# Patient Record
Sex: Female | Born: 1950
Health system: Southern US, Community
[De-identification: ages and names within clinical notes are randomized; demographics above are authoritative.]

## PROBLEM LIST (undated history)

## (undated) DIAGNOSIS — R319 Hematuria, unspecified: Secondary | ICD-10-CM

## (undated) DIAGNOSIS — R102 Pelvic and perineal pain: Secondary | ICD-10-CM

## (undated) DIAGNOSIS — L0100 Impetigo, unspecified: Secondary | ICD-10-CM

## (undated) DIAGNOSIS — M069 Rheumatoid arthritis, unspecified: Secondary | ICD-10-CM

## (undated) DIAGNOSIS — R1310 Dysphagia, unspecified: Secondary | ICD-10-CM

## (undated) DIAGNOSIS — F419 Anxiety disorder, unspecified: Secondary | ICD-10-CM

## (undated) DIAGNOSIS — R609 Edema, unspecified: Secondary | ICD-10-CM

## (undated) DIAGNOSIS — F329 Major depressive disorder, single episode, unspecified: Secondary | ICD-10-CM

## (undated) DIAGNOSIS — K5900 Constipation, unspecified: Secondary | ICD-10-CM

## (undated) DIAGNOSIS — I639 Cerebral infarction, unspecified: Secondary | ICD-10-CM

## (undated) DIAGNOSIS — F32A Depression, unspecified: Secondary | ICD-10-CM

## (undated) DIAGNOSIS — M1990 Unspecified osteoarthritis, unspecified site: Secondary | ICD-10-CM

## (undated) DIAGNOSIS — I10 Essential (primary) hypertension: Secondary | ICD-10-CM

## (undated) DIAGNOSIS — J309 Allergic rhinitis, unspecified: Secondary | ICD-10-CM

## (undated) DIAGNOSIS — J45909 Unspecified asthma, uncomplicated: Secondary | ICD-10-CM

## (undated) DIAGNOSIS — E785 Hyperlipidemia, unspecified: Secondary | ICD-10-CM

## (undated) DIAGNOSIS — K829 Disease of gallbladder, unspecified: Secondary | ICD-10-CM

## (undated) DIAGNOSIS — E669 Obesity, unspecified: Secondary | ICD-10-CM

## (undated) DIAGNOSIS — T7840XA Allergy, unspecified, initial encounter: Secondary | ICD-10-CM

## (undated) HISTORY — DX: Unspecified osteoarthritis, unspecified site: M19.90

## (undated) HISTORY — DX: Constipation, unspecified: K59.00

## (undated) HISTORY — PX: JOINT REPLACEMENT: SHX530

## (undated) HISTORY — DX: Edema, unspecified: R60.9

## (undated) HISTORY — DX: Disease of gallbladder, unspecified: K82.9

## (undated) HISTORY — DX: Allergy, unspecified, initial encounter: T78.40XA

## (undated) HISTORY — DX: Impetigo, unspecified: L01.00

## (undated) HISTORY — DX: Hyperlipidemia, unspecified: E78.5

## (undated) HISTORY — PX: BACK SURGERY: SHX140

## (undated) HISTORY — DX: Rheumatoid arthritis, unspecified: M06.9

## (undated) HISTORY — PX: TONSILLECTOMY AND ADENOIDECTOMY: SUR1326

## (undated) HISTORY — DX: Cerebral infarction, unspecified: I63.9

## (undated) HISTORY — DX: Major depressive disorder, single episode, unspecified: F32.9

## (undated) HISTORY — PX: LIPOMA EXCISION: SHX5283

## (undated) HISTORY — DX: Allergic rhinitis, unspecified: J30.9

## (undated) HISTORY — DX: Obesity, unspecified: E66.9

## (undated) HISTORY — PX: EYE SURGERY: SHX253

## (undated) HISTORY — PX: CHOLECYSTECTOMY: SHX55

## (undated) HISTORY — DX: Unspecified asthma, uncomplicated: J45.909

## (undated) HISTORY — DX: Essential (primary) hypertension: I10

## (undated) HISTORY — DX: Pelvic and perineal pain: R10.2

## (undated) HISTORY — PX: SPINE SURGERY: SHX786

## (undated) HISTORY — PX: KNEE ARTHROPLASTY: SHX992

## (undated) HISTORY — DX: Dysphagia, unspecified: R13.10

## (undated) HISTORY — DX: Hematuria, unspecified: R31.9

## (undated) HISTORY — DX: Depression, unspecified: F32.A

## (undated) HISTORY — PX: KNEE SURGERY: SHX244

## (undated) HISTORY — DX: Anxiety disorder, unspecified: F41.9

---

## 2005-08-21 HISTORY — PX: COLONOSCOPY: SHX174

## 2007-02-21 ENCOUNTER — Ambulatory Visit: Payer: Self-pay | Admitting: Family Medicine

## 2007-02-21 DIAGNOSIS — J309 Allergic rhinitis, unspecified: Secondary | ICD-10-CM

## 2007-02-21 DIAGNOSIS — E785 Hyperlipidemia, unspecified: Secondary | ICD-10-CM | POA: Insufficient documentation

## 2007-02-21 DIAGNOSIS — M25569 Pain in unspecified knee: Secondary | ICD-10-CM | POA: Insufficient documentation

## 2007-02-21 DIAGNOSIS — D179 Benign lipomatous neoplasm, unspecified: Secondary | ICD-10-CM | POA: Insufficient documentation

## 2007-02-21 DIAGNOSIS — M545 Low back pain, unspecified: Secondary | ICD-10-CM | POA: Insufficient documentation

## 2007-02-21 DIAGNOSIS — J45909 Unspecified asthma, uncomplicated: Secondary | ICD-10-CM | POA: Insufficient documentation

## 2007-02-21 DIAGNOSIS — M129 Arthropathy, unspecified: Secondary | ICD-10-CM | POA: Insufficient documentation

## 2007-02-25 ENCOUNTER — Telehealth (INDEPENDENT_AMBULATORY_CARE_PROVIDER_SITE_OTHER): Payer: Self-pay | Admitting: *Deleted

## 2007-02-28 ENCOUNTER — Ambulatory Visit (HOSPITAL_COMMUNITY): Admission: RE | Admit: 2007-02-28 | Discharge: 2007-02-28 | Payer: Self-pay | Admitting: Family Medicine

## 2007-03-01 ENCOUNTER — Telehealth (INDEPENDENT_AMBULATORY_CARE_PROVIDER_SITE_OTHER): Payer: Self-pay | Admitting: *Deleted

## 2007-03-07 ENCOUNTER — Ambulatory Visit: Payer: Self-pay | Admitting: Family Medicine

## 2007-03-07 DIAGNOSIS — M159 Polyosteoarthritis, unspecified: Secondary | ICD-10-CM

## 2007-03-07 DIAGNOSIS — R439 Unspecified disturbances of smell and taste: Secondary | ICD-10-CM

## 2007-03-07 DIAGNOSIS — L409 Psoriasis, unspecified: Secondary | ICD-10-CM | POA: Insufficient documentation

## 2007-03-08 ENCOUNTER — Encounter (INDEPENDENT_AMBULATORY_CARE_PROVIDER_SITE_OTHER): Payer: Self-pay | Admitting: Family Medicine

## 2007-03-10 LAB — CONVERTED CEMR LAB
ALT: 20 units/L (ref 0–35)
Albumin: 4.1 g/dL (ref 3.5–5.2)
BUN: 12 mg/dL (ref 6–23)
CRP: 0.6 mg/dL — ABNORMAL HIGH (ref <0.6)
Calcium: 10.3 mg/dL (ref 8.4–10.5)
Chloride: 108 meq/L (ref 96–112)
Cholesterol: 291 mg/dL — ABNORMAL HIGH (ref 0–200)
Eosinophils Relative: 3 % (ref 0–5)
Glucose, Bld: 81 mg/dL (ref 70–99)
HCT: 43.9 % (ref 36.0–46.0)
Hemoglobin: 14.4 g/dL (ref 12.0–15.0)
LDL Cholesterol: 232 mg/dL — ABNORMAL HIGH (ref 0–99)
Lymphocytes Relative: 26 % (ref 12–46)
MCV: 90.3 fL (ref 78.0–100.0)
Monocytes Absolute: 0.4 10*3/uL (ref 0.2–0.7)
Neutro Abs: 3.5 10*3/uL (ref 1.7–7.7)
Platelets: 265 10*3/uL (ref 150–400)
Potassium: 4.5 meq/L (ref 3.5–5.3)
RBC: 4.86 M/uL (ref 3.87–5.11)
Rhuematoid fact SerPl-aCnc: 410 intl units/mL — ABNORMAL HIGH (ref 0–20)
Sodium: 141 meq/L (ref 135–145)
TSH: 1.613 microintl units/mL (ref 0.350–5.50)
Total CHOL/HDL Ratio: 6.9
Triglycerides: 86 mg/dL (ref <150)
WBC: 5.6 10*3/uL (ref 4.0–10.5)

## 2007-03-11 ENCOUNTER — Telehealth (INDEPENDENT_AMBULATORY_CARE_PROVIDER_SITE_OTHER): Payer: Self-pay | Admitting: *Deleted

## 2007-03-11 ENCOUNTER — Encounter (INDEPENDENT_AMBULATORY_CARE_PROVIDER_SITE_OTHER): Payer: Self-pay | Admitting: Family Medicine

## 2007-03-26 ENCOUNTER — Encounter (INDEPENDENT_AMBULATORY_CARE_PROVIDER_SITE_OTHER): Payer: Self-pay | Admitting: Family Medicine

## 2007-04-04 ENCOUNTER — Ambulatory Visit: Payer: Self-pay | Admitting: Family Medicine

## 2007-04-04 ENCOUNTER — Encounter: Payer: Self-pay | Admitting: Family Medicine

## 2007-04-04 DIAGNOSIS — I10 Essential (primary) hypertension: Secondary | ICD-10-CM

## 2007-04-04 HISTORY — DX: Essential (primary) hypertension: I10

## 2007-04-04 LAB — CONVERTED CEMR LAB: HDL goal, serum: 40 mg/dL

## 2007-04-08 ENCOUNTER — Telehealth (INDEPENDENT_AMBULATORY_CARE_PROVIDER_SITE_OTHER): Payer: Self-pay | Admitting: *Deleted

## 2007-04-08 ENCOUNTER — Encounter (INDEPENDENT_AMBULATORY_CARE_PROVIDER_SITE_OTHER): Payer: Self-pay | Admitting: Family Medicine

## 2007-04-10 ENCOUNTER — Encounter (INDEPENDENT_AMBULATORY_CARE_PROVIDER_SITE_OTHER): Payer: Self-pay | Admitting: Family Medicine

## 2007-04-23 ENCOUNTER — Encounter (HOSPITAL_COMMUNITY): Admission: RE | Admit: 2007-04-23 | Discharge: 2007-05-21 | Payer: Self-pay | Admitting: Family Medicine

## 2007-05-16 ENCOUNTER — Ambulatory Visit: Payer: Self-pay | Admitting: Family Medicine

## 2007-05-16 DIAGNOSIS — K5900 Constipation, unspecified: Secondary | ICD-10-CM | POA: Insufficient documentation

## 2007-05-17 ENCOUNTER — Encounter (INDEPENDENT_AMBULATORY_CARE_PROVIDER_SITE_OTHER): Payer: Self-pay | Admitting: Family Medicine

## 2007-05-17 LAB — CONVERTED CEMR LAB
Albumin: 4.4 g/dL (ref 3.5–5.2)
Chloride: 105 meq/L (ref 96–112)
Potassium: 4.4 meq/L (ref 3.5–5.3)
Sodium: 139 meq/L (ref 135–145)

## 2007-05-24 ENCOUNTER — Encounter (HOSPITAL_COMMUNITY): Admission: RE | Admit: 2007-05-24 | Discharge: 2007-06-23 | Payer: Self-pay | Admitting: Family Medicine

## 2007-06-12 ENCOUNTER — Telehealth (INDEPENDENT_AMBULATORY_CARE_PROVIDER_SITE_OTHER): Payer: Self-pay | Admitting: Family Medicine

## 2007-06-14 ENCOUNTER — Telehealth (INDEPENDENT_AMBULATORY_CARE_PROVIDER_SITE_OTHER): Payer: Self-pay | Admitting: Family Medicine

## 2007-06-14 ENCOUNTER — Telehealth (INDEPENDENT_AMBULATORY_CARE_PROVIDER_SITE_OTHER): Payer: Self-pay | Admitting: *Deleted

## 2007-06-17 ENCOUNTER — Encounter (INDEPENDENT_AMBULATORY_CARE_PROVIDER_SITE_OTHER): Payer: Self-pay | Admitting: Family Medicine

## 2007-06-17 ENCOUNTER — Ambulatory Visit (HOSPITAL_COMMUNITY): Admission: RE | Admit: 2007-06-17 | Discharge: 2007-06-17 | Payer: Self-pay | Admitting: Family Medicine

## 2007-06-18 ENCOUNTER — Ambulatory Visit (HOSPITAL_COMMUNITY): Admission: RE | Admit: 2007-06-18 | Discharge: 2007-06-18 | Payer: Self-pay | Admitting: Family Medicine

## 2007-06-18 ENCOUNTER — Encounter (INDEPENDENT_AMBULATORY_CARE_PROVIDER_SITE_OTHER): Payer: Self-pay | Admitting: Family Medicine

## 2007-06-18 DIAGNOSIS — M25559 Pain in unspecified hip: Secondary | ICD-10-CM | POA: Insufficient documentation

## 2007-06-19 ENCOUNTER — Telehealth (INDEPENDENT_AMBULATORY_CARE_PROVIDER_SITE_OTHER): Payer: Self-pay | Admitting: *Deleted

## 2007-06-27 ENCOUNTER — Ambulatory Visit (HOSPITAL_COMMUNITY): Admission: RE | Admit: 2007-06-27 | Discharge: 2007-06-27 | Payer: Self-pay | Admitting: Family Medicine

## 2007-06-27 ENCOUNTER — Ambulatory Visit: Payer: Self-pay | Admitting: Family Medicine

## 2007-06-27 DIAGNOSIS — M85831 Other specified disorders of bone density and structure, right forearm: Secondary | ICD-10-CM | POA: Insufficient documentation

## 2007-06-27 DIAGNOSIS — M8580 Other specified disorders of bone density and structure, unspecified site: Secondary | ICD-10-CM | POA: Insufficient documentation

## 2007-06-27 DIAGNOSIS — M5136 Other intervertebral disc degeneration, lumbar region: Secondary | ICD-10-CM | POA: Insufficient documentation

## 2007-06-28 ENCOUNTER — Encounter (INDEPENDENT_AMBULATORY_CARE_PROVIDER_SITE_OTHER): Payer: Self-pay | Admitting: Family Medicine

## 2007-06-28 ENCOUNTER — Telehealth (INDEPENDENT_AMBULATORY_CARE_PROVIDER_SITE_OTHER): Payer: Self-pay | Admitting: *Deleted

## 2007-07-02 ENCOUNTER — Encounter (INDEPENDENT_AMBULATORY_CARE_PROVIDER_SITE_OTHER): Payer: Self-pay | Admitting: Family Medicine

## 2007-07-30 ENCOUNTER — Inpatient Hospital Stay (HOSPITAL_COMMUNITY): Admission: RE | Admit: 2007-07-30 | Discharge: 2007-08-02 | Payer: Self-pay | Admitting: Neurosurgery

## 2007-08-28 ENCOUNTER — Encounter (INDEPENDENT_AMBULATORY_CARE_PROVIDER_SITE_OTHER): Payer: Self-pay | Admitting: Family Medicine

## 2007-10-28 ENCOUNTER — Encounter (INDEPENDENT_AMBULATORY_CARE_PROVIDER_SITE_OTHER): Payer: Self-pay | Admitting: Family Medicine

## 2007-11-07 ENCOUNTER — Encounter (HOSPITAL_COMMUNITY): Admission: RE | Admit: 2007-11-07 | Discharge: 2007-12-07 | Payer: Self-pay | Admitting: Neurosurgery

## 2007-12-09 ENCOUNTER — Telehealth (INDEPENDENT_AMBULATORY_CARE_PROVIDER_SITE_OTHER): Payer: Self-pay | Admitting: Family Medicine

## 2007-12-19 ENCOUNTER — Telehealth (INDEPENDENT_AMBULATORY_CARE_PROVIDER_SITE_OTHER): Payer: Self-pay | Admitting: *Deleted

## 2007-12-19 ENCOUNTER — Ambulatory Visit: Payer: Self-pay | Admitting: Family Medicine

## 2007-12-19 DIAGNOSIS — E669 Obesity, unspecified: Secondary | ICD-10-CM | POA: Insufficient documentation

## 2007-12-24 ENCOUNTER — Ambulatory Visit (HOSPITAL_COMMUNITY): Admission: RE | Admit: 2007-12-24 | Discharge: 2007-12-24 | Payer: Self-pay | Admitting: Family Medicine

## 2007-12-24 ENCOUNTER — Encounter (INDEPENDENT_AMBULATORY_CARE_PROVIDER_SITE_OTHER): Payer: Self-pay | Admitting: Family Medicine

## 2007-12-26 ENCOUNTER — Telehealth (INDEPENDENT_AMBULATORY_CARE_PROVIDER_SITE_OTHER): Payer: Self-pay | Admitting: *Deleted

## 2008-01-15 ENCOUNTER — Encounter (INDEPENDENT_AMBULATORY_CARE_PROVIDER_SITE_OTHER): Payer: Self-pay | Admitting: Internal Medicine

## 2008-01-17 ENCOUNTER — Encounter (INDEPENDENT_AMBULATORY_CARE_PROVIDER_SITE_OTHER): Payer: Self-pay | Admitting: Family Medicine

## 2008-01-17 ENCOUNTER — Ambulatory Visit: Payer: Self-pay | Admitting: Family Medicine

## 2008-01-17 ENCOUNTER — Encounter: Payer: Self-pay | Admitting: Family Medicine

## 2008-01-17 LAB — CONVERTED CEMR LAB
ALT: 14 units/L (ref 0–35)
Albumin: 4.2 g/dL (ref 3.5–5.2)
Alkaline Phosphatase: 77 units/L (ref 39–117)
BUN: 18 mg/dL (ref 6–23)
CO2: 23 meq/L (ref 19–32)
Calcium: 9.7 mg/dL (ref 8.4–10.5)
Chloride: 107 meq/L (ref 96–112)
Cholesterol: 311 mg/dL — ABNORMAL HIGH (ref 0–200)
Creatinine, Ser: 0.69 mg/dL (ref 0.40–1.20)
Eosinophils Absolute: 0.2 10*3/uL (ref 0.0–0.7)
Glucose, Bld: 106 mg/dL — ABNORMAL HIGH (ref 70–99)
HCT: 43.6 % (ref 36.0–46.0)
Hemoglobin: 14.1 g/dL (ref 12.0–15.0)
Ketones, urine, test strip: NEGATIVE
Lymphocytes Relative: 26 % (ref 12–46)
Lymphs Abs: 1.5 10*3/uL (ref 0.7–4.0)
MCHC: 32.3 g/dL (ref 30.0–36.0)
Neutrophils Relative %: 63 % (ref 43–77)
Sodium: 141 meq/L (ref 135–145)
Total Bilirubin: 0.4 mg/dL (ref 0.3–1.2)
Total CHOL/HDL Ratio: 6.9
VLDL: 17 mg/dL (ref 0–40)
pH: 6.5

## 2008-01-23 ENCOUNTER — Encounter (INDEPENDENT_AMBULATORY_CARE_PROVIDER_SITE_OTHER): Payer: Self-pay | Admitting: Family Medicine

## 2008-02-06 ENCOUNTER — Telehealth (INDEPENDENT_AMBULATORY_CARE_PROVIDER_SITE_OTHER): Payer: Self-pay | Admitting: *Deleted

## 2008-02-27 ENCOUNTER — Telehealth (INDEPENDENT_AMBULATORY_CARE_PROVIDER_SITE_OTHER): Payer: Self-pay | Admitting: *Deleted

## 2008-02-28 ENCOUNTER — Ambulatory Visit: Payer: Self-pay | Admitting: Family Medicine

## 2008-02-29 ENCOUNTER — Encounter (INDEPENDENT_AMBULATORY_CARE_PROVIDER_SITE_OTHER): Payer: Self-pay | Admitting: Family Medicine

## 2008-03-03 LAB — CONVERTED CEMR LAB
BUN: 18 mg/dL (ref 6–23)
CO2: 21 meq/L (ref 19–32)
Chloride: 108 meq/L (ref 96–112)
Creatinine, Ser: 0.7 mg/dL (ref 0.40–1.20)
Glucose, Bld: 101 mg/dL — ABNORMAL HIGH (ref 70–99)
Total Protein: 6.7 g/dL (ref 6.0–8.3)

## 2008-04-08 ENCOUNTER — Telehealth (INDEPENDENT_AMBULATORY_CARE_PROVIDER_SITE_OTHER): Payer: Self-pay | Admitting: *Deleted

## 2008-04-16 ENCOUNTER — Ambulatory Visit: Payer: Self-pay | Admitting: Family Medicine

## 2008-04-16 LAB — CONVERTED CEMR LAB
Bilirubin Urine: NEGATIVE
Blood in Urine, dipstick: NEGATIVE
Specific Gravity, Urine: 1.01
pH: 5.5

## 2008-04-18 ENCOUNTER — Encounter (INDEPENDENT_AMBULATORY_CARE_PROVIDER_SITE_OTHER): Payer: Self-pay | Admitting: Family Medicine

## 2008-04-21 ENCOUNTER — Encounter (INDEPENDENT_AMBULATORY_CARE_PROVIDER_SITE_OTHER): Payer: Self-pay | Admitting: Family Medicine

## 2008-04-23 ENCOUNTER — Encounter (INDEPENDENT_AMBULATORY_CARE_PROVIDER_SITE_OTHER): Payer: Self-pay | Admitting: Family Medicine

## 2008-05-01 LAB — CONVERTED CEMR LAB
ALT: 10 U/L (ref 0–35)
AST: 12 U/L (ref 0–37)
Albumin: 4.1 g/dL (ref 3.5–5.2)
Alkaline Phosphatase: 69 U/L (ref 39–117)
BUN: 16 mg/dL (ref 6–23)
CO2: 25 meq/L (ref 19–32)
Calcium: 10.1 mg/dL (ref 8.4–10.5)
Chloride: 106 meq/L (ref 96–112)
Cholesterol: 224 mg/dL — ABNORMAL HIGH (ref 0–200)
Creatinine, Ser: 0.94 mg/dL (ref 0.40–1.20)
Glucose, Bld: 91 mg/dL (ref 70–99)
HDL: 43 mg/dL (ref >39)
LDL Cholesterol: 165 mg/dL — ABNORMAL HIGH (ref 0–99)
Potassium: 4.8 meq/L (ref 3.5–5.3)
Sodium: 140 meq/L (ref 135–145)
Total Bilirubin: 0.4 mg/dL (ref 0.3–1.2)
Total CHOL/HDL Ratio: 5.2
Total Protein: 7.1 g/dL (ref 6.0–8.3)
Triglycerides: 79 mg/dL (ref <150)
VLDL: 16 mg/dL (ref 0–40)

## 2008-05-08 ENCOUNTER — Other Ambulatory Visit: Admission: RE | Admit: 2008-05-08 | Discharge: 2008-05-08 | Payer: Self-pay | Admitting: Family Medicine

## 2008-05-08 ENCOUNTER — Ambulatory Visit: Payer: Self-pay | Admitting: Family Medicine

## 2008-05-08 LAB — CONVERTED CEMR LAB
Bilirubin Urine: NEGATIVE
Nitrite: NEGATIVE
Urobilinogen, UA: 0.2

## 2008-05-09 ENCOUNTER — Encounter (INDEPENDENT_AMBULATORY_CARE_PROVIDER_SITE_OTHER): Payer: Self-pay | Admitting: Family Medicine

## 2008-05-12 ENCOUNTER — Encounter (INDEPENDENT_AMBULATORY_CARE_PROVIDER_SITE_OTHER): Payer: Self-pay | Admitting: Family Medicine

## 2008-05-13 LAB — CONVERTED CEMR LAB: RBC / HPF: NONE SEEN (ref <3)

## 2008-05-21 ENCOUNTER — Encounter (INDEPENDENT_AMBULATORY_CARE_PROVIDER_SITE_OTHER): Payer: Self-pay | Admitting: Family Medicine

## 2008-07-02 ENCOUNTER — Ambulatory Visit (HOSPITAL_COMMUNITY): Admission: RE | Admit: 2008-07-02 | Discharge: 2008-07-02 | Payer: Self-pay | Admitting: Family Medicine

## 2008-07-02 ENCOUNTER — Ambulatory Visit: Payer: Self-pay | Admitting: Family Medicine

## 2008-08-05 ENCOUNTER — Encounter (INDEPENDENT_AMBULATORY_CARE_PROVIDER_SITE_OTHER): Payer: Self-pay | Admitting: Family Medicine

## 2008-08-07 ENCOUNTER — Ambulatory Visit: Payer: Self-pay | Admitting: Family Medicine

## 2008-08-07 DIAGNOSIS — F341 Dysthymic disorder: Secondary | ICD-10-CM | POA: Insufficient documentation

## 2008-08-07 LAB — CONVERTED CEMR LAB: LDL Goal: 100 mg/dL

## 2008-09-03 ENCOUNTER — Ambulatory Visit (HOSPITAL_COMMUNITY): Payer: Self-pay | Admitting: Psychiatry

## 2008-09-04 ENCOUNTER — Ambulatory Visit: Payer: Self-pay | Admitting: Family Medicine

## 2008-09-11 ENCOUNTER — Ambulatory Visit (HOSPITAL_COMMUNITY): Payer: Self-pay | Admitting: Psychiatry

## 2008-09-23 ENCOUNTER — Ambulatory Visit: Payer: Self-pay | Admitting: Family Medicine

## 2008-09-23 ENCOUNTER — Ambulatory Visit (HOSPITAL_COMMUNITY): Admission: RE | Admit: 2008-09-23 | Discharge: 2008-09-23 | Payer: Self-pay | Admitting: Family Medicine

## 2008-09-23 ENCOUNTER — Ambulatory Visit (HOSPITAL_COMMUNITY): Payer: Self-pay | Admitting: Psychiatry

## 2008-09-23 DIAGNOSIS — M1711 Unilateral primary osteoarthritis, right knee: Secondary | ICD-10-CM | POA: Insufficient documentation

## 2008-09-23 DIAGNOSIS — M79609 Pain in unspecified limb: Secondary | ICD-10-CM | POA: Insufficient documentation

## 2008-10-02 ENCOUNTER — Ambulatory Visit: Payer: Self-pay | Admitting: Family Medicine

## 2008-10-02 LAB — CONVERTED CEMR LAB: LDL Goal: 130 mg/dL

## 2008-10-03 ENCOUNTER — Encounter (INDEPENDENT_AMBULATORY_CARE_PROVIDER_SITE_OTHER): Payer: Self-pay | Admitting: Family Medicine

## 2008-10-05 LAB — CONVERTED CEMR LAB
ALT: 13 units/L (ref 0–35)
AST: 12 units/L (ref 0–37)
Basophils Absolute: 0 10*3/uL (ref 0.0–0.1)
Creatinine, Ser: 0.86 mg/dL (ref 0.40–1.20)
Eosinophils Absolute: 0.2 10*3/uL (ref 0.0–0.7)
Hemoglobin: 13.6 g/dL (ref 12.0–15.0)
MCHC: 32.4 g/dL (ref 30.0–36.0)
MCV: 92.1 fL (ref 78.0–100.0)
Monocytes Relative: 7 % (ref 3–12)
Neutro Abs: 5.7 10*3/uL (ref 1.7–7.7)
RBC: 4.56 M/uL (ref 3.87–5.11)
RDW: 13.8 % (ref 11.5–15.5)
Sodium: 140 meq/L (ref 135–145)
Total CHOL/HDL Ratio: 5.2
Total Protein: 6.9 g/dL (ref 6.0–8.3)
VLDL: 16 mg/dL (ref 0–40)
WBC: 7.9 10*3/uL (ref 4.0–10.5)

## 2008-10-13 ENCOUNTER — Ambulatory Visit (HOSPITAL_COMMUNITY): Payer: Self-pay | Admitting: Psychiatry

## 2008-10-27 ENCOUNTER — Ambulatory Visit (HOSPITAL_COMMUNITY): Payer: Self-pay | Admitting: Psychiatry

## 2008-11-10 ENCOUNTER — Ambulatory Visit (HOSPITAL_COMMUNITY): Payer: Self-pay | Admitting: Psychiatry

## 2008-12-04 ENCOUNTER — Ambulatory Visit: Payer: Self-pay | Admitting: Family Medicine

## 2008-12-04 ENCOUNTER — Telehealth (INDEPENDENT_AMBULATORY_CARE_PROVIDER_SITE_OTHER): Payer: Self-pay | Admitting: Family Medicine

## 2008-12-05 ENCOUNTER — Encounter (INDEPENDENT_AMBULATORY_CARE_PROVIDER_SITE_OTHER): Payer: Self-pay | Admitting: Family Medicine

## 2008-12-07 LAB — CONVERTED CEMR LAB
CO2: 19 meq/L (ref 19–32)
Glucose, Bld: 97 mg/dL (ref 70–99)
Sodium: 138 meq/L (ref 135–145)
Total Bilirubin: 0.2 mg/dL — ABNORMAL LOW (ref 0.3–1.2)
Total Protein: 6.8 g/dL (ref 6.0–8.3)

## 2008-12-09 ENCOUNTER — Encounter (INDEPENDENT_AMBULATORY_CARE_PROVIDER_SITE_OTHER): Payer: Self-pay | Admitting: Family Medicine

## 2008-12-10 ENCOUNTER — Ambulatory Visit (HOSPITAL_COMMUNITY): Payer: Self-pay | Admitting: Psychiatry

## 2008-12-18 ENCOUNTER — Encounter (INDEPENDENT_AMBULATORY_CARE_PROVIDER_SITE_OTHER): Payer: Self-pay | Admitting: Family Medicine

## 2008-12-25 ENCOUNTER — Ambulatory Visit (HOSPITAL_COMMUNITY): Payer: Self-pay | Admitting: Psychiatry

## 2009-01-02 ENCOUNTER — Encounter (INDEPENDENT_AMBULATORY_CARE_PROVIDER_SITE_OTHER): Payer: Self-pay | Admitting: Family Medicine

## 2009-01-12 ENCOUNTER — Telehealth (INDEPENDENT_AMBULATORY_CARE_PROVIDER_SITE_OTHER): Payer: Self-pay | Admitting: *Deleted

## 2009-01-28 ENCOUNTER — Encounter (INDEPENDENT_AMBULATORY_CARE_PROVIDER_SITE_OTHER): Payer: Self-pay | Admitting: Family Medicine

## 2009-02-08 ENCOUNTER — Encounter (INDEPENDENT_AMBULATORY_CARE_PROVIDER_SITE_OTHER): Payer: Self-pay | Admitting: Family Medicine

## 2009-02-10 ENCOUNTER — Ambulatory Visit (HOSPITAL_COMMUNITY): Admission: RE | Admit: 2009-02-10 | Discharge: 2009-02-10 | Payer: Self-pay | Admitting: Neurosurgery

## 2009-02-16 ENCOUNTER — Encounter (INDEPENDENT_AMBULATORY_CARE_PROVIDER_SITE_OTHER): Payer: Self-pay | Admitting: Family Medicine

## 2009-02-26 ENCOUNTER — Ambulatory Visit (HOSPITAL_COMMUNITY): Payer: Self-pay | Admitting: Psychiatry

## 2009-03-12 ENCOUNTER — Ambulatory Visit (HOSPITAL_COMMUNITY): Payer: Self-pay | Admitting: Psychiatry

## 2009-03-17 ENCOUNTER — Ambulatory Visit: Payer: Self-pay | Admitting: Family Medicine

## 2009-03-18 ENCOUNTER — Encounter (INDEPENDENT_AMBULATORY_CARE_PROVIDER_SITE_OTHER): Payer: Self-pay | Admitting: Family Medicine

## 2009-03-19 LAB — CONVERTED CEMR LAB
ALT: 18 units/L (ref 0–35)
CO2: 20 meq/L (ref 19–32)
Calcium: 10.4 mg/dL (ref 8.4–10.5)
Chloride: 103 meq/L (ref 96–112)
Cholesterol: 188 mg/dL (ref 0–200)
Eosinophils Absolute: 0.2 10*3/uL (ref 0.0–0.7)
Lymphs Abs: 1.6 10*3/uL (ref 0.7–4.0)
MCV: 88.8 fL (ref 78.0–100.0)
Monocytes Relative: 9 % (ref 3–12)
Neutro Abs: 4.5 10*3/uL (ref 1.7–7.7)
Neutrophils Relative %: 66 % (ref 43–77)
Potassium: 4.9 meq/L (ref 3.5–5.3)
RBC: 4.48 M/uL (ref 3.87–5.11)
Sodium: 137 meq/L (ref 135–145)
Total Protein: 7.2 g/dL (ref 6.0–8.3)
WBC: 6.8 10*3/uL (ref 4.0–10.5)

## 2009-03-26 ENCOUNTER — Ambulatory Visit (HOSPITAL_COMMUNITY): Payer: Self-pay | Admitting: Psychiatry

## 2009-04-16 ENCOUNTER — Ambulatory Visit: Payer: Self-pay | Admitting: Family Medicine

## 2009-04-16 DIAGNOSIS — D485 Neoplasm of uncertain behavior of skin: Secondary | ICD-10-CM | POA: Insufficient documentation

## 2009-05-03 ENCOUNTER — Ambulatory Visit (HOSPITAL_COMMUNITY): Payer: Self-pay | Admitting: Psychiatry

## 2009-05-18 ENCOUNTER — Ambulatory Visit (HOSPITAL_COMMUNITY): Payer: Self-pay | Admitting: Psychiatry

## 2009-07-02 ENCOUNTER — Ambulatory Visit (HOSPITAL_COMMUNITY): Payer: Self-pay | Admitting: Psychiatry

## 2009-07-19 ENCOUNTER — Ambulatory Visit (HOSPITAL_COMMUNITY): Payer: Self-pay | Admitting: Psychiatry

## 2009-08-09 ENCOUNTER — Ambulatory Visit (HOSPITAL_COMMUNITY): Payer: Self-pay | Admitting: Psychiatry

## 2009-08-30 ENCOUNTER — Ambulatory Visit (HOSPITAL_COMMUNITY): Payer: Self-pay | Admitting: Psychiatry

## 2009-10-20 ENCOUNTER — Ambulatory Visit (HOSPITAL_COMMUNITY): Payer: Self-pay | Admitting: Psychiatry

## 2009-11-15 ENCOUNTER — Inpatient Hospital Stay (HOSPITAL_COMMUNITY): Admission: RE | Admit: 2009-11-15 | Discharge: 2009-11-18 | Payer: Self-pay | Admitting: Orthopedic Surgery

## 2009-12-13 ENCOUNTER — Encounter (HOSPITAL_COMMUNITY): Admission: RE | Admit: 2009-12-13 | Discharge: 2010-01-12 | Payer: Self-pay | Admitting: Orthopedic Surgery

## 2010-02-28 ENCOUNTER — Inpatient Hospital Stay (HOSPITAL_COMMUNITY): Admission: RE | Admit: 2010-02-28 | Discharge: 2010-03-03 | Payer: Self-pay | Admitting: Orthopedic Surgery

## 2010-06-07 ENCOUNTER — Ambulatory Visit (HOSPITAL_COMMUNITY): Payer: Self-pay | Admitting: Psychiatry

## 2010-06-21 ENCOUNTER — Ambulatory Visit (HOSPITAL_COMMUNITY): Payer: Self-pay | Admitting: Psychiatry

## 2010-07-06 ENCOUNTER — Ambulatory Visit (HOSPITAL_COMMUNITY): Payer: Self-pay | Admitting: Psychiatry

## 2010-07-22 ENCOUNTER — Ambulatory Visit (HOSPITAL_COMMUNITY): Payer: Self-pay | Admitting: Psychiatry

## 2010-08-03 ENCOUNTER — Ambulatory Visit (HOSPITAL_COMMUNITY): Payer: Self-pay | Admitting: Psychiatry

## 2010-09-11 ENCOUNTER — Encounter: Payer: Self-pay | Admitting: Family Medicine

## 2010-11-06 LAB — TYPE AND SCREEN
ABO/RH(D): O POS
Antibody Screen: NEGATIVE

## 2010-11-06 LAB — CBC
HCT: 25.2 % — ABNORMAL LOW (ref 36.0–46.0)
HCT: 34.5 % — ABNORMAL LOW (ref 36.0–46.0)
Hemoglobin: 9.6 g/dL — ABNORMAL LOW (ref 12.0–15.0)
Hemoglobin: 12 g/dL (ref 12.0–15.0)
MCH: 30.3 pg (ref 26.0–34.0)
MCV: 87.4 fL (ref 78.0–100.0)
MCV: 88 fL (ref 78.0–100.0)
MCV: 87.1 fL (ref 78.0–100.0)
Platelets: 193 10*3/uL (ref 150–400)
Platelets: 206 10*3/uL (ref 150–400)
Platelets: 279 10*3/uL (ref 150–400)
RBC: 2.86 MIL/uL — ABNORMAL LOW (ref 3.87–5.11)
RBC: 3.16 MIL/uL — ABNORMAL LOW (ref 3.87–5.11)
RBC: 3.25 MIL/uL — ABNORMAL LOW (ref 3.87–5.11)
RBC: 3.96 MIL/uL (ref 3.87–5.11)
RDW: 15.3 % (ref 11.5–15.5)
WBC: 5.6 10*3/uL (ref 4.0–10.5)
WBC: 6.4 10*3/uL (ref 4.0–10.5)
WBC: 7.2 10*3/uL (ref 4.0–10.5)
WBC: 5.7 10*3/uL (ref 4.0–10.5)

## 2010-11-06 LAB — PREPARE RBC (CROSSMATCH)

## 2010-11-06 LAB — URINALYSIS, ROUTINE W REFLEX MICROSCOPIC
Bilirubin Urine: NEGATIVE
Hgb urine dipstick: NEGATIVE
Ketones, ur: NEGATIVE mg/dL
Nitrite: NEGATIVE
Protein, ur: NEGATIVE mg/dL
Specific Gravity, Urine: 1.009 (ref 1.005–1.030)
Urobilinogen, UA: 0.2 mg/dL (ref 0.0–1.0)

## 2010-11-06 LAB — SURGICAL PCR SCREEN
MRSA, PCR: NEGATIVE
Staphylococcus aureus: NEGATIVE

## 2010-11-06 LAB — BASIC METABOLIC PANEL
Chloride: 101 mEq/L (ref 96–112)
Chloride: 103 mEq/L (ref 96–112)
Creatinine, Ser: 0.53 mg/dL (ref 0.4–1.2)
GFR calc Af Amer: >60 mL/min (ref >60)
GFR calc Af Amer: >60 mL/min (ref >60)
GFR calc Af Amer: >60 mL/min (ref >60)
GFR calc non Af Amer: >60 mL/min (ref >60)
GFR calc non Af Amer: >60 mL/min (ref >60)
GFR calc non Af Amer: >60 mL/min (ref >60)
Potassium: 4.6 mEq/L (ref 3.5–5.1)
Potassium: 4.6 mEq/L (ref 3.5–5.1)
Sodium: 135 mEq/L (ref 135–145)

## 2010-11-06 LAB — COMPREHENSIVE METABOLIC PANEL
Albumin: 3.7 g/dL (ref 3.5–5.2)
Alkaline Phosphatase: 58 U/L (ref 39–117)
BUN: 12 mg/dL (ref 6–23)
CO2: 28 mEq/L (ref 19–32)
Chloride: 103 mEq/L (ref 96–112)
Creatinine, Ser: 0.76 mg/dL (ref 0.4–1.2)
GFR calc non Af Amer: >60 mL/min (ref >60)
Potassium: 4.6 mEq/L (ref 3.5–5.1)
Total Bilirubin: 0.8 mg/dL (ref 0.3–1.2)

## 2010-11-06 LAB — PROTIME-INR
INR: 1.23 (ref 0.00–1.49)
INR: 1.72 — ABNORMAL HIGH (ref 0.00–1.49)
INR: 1.75 — ABNORMAL HIGH (ref 0.00–1.49)
INR: 1.1 (ref 0.00–1.49)
Prothrombin Time: 15.4 seconds — ABNORMAL HIGH (ref 11.6–15.2)
Prothrombin Time: 20.3 seconds — ABNORMAL HIGH (ref 11.6–15.2)

## 2010-11-06 LAB — APTT: aPTT: 32 seconds (ref 24–37)

## 2010-11-14 LAB — COMPREHENSIVE METABOLIC PANEL
ALT: 27 U/L (ref 0–35)
AST: 23 U/L (ref 0–37)
Alkaline Phosphatase: 68 U/L (ref 39–117)
CO2: 29 mEq/L (ref 19–32)
Chloride: 107 mEq/L (ref 96–112)
GFR calc Af Amer: >60 mL/min (ref >60)
GFR calc non Af Amer: >60 mL/min (ref >60)
Glucose, Bld: 107 mg/dL — ABNORMAL HIGH (ref 70–99)
Potassium: 4.8 mEq/L (ref 3.5–5.1)
Sodium: 141 mEq/L (ref 135–145)
Total Bilirubin: 0.5 mg/dL (ref 0.3–1.2)

## 2010-11-14 LAB — CBC
HCT: 23.3 % — ABNORMAL LOW (ref 36.0–46.0)
HCT: 26.3 % — ABNORMAL LOW (ref 36.0–46.0)
HCT: 28.6 % — ABNORMAL LOW (ref 36.0–46.0)
Hemoglobin: 12.2 g/dL (ref 12.0–15.0)
Hemoglobin: 8.9 g/dL — ABNORMAL LOW (ref 12.0–15.0)
Hemoglobin: 9.7 g/dL — ABNORMAL LOW (ref 12.0–15.0)
MCHC: 33.7 g/dL (ref 30.0–36.0)
MCHC: 33.8 g/dL (ref 30.0–36.0)
MCV: 87.1 fL (ref 78.0–100.0)
MCV: 87.8 fL (ref 78.0–100.0)
MCV: 87.8 fL (ref 78.0–100.0)
Platelets: 201 10*3/uL (ref 150–400)
RBC: 2.65 MIL/uL — ABNORMAL LOW (ref 3.87–5.11)
RBC: 2.99 MIL/uL — ABNORMAL LOW (ref 3.87–5.11)
RBC: 3.29 MIL/uL — ABNORMAL LOW (ref 3.87–5.11)
RBC: 4.19 MIL/uL (ref 3.87–5.11)
WBC: 5.2 10*3/uL (ref 4.0–10.5)
WBC: 6.4 10*3/uL (ref 4.0–10.5)

## 2010-11-14 LAB — BASIC METABOLIC PANEL
BUN: 6 mg/dL (ref 6–23)
CO2: 28 mEq/L (ref 19–32)
CO2: 29 mEq/L (ref 19–32)
Calcium: 9.4 mg/dL (ref 8.4–10.5)
Chloride: 102 mEq/L (ref 96–112)
Chloride: 97 mEq/L (ref 96–112)
GFR calc Af Amer: >60 mL/min (ref >60)
GFR calc Af Amer: >60 mL/min (ref >60)
Potassium: 4.2 mEq/L (ref 3.5–5.1)
Potassium: 4.8 mEq/L (ref 3.5–5.1)
Sodium: 135 mEq/L (ref 135–145)
Sodium: 135 mEq/L (ref 135–145)

## 2010-11-14 LAB — URINALYSIS, ROUTINE W REFLEX MICROSCOPIC
Bilirubin Urine: NEGATIVE
Hgb urine dipstick: NEGATIVE
Ketones, ur: NEGATIVE mg/dL
Specific Gravity, Urine: 1.005 (ref 1.005–1.030)
Urobilinogen, UA: 0.2 mg/dL (ref 0.0–1.0)
pH: 6.5 (ref 5.0–8.0)

## 2010-11-14 LAB — URINE MICROSCOPIC-ADD ON

## 2010-11-14 LAB — TYPE AND SCREEN: ABO/RH(D): O POS

## 2010-11-14 LAB — PROTIME-INR
INR: 1.14 (ref 0.00–1.49)
Prothrombin Time: 13.2 seconds (ref 11.6–15.2)

## 2010-11-28 LAB — CREATININE, SERUM
Creatinine, Ser: 0.74 mg/dL (ref 0.4–1.2)
GFR calc non Af Amer: >60 mL/min (ref >60)

## 2011-01-03 NOTE — Op Note (Signed)
[name], [name]               ACCOUNT NO.:  [id number]   MEDICAL RECORD NO.:  [id number]          PATIENT TYPE:  INP   LOCATION:  3172                         FACILITY:  [facility]   PHYSICIAN:  Clydene Fake, M.D.  DATE OF BIRTH:  [birth year]   DATE OF PROCEDURE:  07/30/2007  DATE OF DISCHARGE:                               OPERATIVE REPORT   PREOPERATIVE DIAGNOSIS:  Lumbar stenosis, spondylolisthesis, spondylosis  at L4-5.   POSTOPERATIVE DIAGNOSIS:  Lumbar stenosis, spondylolisthesis,  spondylosis at L4-5.   OPERATIONS:  Decompression lam at L4 and L5, posterior lumbar interbody  fusion L4-5, Saber interbody cage at L4-5, non segmented Expedium  pedicle screw fixation L4-5, posterolateral fusion L4-5, autograft stab  incision, Infuse BMP.   SURGEON:  Clydene Fake, M.D.   Threasa HeadsMarisa Hua.   ANESTHESIA:  General endotracheal tube anesthesia.   ESTIMATED BLOOD LOSS:  350 mL.   BLOOD GIVEN:  135 Cell Saver returned.   COMPLICATIONS:  None.   INDICATIONS FOR PROCEDURE:  The patient is a 60 year old woman who has  had back, left hip and leg pain, trouble walking and MRI was done  showing high-grade lumbar stenosis, spondylolisthesis at L4-5 with  instability at that level.  The patient was brought in for decompression  and fusion.   DESCRIPTION OF PROCEDURE:  The patient was brought in the operating room  and general anesthesia induced.  He was placed in a Wilson frame with  all pressure points padded, prepped and draped in the usual sterile  fashion.  Site of incision was injected with 20 mL of 1% lidocaine with  epinephrine.  [name] incision was then made midline lower lumbar spine.  Incision taken down to fascia.  Hemostasis obtained with Bovie  cauterization.  The fascia was incised on the left side.  Subperiosteal  dissection was taken down over to spinous process, lamina to the facet  and marker was placed.  X-rays were obtained showing we were at the 3-4  interspace.  We then dissected caudally doing bilateral subperiosteal  dissection over the L4 and 5 spinous process and lamina out to the  facets and exposed the 4 and 5 transverse processes bilaterally.  Self-  retaining retractors were placed.  Markers were placed at the pedicles  and the 4-5 interspace.  Another x-ray was obtained confirming our  positioning.  Decompressive laminectomy was then performed.  Leksell  rongeur and Kerrison punches removing the top of the L5 lamina and  almost all of the L4 lamina and spinous process.  Facetectomy also  performed.  In doing this, we decompressed the 4 and 5 roots.  We did a  lateral decompression on the right all the way to the pedicles.  We then  explored the epidural space and disk space.  Large subligamentous disk  protrusion was there.  We incised the disk space.  Diskectomy was done  from both sides and we were able to remove a big center disk herniation.  This further decompressed the canal.  Diskectomy was then continued with  pituitary rongeurs and curettes.  We prepared the interspaces  by  distracting it up to 11 mm using broaches to remove cartilaginous  endplate using curettes to prepare the interspace for interbody fusion.  We then packed two 11.5 x 9 wide Saber interbody cages with Infuse BMP  and autograft bone.  All the bone that was removed during the  laminectomy was cleaned from its soft tissue, chopped up into small  pieces and this bone was used for the fusion.  We packed two cages.  We  packed bone into the interspace and we tapped cage on each side into the  4-5 interspace countersinking it some.  Good hemostasis was obtained  with bipolar cauterization, Gelfoam and thrombin.  We then decorticated  lateral facets at the transverse processes of L4 and 5 bilaterally for  our posterolateral fusion.  Using fluoroscopy and __________ landmarks  as a guide, we developed pedicle entry points for L4 and then L5.  We   decorticated with high speed drill.  First probed down the pedicle,  checked it with a small ball probe making sure we had bony  circumference, tapped the hole and then placed a screw.  We used 15 mm x  6 mm Expedium screws at the L4 level and 45 mm x 6 Expedium screws at  the L5 level.  We placed rods into the screw heads and placed the  locking mechanism, tightened those down.  Final AP and lateral images  were then obtained showing good position of the screws and rods,  __________  and good alignment of the spine at that 4-5 level.  Again  explored the nerve roots.  We had good decompression of the central  canal and the 4 and 5 roots.  We had good hemostasis.  The rest of the  Infuse BMP and autograft bone was packed in the posterolateral gutters  from L4 to 5 for posterolateral fusion.  Retractors removed.  Fascia  closed with 0 Vicryl interrupted sutures, subcutaneous tissue closed  with 2-0 and 3-0 Vicryl interrupted sutures, skin closed with Benzoin  and Steri-Strips.  Dressing was placed.  The patient was placed back in  the supine position, woken from anesthesia and returned to the recovery  room in stable condition.           ______________________________  Clydene Fake, M.D.     JRH/MEDQ  D:  07/30/2007  T:  07/30/2007  Job:  [id number]

## 2011-01-06 NOTE — Discharge Summary (Signed)
[name], [name]               ACCOUNT NO.:  [id number]   MEDICAL RECORD NO.:  [id number]          PATIENT TYPE:  INP   LOCATION:  3019                         FACILITY:  [facility]   PHYSICIAN:  Clydene Fake, M.D.  DATE OF BIRTH:  1950-11-05   DATE OF ADMISSION:  07/30/2007  DATE OF DISCHARGE:  08/02/2007                               DISCHARGE SUMMARY   ADMISSION DIAGNOSES:  Lumbar stenosis, unstable small pieces at L4-5.   DISCHARGE DIAGNOSIS:  Lumbar stenosis, unstable small pieces at L4-5.   PROCEDURE:  Depressive line at L4 and L5 with posterior lumbar interbody  fusion at 4-5 with Saber interbody cages Expedium pedicle screw fixation  and posterolateral fusion with autograft and Infuse BMP.   REASON FOR ADMISSION:  The patient is a 60 year old woman who has had  back, left hip, and left leg pain.  She was found to have stenosis not  improving with antiinflammatory medicines  therapy and was brought in  for decompression and fusion.   HOSPITAL COURSE:  The patient was admitted into Surgery and underwent  the above without complications.  On postoperative day, she was  transferred to the recovery room and then to the floor.  There, she was  doing well with no leg pain, and started ambulating the following day.  Incision was clean, dry, intact, and making progress.  PT and OT worked  with the patient to increase activity.  By September 02, 2007, she was  ambulating well.  She had a bowel movement.  She was eating well,  ambulating well.  No leg pain.  Incision remained clean, dry, and  intact.   CONDITION ON DISCHARGE:  She was discharged home in stable condition.   DISCHARGE MEDICATIONS:  Same as prehospitalization plus Percocet and  Flexeril as needed.   DISCHARGE INSTRUCTIONS:  Up with brace.   ACTIVITY:  No strenuous activity.   FOLLOWUP:  Follow up in 3-4 weeks in my office.           ______________________________  Clydene Fake, M.D.     JRH/MEDQ  D:   09/05/2007  T:  09/05/2007  Job:  [id number]

## 2011-05-04 ENCOUNTER — Other Ambulatory Visit: Payer: Self-pay | Admitting: Adult Health

## 2011-05-04 ENCOUNTER — Other Ambulatory Visit (HOSPITAL_COMMUNITY)
Admission: RE | Admit: 2011-05-04 | Discharge: 2011-05-04 | Disposition: A | Discharge status: Home / Self Care | Payer: Managed Care, Other (non HMO) | Source: Ambulatory Visit | Attending: Obstetrics and Gynecology | Admitting: Obstetrics and Gynecology

## 2011-05-04 DIAGNOSIS — Z01419 Encounter for gynecological examination (general) (routine) without abnormal findings: Secondary | ICD-10-CM | POA: Insufficient documentation

## 2011-05-29 LAB — BASIC METABOLIC PANEL
GFR calc non Af Amer: >60
Potassium: 4.6
Sodium: 140

## 2011-05-29 LAB — CBC
HCT: 42.3
Hemoglobin: 14.5
Platelets: 267
WBC: 4.7

## 2011-05-29 LAB — URINE MICROSCOPIC-ADD ON

## 2011-05-29 LAB — PROTIME-INR: INR: 0.9

## 2011-05-29 LAB — URINALYSIS, ROUTINE W REFLEX MICROSCOPIC
Ketones, ur: NEGATIVE
Nitrite: NEGATIVE
Specific Gravity, Urine: 1.018
Urobilinogen, UA: 0.2
pH: 5.5

## 2011-05-29 LAB — TYPE AND SCREEN

## 2011-05-29 LAB — ABO/RH: ABO/RH(D): O POS

## 2011-05-29 LAB — APTT: aPTT: 30

## 2012-07-31 ENCOUNTER — Encounter (HOSPITAL_BASED_OUTPATIENT_CLINIC_OR_DEPARTMENT_OTHER): Payer: Self-pay | Attending: General Surgery

## 2012-07-31 DIAGNOSIS — L97809 Non-pressure chronic ulcer of other part of unspecified lower leg with unspecified severity: Secondary | ICD-10-CM | POA: Insufficient documentation

## 2012-07-31 DIAGNOSIS — I87319 Chronic venous hypertension (idiopathic) with ulcer of unspecified lower extremity: Secondary | ICD-10-CM | POA: Insufficient documentation

## 2012-07-31 DIAGNOSIS — M25569 Pain in unspecified knee: Secondary | ICD-10-CM | POA: Insufficient documentation

## 2012-07-31 DIAGNOSIS — A4902 Methicillin resistant Staphylococcus aureus infection, unspecified site: Secondary | ICD-10-CM | POA: Insufficient documentation

## 2012-07-31 DIAGNOSIS — M549 Dorsalgia, unspecified: Secondary | ICD-10-CM | POA: Insufficient documentation

## 2012-07-31 DIAGNOSIS — Z7901 Long term (current) use of anticoagulants: Secondary | ICD-10-CM | POA: Insufficient documentation

## 2012-07-31 DIAGNOSIS — J45909 Unspecified asthma, uncomplicated: Secondary | ICD-10-CM | POA: Insufficient documentation

## 2012-07-31 DIAGNOSIS — I10 Essential (primary) hypertension: Secondary | ICD-10-CM | POA: Insufficient documentation

## 2012-07-31 DIAGNOSIS — L97909 Non-pressure chronic ulcer of unspecified part of unspecified lower leg with unspecified severity: Secondary | ICD-10-CM | POA: Insufficient documentation

## 2012-07-31 DIAGNOSIS — Z79899 Other long term (current) drug therapy: Secondary | ICD-10-CM | POA: Insufficient documentation

## 2012-08-01 NOTE — Progress Notes (Signed)
[facility]  NAME:  [name], [name]               ACCOUNT NO.:  [id number]  MEDICAL RECORD NO.:  [id number]      DATE OF BIRTH:  1951-02-26  PHYSICIAN:  Ardath Sax, M.D.      VISIT DATE:  07/31/2012                                  OFFICE VISIT   [name] comes to the wound clinic for the first time.  She is 61 years of age.  She is morbidly obese.  Weighs about 300 pounds.  She has a history of hypertension.  She is on lisinopril.  She also has a history of asthma and she takes Advair inhalers.  She also is on albuterol.  She is on Flexeril for back pain, oxycodone for back and knee pain.  She has had bilateral knees done.  She is on Lipitor, simvastatin.  She is also on Coumadin.  She has had many surgeries, laminectomies, gallbladder surgery.  She says that she has had arthritis for about 20 years.  On examination, she has a blood pressure of 129/83, respirations 20, pulse 74, temperature 97.8.  She weighs 275 pounds. She is 5 feet 5 inches tall.  She has an ulcer 2 cm in size on the anterior aspect of her left leg.  Apparently, it had a blister on it and it was debrided and cultured and it grew out MRSA and she has been on tetracyclines for about a month.  We are going to keep her on the same antibiotic and today we washed the wound and we are going to put on Unna boot with silver alginate over the wound.  This to me looks like a venous hypertension and a chronic venous ulcer on the anterior aspect of her left calf.  So, she will come back in a week.     Ardath Sax, M.D.     PP/MEDQ  D:  07/31/2012  T:  08/01/2012  Job:  [id number]

## 2012-08-23 ENCOUNTER — Encounter (HOSPITAL_BASED_OUTPATIENT_CLINIC_OR_DEPARTMENT_OTHER): Payer: Self-pay | Attending: General Surgery

## 2012-08-23 DIAGNOSIS — L97909 Non-pressure chronic ulcer of unspecified part of unspecified lower leg with unspecified severity: Secondary | ICD-10-CM | POA: Insufficient documentation

## 2012-08-23 DIAGNOSIS — I87319 Chronic venous hypertension (idiopathic) with ulcer of unspecified lower extremity: Secondary | ICD-10-CM | POA: Insufficient documentation

## 2012-08-28 ENCOUNTER — Encounter (HOSPITAL_BASED_OUTPATIENT_CLINIC_OR_DEPARTMENT_OTHER): Payer: Managed Care, Other (non HMO)

## 2012-09-25 ENCOUNTER — Encounter (HOSPITAL_BASED_OUTPATIENT_CLINIC_OR_DEPARTMENT_OTHER): Payer: Self-pay

## 2012-11-25 ENCOUNTER — Telehealth: Payer: Self-pay | Admitting: Family Medicine

## 2012-11-25 DIAGNOSIS — Z79899 Other long term (current) drug therapy: Secondary | ICD-10-CM

## 2012-11-25 DIAGNOSIS — E782 Mixed hyperlipidemia: Secondary | ICD-10-CM

## 2012-11-25 NOTE — Telephone Encounter (Signed)
Lipid liver and met-7

## 2012-11-25 NOTE — Telephone Encounter (Signed)
Nurse: 10:06AM  Message: Request for bloodwork paperwork (*) please call when paperwork is ready for pick-up.

## 2012-11-25 NOTE — Telephone Encounter (Signed)
Blood work ordered and printed out for patient to pick up per doctors orders. Patient notified.

## 2013-02-11 LAB — LIPID PANEL
Cholesterol: 286 mg/dL — ABNORMAL HIGH (ref 0–200)
HDL: 40 mg/dL (ref >39)
LDL Cholesterol: 230 mg/dL — ABNORMAL HIGH (ref 0–99)
Total CHOL/HDL Ratio: 7.2 Ratio
Triglycerides: 79 mg/dL (ref <150)

## 2013-02-11 LAB — BASIC METABOLIC PANEL
BUN: 16 mg/dL (ref 6–23)
Calcium: 9.7 mg/dL (ref 8.4–10.5)
Creat: 0.9 mg/dL (ref 0.50–1.10)

## 2013-02-11 LAB — HEPATIC FUNCTION PANEL
ALT: 22 U/L (ref 0–35)
AST: 20 U/L (ref 0–37)
Albumin: 3.8 g/dL (ref 3.5–5.2)
Alkaline Phosphatase: 66 U/L (ref 39–117)
Total Protein: 6.5 g/dL (ref 6.0–8.3)

## 2013-02-25 ENCOUNTER — Encounter: Payer: Self-pay | Admitting: *Deleted

## 2013-03-03 ENCOUNTER — Ambulatory Visit (INDEPENDENT_AMBULATORY_CARE_PROVIDER_SITE_OTHER): Payer: BC Managed Care – PPO | Admitting: Family Medicine

## 2013-03-03 ENCOUNTER — Encounter: Payer: Self-pay | Admitting: Family Medicine

## 2013-03-03 VITALS — BP 168/100 | HR 80 | Wt 282.6 lb

## 2013-03-03 DIAGNOSIS — I10 Essential (primary) hypertension: Secondary | ICD-10-CM

## 2013-03-03 DIAGNOSIS — E785 Hyperlipidemia, unspecified: Secondary | ICD-10-CM

## 2013-03-03 DIAGNOSIS — M159 Polyosteoarthritis, unspecified: Secondary | ICD-10-CM

## 2013-03-03 DIAGNOSIS — J45909 Unspecified asthma, uncomplicated: Secondary | ICD-10-CM

## 2013-03-03 MED ORDER — DICLOFENAC SODIUM ER 100 MG PO TB24: 100.0000 mg | ORAL_TABLET | Freq: Every day | ORAL | Status: DC

## 2013-03-03 MED ORDER — SIMVASTATIN 40 MG PO TABS: 40.0000 mg | ORAL_TABLET | Freq: Every evening | ORAL | Status: DC

## 2013-03-03 NOTE — Patient Instructions (Addendum)
Please call us a week before next visit for the blood work

## 2013-03-03 NOTE — Progress Notes (Signed)
  Subjective:    Patient ID: [name], female    DOB: 12/06/1950, 62 y.o.   MRN: [id number]  Hypertension This is a chronic problem. The current episode started more than 1 month ago. The problem has been waxing and waning since onset. The problem is controlled. Pertinent negatives include no anxiety, blurred vision, chest pain or headaches. There are no associated agents to hypertension. Risk factors for coronary artery disease include dyslipidemia. Past treatments include nothing. The current treatment provides mild improvement.    Results for orders placed in visit on 11/25/12  LIPID PANEL      Result Value Range   Cholesterol 286 (*) 0 - 200 mg/dL   Triglycerides 79  <409 mg/dL   HDL 40  >81 mg/dL   Total CHOL/HDL Ratio 7.2     VLDL 16  0 - 40 mg/dL   LDL Cholesterol 191 (*) 0 - 99 mg/dL  HEPATIC FUNCTION PANEL      Result Value Range   Total Bilirubin 0.5  0.3 - 1.2 mg/dL   Bilirubin, Direct 0.1  0.0 - 0.3 mg/dL   Indirect Bilirubin 0.4  0.0 - 0.9 mg/dL   Alkaline Phosphatase 66  39 - 117 U/L   AST 20  0 - 37 U/L   ALT 22  0 - 35 U/L   Total Protein 6.5  6.0 - 8.3 g/dL   Albumin 3.8  3.5 - 5.2 g/dL  BASIC METABOLIC PANEL      Result Value Range   Sodium 141  135 - 145 mEq/L   Potassium 4.7  3.5 - 5.3 mEq/L   Chloride 107  96 - 112 mEq/L   CO2 26  19 - 32 mEq/L   Glucose, Bld 101 (*) 70 - 99 mg/dL   BUN 16  6 - 23 mg/dL   Creat 4.78  2.95 - 6.21 mg/dL   Calcium 9.7  8.4 - 30.8 mg/dL   BP goes up and down, numbers vary.  High lipids in diet. Not exercising, so so going to the pool a couple times per wk. Has a challenge with hands. Has tried over-the-counter agents not helping. Dry cracked been uncomfortable. ROS otherwise negative. On feet moving around. Lipids have come off medication. Wheezing under good control. Uses inhaler only as needed and whiule wheezing. Notes arthritis is under fair control. Review of Systems  Eyes: Negative for blurred vision.   Cardiovascular: Negative for chest pain.  Neurological: Negative for headaches.       Objective:   Physical Exam  Alert no acute distress. HEENT slight nasal congestion. Vitals reviewed. Blood pressures suboptimum 142/92 on repeat lungs clear. Heart regular rate and rhythm. Hands multiple discrete crusty and thickened patches.     Assessment & Plan:  Impression #1 hyperlipidemia with noncompliance. #2 hypertension suboptimal in off meds. #3 asthma clinically stable on meds. #4 hand eczema discussed. #5 arthritis Plan resume statin rationale discussed. Diet exercise discussed in encourage. Triamcinolone ointment twice a day to affected area. Recheck in several months of blood work still op will initiate blood pressure medication. WSL

## 2013-03-03 NOTE — Progress Notes (Signed)
Shingles vaccine rx and info given to patient

## 2013-06-02 ENCOUNTER — Telehealth: Payer: Self-pay | Admitting: Family Medicine

## 2013-06-02 DIAGNOSIS — Z79899 Other long term (current) drug therapy: Secondary | ICD-10-CM

## 2013-06-02 DIAGNOSIS — E785 Hyperlipidemia, unspecified: Secondary | ICD-10-CM

## 2013-06-02 NOTE — Telephone Encounter (Signed)
Does patient need BW paperwork? °

## 2013-06-05 ENCOUNTER — Ambulatory Visit: Payer: BC Managed Care – PPO | Admitting: Family Medicine

## 2013-06-05 NOTE — Telephone Encounter (Signed)
Bloodwork ordered. Patient was notified.  

## 2013-06-05 NOTE — Telephone Encounter (Signed)
Lip liv fasting

## 2013-06-07 LAB — LIPID PANEL
HDL: 42 mg/dL (ref >39)
LDL Cholesterol: 135 mg/dL — ABNORMAL HIGH (ref 0–99)
Total CHOL/HDL Ratio: 4.5 Ratio
Triglycerides: 69 mg/dL (ref <150)
VLDL: 14 mg/dL (ref 0–40)

## 2013-06-07 LAB — HEPATIC FUNCTION PANEL
Albumin: 3.9 g/dL (ref 3.5–5.2)
Alkaline Phosphatase: 58 U/L (ref 39–117)
Total Protein: 6.5 g/dL (ref 6.0–8.3)

## 2013-06-17 ENCOUNTER — Ambulatory Visit (INDEPENDENT_AMBULATORY_CARE_PROVIDER_SITE_OTHER): Payer: BC Managed Care – PPO | Admitting: Family Medicine

## 2013-06-17 ENCOUNTER — Encounter: Payer: Self-pay | Admitting: Family Medicine

## 2013-06-17 VITALS — BP 128/88 | Ht 65.0 in | Wt 278.0 lb

## 2013-06-17 DIAGNOSIS — J309 Allergic rhinitis, unspecified: Secondary | ICD-10-CM

## 2013-06-17 DIAGNOSIS — J45909 Unspecified asthma, uncomplicated: Secondary | ICD-10-CM

## 2013-06-17 DIAGNOSIS — I10 Essential (primary) hypertension: Secondary | ICD-10-CM

## 2013-06-17 DIAGNOSIS — Z23 Encounter for immunization: Secondary | ICD-10-CM

## 2013-06-17 DIAGNOSIS — R110 Nausea: Secondary | ICD-10-CM

## 2013-06-17 DIAGNOSIS — E785 Hyperlipidemia, unspecified: Secondary | ICD-10-CM

## 2013-06-17 MED ORDER — BECLOMETHASONE DIPROPIONATE 40 MCG/ACT IN AERS: 2.0000 | INHALATION_SPRAY | Freq: Two times a day (BID) | RESPIRATORY_TRACT | Status: DC

## 2013-06-17 MED ORDER — ALBUTEROL SULFATE HFA 108 (90 BASE) MCG/ACT IN AERS: 2.0000 | INHALATION_SPRAY | Freq: Four times a day (QID) | RESPIRATORY_TRACT | Status: DC | PRN

## 2013-06-17 MED ORDER — SIMVASTATIN 40 MG PO TABS: 40.0000 mg | ORAL_TABLET | Freq: Every evening | ORAL | Status: AC

## 2013-06-17 NOTE — Progress Notes (Signed)
  Subjective:    Patient ID: [name], female    DOB: 10-15-1950, 62 y.o.   MRN: [id number]  HPIFollow up on bloodwork. No concerns.   Needs refill on ventolin inhaler and qvar inhale. Wheezing rare, rescue inhaler only twice in the last yr.  r and 90 day on simvastatin. Pt taking chol med, somewhat reluctantly. Next   Feel slightly nauseated. Feels it is 2 to her cholesterol medicine. No reflux. Good bowel habits.  Results for orders placed in visit on 06/02/13  LIPID PANEL      Result Value Range   Cholesterol 191  0 - 200 mg/dL   Triglycerides 69  <409 mg/dL   HDL 42  >81 mg/dL   Total CHOL/HDL Ratio 4.5     VLDL 14  0 - 40 mg/dL   LDL Cholesterol 191 (*) 0 - 99 mg/dL  HEPATIC FUNCTION PANEL      Result Value Range   Total Bilirubin 0.5  0.3 - 1.2 mg/dL   Bilirubin, Direct 0.1  0.0 - 0.3 mg/dL   Indirect Bilirubin 0.4  0.0 - 0.9 mg/dL   Alkaline Phosphatase 58  39 - 117 U/L   AST 15  0 - 37 U/L   ALT 17  0 - 35 U/L   Total Protein 6.5  6.0 - 8.3 g/dL   Albumin 3.9  3.5 - 5.2 g/dL   Tries to go to pool twice per wk. Overall diet is pretty good.      Review of Systems No chest pain no headache no back pain slight nausea at Zocor    Objective:   Physical Exam  Alert no apparent distress HEENT normal vitals stable lungs clear heart regular in rhythm      Assessment & Plan:  Impression 1 hyperlipidemia good control discussed #2 asthma clinically stable #3 nausea potentially related to Zocor patient would prefer not to change medication. plan flu shot today. Maintain same medicines. Diet exercise discussed in encourage recheck in 6 months. WSL

## 2013-07-31 ENCOUNTER — Telehealth: Payer: Self-pay | Admitting: Family Medicine

## 2013-07-31 ENCOUNTER — Other Ambulatory Visit: Payer: BC Managed Care – PPO | Admitting: Adult Health

## 2013-07-31 NOTE — Telephone Encounter (Signed)
pts Aunt (mother like figure) is in town from [place] and currently has what Mrs Deininger thinks may be Pneumonia   Binnie Kand, never been seen here at our office or in [place] area at all.   They want to know if you would be willing to see her one time or what  Would you suggest she do at this point, Mrs Ponti is very concerned.   Please call her anytime to advise

## 2013-07-31 NOTE — Telephone Encounter (Signed)
Patient notified and encouraged her aunt to go to urgent care or ER. Pt verbalized understanding.

## 2013-08-12 ENCOUNTER — Ambulatory Visit (INDEPENDENT_AMBULATORY_CARE_PROVIDER_SITE_OTHER): Payer: BC Managed Care – PPO | Admitting: Family Medicine

## 2013-08-12 ENCOUNTER — Encounter: Payer: Self-pay | Admitting: Family Medicine

## 2013-08-12 VITALS — BP 144/90 | Temp 98.5°F | Ht 64.0 in | Wt 273.0 lb

## 2013-08-12 DIAGNOSIS — J45909 Unspecified asthma, uncomplicated: Secondary | ICD-10-CM

## 2013-08-12 DIAGNOSIS — J209 Acute bronchitis, unspecified: Secondary | ICD-10-CM

## 2013-08-12 MED ORDER — LEVOFLOXACIN 500 MG PO TABS: 500.0000 mg | ORAL_TABLET | Freq: Every day | ORAL | Status: DC

## 2013-08-12 MED ORDER — PREDNISONE 20 MG PO TABS: ORAL_TABLET | ORAL | Status: AC

## 2013-08-12 NOTE — Patient Instructions (Signed)
Metered Dose Inhaler (No Spacer Used) Inhaled medicines are the basis of asthma treatment and other breathing problems. Inhaled medicine can only be effective if used properly. Good technique assures that the medicine reaches the lungs. Metered dose inhalers (MDIs) are used to deliver a variety of inhaled medicines. These include quick relief or rescue medicines (such as bronchodilators) and controller medicines (such as corticosteroids). The medicine is delivered by pushing down on a metal canister to release a set amount of spray. If you are using different kinds of inhalers, use your quick relief medicine to open the airways 10 15 minutes before using a steroid if instructed to do so by your health care provider. If you are unsure which inhalers to use and the order of using them, ask your health care provider, nurse, or respiratory therapist. HOW TO USE THE INHALER 1. Remove cap from inhaler. 2. If you are using the inhaler for the first time, you will need to prime it. Shake the inhaler for 5 seconds and release four puffs into the air, away from your face. Ask your health care provider or pharmacist if you have questions about priming your inhaler. 3. Shake inhaler for 5 seconds before each breath in (inhalation). 4. Position the inhaler so that the top of the canister faces up. 5. Put your index finger on the top of the medicine canister. Your thumb supports the bottom of the inhaler. 6. Open your mouth. 7. Either place the inhaler between your teeth and place your lips tightly around the mouthpiece, or hold the inhaler 1 2 inches away from your open mouth. If you are unsure of which technique to use, ask your health care provider. 8. Breathe out (exhale) normally and as completely as possible. 9. Press the canister down with the index finger to release the medicine. 10. At the same time as the canister is pressed, inhale deeply and slowly until the lungs are completely filled. This should take  4 6 seconds. Keep your tongue down. 11. Hold the medicine in your lungs for up to 5 10 seconds (10 seconds is best). This helps the medicine get into the small airways of your lungs. 12. Breathe out slowly, through pursed lips. Whistling is an example of pursed lips. 13. Wait at least 1 minute between puffs. Continue with the above steps until you have taken the number of puffs your health care provider has ordered. Do not use the inhaler more than your health care provider directs you to. 14. Replace cap on inhaler. 15. Follow the directions from your health care provider or the inhaler insert for cleaning the inhaler. If you are using a steroid inhaler, rinse your mouth with water after your last puff, gargle, and spit out the water. Do not swallow the water. AVOID:  Inhaling before or after starting the spray of medicine. It takes practice to coordinate your breathing with triggering the spray.  Inhaling through the nose (rather than the mouth) when triggering the spray. HOW TO DETERMINE IF YOUR INHALER IS FULL OR NEARLY EMPTY You cannot know when an inhaler is empty by shaking it. A few inhalers are now being made with dose counters. Ask your health care provider for a prescription that has a dose counter if you feel you need that extra help. If your inhaler does not have a counter, ask your health care provider to help you determine the date you need to refill your inhaler. Write the refill date on a calendar or your inhaler canister.   Refill your inhaler 7 10 days before it runs out. Be sure to keep an adequate supply of medicine. This includes making sure it is not expired, and you have a spare inhaler.  SEEK MEDICAL CARE IF:   Symptoms are only partially relieved with your inhaler.  You are having trouble using your inhaler.  You experience some increase in phlegm. SEEK IMMEDIATE MEDICAL CARE IF:   You feel little or no relief with your inhalers. You are still wheezing and are feeling  shortness of breath or tightness in your chest or both.  You have dizziness, headaches, or fast heart rate.  You have chills, fever, or night sweats.  There is a noticeable increase in phlegm production, or there is blood in the phlegm. Document Released: 06/04/2007 Document Revised: 04/09/2013 Document Reviewed: 01/23/2013 Exit[employer] Patient Information 2014 [employer].  

## 2013-08-12 NOTE — Progress Notes (Signed)
   Subjective:    Patient ID: [name], female    DOB: 09/14/1950, 62 y.o.   MRN: [id number]  Cough This is a new problem. The current episode started 1 to 4 weeks ago. Associated symptoms include postnasal drip and wheezing. She has tried steroid inhaler for the symptoms.   Started 2 weeks ago, now with flare of the asthma Low grade fever  no V Low energy, no sweats no chills PMH asthma rare flareup  Review of Systems  HENT: Positive for postnasal drip.   Respiratory: Positive for cough and wheezing.        Objective:   Physical Exam  Nursing note and vitals reviewed. Constitutional: She appears well-developed.  HENT:  Head: Normocephalic.  Nose: Nose normal.  Mouth/Throat: Oropharynx is clear and moist. No oropharyngeal exudate.  Neck: Neck supple.  Cardiovascular: Normal rate and normal heart sounds.   No murmur heard. Pulmonary/Chest: Effort normal and breath sounds normal. She has no wheezes.  Lymphadenopathy:    She has no cervical adenopathy.  Skin: Skin is warm and dry.          Assessment & Plan:  Sinusitis antibiotics prescribed warning signs discussed followup if problems, also prednisone and antibiotics.

## 2013-09-03 ENCOUNTER — Other Ambulatory Visit: Payer: Self-pay | Admitting: Family Medicine

## 2013-09-03 NOTE — Telephone Encounter (Signed)
Last office visit 08/12/13.

## 2013-10-27 ENCOUNTER — Encounter: Payer: Self-pay | Admitting: Family Medicine

## 2013-10-27 ENCOUNTER — Ambulatory Visit (INDEPENDENT_AMBULATORY_CARE_PROVIDER_SITE_OTHER): Payer: BC Managed Care – PPO | Admitting: Family Medicine

## 2013-10-27 VITALS — BP 132/86 | Ht 65.0 in | Wt 268.2 lb

## 2013-10-27 DIAGNOSIS — J310 Chronic rhinitis: Secondary | ICD-10-CM

## 2013-10-27 DIAGNOSIS — J329 Chronic sinusitis, unspecified: Secondary | ICD-10-CM

## 2013-10-27 MED ORDER — BENZONATATE 100 MG PO CAPS: 100.0000 mg | ORAL_CAPSULE | Freq: Three times a day (TID) | ORAL | Status: DC | PRN

## 2013-10-27 MED ORDER — LEVOFLOXACIN 500 MG PO TABS: 500.0000 mg | ORAL_TABLET | Freq: Every day | ORAL | Status: AC

## 2013-10-27 NOTE — Progress Notes (Signed)
   Subjective:    Patient ID: [name], female    DOB: [birth year], 63 y.o.   MRN: [id number]  Cough This is a new problem. The current episode started 1 to 4 weeks ago. Associated symptoms include nasal congestion. She has tried OTC cough suppressant for the symptoms.   severall days of arthritic prodrome  Then the cough  Sinus stuff later  Cough is dry now Productive earlyon  Gunky  Using delsyn prn dry hack cough,  Sig coughing at night  No sig drainage from the nose now Keflex took rx for 7 days, not better   Review of Systems  Respiratory: Positive for cough.    No vomiting no diarrhea no rash    Objective:   Physical Exam Alert hydration good moderate malaise. Nasal congestion frontal tenderness evident. Pharynx normal neck supple. Lungs bronchial cough during exam no wheezes no crackles no tachypnea heart regular in rhythm.       Assessment & Plan:  Impression post viral rhinosinusitis/bronchitis discussed plan Levaquin daily 10 days. Tessalon Perles when necessary. Warning signs discussed. WSL

## 2014-06-22 ENCOUNTER — Ambulatory Visit (INDEPENDENT_AMBULATORY_CARE_PROVIDER_SITE_OTHER): Payer: BC Managed Care – PPO | Admitting: Family Medicine

## 2014-06-22 ENCOUNTER — Encounter: Payer: Self-pay | Admitting: Family Medicine

## 2014-06-22 VITALS — BP 130/80 | Ht 65.0 in

## 2014-06-22 DIAGNOSIS — Z79899 Other long term (current) drug therapy: Secondary | ICD-10-CM

## 2014-06-22 DIAGNOSIS — I10 Essential (primary) hypertension: Secondary | ICD-10-CM

## 2014-06-22 DIAGNOSIS — E782 Mixed hyperlipidemia: Secondary | ICD-10-CM

## 2014-06-22 DIAGNOSIS — L309 Dermatitis, unspecified: Secondary | ICD-10-CM

## 2014-06-22 DIAGNOSIS — J4521 Mild intermittent asthma with (acute) exacerbation: Secondary | ICD-10-CM

## 2014-06-22 DIAGNOSIS — M79641 Pain in right hand: Secondary | ICD-10-CM

## 2014-06-22 MED ORDER — TRIAMCINOLONE ACETONIDE 0.1 % EX CREA: 1.0000 "application " | TOPICAL_CREAM | Freq: Two times a day (BID) | CUTANEOUS | Status: DC

## 2014-06-22 NOTE — Progress Notes (Signed)
   Subjective:    Patient ID: [name], female    DOB: [birth year], 63 y.o.   MRN: [id number] Patient arrives office with tremendous number of concerns Hypertension This is a chronic problem. The current episode started more than 1 year ago. The problem is unchanged. There are no associated agents to hypertension. There are no known risk factors for coronary artery disease. Past treatments include nothing. The current treatment provides mild improvement. There are no compliance problems.    Patient wants to discuss arthritis and wants a referral to a podiatrist about her feet.   No sig asthma symptoms at this timenot using her inhalers any.  Patient has developed palmaris rash. Pruritic. Thickened. History of eczema of hands  Flu shot alreayt given  Takes three fish oil caps per day  [facility]  Chol med was causing acheyness  Not on prav 40any longer. Patient realizes she has serious cholesterol problem.  Now off bp meds    Need to do blood work  Using [employer] for arthritis, helps relatively well.  Hand and feet notes swelling and pain and discomfort  Not sure of arthritis hx in family.next  Patient has developed progressive deformity in her hands. Right more than left. Accompanied by swelling.  Patient also requesting podiatry referral. History of congenital malformation and toes. Now causing difficulty with walking and compression with feet.  [id number] Review of Systems No headache no chest pain no abdominal pain slight shortness of breath no nausea no diaphoresis no change in bowel habits    Objective:   Physical Exam  Alert obesity present HEENT normal. Lungs clear. Heart regular rate and rhythm. Blood pressure good on repeat 128/82. Right hand chronic eczema changes noted. Metatarsal-phalangeal joint some swelling question ulnar deviation. Right hand more than left. Ankles trace edema.      Assessment & Plan:  Impression 1 hyperlipidemia  status uncertain and noncompliance having run out of medication. #2 hypertension good control without medications. #3 chronic hand eczema discussed. #4 asthma clinically stable this time. #5 arthritis with question rheumatoid arthritis type changes in right hand. #6 chronic foot deformity plan podiatry referral. Appropriate screening blood work. Rheumatoid factor sedimentation rate. X-ray hand. Times on cream for dermatitis. Recommend check in 6 months. 40 minutes spent most and protracted discussion. WS L

## 2014-07-09 ENCOUNTER — Ambulatory Visit (HOSPITAL_COMMUNITY)
Admission: RE | Admit: 2014-07-09 | Discharge: 2014-07-09 | Disposition: A | Discharge status: Home / Self Care | Payer: BC Managed Care – PPO | Source: Ambulatory Visit | Attending: Family Medicine | Admitting: Family Medicine

## 2014-07-09 DIAGNOSIS — M25531 Pain in right wrist: Secondary | ICD-10-CM | POA: Diagnosis not present

## 2014-07-09 DIAGNOSIS — M79641 Pain in right hand: Secondary | ICD-10-CM | POA: Diagnosis present

## 2014-07-09 LAB — HEPATIC FUNCTION PANEL
ALK PHOS: 60 U/L (ref 39–117)
ALT: 14 U/L (ref 0–35)
AST: 14 U/L (ref 0–37)
Albumin: 3.8 g/dL (ref 3.5–5.2)
BILIRUBIN DIRECT: 0.1 mg/dL (ref 0.0–0.3)
BILIRUBIN TOTAL: 0.5 mg/dL (ref 0.2–1.2)
Indirect Bilirubin: 0.4 mg/dL (ref 0.2–1.2)
Total Protein: 6.5 g/dL (ref 6.0–8.3)

## 2014-07-09 LAB — RHEUMATOID FACTOR: Rhuematoid fact SerPl-aCnc: 257 IU/mL — ABNORMAL HIGH (ref <=14)

## 2014-07-09 LAB — BASIC METABOLIC PANEL
BUN: 17 mg/dL (ref 6–23)
CHLORIDE: 106 meq/L (ref 96–112)
CO2: 25 meq/L (ref 19–32)
Calcium: 10.1 mg/dL (ref 8.4–10.5)
Creat: 0.73 mg/dL (ref 0.50–1.10)
Glucose, Bld: 110 mg/dL — ABNORMAL HIGH (ref 70–99)
Potassium: 4.3 mEq/L (ref 3.5–5.3)
SODIUM: 141 meq/L (ref 135–145)

## 2014-07-09 LAB — LIPID PANEL
CHOL/HDL RATIO: 6.6 ratio
Cholesterol: 278 mg/dL — ABNORMAL HIGH (ref 0–200)
HDL: 42 mg/dL (ref >39)
LDL CALC: 223 mg/dL — AB (ref 0–99)
Triglycerides: 65 mg/dL (ref <150)
VLDL: 13 mg/dL (ref 0–40)

## 2014-07-10 LAB — SEDIMENTATION RATE: Sed Rate: 22 mm/hr (ref 0–22)

## 2014-07-20 ENCOUNTER — Ambulatory Visit (INDEPENDENT_AMBULATORY_CARE_PROVIDER_SITE_OTHER): Payer: BC Managed Care – PPO | Admitting: Family Medicine

## 2014-07-20 ENCOUNTER — Encounter: Payer: Self-pay | Admitting: Family Medicine

## 2014-07-20 VITALS — BP 146/90 | Ht 65.0 in | Wt 270.0 lb

## 2014-07-20 DIAGNOSIS — M0579 Rheumatoid arthritis with rheumatoid factor of multiple sites without organ or systems involvement: Secondary | ICD-10-CM | POA: Insufficient documentation

## 2014-07-20 DIAGNOSIS — R768 Other specified abnormal immunological findings in serum: Secondary | ICD-10-CM

## 2014-07-20 DIAGNOSIS — I10 Essential (primary) hypertension: Secondary | ICD-10-CM

## 2014-07-20 DIAGNOSIS — R7301 Impaired fasting glucose: Secondary | ICD-10-CM | POA: Insufficient documentation

## 2014-07-20 DIAGNOSIS — Z8739 Personal history of other diseases of the musculoskeletal system and connective tissue: Secondary | ICD-10-CM | POA: Insufficient documentation

## 2014-07-20 DIAGNOSIS — E785 Hyperlipidemia, unspecified: Secondary | ICD-10-CM

## 2014-07-20 MED ORDER — ATORVASTATIN CALCIUM 20 MG PO TABS: 20.0000 mg | ORAL_TABLET | Freq: Every day | ORAL | Status: DC

## 2014-07-20 NOTE — Progress Notes (Signed)
   Subjective:    Patient ID: [name], female    DOB: 04-Sep-1950, 63 y.o.   MRN: [id number]  HPI patient arrives office to discuss multiple concerns. History of hyperlipidemia. Patient's compliance off and on. Early on she thought that statins cause her muscle aching but now not so sure.  History of impaired fasting glucose. Patient admits to dietary challenges in this regard. Ongoing sugary intake. Not exercising.  History of apparent rheumatoid arthritis as a child. Also osteoarthritis is a and I'll. Continues to have worsening symptoms. Impressive physical findings on hands with to further workup. To discuss further today.  Patient is here today to go over her BW that she had done on 11/19.  No other concerns.   Day number [phone] answ machine  Review of Systems No headache no chest pain no abdominal pain ongoing joint pain back pain hand pain. See prior notes.    Objective:   Physical Exam  Alert no acute distress. Vitals stable. Lungs clear. Heart regular in rhythm. Hands impressive metacarpophalangeal swelling. Positive Heberden's nodes. Strength intact      Assessment & Plan:  Impression progressive arthritis. X-rays point osteoarthritis sedimentation rate relatively low 22. However deformities and hands look like rheumatoid arthritis and patient has strong rheumatoid factor. Based on this will do referral discussed #2 hyperlipidemia poor control discussed wall initiate therapy and agreement with patient #3 glucose intolerance discussed plan 25 minutes spent most in discussion. Rheumatologist referral. Rationale discussed. WSL

## 2014-08-06 ENCOUNTER — Ambulatory Visit (INDEPENDENT_AMBULATORY_CARE_PROVIDER_SITE_OTHER): Payer: BC Managed Care – PPO | Admitting: Adult Health

## 2014-08-06 ENCOUNTER — Encounter: Payer: Self-pay | Admitting: Adult Health

## 2014-08-06 ENCOUNTER — Other Ambulatory Visit (HOSPITAL_COMMUNITY)
Admission: RE | Admit: 2014-08-06 | Discharge: 2014-08-06 | Disposition: A | Discharge status: Home / Self Care | Payer: BC Managed Care – PPO | Source: Ambulatory Visit | Attending: Adult Health | Admitting: Adult Health

## 2014-08-06 VITALS — BP 140/80 | HR 78 | Ht 64.0 in | Wt 269.0 lb

## 2014-08-06 DIAGNOSIS — Z1212 Encounter for screening for malignant neoplasm of rectum: Secondary | ICD-10-CM

## 2014-08-06 DIAGNOSIS — Z1151 Encounter for screening for human papillomavirus (HPV): Secondary | ICD-10-CM | POA: Insufficient documentation

## 2014-08-06 DIAGNOSIS — Z01419 Encounter for gynecological examination (general) (routine) without abnormal findings: Secondary | ICD-10-CM

## 2014-08-06 DIAGNOSIS — Z139 Encounter for screening, unspecified: Secondary | ICD-10-CM

## 2014-08-06 LAB — HEMOCCULT GUIAC POC 1CARD (OFFICE): Fecal Occult Blood, POC: NEGATIVE

## 2014-08-06 NOTE — Progress Notes (Signed)
Patient ID: [name], female   DOB: [birth year], 63 y.o.   MRN: [id number] History of Present Illness: [name] is a 63 year old white female in for pap and physical.No gyn complaints.   Current Medications, Allergies, Past Medical History, Past Surgical History, Family History and Social History were reviewed in [employer] record.     Review of Systems: Patient denies any headaches, blurred vision, shortness of breath, chest pain, abdominal pain, problems with bowel movements, urination, or intercourse. Has pain in joints, she fell 2 weeks ago on knees and right knee swollen, has had bilateral knee replacements, told to call Dr Faythe Ghee in F/U.No mood swings.Sees Dr Wolfgang Phoenix regularly.    Physical Exam:BP 140/80 mmHg  Pulse 78  Ht 5\' 4"  (1.626 m)  Wt 269 lb (122.018 kg)  BMI 46.15 kg/m2 General:  Well developed, well nourished, no acute distress Skin:  Warm and dry Neck:  Midline trachea, normal thyroid, no carotid bruits heard Lungs; Clear to auscultation bilaterally Breast:  No dominant palpable mass, retraction, or nipple discharge Cardiovascular: Regular rate and rhythm Abdomen:  Soft, non tender, no hepatosplenomegaly Pelvic:  External genitalia is normal in appearance, no lesions.  The vagina has decreased color, moisture and rugae.  The cervix is smooth, pap with HPV performed.  Uterus is felt to be normal size, shape, and contour.  No  adnexal masses or tenderness noted. Rectal: Good sphincter tone, no polyps, internal hemorrhoids felt.  Hemoccult negative.Has low mild rectocele. Extremities:  No varicosities noted, swelling in right knee,fading bruise Psych:  No mood changes,alert and cooperative,seems happy Needs screening colonoscopy.  Impression: Well woman exam with pap    Plan: Referred to Dr Oneida Alar for colonoscopy, review handout on hemorrhoid banding Mammogram now and yearly Pap and physical in 3 years Labs with Dr Wolfgang Phoenix

## 2014-08-06 NOTE — Patient Instructions (Signed)
Mammogram now and yearly Pap and physical in 3 year Referred to Dr Oneida Alar for colonoscopy

## 2014-08-10 LAB — CYTOLOGY - PAP

## 2014-08-24 ENCOUNTER — Telehealth: Payer: Self-pay

## 2014-08-24 NOTE — Telephone Encounter (Signed)
Pt called to schedule screening colonoscopy. Said she is not having any GI problems, no family hx of colon cancer. She had her last one 13 years ago in [place] and she had polyps but was not sure what kind. She thinks that she was told to come back in 3 years. I offered her an OV first and to sign for the records but she insist on called [place] and having the records faxed here and then we can review and go from there.

## 2014-08-24 NOTE — Telephone Encounter (Signed)
Pt called for DS. Pt received letter to be triaged. Please call her back at [phone].  Pt said that she would call back in an hour.

## 2014-08-25 NOTE — Telephone Encounter (Signed)
I would be glad to look at the records if available however given their more than 64 years old that might not be the case. If she is sure that she had polyps I would agree with offering her an office visit as per office protocol.

## 2014-08-28 NOTE — Telephone Encounter (Signed)
Records received on previous colonoscopy. [name], Please review and advise!

## 2014-09-01 NOTE — Telephone Encounter (Signed)
Reviewed records. Patient is a colonoscopy in 2007. Reportedly has a family history of colon cancer. She had a sessile sigmoid colon polyp removed which was a serrated adenoma.  Per office policy, she should have an office visit prior colonoscopy.

## 2014-09-02 NOTE — Telephone Encounter (Signed)
LMOM to call.

## 2014-09-04 NOTE — Telephone Encounter (Signed)
Pt is scheduled for OV on 10/01/2014 at 2:00 pm with Walden Field, NP.

## 2014-10-01 ENCOUNTER — Encounter: Payer: Self-pay | Admitting: Nurse Practitioner

## 2014-10-01 ENCOUNTER — Other Ambulatory Visit: Payer: Self-pay

## 2014-10-01 ENCOUNTER — Ambulatory Visit (INDEPENDENT_AMBULATORY_CARE_PROVIDER_SITE_OTHER): Payer: BLUE CROSS/BLUE SHIELD | Admitting: Nurse Practitioner

## 2014-10-01 VITALS — BP 153/82 | HR 82 | Temp 97.5°F | Ht 65.0 in | Wt 273.2 lb

## 2014-10-01 DIAGNOSIS — Z860101 Personal history of adenomatous and serrated colon polyps: Secondary | ICD-10-CM | POA: Insufficient documentation

## 2014-10-01 DIAGNOSIS — Z86010 Personal history of colonic polyps: Principal | ICD-10-CM

## 2014-10-01 DIAGNOSIS — Z1211 Encounter for screening for malignant neoplasm of colon: Secondary | ICD-10-CM

## 2014-10-01 MED ORDER — PEG-KCL-NACL-NASULF-NA ASC-C 100 G PO SOLR: 1.0000 | Freq: Once | ORAL | Status: DC

## 2014-10-01 NOTE — Progress Notes (Signed)
Primary Care Physician:  Rubbie Battiest, MD Primary Gastroenterologist:  Dr. Oneida Alar  Chief Complaint  Patient presents with  . Colonoscopy    HPI:   64 year old female presents on referral for surveillance colonoscopy. Last colonoscopy done in 2007 in [place], with one polyp removed. Pathology shower serrated adenoma, recommended follow-up in 3 years (2010) which was not done.  Today states she's doing well. Denies consistent abdominal pain, N/V, hematochezia and melena. Has a bowel movement about 1-3 times a day. Her stool consistency is typically 3-4, occasional worsening constipation but this is rare. Denies fever, chills, unintentional weight loss, decreased appetite, overt fatigue or weakness. Admits occasional dysphagia, which is described as maybe once a month. Typically happens with solid foods, with rice given as an example. Denies pill dysphagia. Does not regurgitate, usually passes with throat massage or water. States this is rare and not really bothersome, declines idea of EGD for further evaluation. Admits hemorrhoids which are rarely irritating and is not wanting any medication for them at this time. Does not take NSAIDs or ASA powders. Denies any other upper or lower GI problems.  Past Medical History  Diagnosis Date  . Asthma   . Hypertension   . Hyperlipidemia   . Allergic rhinitis   . Arthritis   . Obesity     Past Surgical History  Procedure Laterality Date  . Back surgery    . Tonsillectomy and adenoidectomy    . Cholecystectomy    . Knee surgery      bilateral knee replacement    Current Outpatient Prescriptions  Medication Sig Dispense Refill  . acetaminophen (TYLENOL) 500 MG tablet Take 500 mg by mouth as needed.    [name] albuterol (PROVENTIL HFA;VENTOLIN HFA) 108 (90 BASE) MCG/ACT inhaler Inhale 2 puffs into the lungs every 6 (six) hours as needed for wheezing. 1 Inhaler 5  . atorvastatin (LIPITOR) 40 MG tablet Take 40 mg by mouth daily.    . B Complex  Vitamins (B COMPLEX PO) Take by mouth daily.    . Omega-3 Fatty Acids (FISH OIL) 1200 MG CAPS Take by mouth daily.    . predniSONE (DELTASONE) 10 MG tablet Take 10 mg by mouth daily with breakfast.    . benzonatate (TESSALON) 100 MG capsule Take 1 capsule (100 mg total) by mouth 3 (three) times daily as needed for cough. (Patient not taking: Reported on 07/20/2014) 24 capsule 0  . Naproxen Sodium (ALEVE PO) Take by mouth 2 (two) times daily.    [name] triamcinolone cream (KENALOG) 0.1 % Apply 1 application topically 2 (two) times daily. (Patient not taking: Reported on 10/01/2014) 30 g 0   No current facility-administered medications for this visit.    Allergies as of 10/01/2014 - Review Complete 10/01/2014  Allergen Reaction Noted  . Codeine  02/21/2007    Family History  Problem Relation Age of Onset  . Heart attack Father   . Leukemia Mother   . Obesity Sister   . Other Sister     breathing problems  . Hepatitis C Sister   . Arthritis Sister   . Emphysema Brother   . Obesity Daughter   . Polycystic ovary syndrome Daughter   . Obesity Son   . Other Son     knee problems  . Cancer Maternal Grandmother     breast  . Other Brother     aneursym  . Arthritis Sister   . Obesity Son   . Asthma Son   .  Obesity Son   . Diabetes Other     runs on dad's side of the family    History   Social History  . Marital Status: Married    Spouse Name: N/A  . Number of Children: N/A  . Years of Education: N/A   Occupational History  . Not on file.   Social History Main Topics  . Smoking status: Never Smoker   . Smokeless tobacco: Never Used  . Alcohol Use: Yes     Comment: sometimes  . Drug Use: No  . Sexual Activity: Yes    Birth Control/ Protection: Post-menopausal   Other Topics Concern  . Not on file   Social History Narrative    Review of Systems: Gen: Denies any fever, chills, fatigue, weight loss, lack of appetite.  CV: Denies chest pain, heart palpitations,  peripheral edema, syncope.  Resp: Denies shortness of breath at rest or with exertion. Denies wheezing or cough.  GI: See HPI. Denies dysphagia or odynophagia. Denies jaundice, hematemesis, fecal incontinence. GU : Denies urinary burning, urinary frequency, urinary hesitancy MS: Denies joint pain, muscle weakness, cramps, or limitation of movement.  Derm: Denies rash, itching, dry skin Psych: Denies depression, anxiety, memory loss, and confusion Heme: Denies bruising, bleeding, and enlarged lymph nodes.  Physical Exam: BP 153/82 mmHg  Pulse 82  Temp(Src) 97.5 F (36.4 C) (Oral)  Ht 5\' 5"  (1.651 m)  Wt 273 lb 3.2 oz (123.923 kg)  BMI 45.46 kg/m2 General:   Alert and oriented. Pleasant and cooperative. Well-nourished and well-developed.  Head:  Normocephalic and atraumatic. Eyes:  Without icterus, sclera clear and conjunctiva pink.  Ears:  Normal auditory acuity. Nose:  No deformity, discharge,  or lesions. Mouth:  No deformity or lesions, oral mucosa pink.  Neck:  Supple, without mass or thyromegaly. Lungs:  Clear to auscultation bilaterally. No wheezes, rales, or rhonchi. No distress.  Heart:  S1, S2 present without murmurs appreciated.  Abdomen:  +BS, soft, non-tender and non-distended. No HSM noted. No guarding or rebound. No masses appreciated.  Rectal:  Deferred  Msk:  Symmetrical without gross deformities. Normal posture. Pulses:  Normal pulses noted. Extremities:  Without clubbing or edema. Neurologic:  Alert and  oriented x4;  grossly normal neurologically. Skin:  Intact without significant lesions or rashes. Cervical Nodes:  No significant cervical adenopathy. Psych:  Alert and cooperative. Normal mood and affect.     10/01/2014 3:01 PM

## 2014-10-01 NOTE — Patient Instructions (Signed)
1. We will schedule your colonoscopy for you 2. Further recommendations to be based on the results of your colonoscopy. 3. Call us if you have any questions or problems

## 2014-10-01 NOTE — Assessment & Plan Note (Signed)
No known family history of colon ca though limited information about family; Family history of multiple cancers and personal history of colon polyps. Last colonoscopy in [place] 08/08/2006 with good prep and single sigmoid polyp; path: serrated adenoma; recommended surveillance in 3 years but patient missed the appointment. No overt GI bleed, abdominal pain, or other red flag/warning signs. Will proceed with colonoscopy.  Proceed with colonoscopy with Dr. Oneida Alar in the near future. The risks, benefits, and alternatives have been discussed in detail with the patient. They state understanding and desire to proceed.

## 2014-10-02 NOTE — Progress Notes (Signed)
cc'ed to pcp °

## 2014-10-12 ENCOUNTER — Encounter (HOSPITAL_COMMUNITY): Payer: Self-pay

## 2014-10-12 ENCOUNTER — Encounter (HOSPITAL_COMMUNITY)
Admission: RE | Disposition: A | Discharge status: Home / Self Care | Payer: Self-pay | Source: Ambulatory Visit | Attending: Gastroenterology

## 2014-10-12 ENCOUNTER — Ambulatory Visit (HOSPITAL_COMMUNITY)
Admission: RE | Admit: 2014-10-12 | Discharge: 2014-10-12 | Disposition: A | Discharge status: Home / Self Care | Payer: BLUE CROSS/BLUE SHIELD | Source: Ambulatory Visit | Attending: Gastroenterology | Admitting: Gastroenterology

## 2014-10-12 DIAGNOSIS — Z886 Allergy status to analgesic agent status: Secondary | ICD-10-CM | POA: Insufficient documentation

## 2014-10-12 DIAGNOSIS — K644 Residual hemorrhoidal skin tags: Secondary | ICD-10-CM | POA: Insufficient documentation

## 2014-10-12 DIAGNOSIS — Z96653 Presence of artificial knee joint, bilateral: Secondary | ICD-10-CM | POA: Diagnosis not present

## 2014-10-12 DIAGNOSIS — K621 Rectal polyp: Secondary | ICD-10-CM | POA: Insufficient documentation

## 2014-10-12 DIAGNOSIS — E669 Obesity, unspecified: Secondary | ICD-10-CM | POA: Diagnosis not present

## 2014-10-12 DIAGNOSIS — K648 Other hemorrhoids: Secondary | ICD-10-CM | POA: Insufficient documentation

## 2014-10-12 DIAGNOSIS — Z6841 Body Mass Index (BMI) 40.0 and over, adult: Secondary | ICD-10-CM | POA: Diagnosis not present

## 2014-10-12 DIAGNOSIS — M1990 Unspecified osteoarthritis, unspecified site: Secondary | ICD-10-CM | POA: Diagnosis not present

## 2014-10-12 DIAGNOSIS — E785 Hyperlipidemia, unspecified: Secondary | ICD-10-CM | POA: Insufficient documentation

## 2014-10-12 DIAGNOSIS — J45909 Unspecified asthma, uncomplicated: Secondary | ICD-10-CM | POA: Insufficient documentation

## 2014-10-12 DIAGNOSIS — Z9049 Acquired absence of other specified parts of digestive tract: Secondary | ICD-10-CM | POA: Insufficient documentation

## 2014-10-12 DIAGNOSIS — I10 Essential (primary) hypertension: Secondary | ICD-10-CM | POA: Insufficient documentation

## 2014-10-12 DIAGNOSIS — D123 Benign neoplasm of transverse colon: Secondary | ICD-10-CM | POA: Insufficient documentation

## 2014-10-12 DIAGNOSIS — Z1211 Encounter for screening for malignant neoplasm of colon: Secondary | ICD-10-CM | POA: Insufficient documentation

## 2014-10-12 DIAGNOSIS — Z86010 Personal history of colonic polyps: Secondary | ICD-10-CM

## 2014-10-12 HISTORY — PX: COLONOSCOPY: SHX5424

## 2014-10-12 SURGERY — COLONOSCOPY
Anesthesia: Moderate Sedation

## 2014-10-12 MED ORDER — MIDAZOLAM HCL 5 MG/5ML IJ SOLN
INTRAMUSCULAR | Status: AC
  Filled 2014-10-12: qty 10

## 2014-10-12 MED ORDER — MEPERIDINE HCL 100 MG/ML IJ SOLN
INTRAMUSCULAR | Status: DC | PRN
  Administered 2014-10-12 (×4): 25 mg via INTRAVENOUS

## 2014-10-12 MED ORDER — MIDAZOLAM HCL 5 MG/5ML IJ SOLN
INTRAMUSCULAR | Status: DC | PRN
  Administered 2014-10-12 (×4): 2 mg via INTRAVENOUS

## 2014-10-12 MED ORDER — MEPERIDINE HCL 100 MG/ML IJ SOLN
INTRAMUSCULAR | Status: AC
  Filled 2014-10-12: qty 2

## 2014-10-12 NOTE — Discharge Instructions (Signed)
You had 4 polyps removed. You have internal and external hemorrhoids.   FOLLOW A HIGH FIBER DIET. AVOID ITEMS THAT CAUSE BLOATING & GAS. SEE INFO BELOW.  YOUR BIOPSY RESULTS WILL BE AVAILABLE IN MY CHART AFTER FEB 25  OR MY OFFICE WILL CONTACT YOU IN 10-14 DAYS WITH YOUR RESULTS.   Next colonoscopy in 3-5 years. YOUR SISTERS, BROTHERS, CHILDREN, AND PARENTS NEED TO HAVE A COLONOSCOPY STARTING AT THE AGE OF 40 YEARS.   Colonoscopy Care After Read the instructions outlined below and refer to this sheet in the next week. These discharge instructions provide you with general information on caring for yourself after you leave the hospital. While your treatment has been planned according to the most current medical practices available, unavoidable complications occasionally occur. If you have any problems or questions after discharge, call DR. Tyrus Wilms, [phone].  ACTIVITY  You may resume your regular activity, but move at a slower pace for the next 24 hours.   Take frequent rest periods for the next 24 hours.   Walking will help get rid of the air and reduce the bloated feeling in your belly (abdomen).   No driving for 24 hours (because of the medicine (anesthesia) used during the test).   You may shower.   Do not sign any important legal documents or operate any machinery for 24 hours (because of the anesthesia used during the test).    NUTRITION  Drink plenty of fluids.   You may resume your normal diet as instructed by your doctor.   Begin with a light meal and progress to your normal diet. Heavy or fried foods are harder to digest and may make you feel sick to your stomach (nauseated).   Avoid alcoholic beverages for 24 hours or as instructed.    MEDICATIONS  You may resume your normal medications.   WHAT YOU CAN EXPECT TODAY  Some feelings of bloating in the abdomen.   Passage of more gas than usual.   Spotting of blood in your stool or on the toilet paper  .    IF YOU HAD POLYPS REMOVED DURING THE COLONOSCOPY:  Eat a soft diet IF YOU HAVE NAUSEA, BLOATING, ABDOMINAL PAIN, OR VOMITING.    FINDING OUT THE RESULTS OF YOUR TEST Not all test results are available during your visit. DR. Oneida Alar WILL CALL YOU WITHIN 14 DAYS OF YOUR PROCEDUE WITH YOUR RESULTS. Do not assume everything is normal if you have not heard from DR. Markice Torbert, CALL HER OFFICE AT [phone].  SEEK IMMEDIATE MEDICAL ATTENTION AND CALL THE OFFICE: [phone] IF:  You have more than a spotting of blood in your stool.   Your belly is swollen (abdominal distention).   You are nauseated or vomiting.   You have a temperature over 101F.   You have abdominal pain or discomfort that is severe or gets worse throughout the day.   High-Fiber Diet A high-fiber diet changes your normal diet to include more whole grains, legumes, fruits, and vegetables. Changes in the diet involve replacing refined carbohydrates with unrefined foods. The calorie level of the diet is essentially unchanged. The Dietary Reference Intake (recommended amount) for adult males is 38 grams per day. For adult females, it is 25 grams per day. Pregnant and lactating women should consume 28 grams of fiber per day. Fiber is the intact part of a plant that is not broken down during digestion. Functional fiber is fiber that has been isolated from the plant to provide a beneficial effect  in the body. PURPOSE  Increase stool bulk.   Ease and regulate bowel movements.   Lower cholesterol.  INDICATIONS THAT YOU NEED MORE FIBER  Constipation and hemorrhoids.   Uncomplicated diverticulosis (intestine condition) and irritable bowel syndrome.   Weight management.   As a protective measure against hardening of the arteries (atherosclerosis), diabetes, and cancer.   GUIDELINES FOR INCREASING FIBER IN THE DIET  Start adding fiber to the diet slowly. A gradual increase of about 5 more grams (2 slices of whole-wheat  bread, 2 servings of most fruits or vegetables, or 1 bowl of high-fiber cereal) per day is best. Too rapid an increase in fiber may result in constipation, flatulence, and bloating.   Drink enough water and fluids to keep your urine clear or pale yellow. Water, juice, or caffeine-free drinks are recommended. Not drinking enough fluid may cause constipation.   Eat a variety of high-fiber foods rather than one type of fiber.   Try to increase your intake of fiber through using high-fiber foods rather than fiber pills or supplements that contain small amounts of fiber.   The goal is to change the types of food eaten. Do not supplement your present diet with high-fiber foods, but replace foods in your present diet.  INCLUDE A VARIETY OF FIBER SOURCES  Replace refined and processed grains with whole grains, canned fruits with fresh fruits, and incorporate other fiber sources. White rice, white breads, and most bakery goods contain little or no fiber.   Brown whole-grain rice, buckwheat oats, and many fruits and vegetables are all good sources of fiber. These include: broccoli, Brussels sprouts, cabbage, cauliflower, beets, sweet potatoes, white potatoes (skin on), carrots, tomatoes, eggplant, squash, berries, fresh fruits, and dried fruits.   Cereals appear to be the richest source of fiber. Cereal fiber is found in whole grains and bran. Bran is the fiber-rich outer coat of cereal grain, which is largely removed in refining. In whole-grain cereals, the bran remains. In breakfast cereals, the largest amount of fiber is found in those with "bran" in their names. The fiber content is sometimes indicated on the label.   You may need to include additional fruits and vegetables each day.   In baking, for 1 cup white flour, you may use the following substitutions:   1 cup whole-wheat flour minus 2 tablespoons.   1/2 cup white flour plus 1/2 cup whole-wheat flour.   Polyps, Colon  A polyp is extra  tissue that grows inside your body. Colon polyps grow in the large intestine. The large intestine, also called the colon, is part of your digestive system. It is a long, hollow tube at the end of your digestive tract where your body makes and stores stool. Most polyps are not dangerous. They are benign. This means they are not cancerous. But over time, some types of polyps can turn into cancer. Polyps that are smaller than a pea are usually not harmful. But larger polyps could someday become or may already be cancerous. To be safe, doctors remove all polyps and test them.   WHO GETS POLYPS? Anyone can get polyps, but certain people are more likely than others. You may have a greater chance of getting polyps if:  You are over 50.   You have had polyps before.   Someone in your family has had polyps.   Someone in your family has had cancer of the large intestine.   Find out if someone in your family has had polyps. You may  also be more likely to get polyps if you:   Eat a lot of fatty foods   Smoke   Drink alcohol   Do not exercise  Eat too much   TREATMENT  The caregiver will remove the polyp during sigmoidoscopy or colonoscopy.    PREVENTION There is not one sure way to prevent polyps. You might be able to lower your risk of getting them if you:  Eat more fruits and vegetables and less fatty food.   Do not smoke.   Avoid alcohol.   Exercise every day.   Lose weight if you are overweight.   Eating more calcium and folate can also lower your risk of getting polyps. Some foods that are rich in calcium are milk, cheese, and broccoli. Some foods that are rich in folate are chickpeas, kidney beans, and spinach.   Hemorrhoids Hemorrhoids are dilated (enlarged) veins around the rectum. Sometimes clots will form in the veins. This makes them swollen and painful. These are called thrombosed hemorrhoids. Causes of hemorrhoids include:  Constipation.   Straining to have a bowel  movement.   HEAVY LIFTING HOME CARE INSTRUCTIONS  Eat a well balanced diet and drink 6 to 8 glasses of water every day to avoid constipation. You may also use a bulk laxative.   Avoid straining to have bowel movements.   Keep anal area dry and clean.   Do not use a donut shaped pillow or sit on the toilet for long periods. This increases blood pooling and pain.   Move your bowels when your body has the urge; this will require less straining and will decrease pain and pressure.

## 2014-10-12 NOTE — H&P (Signed)
Primary Care Physician:  Rubbie Battiest, MD Primary Gastroenterologist:  Dr. Oneida Alar  Pre-Procedure History & Physical: HPI:  [name] is a 64 y.o. female here for COLON CANCER SCREENING-PMHx; SERRATED ADENOMA 2007.  Past Medical History  Diagnosis Date  . Asthma   . Hypertension   . Hyperlipidemia   . Allergic rhinitis   . Arthritis   . Obesity     Past Surgical History  Procedure Laterality Date  . Back surgery    . Tonsillectomy and adenoidectomy    . Cholecystectomy    . Knee surgery      bilateral knee replacement  . Colonoscopy  2007    sessile sigmoid polyp x 1; path: serrated adenoma  . Lipoma excision      x 2    Prior to Admission medications   Medication Sig Start Date End Date Taking? Authorizing Provider  atorvastatin (LIPITOR) 40 MG tablet Take 40 mg by mouth daily.   Yes Historical Provider, MD  B Complex Vitamins (B COMPLEX PO) Take 1 tablet by mouth daily.    Yes Historical Provider, MD  Calcium Carbonate-Vitamin D (CALCIUM + D PO) Take 1 tablet by mouth daily.   Yes Historical Provider, MD  Omega-3 Fatty Acids (FISH OIL) 1200 MG CAPS Take by mouth daily.   Yes Historical Provider, MD  peg 3350 powder (MOVIPREP) 100 G SOLR Take 1 kit (200 g total) by mouth once. 10/01/14 10/31/14 Yes Ranae Pila, NP  predniSONE (DELTASONE) 10 MG tablet Take 10 mg by mouth daily with breakfast.   Yes Historical Provider, MD  triamcinolone cream (KENALOG) 0.1 % Apply 1 application topically 2 (two) times daily. Patient taking differently: Apply 1 application topically 2 (two) times daily as needed.  06/22/14  Yes [name], MD  acetaminophen (TYLENOL) 500 MG tablet Take 500 mg by mouth every 8 (eight) hours as needed (pain).     Historical Provider, MD  albuterol (PROVENTIL HFA;VENTOLIN HFA) 108 (90 BASE) MCG/ACT inhaler Inhale 2 puffs into the lungs every 6 (six) hours as needed for wheezing. 06/17/13   [name], MD    Allergies as of 10/01/2014 -  Review Complete 10/01/2014  Allergen Reaction Noted  . Codeine  02/21/2007    Family History  Problem Relation Age of Onset  . Heart attack Father   . Leukemia Mother   . Obesity Sister   . Other Sister     breathing problems  . Hepatitis C Sister   . Arthritis Sister   . Emphysema Brother   . Obesity Daughter   . Polycystic ovary syndrome Daughter   . Obesity Son   . Other Son     knee problems  . Cancer Maternal Grandmother     breast  . Other Brother     aneursym  . Arthritis Sister   . Obesity Son   . Asthma Son   . Obesity Son   . Diabetes Other     runs on dad's side of the family  . Colon cancer Neg Hx     states she doesn't know a lot about her family, but has never heard of specific colon ca    History   Social History  . Marital Status: Married    Spouse Name: N/A  . Number of Children: N/A  . Years of Education: N/A   Occupational History  . Not on file.   Social History Main Topics  . Smoking status: Never Smoker   .  Smokeless tobacco: Never Used  . Alcohol Use: 0.0 oz/week    0 Standard drinks or equivalent per week     Comment: sometimes; rarely  . Drug Use: No  . Sexual Activity: Yes    Birth Control/ Protection: Post-menopausal   Other Topics Concern  . Not on file   Social History Narrative    Review of Systems: See HPI, otherwise negative ROS   Physical Exam: BP 171/83 mmHg  Pulse 71  Temp(Src) 97.8 F (36.6 C) (Oral)  Resp 20  Ht '5\' 5"'  (1.651 m)  Wt 273 lb (123.832 kg)  BMI 45.43 kg/m2  SpO2 94% General:   Alert,  pleasant and cooperative in NAD Head:  Normocephalic and atraumatic. Neck:  Supple; Lungs:  Clear throughout to auscultation.    Heart:  Regular rate and rhythm. Abdomen:  Soft, nontender and nondistended. Normal bowel sounds, without guarding, and without rebound.   Neurologic:  Alert and  oriented x4;  grossly normal neurologically.  Impression/Plan:    SCREENING  Plan:  1. TCS TODAY

## 2014-10-12 NOTE — Progress Notes (Signed)
REVIEWED-NO ADDITIONAL RECOMMENDATIONS. 

## 2014-10-12 NOTE — Op Note (Signed)
[facility] [address] [place], [address]   COLONOSCOPY PROCEDURE REPORT  PATIENT: Catherine, [name]  MR#: [id number] BIRTHDATE: [birth year] , 31  yrs. old GENDER: female ENDOSCOPIST: [name], MD REFERRED SJ:[id number] Wolfgang Phoenix, M.D. PROCEDURE DATE:  November 09, 2014 PROCEDURE:   Colonoscopy with cold biopsy polypectomy INDICATIONS:high risk patient with personal history of colonic polyps. MEDICATIONS: Demerol 100 mg IV and Versed 8 mg IV  DESCRIPTION OF PROCEDURE:    Physical exam was performed.  Informed consent was obtained from the patient after explaining the benefits, risks, and alternatives to procedure.  The patient was connected to monitor and placed in left lateral position. Continuous oxygen was provided by nasal cannula and IV medicine administered through an indwelling cannula.  After administration of sedation and rectal exam, the patients rectum was intubated and the EC-[id number] ([id number])  colonoscope was advanced under direct visualization to the ileum.  The scope was removed slowly by carefully examining the color, texture, anatomy, and integrity mucosa on the way out.  The patient was recovered in endoscopy and discharged home in satisfactory condition.    COLON FINDINGS: A sessile polyp measuring 5 mm in size was found in the descending colon.  A polypectomy was performed with cold forceps and Sessile polyp(s) ranging from 2 to 82mm in size were found.  A polypectomy was performed with cold forceps.     SMALL INTERNAL AND MODERATE EXTERNAL HEMORRHOIDS.  PREP QUALITY: good. CECAL W/D TIME: 15       minutes  COMPLICATIONS: None  ENDOSCOPIC IMPRESSION: 1.   FOUR COLON POLYPS REMOVED 2.   INTERNAL AND EXTERNAL HEMORRHOIDS  RECOMMENDATIONS: FOLLOW A HIGH FIBER DIET.  AVOID ITEMS THAT CAUSE BLOATING & GAS. AWAIT BIOPSY Next colonoscopy in 3-5 years.  YOUR SISTERS, BROTHERS, CHILDREN, AND PARENTS NEED TO HAVE A COLONOSCOPY STARTING AT THE AGE OF  40 YEARS.    _______________________________ Lorrin MaisDanie Binder, MD November 09, 2014 3:45 PM   CPT CODES: ICD CODES:  The ICD and CPT codes recommended by this software are interpretations from the data that the clinical staff has captured with the software.  The verification of the translation of this report to the ICD and CPT codes and modifiers is the sole responsibility of the health care institution and practicing physician where this report was generated.  [employer] will not be held responsible for the validity of the ICD and CPT codes included on this report.  AMA assumes no liability for data contained or not contained herein. CPT is a Designer, television/film set of the [employer].

## 2014-10-13 ENCOUNTER — Encounter (HOSPITAL_COMMUNITY): Payer: Self-pay | Admitting: Gastroenterology

## 2014-10-14 ENCOUNTER — Telehealth: Payer: Self-pay | Admitting: Gastroenterology

## 2014-10-14 NOTE — Telephone Encounter (Signed)
Please call pt. She had ONE simple adenomas removed. THE REST WERE HYPERPLASTIC.  FOLLOW A HIGH FIBER DIET. AVOID ITEMS THAT CAUSE BLOATING & GAS.   Next colonoscopy in 5 years. YOUR SISTERS, BROTHERS, CHILDREN, AND PARENTS NEED TO HAVE A COLONOSCOPY STARTING AT THE AGE OF 40 YEARS.

## 2014-10-15 NOTE — Telephone Encounter (Signed)
Pt called and was informed of her results.  

## 2014-10-15 NOTE — Telephone Encounter (Signed)
LMOM to call.

## 2014-10-19 NOTE — Telephone Encounter (Signed)
Reminder in epic °

## 2014-12-04 ENCOUNTER — Encounter: Payer: Self-pay | Admitting: Family Medicine

## 2014-12-04 NOTE — Telephone Encounter (Signed)
Please call our office and schedule your appointment with our front staff and have them take a message for the doctor about blood work.

## 2014-12-29 ENCOUNTER — Other Ambulatory Visit: Payer: Self-pay | Admitting: *Deleted

## 2014-12-29 DIAGNOSIS — E785 Hyperlipidemia, unspecified: Secondary | ICD-10-CM

## 2015-01-05 LAB — LIPID PANEL
CHOL/HDL RATIO: 3.6 ratio (ref 0.0–4.4)
Cholesterol, Total: 211 mg/dL — ABNORMAL HIGH (ref 100–199)
HDL: 58 mg/dL (ref >39)
LDL CALC: 142 mg/dL — AB (ref 0–99)
Triglycerides: 54 mg/dL (ref 0–149)
VLDL Cholesterol Cal: 11 mg/dL (ref 5–40)

## 2015-01-11 ENCOUNTER — Ambulatory Visit (INDEPENDENT_AMBULATORY_CARE_PROVIDER_SITE_OTHER): Payer: BLUE CROSS/BLUE SHIELD | Admitting: Family Medicine

## 2015-01-11 ENCOUNTER — Encounter: Payer: Self-pay | Admitting: Family Medicine

## 2015-01-11 VITALS — BP 142/94 | Ht 65.0 in | Wt 270.0 lb

## 2015-01-11 DIAGNOSIS — H8110 Benign paroxysmal vertigo, unspecified ear: Secondary | ICD-10-CM | POA: Diagnosis not present

## 2015-01-11 DIAGNOSIS — I10 Essential (primary) hypertension: Secondary | ICD-10-CM | POA: Diagnosis not present

## 2015-01-11 DIAGNOSIS — E785 Hyperlipidemia, unspecified: Secondary | ICD-10-CM | POA: Diagnosis not present

## 2015-01-11 DIAGNOSIS — R7301 Impaired fasting glucose: Secondary | ICD-10-CM

## 2015-01-11 MED ORDER — ATORVASTATIN CALCIUM 40 MG PO TABS: 40.0000 mg | ORAL_TABLET | Freq: Every day | ORAL | Status: DC

## 2015-01-11 MED ORDER — LISINOPRIL 10 MG PO TABS: 10.0000 mg | ORAL_TABLET | Freq: Every day | ORAL | Status: DC

## 2015-01-11 NOTE — Progress Notes (Signed)
   Subjective:    Patient ID: [name], female    DOB: Sep 06, 1950, 64 y.o.   MRN: [id number] Patient arrives office with numerous concerns Hyperlipidemia This is a chronic problem. The current episode started more than 1 year ago. Treatments tried: lipitor 20mg , pt walks for exercise.    pt had bw done on 5/16. Pt had lipitor 40mg  on med list but bottle was 20mg .   BP numberds are al over the place. Strong family history of bp meds  Pt has fell twice in the past couple months. One time she reports was due to clumsiness. Another time unsteadiness secondary to vertigo.   Pt states BP has been running high and wants to discuss starting back on BP med.  pt states no concerns today.   Intermittent spells o fvertigo, usually just moments of it, had a particularly bad spell after a manipulation via her chiropractor.  He uses hearing aids  Review of Systems No chest pain no back pain no abdominal pain ongoing joint pain from rheumatoid arthritis no loss of consciousness    Objective:   Physical Exam  Alert vitals stable blood pressure 140/88 on repeat HEENT external canal tympanic membranes normal lungs clear heart regular rate and rhythm. Next  Blood work reviewed.this Results for orders placed or performed in visit on 12/29/14  Lipid panel  Result Value Ref Range   Cholesterol, Total 211 (H) 100 - 199 mg/dL   Triglycerides 54 0 - 149 mg/dL   HDL 58 >39 mg/dL   VLDL Cholesterol Cal 11 5 - 40 mg/dL   LDL Calculated 142 (H) 0 - 99 mg/dL   Chol/HDL Ratio 3.6 0.0 - 4.4 ratio units         Assessment & Plan:  Impression 1 hypertension suboptimal in control discussed many numbers at home are high including a systolic up to 035 #2 hyperlipidemia suboptimal discussed #3 vertigo discussed at length patient wishes to hold off on further referral from plan diet exercise discussed. Increase Lipitor to 40 mg daily. Reinstitute

## 2015-03-25 ENCOUNTER — Telehealth: Payer: Self-pay | Admitting: Family Medicine

## 2015-03-25 MED ORDER — CHLORZOXAZONE 500 MG PO TABS: 500.0000 mg | ORAL_TABLET | Freq: Three times a day (TID) | ORAL | Status: DC | PRN

## 2015-03-25 NOTE — Telephone Encounter (Signed)
Patient called left message for a muscle relaxer for back pain. She will the traveling to [facility] for 12 hours and her chiroprator suggested she get something from her primary care doctor. Call into [facility].

## 2015-03-25 NOTE — Telephone Encounter (Signed)
Rx sent electronically to pharmacy. Patient notified. 

## 2015-03-25 NOTE — Telephone Encounter (Signed)
Chlorzoxazone 500 numb 30 one tid prn spasm, one ref

## 2015-06-02 ENCOUNTER — Ambulatory Visit (INDEPENDENT_AMBULATORY_CARE_PROVIDER_SITE_OTHER): Payer: BLUE CROSS/BLUE SHIELD | Admitting: Family Medicine

## 2015-06-02 ENCOUNTER — Encounter: Payer: Self-pay | Admitting: Family Medicine

## 2015-06-02 ENCOUNTER — Other Ambulatory Visit: Payer: Self-pay | Admitting: *Deleted

## 2015-06-02 ENCOUNTER — Telehealth: Payer: Self-pay | Admitting: Family Medicine

## 2015-06-02 VITALS — BP 120/80 | Temp 98.2°F | Ht 65.0 in | Wt 226.5 lb

## 2015-06-02 DIAGNOSIS — E785 Hyperlipidemia, unspecified: Secondary | ICD-10-CM | POA: Diagnosis not present

## 2015-06-02 DIAGNOSIS — Z79899 Other long term (current) drug therapy: Secondary | ICD-10-CM

## 2015-06-02 DIAGNOSIS — M25571 Pain in right ankle and joints of right foot: Secondary | ICD-10-CM | POA: Diagnosis not present

## 2015-06-02 MED ORDER — ALBUTEROL SULFATE HFA 108 (90 BASE) MCG/ACT IN AERS: 2.0000 | INHALATION_SPRAY | Freq: Four times a day (QID) | RESPIRATORY_TRACT | Status: DC | PRN

## 2015-06-02 NOTE — Telephone Encounter (Signed)
Pt is needing a refill on her qvar.   [facility] [place]

## 2015-06-02 NOTE — Progress Notes (Signed)
   Subjective:    Patient ID: [name], female    DOB: Nov 09, 1950, 64 y.o.   MRN: [id number]  Foot Injury  The incident occurred more than 1 week ago. There was no injury mechanism. The pain is present in the right foot. The quality of the pain is described as aching. The pain is at a severity of 8/10. The pain is moderate. The pain has been constant since onset. She reports no foreign bodies present. The symptoms are aggravated by movement. She has tried NSAIDs (Biofreeze) for the symptoms. The treatment provided moderate relief.   Feels better with standing and now hurts a lot  Patient states that she has no other concerns at this time.   Few wks started three or four wks ago,,  Hurts bad and a lot,  Recalls no injury Review of Systems    no ankle pain no fever no chills no rash Objective:   Physical Exam  Alert vitals stable lungs clear heart rhythm right lateral foot swelling syndrome feel at fifth metatarsal tarsal joint no point tenderness diffuse mild tenderness      Assessment & Plan:  Impression probable midfoot arthritis with rheumatoid arthritis involvement discuss recalls no injury. Plan symptom care discussed. If persists would recommend x-ray but not for now follow-up with rheumatoid arthritis specialist [facility]

## 2015-06-02 NOTE — Telephone Encounter (Signed)
Pt coming in today for appt.  Pt has not had refill in 2 years and is having some wheezing. Using old qvar inhaler.

## 2015-06-02 NOTE — Patient Instructions (Signed)
If foot pain persists or worsens we or your rheumatologist or the foot doctor can doan xray, but for now high likelihood is primary arthritis of joint in the foot

## 2015-07-06 LAB — LIPID PANEL
CHOL/HDL RATIO: 3.3 ratio (ref 0.0–4.4)
Cholesterol, Total: 152 mg/dL (ref 100–199)
HDL: 46 mg/dL (ref >39)
LDL Calculated: 96 mg/dL (ref 0–99)
Triglycerides: 48 mg/dL (ref 0–149)
VLDL CHOLESTEROL CAL: 10 mg/dL (ref 5–40)

## 2015-07-06 LAB — HEPATIC FUNCTION PANEL
ALBUMIN: 3.9 g/dL (ref 3.6–4.8)
ALK PHOS: 71 IU/L (ref 39–117)
ALT: 20 IU/L (ref 0–32)
AST: 16 IU/L (ref 0–40)
BILIRUBIN, DIRECT: 0.13 mg/dL (ref 0.00–0.40)
Bilirubin Total: 0.5 mg/dL (ref 0.0–1.2)
TOTAL PROTEIN: 6 g/dL (ref 6.0–8.5)

## 2015-07-06 LAB — BASIC METABOLIC PANEL
BUN/Creatinine Ratio: 11 (ref 11–26)
BUN: 8 mg/dL (ref 8–27)
CO2: 25 mmol/L (ref 18–29)
Calcium: 10.2 mg/dL (ref 8.7–10.3)
Chloride: 104 mmol/L (ref 97–106)
Creatinine, Ser: 0.7 mg/dL (ref 0.57–1.00)
GFR calc Af Amer: 106 mL/min/{1.73_m2} (ref >59)
GFR, EST NON AFRICAN AMERICAN: 92 mL/min/{1.73_m2} (ref >59)
GLUCOSE: 64 mg/dL — AB (ref 65–99)
Potassium: 4.8 mmol/L (ref 3.5–5.2)
SODIUM: 142 mmol/L (ref 136–144)

## 2015-07-12 ENCOUNTER — Encounter: Payer: Self-pay | Admitting: Family Medicine

## 2015-07-12 ENCOUNTER — Ambulatory Visit (INDEPENDENT_AMBULATORY_CARE_PROVIDER_SITE_OTHER): Payer: BLUE CROSS/BLUE SHIELD | Admitting: Family Medicine

## 2015-07-12 VITALS — BP 112/72 | Ht 65.0 in | Wt 221.0 lb

## 2015-07-12 DIAGNOSIS — Z23 Encounter for immunization: Secondary | ICD-10-CM

## 2015-07-12 DIAGNOSIS — I10 Essential (primary) hypertension: Secondary | ICD-10-CM

## 2015-07-12 DIAGNOSIS — E785 Hyperlipidemia, unspecified: Secondary | ICD-10-CM | POA: Diagnosis not present

## 2015-07-12 DIAGNOSIS — R7301 Impaired fasting glucose: Secondary | ICD-10-CM | POA: Diagnosis not present

## 2015-07-12 MED ORDER — ATORVASTATIN CALCIUM 40 MG PO TABS: 40.0000 mg | ORAL_TABLET | Freq: Every day | ORAL | Status: DC

## 2015-07-12 MED ORDER — LISINOPRIL 10 MG PO TABS: 10.0000 mg | ORAL_TABLET | Freq: Every day | ORAL | Status: DC

## 2015-07-12 NOTE — Progress Notes (Signed)
   Subjective:    Patient ID: [name], female    DOB: 1950-10-03, 64 y.o.   MRN: [id number] Patient presents with multiple concerns Hypertension The current episode started more than 1 year ago. There are no compliance problems (exercises some walks and swims, eats healthy).   compliant with blood pressure medication. No obvious side effects meds reviewed.   Concerns about hair falling out. Pt read that this was a side effect of plaquenil. Hair coming out a t higher rate than usual, no fam hx of hair loss in older women Sees RA doctor on dec 2nd.  BPs generally good 120 ish , meds faithfully, no obv s e's  occas h a's and dizziness  Chol med compliant, watching diet well, really good, on hi protein low carb diet,meds reviewed today.  Exercise sporadic because of foot, even water aerobics is spotty   Flu vaccine today.   Results for orders placed or performed in visit on 06/02/15  Lipid panel  Result Value Ref Range   Cholesterol, Total 152 100 - 199 mg/dL   Triglycerides 48 0 - 149 mg/dL   HDL 46 >39 mg/dL   VLDL Cholesterol Cal 10 5 - 40 mg/dL   LDL Calculated 96 0 - 99 mg/dL   Chol/HDL Ratio 3.3 0.0 - 4.4 ratio units  Hepatic function panel  Result Value Ref Range   Total Protein 6.0 6.0 - 8.5 g/dL   Albumin 3.9 3.6 - 4.8 g/dL   Bilirubin Total 0.5 0.0 - 1.2 mg/dL   Bilirubin, Direct 0.13 0.00 - 0.40 mg/dL   Alkaline Phosphatase 71 39 - 117 IU/L   AST 16 0 - 40 IU/L   ALT 20 0 - 32 IU/L  Basic metabolic panel  Result Value Ref Range   Glucose 64 (L) 65 - 99 mg/dL   BUN 8 8 - 27 mg/dL   Creatinine, Ser 0.70 0.57 - 1.00 mg/dL   GFR calc non Af Amer 92 >59 mL/min/1.73   GFR calc Af Amer 106 >59 mL/min/1.73   BUN/Creatinine Ratio 11 11 - 26   Sodium 142 136 - 144 mmol/L   Potassium 4.8 3.5 - 5.2 mmol/L   Chloride 104 97 - 106 mmol/L   CO2 25 18 - 29 mmol/L   Calcium 10.2 8.7 - 10.3 mg/dL   Hands are better but foot still hurts.    Review of Systems No  vomiting no diarrhea no abdominal pain no change in bowel habits    Objective:   Physical Exam  Alert vitals stable HEENT scalp slight diminished presence of here no focal alopecia blood pressure good on repeat lungs clear heart regular in rhythm.      Assessment & Plan:  Impression 1 hypertension good control meds reviewed patient maintain same #2 hyperlipidemia good control meds reviewed patient to remain same therapy diet exercise discussed #3 alopecia potentially related the plaque [place] importance of this medicine to help control symptoms discussed at length. 25 minutes spent most in discussion. Maintain all medications. Diet exercise discussed recheck in 6 months [facility]

## 2015-07-12 NOTE — Patient Instructions (Signed)
Results for orders placed or performed in visit on 06/02/15  Lipid panel  Result Value Ref Range   Cholesterol, Total 152 100 - 199 mg/dL   Triglycerides 48 0 - 149 mg/dL   HDL 46 >39 mg/dL   VLDL Cholesterol Cal 10 5 - 40 mg/dL   LDL Calculated 96 0 - 99 mg/dL   Chol/HDL Ratio 3.3 0.0 - 4.4 ratio units  Hepatic function panel  Result Value Ref Range   Total Protein 6.0 6.0 - 8.5 g/dL   Albumin 3.9 3.6 - 4.8 g/dL   Bilirubin Total 0.5 0.0 - 1.2 mg/dL   Bilirubin, Direct 0.13 0.00 - 0.40 mg/dL   Alkaline Phosphatase 71 39 - 117 IU/L   AST 16 0 - 40 IU/L   ALT 20 0 - 32 IU/L  Basic metabolic panel  Result Value Ref Range   Glucose 64 (L) 65 - 99 mg/dL   BUN 8 8 - 27 mg/dL   Creatinine, Ser 0.70 0.57 - 1.00 mg/dL   GFR calc non Af Amer 92 >59 mL/min/1.73   GFR calc Af Amer 106 >59 mL/min/1.73   BUN/Creatinine Ratio 11 11 - 26   Sodium 142 136 - 144 mmol/L   Potassium 4.8 3.5 - 5.2 mmol/L   Chloride 104 97 - 106 mmol/L   CO2 25 18 - 29 mmol/L   Calcium 10.2 8.7 - 10.3 mg/dL

## 2015-08-25 ENCOUNTER — Encounter: Payer: Self-pay | Admitting: Adult Health

## 2015-08-25 ENCOUNTER — Ambulatory Visit (INDEPENDENT_AMBULATORY_CARE_PROVIDER_SITE_OTHER): Payer: BLUE CROSS/BLUE SHIELD | Admitting: Adult Health

## 2015-08-25 VITALS — BP 128/64 | HR 68 | Ht 64.0 in | Wt 213.5 lb

## 2015-08-25 DIAGNOSIS — R319 Hematuria, unspecified: Secondary | ICD-10-CM

## 2015-08-25 DIAGNOSIS — R102 Pelvic and perineal pain: Secondary | ICD-10-CM | POA: Diagnosis not present

## 2015-08-25 HISTORY — DX: Hematuria, unspecified: R31.9

## 2015-08-25 HISTORY — DX: Pelvic and perineal pain: R10.2

## 2015-08-25 LAB — POCT URINALYSIS DIPSTICK
GLUCOSE UA: NEGATIVE
LEUKOCYTES UA: NEGATIVE
NITRITE UA: NEGATIVE

## 2015-08-25 MED ORDER — NITROFURANTOIN MONOHYD MACRO 100 MG PO CAPS: 100.0000 mg | ORAL_CAPSULE | Freq: Two times a day (BID) | ORAL | Status: DC

## 2015-08-25 NOTE — Progress Notes (Signed)
Subjective:     Patient ID: [name], female   DOB: 1951-08-08, 65 y.o.   MRN: [id number]  HPI [name] is a 65 year old white female in complaining of pelvic and low stomach pain for about a week, started after sex, has pain with urination too, no nausea,vomiting or diarrhea. No pain during sex.She says muscle relaxer seems to help some with pain.   Review of Systems Patient denies any headaches, hearing loss, fatigue, blurred vision, shortness of breath, chest pain,  problems with bowel movements, or intercourse. No joint pain or mood swings, No nausea,vomiting or diarrhea.See HPI for positives Reviewed past medical,surgical, social and family history. Reviewed medications and allergies.     Objective:   Physical Exam BP 128/64 mmHg  Pulse 68  Ht 5\' 4"  (1.626 m)  Wt 213 lb 8 oz (96.843 kg)  BMI 36.63 kg/m2 urine dipstick trace blood, trace protein, 1+ ketones, Skin warm and dry.Pelvic: external genitalia is normal in appearance no lesions, vagina:pale with fair moisture, loss of rugae,urethra has no lesions or masses noted, cervix:smooth and atrophic, uterus: normal size, shape and contour,mildly tender, no masses felt, adnexa: no masses, mild tenderness noted. Bladder is mildly tender and no masses felt.Abdomen soft, mildly tender at navel ,no HSM noted, declines pain meds.     Assessment:    Pelvic pain Hematuria    Plan:     Rx macrobid 1 bid x 7 days #14 UA C&S sent Return in 1 day for gyn [place] Push water   Review handout on pelvic pain

## 2015-08-25 NOTE — Patient Instructions (Signed)
Pelvic Pain, Female Female pelvic pain can be caused by many different things and start from a variety of places. Pelvic pain refers to pain that is located in the lower half of the abdomen and between your hips. The pain may occur over a short period of time (acute) or may be reoccurring (chronic). The cause of pelvic pain may be related to disorders affecting the female reproductive organs (gynecologic), but it may also be related to the bladder, kidney stones, an intestinal complication, or muscle or skeletal problems. Getting help right away for pelvic pain is important, especially if there has been severe, sharp, or a sudden onset of unusual pain. It is also important to get help right away because some types of pelvic pain can be life threatening.  CAUSES  Below are only some of the causes of pelvic pain. The causes of pelvic pain can be in one of several categories.   Gynecologic.  Pelvic inflammatory disease.  Sexually transmitted infection.  Ovarian cyst or a twisted ovarian ligament (ovarian torsion).  Uterine lining that grows outside the uterus (endometriosis).  Fibroids, cysts, or tumors.  Ovulation.  Pregnancy.  Pregnancy that occurs outside the uterus (ectopic pregnancy).  Miscarriage.  Labor.  Abruption of the placenta or ruptured uterus.  Infection.  Uterine infection (endometritis).  Bladder infection.  Diverticulitis.  Miscarriage related to a uterine infection (septic abortion).  Bladder.  Inflammation of the bladder (cystitis).  Kidney stone(s).  Gastrointestinal.  Constipation.  Diverticulitis.  Neurologic.  Trauma.  Feeling pelvic pain because of mental or emotional causes (psychosomatic).  Cancers of the bowel or pelvis. EVALUATION  Your caregiver will want to take a careful history of your concerns. This includes recent changes in your health, a careful gynecologic history of your periods (menses), and a sexual history. Obtaining  your family history and medical history is also important. Your caregiver may suggest a pelvic exam. A pelvic exam will help identify the location and severity of the pain. It also helps in the evaluation of which organ system may be involved. In order to identify the cause of the pelvic pain and be properly treated, your caregiver may order tests. These tests may include:   A pregnancy test.  Pelvic ultrasonography.  An X-ray exam of the abdomen.  A urinalysis or evaluation of vaginal discharge.  Blood tests. HOME CARE INSTRUCTIONS   Only take over-the-counter or prescription medicines for pain, discomfort, or fever as directed by your caregiver.   Rest as directed by your caregiver.   Eat a balanced diet.   Drink enough fluids to make your urine clear or pale yellow, or as directed.   Avoid sexual intercourse if it causes pain.   Apply warm or cold compresses to the lower abdomen depending on which one helps the pain.   Avoid stressful situations.   Keep a journal of your pelvic pain. Write down when it started, where the pain is located, and if there are things that seem to be associated with the pain, such as food or your menstrual cycle.  Follow up with your caregiver as directed.  SEEK MEDICAL CARE IF:  Your medicine does not help your pain.  You have abnormal vaginal discharge. SEEK IMMEDIATE MEDICAL CARE IF:   You have heavy bleeding from the vagina.   Your pelvic pain increases.   You feel light-headed or faint.   You have chills.   You have pain with urination or blood in your urine.   You have uncontrolled  diarrhea or vomiting.   You have a fever or persistent symptoms for more than 3 days.  You have a fever and your symptoms suddenly get worse.   You are being physically or sexually abused.   This information is not intended to replace advice given to you by your health care provider. Make sure you discuss any questions you have with  your health care provider.   Document Released: 07/04/2004 Document Revised: 04/28/2015 Document Reviewed: 11/27/2011 Elsevier Interactive Patient Education 2016 [employer]. Increase water Take macrobid  [place] in am

## 2015-08-26 ENCOUNTER — Ambulatory Visit (INDEPENDENT_AMBULATORY_CARE_PROVIDER_SITE_OTHER): Payer: BLUE CROSS/BLUE SHIELD

## 2015-08-26 DIAGNOSIS — R102 Pelvic and perineal pain: Secondary | ICD-10-CM

## 2015-08-26 LAB — MICROSCOPIC EXAMINATION
Casts: NONE SEEN /lpf
WBC, UA: >30 /hpf — AB (ref 0–?)

## 2015-08-26 LAB — URINALYSIS, ROUTINE W REFLEX MICROSCOPIC
BILIRUBIN UA: NEGATIVE
GLUCOSE, UA: NEGATIVE
NITRITE UA: NEGATIVE
RBC UA: NEGATIVE
SPEC GRAV UA: 1.018 (ref 1.005–1.030)
Urobilinogen, Ur: 0.2 mg/dL (ref 0.2–1.0)
pH, UA: 7 (ref 5.0–7.5)

## 2015-08-26 NOTE — Progress Notes (Signed)
PELVIC US TA/TV: homogenous retroverted uterus,normal ov's bilat (mobile),EEC 2 mm with a small amount of fluid w/in the endometrium and a 4.3 x 4 x 3.9mm polyp,small amount of cul de sac fluid,pain on rt during ultrasound

## 2015-08-27 ENCOUNTER — Telehealth: Payer: Self-pay | Admitting: Adult Health

## 2015-08-27 LAB — URINE CULTURE

## 2015-08-27 NOTE — Telephone Encounter (Signed)
Pt aware of [place] and urine culture, feels some better finish med

## 2015-12-20 ENCOUNTER — Other Ambulatory Visit: Payer: Self-pay | Admitting: Family Medicine

## 2015-12-20 ENCOUNTER — Ambulatory Visit (INDEPENDENT_AMBULATORY_CARE_PROVIDER_SITE_OTHER): Payer: BLUE CROSS/BLUE SHIELD | Admitting: Family Medicine

## 2015-12-20 ENCOUNTER — Encounter: Payer: Self-pay | Admitting: Family Medicine

## 2015-12-20 VITALS — BP 120/78 | Ht 65.0 in | Wt 209.0 lb

## 2015-12-20 DIAGNOSIS — R296 Repeated falls: Secondary | ICD-10-CM | POA: Diagnosis not present

## 2015-12-20 MED ORDER — ALBUTEROL SULFATE HFA 108 (90 BASE) MCG/ACT IN AERS: 2.0000 | INHALATION_SPRAY | Freq: Four times a day (QID) | RESPIRATORY_TRACT | Status: DC | PRN

## 2015-12-20 MED ORDER — ATORVASTATIN CALCIUM 40 MG PO TABS: 40.0000 mg | ORAL_TABLET | Freq: Every day | ORAL | Status: DC

## 2015-12-20 MED ORDER — CLOTRIMAZOLE-BETAMETHASONE 1-0.05 % EX CREA: 1.0000 "application " | TOPICAL_CREAM | Freq: Two times a day (BID) | CUTANEOUS | Status: DC

## 2015-12-20 MED ORDER — LISINOPRIL 10 MG PO TABS: 10.0000 mg | ORAL_TABLET | Freq: Every day | ORAL | Status: DC

## 2015-12-20 MED ORDER — CHLORZOXAZONE 500 MG PO TABS: 500.0000 mg | ORAL_TABLET | Freq: Three times a day (TID) | ORAL | Status: DC | PRN

## 2015-12-20 NOTE — Progress Notes (Signed)
   Subjective:    Patient ID: [name], female    DOB: [birth year], 65 y.o.   MRN: [id number] Patient arrives office with numerous concerns some acute some subacute some chronic HPI  Patient arrives with c/o rash on bottom of right foot/toes-itches  Pt saw rheum fo awhile  Saw mckinney about toes, he rec sleeves on toes, in hopes of preventing calluses and blisters  Pt developed itchiness in the bottom of the foot  Pt talked to the nurse, usoing caladryl prn rc  Itchy and blisters,  BP numbers overall running pretty good, generally numberw ar elev. Claims compliance with blood pressure medication. No obvious side effects. Watching   Patient reports frequent fall. 5 or 6 times in the last Year. No loss of consciousness before. No true dizziness before. Just often finds herself in a situation where she falls. Complicated by rheumatoid arthritis. In complicated by general non-conditioning patient has not been exercise. Also cocktail obesity   Reg exercise three to four d pr wk,  Sticking with bp meds now  Lipitor. Walks forty to fifty min, usually at lwe[place] a quarther of a mil e   Diet is generlly good, not so bad this winter   Not using inhaler at all ,       Review of Systems    No headache, no major weight loss or weight gain, no chest pan alert alert   Physical exam obesity present blood pressure good on repeat HEENT normal lungs clear. Heart regular in rhythm. Ankles without edema. Obesity noted. Changes rheumatoid arthritis noted. Toes show some scaliness and slight blistering at site of sleeves the patient wears over deformed toes next  Impression rash #2 recurrent falls likely due to deconditioning and rheumatoid arthritis discussed #3 hypertension overall good control #4 hyperlipidemia discussed status uncertain plan appropriate blood work. Physical therapy referral rationale discussed. Diet exercise discussed. Check in 6 months. [facility]    no back pain abdominal  pain no change in bowel habits complete ROS otherwise negative  Objective:   Physical Exam        Assessment & Plan:

## 2015-12-21 LAB — LIPID PANEL W/O CHOL/HDL RATIO
Cholesterol, Total: 179 mg/dL (ref 100–199)
HDL: 65 mg/dL (ref >39)
LDL CALC: 106 mg/dL — AB (ref 0–99)
Triglycerides: 40 mg/dL (ref 0–149)
VLDL CHOLESTEROL CAL: 8 mg/dL (ref 5–40)

## 2015-12-22 ENCOUNTER — Telehealth: Payer: Self-pay | Admitting: Family Medicine

## 2015-12-22 NOTE — Telephone Encounter (Signed)
proair fine

## 2015-12-22 NOTE — Telephone Encounter (Signed)
Received a fax from patient's pharmacy stating that ProAir or ProAir Respiclick are preferred drugs of choice per patient's insurance. Please advise?

## 2015-12-23 ENCOUNTER — Encounter: Payer: Self-pay | Admitting: Family Medicine

## 2015-12-23 MED ORDER — ALBUTEROL SULFATE HFA 108 (90 BASE) MCG/ACT IN AERS: 2.0000 | INHALATION_SPRAY | Freq: Four times a day (QID) | RESPIRATORY_TRACT | Status: DC | PRN

## 2015-12-23 NOTE — Addendum Note (Signed)
Addended by: Launa Grill on: 12/23/2015 08:44 AM   Modules accepted: Orders

## 2015-12-23 NOTE — Telephone Encounter (Signed)
ProAir sent into pharmacy per Dr.Steve Luking.

## 2016-01-03 ENCOUNTER — Ambulatory Visit (HOSPITAL_COMMUNITY): Payer: BLUE CROSS/BLUE SHIELD | Attending: Family Medicine | Admitting: Physical Therapy

## 2016-01-03 ENCOUNTER — Encounter (HOSPITAL_COMMUNITY): Payer: Self-pay | Admitting: Physical Therapy

## 2016-01-03 DIAGNOSIS — R2681 Unsteadiness on feet: Secondary | ICD-10-CM | POA: Insufficient documentation

## 2016-01-03 DIAGNOSIS — Z9181 History of falling: Secondary | ICD-10-CM | POA: Diagnosis present

## 2016-01-04 NOTE — Therapy (Signed)
[facility] [place], [place], [address] Phone: [phone]   Fax:  [phone]  Physical Therapy Evaluation  Patient Details  Name: [name] MRN: [id number] Date of Birth: [birth year] Referring Provider: Baltazar Apo, MD  Encounter Date: 01/03/2016      PT End of Session - 01/03/16 1230    Visit Number 1   Number of Visits 1   Authorization Type [employer]   Authorization Time Period 01/03/16   PT Start Time 0817   PT Stop Time 0900   PT Time Calculation (min) 43 min   Activity Tolerance Patient tolerated treatment well   Behavior During Therapy [facility] for tasks assessed/performed      Past Medical History  Diagnosis Date  . Asthma   . Hypertension   . Hyperlipidemia   . Allergic rhinitis   . Arthritis   . Obesity   . Pelvic pain in female 08/25/2015  . Hematuria 08/25/2015    Past Surgical History  Procedure Laterality Date  . Back surgery    . Tonsillectomy and adenoidectomy    . Cholecystectomy    . Knee surgery      bilateral knee replacement  . Colonoscopy  2007    sessile sigmoid polyp x 1; path: serrated adenoma  . Lipoma excision      x 2  . Colonoscopy N/A 10/12/2014    Procedure: COLONOSCOPY;  Surgeon: [name], MD;  Location: [facility];  Service: Endoscopy;  Laterality: N/A;    There were no vitals filed for this visit.       Subjective Assessment - 01/03/16 0826    Subjective Pt states her husband encouraged her to notify her PCP concerning her falls. She reports she has fallen ~4x in the past 6 months. She did not have any major injuries from her falls. She notes that in the past 6 months her Rt foot has had 3 toes that have curled under and feels that is affecting her balance.    Pertinent History RA, HTn, HLD, Spinal fusion L4/L5, B TKA   How long can you sit comfortably? n/a   How long can you stand comfortably? n/a   How long can you walk comfortably? n/a   Diagnostic tests  None   Patient Stated Goals improve balance    Currently in Pain? No/denies            [facility] PT Assessment - 01/04/16 0001    Assessment   Medical Diagnosis Unsteady gait; frequent falls   Referring Provider Baltazar Apo, MD   Next MD Visit 6 months   Prior Therapy None for balance    Precautions   Precautions None   Restrictions   Weight Bearing Restrictions No   Balance Screen   Has the patient fallen in the past 6 months Yes   How many times? 4   Has the patient had a decrease in activity level because of a fear of falling?  Yes   Is the patient reluctant to leave their home because of a fear of falling?  No   Home Environment   Living Environment Private residence   Living Arrangements Spouse/significant other   [place] to enter  7 STE with 2 handrails   Prior Function   Level of Independence Independent   Vocation Retired   Leisure going on walks, Editor, commissioning, church activity   Strength   Right Hip Flexion 4/5   Right Hip Extension  4/5   Right Hip ABduction 4+/5   Left Hip Flexion 4/5   Left Hip Extension 4/5   Left Hip ABduction 4+/5   Right Knee Flexion 5/5   Right Knee Extension 5/5   Left Knee Flexion 5/5   Left Knee Extension 5/5   Right Ankle Dorsiflexion 5/5   Left Ankle Dorsiflexion 5/5   Transfers   Five time sit to stand comments  10.78   Ambulation/Gait   Ambulation/Gait Yes   Ambulation/Gait Assistance 7: Independent   Gait Pattern Within Functional Limits   Standardized Balance Assessment   Standardized Balance Assessment Berg Balance Test;Dynamic Gait Index   Berg Balance Test   Sit to Stand Able to stand without using hands and stabilize independently   Standing Unsupported Able to stand safely 2 minutes   Sitting with Back Unsupported but Feet Supported on Floor or Stool Able to sit safely and securely 2 minutes   Stand to Sit Sits safely with minimal use of hands   Transfers Able to transfer safely, minor use of  hands   Standing Unsupported with Eyes Closed Able to stand 10 seconds safely   Standing Ubsupported with Feet Together Able to place feet together independently and stand 1 minute safely   From Standing, Reach Forward with Outstretched Arm Can reach forward >12 cm safely (5")   From Standing Position, Pick up Object from Floor Able to pick up shoe safely and easily   From Standing Position, Turn to Look Behind Over each Shoulder Looks behind from both sides and weight shifts well   Turn 360 Degrees Able to turn 360 degrees safely in 4 seconds or less   Standing Unsupported, Alternately Place Feet on Step/Stool Able to stand independently and safely and complete 8 steps in 20 seconds   Standing Unsupported, One Foot in Front Able to place foot tandem independently and hold 30 seconds   Standing on One Leg Able to lift leg independently and hold 5-10 seconds  L: 5 sec, R: 10 sec   Total Score 54   Dynamic Gait Index   Level Surface Normal   Change in Gait Speed Normal   Gait with Horizontal Head Turns Normal   Gait with Vertical Head Turns Normal   Gait and Pivot Turn Normal   Step Over Obstacle Normal   Step Around Obstacles Normal   Steps Normal   Total Score 24                           PT Education - 01/03/16 1229    Education provided Yes   Education Details reviewed eval findings; discussed results of functional testing; encouraged pt to notify MD regarding reported vertigo for a new order if this continues to be an issue for her; HEP of SLS, tandem stance holds as long as she can to improve balance   Person(s) Educated Patient   Methods Explanation;Demonstration   Comprehension Verbalized understanding;Returned demonstration          PT Short Term Goals - 01/03/16 1231    PT SHORT TERM GOAL #1   Title Pt will verbalize understanding of HEP   Time 1   Period Days   Status New           PT Long Term Goals - 01/03/16 1231    PT LONG TERM GOAL #1    Title n/a due to 1 time visit  Plan - 01/04/16 0801    Clinical Impression Statement Pt was referred to OPPT for management of unsteadiness of gait and decreased balance. Upon examination, she reports 4 falls in the past 6 months, she has good strength throughout BLE, sensation appears intact, and overall her balance is good. She performed at age appropriate levels on all functional standardized testing including Berg balance, 5x sit to stand and DGI without any LOB, noted unsteadiness. She was able to change directions, negotiate obstacles and demonstrate good dynamic balance throughout the evaluation. At this time, her primary issue is reported positional vertigo which therapist encouraged her to speak with her MD concerning a new order. Therapist discussed eval findings and POC with pt and she verbalized understanding and is in agreement with d/c at this time due to performing at a safe level of independence. Will d/c pt at this time with HEP to address minor balance concerns at home.    PT Frequency One time visit   PT Treatment/Interventions ADLs/Self Care Home Management   PT Next Visit Plan d/c   Recommended Other Services none   Consulted and Agree with Plan of Care Patient      Patient will benefit from skilled therapeutic intervention in order to improve the following deficits and impairments:     Visit Diagnosis: Unsteadiness on feet  History of falling     Problem List Patient Active Problem List   Diagnosis Date Noted  . Pelvic pain in female 08/25/2015  . Hematuria 08/25/2015  . History of adenomatous polyp of colon 10/01/2014  . Rheumatoid factor positive 07/20/2014  . History of juvenile rheumatoid arthritis 07/20/2014  . Impaired fasting glucose 07/20/2014  . NEOPLASM, SKIN, UNCERTAIN BEHAVIOR 0000000  . DEGENERATIVE JOINT DISEASE, RIGHT KNEE 09/23/2008  . CALF PAIN, RIGHT 09/23/2008  . ANXIETY DEPRESSION 08/07/2008  . OBESITY 12/19/2007   . [place] DISEASE, LUMBOSACRAL SPINE 06/27/2007  . OSTEOPENIA 06/27/2007  . HIP PAIN, LEFT 06/18/2007  . CONSTIPATION 05/16/2007  . Essential hypertension 04/04/2007  . PSORIASIS 03/07/2007  . OSTEOARTHROSIS, GENERALIZED, MULTIPLE SITES 03/07/2007  . ANOSMIA 03/07/2007  . LIPOMA 02/21/2007  . Hyperlipidemia LDL goal <130 02/21/2007  . ALLERGIC RHINITIS 02/21/2007  . ASTHMA 02/21/2007  . ARTHRITIS 02/21/2007  . KNEE PAIN, LEFT 02/21/2007  . LOW BACK PAIN, CHRONIC 02/21/2007    10:01 AM,01/04/2016 Elly Modena PT, DPT [facility] [place], [place], [address] Phone: [phone]   Fax:  [phone]  Name: [name] MRN: [id number] Date of Birth: 1951/07/16

## 2016-01-06 ENCOUNTER — Encounter: Payer: Self-pay | Admitting: Family Medicine

## 2016-01-06 DIAGNOSIS — R42 Dizziness and giddiness: Secondary | ICD-10-CM

## 2016-01-06 NOTE — Telephone Encounter (Signed)
Discussed with pt. Order for ent put in.

## 2016-01-11 ENCOUNTER — Ambulatory Visit: Payer: BLUE CROSS/BLUE SHIELD | Admitting: Family Medicine

## 2016-04-06 ENCOUNTER — Ambulatory Visit (INDEPENDENT_AMBULATORY_CARE_PROVIDER_SITE_OTHER): Payer: BLUE CROSS/BLUE SHIELD | Admitting: Otolaryngology

## 2016-04-06 DIAGNOSIS — H8112 Benign paroxysmal vertigo, left ear: Secondary | ICD-10-CM

## 2016-06-21 ENCOUNTER — Encounter: Payer: Self-pay | Admitting: Family Medicine

## 2016-06-21 ENCOUNTER — Ambulatory Visit (INDEPENDENT_AMBULATORY_CARE_PROVIDER_SITE_OTHER): Payer: BLUE CROSS/BLUE SHIELD | Admitting: Family Medicine

## 2016-06-21 VITALS — BP 122/80 | Ht 65.0 in | Wt 225.2 lb

## 2016-06-21 DIAGNOSIS — I10 Essential (primary) hypertension: Secondary | ICD-10-CM

## 2016-06-21 DIAGNOSIS — R1310 Dysphagia, unspecified: Secondary | ICD-10-CM

## 2016-06-21 DIAGNOSIS — R0989 Other specified symptoms and signs involving the circulatory and respiratory systems: Secondary | ICD-10-CM

## 2016-06-21 DIAGNOSIS — E785 Hyperlipidemia, unspecified: Secondary | ICD-10-CM

## 2016-06-21 DIAGNOSIS — R6889 Other general symptoms and signs: Secondary | ICD-10-CM

## 2016-06-21 MED ORDER — ATORVASTATIN CALCIUM 40 MG PO TABS: 40.0000 mg | ORAL_TABLET | Freq: Every day | ORAL | 1 refills | Status: DC

## 2016-06-21 MED ORDER — LISINOPRIL 10 MG PO TABS: 10.0000 mg | ORAL_TABLET | Freq: Every day | ORAL | 1 refills | Status: DC

## 2016-06-21 NOTE — Progress Notes (Signed)
   Subjective:    Patient ID: [name], female    DOB: 27-Dec-1950, 65 y.o.   MRN: [id number] Patient arrives office with numerous concerns Hypertension  This is a chronic problem. The current episode started more than 1 year ago. The problem has been gradually improving since onset. There are no associated agents to hypertension. There are no known risk factors for coronary artery disease. Treatments tried: lisinopril. The current treatment provides moderate improvement. There are no compliance problems.    Patient states that she has a "constant feeling of having something stuck in her throat". Difficulty swallowing noted.   Patient continues to take lipid medication regularly. No obvious side effects from it. Generally does not miss a dose. Prior blood work results are reviewed with patient. Patient continues to work on fat intake in diet  Blood pressure medicine and blood pressure levels reviewed today with patient. Compliant with blood pressure medicine. States does not miss a dose. No obvious side effects. Blood pressure generally good when checked elsewhere. Watching salt intake.  No food stuck no vomiting No hx of known reflux     Sometimes notes trouble swallowing and something caught on the throat  No major challenge with this in the past,  No true dysphagia   Review of Systems No headache, no major weight loss or weight gain, no chest pain no back pain abdominal pain no change in bowel habits complete ROS otherwise negative     Objective:   Physical Exam  Alert vitals stable, NAD. Blood pressure good on repeat. HEENT normal. Lungs clear. Heart regular rate and rhythm. Pharyngeal exam normal neck supple      Assessment & Plan:  Impression pill dysphagia was sense of fullness in throat. Discussed at great length. Patient wishes to press on with workup #2 hypertension good control discussed maintain same #3 hyperlipidemia good control discussed maintain same plan  diet exercise discussed. Blood work ordered. This will complement rheumatologist blood work. ENT referral per patient request

## 2016-06-22 ENCOUNTER — Encounter: Payer: Self-pay | Admitting: Family Medicine

## 2016-06-22 LAB — LIPID PANEL
CHOLESTEROL TOTAL: 190 mg/dL (ref 100–199)
Chol/HDL Ratio: 3.2 ratio units (ref 0.0–4.4)
HDL: 60 mg/dL (ref >39)
LDL CALC: 121 mg/dL — AB (ref 0–99)
TRIGLYCERIDES: 45 mg/dL (ref 0–149)
VLDL CHOLESTEROL CAL: 9 mg/dL (ref 5–40)

## 2016-06-25 ENCOUNTER — Encounter: Payer: Self-pay | Admitting: Family Medicine

## 2016-07-10 ENCOUNTER — Ambulatory Visit (INDEPENDENT_AMBULATORY_CARE_PROVIDER_SITE_OTHER): Payer: BLUE CROSS/BLUE SHIELD | Admitting: Otolaryngology

## 2016-07-10 DIAGNOSIS — R490 Dysphonia: Secondary | ICD-10-CM | POA: Diagnosis not present

## 2016-07-10 DIAGNOSIS — R1312 Dysphagia, oropharyngeal phase: Secondary | ICD-10-CM

## 2016-07-13 ENCOUNTER — Other Ambulatory Visit: Payer: Self-pay | Admitting: Family Medicine

## 2016-08-10 ENCOUNTER — Ambulatory Visit (INDEPENDENT_AMBULATORY_CARE_PROVIDER_SITE_OTHER): Payer: BLUE CROSS/BLUE SHIELD | Admitting: Otolaryngology

## 2016-08-10 DIAGNOSIS — R1312 Dysphagia, oropharyngeal phase: Secondary | ICD-10-CM | POA: Diagnosis not present

## 2016-08-10 DIAGNOSIS — K219 Gastro-esophageal reflux disease without esophagitis: Secondary | ICD-10-CM | POA: Diagnosis not present

## 2016-08-10 DIAGNOSIS — R070 Pain in throat: Secondary | ICD-10-CM

## 2016-10-31 ENCOUNTER — Telehealth: Payer: Self-pay | Admitting: Family Medicine

## 2016-10-31 DIAGNOSIS — R768 Other specified abnormal immunological findings in serum: Secondary | ICD-10-CM

## 2016-10-31 NOTE — Telephone Encounter (Signed)
Hi brendale send a couple names back my way which are available to new patients

## 2016-10-31 NOTE — Telephone Encounter (Signed)
Pt's current Rheumatologist is retiring and pt needs to be referred to another rheumatologist  Pt would like to know who you would recommend? Please advise

## 2016-11-01 NOTE — Telephone Encounter (Signed)
Dr. Estanislado Pandy @ [facility] Dr. Amil Amen & Dr. Trudie Reed @ [facility] Dr. Dossie Der @ [facility]

## 2016-11-01 NOTE — Telephone Encounter (Signed)
Ok I rec any of them perhaps the first three

## 2016-11-09 ENCOUNTER — Ambulatory Visit (INDEPENDENT_AMBULATORY_CARE_PROVIDER_SITE_OTHER): Payer: BLUE CROSS/BLUE SHIELD | Admitting: Otolaryngology

## 2016-11-09 DIAGNOSIS — R1312 Dysphagia, oropharyngeal phase: Secondary | ICD-10-CM

## 2016-11-09 DIAGNOSIS — K219 Gastro-esophageal reflux disease without esophagitis: Secondary | ICD-10-CM | POA: Diagnosis not present

## 2016-11-11 ENCOUNTER — Other Ambulatory Visit: Payer: Self-pay | Admitting: Family Medicine

## 2016-12-11 ENCOUNTER — Encounter: Payer: Self-pay | Admitting: Family Medicine

## 2016-12-11 DIAGNOSIS — Z79899 Other long term (current) drug therapy: Secondary | ICD-10-CM

## 2016-12-11 DIAGNOSIS — I10 Essential (primary) hypertension: Secondary | ICD-10-CM

## 2016-12-11 DIAGNOSIS — E785 Hyperlipidemia, unspecified: Secondary | ICD-10-CM

## 2016-12-19 ENCOUNTER — Ambulatory Visit: Payer: BLUE CROSS/BLUE SHIELD | Admitting: Family Medicine

## 2016-12-21 LAB — BASIC METABOLIC PANEL
BUN/Creatinine Ratio: 19 (ref 12–28)
BUN: 16 mg/dL (ref 8–27)
CO2: 24 mmol/L (ref 18–29)
CREATININE: 0.85 mg/dL (ref 0.57–1.00)
Calcium: 9.9 mg/dL (ref 8.7–10.3)
Chloride: 101 mmol/L (ref 96–106)
GFR calc Af Amer: 83 mL/min/{1.73_m2} (ref >59)
GFR calc non Af Amer: 72 mL/min/{1.73_m2} (ref >59)
GLUCOSE: 90 mg/dL (ref 65–99)
POTASSIUM: 5 mmol/L (ref 3.5–5.2)
SODIUM: 138 mmol/L (ref 134–144)

## 2016-12-21 LAB — LIPID PANEL
Chol/HDL Ratio: 3.4 ratio (ref 0.0–4.4)
Cholesterol, Total: 190 mg/dL (ref 100–199)
HDL: 56 mg/dL (ref >39)
LDL Calculated: 126 mg/dL — ABNORMAL HIGH (ref 0–99)
Triglycerides: 42 mg/dL (ref 0–149)
VLDL CHOLESTEROL CAL: 8 mg/dL (ref 5–40)

## 2016-12-21 LAB — HEPATIC FUNCTION PANEL
ALK PHOS: 100 IU/L (ref 39–117)
ALT: 82 IU/L — ABNORMAL HIGH (ref 0–32)
AST: 47 IU/L — AB (ref 0–40)
Albumin: 4 g/dL (ref 3.6–4.8)
Bilirubin Total: 0.4 mg/dL (ref 0.0–1.2)
Bilirubin, Direct: 0.15 mg/dL (ref 0.00–0.40)
TOTAL PROTEIN: 6.1 g/dL (ref 6.0–8.5)

## 2016-12-25 ENCOUNTER — Encounter: Payer: Self-pay | Admitting: Family Medicine

## 2016-12-25 ENCOUNTER — Ambulatory Visit (INDEPENDENT_AMBULATORY_CARE_PROVIDER_SITE_OTHER): Payer: BLUE CROSS/BLUE SHIELD | Admitting: Family Medicine

## 2016-12-25 VITALS — BP 142/80 | Ht 65.0 in | Wt 239.0 lb

## 2016-12-25 DIAGNOSIS — I10 Essential (primary) hypertension: Secondary | ICD-10-CM

## 2016-12-25 DIAGNOSIS — M05742 Rheumatoid arthritis with rheumatoid factor of left hand without organ or systems involvement: Secondary | ICD-10-CM

## 2016-12-25 DIAGNOSIS — E785 Hyperlipidemia, unspecified: Secondary | ICD-10-CM

## 2016-12-25 DIAGNOSIS — M05741 Rheumatoid arthritis with rheumatoid factor of right hand without organ or systems involvement: Secondary | ICD-10-CM | POA: Diagnosis not present

## 2016-12-25 NOTE — Progress Notes (Signed)
Subjective:    Patient ID: [name], female    DOB: 1950/10/30, 66 y.o.   MRN: [id number]  Hypertension  This is a chronic problem. The current episode started more than 1 year ago. Treatments tried: lisinopril.   Referral for rheumatoid arthritis. Specialist is retiring and needs a new one.   Results for orders placed or performed in visit on 12/11/16  Lipid panel  Result Value Ref Range   Cholesterol, Total 190 100 - 199 mg/dL   Triglycerides 42 0 - 149 mg/dL   HDL 56 >39 mg/dL   VLDL Cholesterol Cal 8 5 - 40 mg/dL   LDL Calculated 126 (H) 0 - 99 mg/dL   Chol/HDL Ratio 3.4 0.0 - 4.4 ratio  Hepatic function panel  Result Value Ref Range   Total Protein 6.1 6.0 - 8.5 g/dL   Albumin 4.0 3.6 - 4.8 g/dL   Bilirubin Total 0.4 0.0 - 1.2 mg/dL   Bilirubin, Direct 0.15 0.00 - 0.40 mg/dL   Alkaline Phosphatase 100 39 - 117 IU/L   AST 47 (H) 0 - 40 IU/L   ALT 82 (H) 0 - 32 IU/L  Basic metabolic panel  Result Value Ref Range   Glucose 90 65 - 99 mg/dL   BUN 16 8 - 27 mg/dL   Creatinine, Ser 0.85 0.57 - 1.00 mg/dL   GFR calc non Af Amer 72 >59 mL/min/1.73   GFR calc Af Amer 83 >59 mL/min/1.73   BUN/Creatinine Ratio 19 12 - 28   Sodium 138 134 - 144 mmol/L   Potassium 5.0 3.5 - 5.2 mmol/L   Chloride 101 96 - 106 mmol/L   CO2 24 18 - 29 mmol/L   Calcium 9.9 8.7 - 10.3 mg/dL   Pt having a t of dificulty wih rheum arthritis. Pt still on the methotrexate, pt came off the plaquenil after it stopped, assumed dr Charlestine Night, uses pred as needed. No difficulty with plaquenil in the past   BP pt cks it sometimes overall good, sometimes not  Pt quit going to the gym  Right hip and back in a lot of pain, pt wishes to see dr Estanislado Pandy     Blood pressure medicine and blood pressure levels reviewed today with patient. Compliant with blood pressure medicine. States does not miss a dose. No obvious side effects. Blood pressure generally good when checked elsewhere. Watching salt  intake.  Patient continues to take lipid medication regularly. No obvious side effects from it. Generally does not miss a dose. Prior blood work results are reviewed with patient. Patient continues to work on fat intake in diet    Review of Systems No headache, no major weight loss or weight gain, no chest pain no back pain abdominal pain no change in bowel habits complete ROS otherwise negative     Objective:   Physical Exam  Alert and oriented, vitals reviewed and stable, NAD ENT-TM's and ext canals WNL bilat via otoscopic exam Soft palate, tonsils and post pharynx WNL via oropharyngeal exam Neck-symmetric, no masses; thyroid nonpalpable and nontender Pulmonary-no tachypnea or accessory muscle use; Clear without wheezes via auscultation Card--no abnrml murmurs, rhythm reg and rate WNL Carotid pulses symmetric, without bruits Hands reveal changes consistent with rheumatoid arthritis      Assessment & Plan:  Impression 1 hypertension good control discussed maintain same meds and approach #2 hyperlipidemia blood work reviewed good control discussed maintain same #3 rheumatoid arthritis long discussion held. Patient request Dr. Estanislado Pandy. We will work  on this, meds refilled. Diet exercise discussed WSL

## 2017-01-01 ENCOUNTER — Encounter: Payer: Self-pay | Admitting: Family Medicine

## 2017-02-06 ENCOUNTER — Other Ambulatory Visit: Payer: Self-pay | Admitting: Family Medicine

## 2017-03-02 DIAGNOSIS — Z79899 Other long term (current) drug therapy: Secondary | ICD-10-CM | POA: Insufficient documentation

## 2017-03-02 NOTE — Progress Notes (Signed)
Office Visit Note  Patient: [name]             Date of Birth: 26-Aug-1950           MRN: [id number]             PCP: [name], MD Referring: [name], MD Visit Date: 03/06/2017 Occupation: '@GUAROCC' @    Subjective:  Pain in multiple joints   History of Present Illness: [name] is a 66 y.o. female with history of rheumatoid arthritis seen in consultation per request of her PCP. According to patient she had rheumatic fever as a child. In her 27s she started having pain in her bilateral lower extremities and saw several physicians. About 10 years ago she was referred to Dr. Charlestine Night by her PCP with suspicion of psoriatic arthritis. She states Dr. Charlestine Night diagnosed her with polyarthritis and no treatment was offered at the time. About 5 years ago she noticed that her hands and feet started turning and she went back to Dr. Charlestine Night who diagnosed her with rheumatoid arthritis and started her on prednisone and methotrexate combination. Prednisone was gradually tapered and she stayed on methotrexate. She had infrequent use of prednisone for flares. She states she would take 30 tablets over 1-2 years. Plaquenil was added about 9 months ago which she took it for 3 months and then ran out of the medication which was never filled. She had eye exam twice. She also had MRSA infection about 4 years ago without any recurrence. She states her arthritis is not controlled and she continues to have multiple flares and discomfort in her joints. She's also noticed that her arthritis is deteriorating over time.  Activities of Daily Living:  Patient reports morning stiffness for 2 hours.   Patient Reports nocturnal pain.  Difficulty dressing/grooming: Denies Difficulty climbing stairs: Denies Difficulty getting out of chair: Reports Difficulty using hands for taps, buttons, cutlery, and/or writing: Reports   Review of Systems  Constitutional: Positive for fatigue. Negative for  night sweats, weight gain, weight loss and weakness.  HENT: Positive for mouth dryness. Negative for mouth sores, trouble swallowing, trouble swallowing and nose dryness.   Eyes: Positive for dryness. Negative for pain, redness and visual disturbance.  Respiratory: Negative for cough, shortness of breath and difficulty breathing.   Cardiovascular: Positive for hypertension. Negative for chest pain, palpitations, irregular heartbeat and swelling in legs/feet.  Gastrointestinal: Positive for constipation. Negative for blood in stool and diarrhea.  Endocrine: Negative for increased urination.  Genitourinary: Negative for vaginal dryness.  Musculoskeletal: Positive for arthralgias, joint pain, joint swelling, myalgias, morning stiffness and myalgias. Negative for muscle weakness and muscle tenderness.  Skin: Positive for rash and sensitivity to sunlight. Negative for color change, hair loss, skin tightness and ulcers.  Allergic/Immunologic: Negative for susceptible to infections.  Neurological: Negative for dizziness, memory loss and night sweats.  Hematological: Negative for swollen glands.  Psychiatric/Behavioral: Positive for depressed mood and sleep disturbance. The patient is nervous/anxious.     PMFS History:  Patient Active Problem List   Diagnosis Date Noted  . High risk medication use 03/02/2017  . Hypercalcemia 03/02/2017  . Pelvic pain in female 08/25/2015  . Hematuria 08/25/2015  . History of adenomatous polyp of colon 10/01/2014  . Rheumatoid factor positive 07/20/2014  . History of juvenile rheumatoid arthritis 07/20/2014  . Impaired fasting glucose 07/20/2014  . NEOPLASM, SKIN, UNCERTAIN BEHAVIOR 08/09/7587  . Unilateral primary osteoarthritis, right knee 09/23/2008  . CALF PAIN,  RIGHT 09/23/2008  . ANXIETY DEPRESSION 08/07/2008  . OBESITY 12/19/2007  . DDD (degenerative disc disease), lumbar 06/27/2007  . Osteopenia 06/27/2007  . HIP PAIN, LEFT 06/18/2007  .  CONSTIPATION 05/16/2007  . Essential hypertension 04/04/2007  . Psoriasis 03/07/2007  . OSTEOARTHROSIS, GENERALIZED, MULTIPLE SITES 03/07/2007  . ANOSMIA 03/07/2007  . LIPOMA 02/21/2007  . Hyperlipidemia LDL goal <130 02/21/2007  . ALLERGIC RHINITIS 02/21/2007  . ASTHMA 02/21/2007  . ARTHRITIS 02/21/2007  . KNEE PAIN, LEFT 02/21/2007  . LOW BACK PAIN, CHRONIC 02/21/2007    Past Medical History:  Diagnosis Date  . Allergic rhinitis   . Arthritis   . Asthma   . Hematuria 08/25/2015  . Hyperlipidemia   . Hypertension   . Obesity   . Pelvic pain in female 08/25/2015    Family History  Problem Relation Age of Onset  . Heart attack Father   . Leukemia Mother   . Obesity Sister   . Other Sister        breathing problems  . Hepatitis C Sister   . Arthritis Sister   . Emphysema Brother   . Obesity Daughter   . Polycystic ovary syndrome Daughter   . Obesity Son   . Other Son        knee problems  . Cancer Maternal Grandmother        breast  . Other Brother        aneursym  . Arthritis Sister   . Obesity Son   . Asthma Son   . Obesity Son   . Diabetes Other        runs on dad's side of the family  . Colon cancer Neg Hx        states she doesn't know a lot about her family, but has never heard of specific colon ca   Past Surgical History:  Procedure Laterality Date  . BACK SURGERY    . CHOLECYSTECTOMY    . COLONOSCOPY  2007   sessile sigmoid polyp x 1; path: serrated adenoma  . COLONOSCOPY N/A 10/12/2014   Procedure: COLONOSCOPY;  Surgeon: [name], MD;  Location: [facility];  Service: Endoscopy;  Laterality: N/A;  . KNEE SURGERY     bilateral knee replacement  . LIPOMA EXCISION     x 2  . TONSILLECTOMY AND ADENOIDECTOMY     Social History   Social History Narrative  . No narrative on file     Objective: Vital Signs: BP (!) 128/95 (BP Location: Left Arm, Patient Position: Sitting, Cuff Size: Normal)   Pulse 86   Resp 17   Ht '5\' 5"'  (1.651 m)   Wt  243 lb (110.2 kg)   BMI 40.44 kg/m    Physical Exam  Constitutional: She is oriented to person, place, and time. She appears well-developed and well-nourished.  HENT:  Head: Normocephalic and atraumatic.  Eyes: Conjunctivae and EOM are normal.  Neck: Normal range of motion.  Cardiovascular: Normal rate, regular rhythm, normal heart sounds and intact distal pulses.   Pulmonary/Chest: Effort normal and breath sounds normal.  Abdominal: Soft. Bowel sounds are normal.  Lymphadenopathy:    She has no cervical adenopathy.  Neurological: She is alert and oriented to person, place, and time.  Skin: Skin is warm and dry. Capillary refill takes less than 2 seconds.  Psychiatric: She has a normal mood and affect. Her behavior is normal.  Nursing note and vitals reviewed.    Musculoskeletal Exam: He spun thoracic lumbar  spine good range of motion. Shoulder joints elbow joints are good range of motion. She has synovitis over her wrist joint and MCP joints as described below. She also has swelling over bilateral ankle joints.  CDAI Exam: CDAI Homunculus Exam:   Tenderness:  RUE: wrist LUE: wrist Right hand: 2nd MCP Left hand: 1st MCP RLE: tibiotalar LLE: tibiotalar  Swelling:  RUE: wrist LUE: wrist Right hand: 2nd MCP Left hand: 1st MCP RLE: tibiotalar LLE: tibiotalar  Joint Counts:  CDAI Tender Joint count: 4 CDAI Swollen Joint count: 4  Global Assessments:  Patient Global Assessment: 6 Provider Global Assessment: 6  CDAI Calculated Score: 20    Investigation: No additional findings.  Imaging: Xr Foot 2 Views Left  Result Date: 03/06/2017 All MTP joint narrowing was noted. Erosive changes noted in left second and fifth MTPs and the base of fifth metatarsal. Subluxation of all MTPs was noted. PIP/DIP joint narrowing was noted. Intertarsal joint space narrowing was noted. Subpatellar joint narrowing was noted. Calcaneal spur was noted. Impression: These findings are  consistent with severe rheumatoid arthritis with erosive changes.  Xr Foot 2 Views Right  Result Date: 03/06/2017 All MTP joint narrowing was noted. Erosive changes were noted in the right fifth MTP joint. Subluxation of most MTPs was noted. PIP/DIP narrowing was noted. Metatarsotarsal joint space narrowing was noted. Subtalar joint space narrowing noted. calcaneal spur was noted. Impression: These findings are consistent with rheumatoid arthritis with erosive change.  Xr Hand 2 View Left  Result Date: 03/06/2017 Juxta articular osteopenia noted. She has narrowing of left first and second MCP joint. All PIP/DIP narrowing was noted. Mild intercarpal joint space and radiocarpal joint space narrowing was noted. This possible erosion over the base of second phalanx. These settings are consistent with rheumatoid arthritis.  Xr Hand 2 View Right  Result Date: 03/06/2017 Severe right second and third MCP narrowing with invagination noted. PIP/DIP narrowing noted. Juxta articular osteopenia noted. No erosive changes noted. Intercarpal and radiocarpal joint space narrowing noted. Impression: These findings are consistent with rheumatoid arthritis.   Speciality Comments: No specialty comments available. 12/20/2016 BMP normal, hepatic function AST 47 ALT 42, lipid panel LDL 126, 07/09/2014 RF 257, ESR 22   Procedures:  No procedures performed Allergies: Codeine   Assessment / Plan:     Visit Diagnoses: Seropositive rheumatoid arthritis of multiple sites ([place]) +CCP +RF -Patient appears to have aggressive disease with some contractures and deformities in multiple joints. Her arthritis is not controlled. She is active synovitis on examination. She states she has frequent flares for which she takes prednisone when necessary basis. She was on Plaquenil which she ran out and took only for 3 months. She's been on methotrexate for several years. She's never tried any biologic agents. We had detailed discussion  regarding different treatment options and their side effects. As her LFTs were slightly elevated increasing methotrexate or switching to subcutaneous methotrexate will not be good choice. I think she will benefit by adding a biologic agent. I'll obtain following labs today.. Different treatment options and their side effects were discussed. After reviewing indications side effects contraindications and handout on Orencia was given. She wants to proceed with Orencia subcutaneous. Consent was taken. We will apply for the medication. Plan: Sedimentation rate  High risk medication use - Methotrexate 8 tabs po q week, Folic acid 33m po qd, Plaquenil 200 mg po bid-dcdc6 months ago-ran out. (PRN prednisone)  previously managed by Dr TCharlestine Night - Plan: CBC with Differential/Platelet, COMPLETE  METABOLIC PANEL WITH GFR, Urinalysis, Routine w reflex microscopic, Quantiferon tb gold assay (blood), IgG, IgA, IgM, Serum protein electrophoresis with reflex, Hepatitis B core antibody, IgM, Hepatitis B surface antigen, Hepatitis C antibody, HIV antibody  Pain in both hands - she has synovitis over her wrist joints and her MCP joints with subluxation of all of her MCP joints. Plan: XR Hand 2 View Right, XR Hand 2 View Left  Pain in both feet. She has swelling of her bilateral ankle joints subluxation of several of her MTP joints.- Plan: XR Foot 2 Views Right, XR Foot 2 Views Left  Other fatigue - Plan: CK, TSH, VITAMIN D 25 Hydroxy (Vit-D Deficiency, Fractures)   Psoriasis: Patient has no active lesions. She states she has psoriasis on her elbows and knee joints in the past which was never diagnosed by dermatologist.  Dry eyes: She has mild sicca symptoms  History of total knee replacement, bilateral: Doing well  DDD (degenerative disc disease), lumbar s/p lumbar fusion : Doing well  Osteopenia of multiple sites: Patient is is not aware of her diagnosis   History of adenomatous polyp of colon  History of asthma:  Mild per patient  History of hyperlipidemia  History of anxiety and depression: Does not require any treatment  Hypercalcemia  History of MRSA infection: No recurrence and 4 years     Orders: Orders Placed This Encounter  Procedures  . XR Hand 2 View Right  . XR Hand 2 View Left  . XR Foot 2 Views Right  . XR Foot 2 Views Left  . CBC with Differential/Platelet  . COMPLETE METABOLIC PANEL WITH GFR  . Urinalysis, Routine w reflex microscopic  . Sedimentation rate  . CK  . TSH  . Quantiferon tb gold assay (blood)  . IgG, IgA, IgM  . Serum protein electrophoresis with reflex  . Hepatitis B core antibody, IgM  . Hepatitis B surface antigen  . Hepatitis C antibody  . HIV antibody  . VITAMIN D 25 Hydroxy (Vit-D Deficiency, Fractures)   No orders of the defined types were placed in this encounter.   Face-to-face time spent with patient was 50 minutes. 50% of time was spent in counseling and coordination of care.  Follow-Up Instructions: Return for Rheumatoid arthritis.   [name], MD  Note - This record has been created using Editor, commissioning.  Chart creation errors have been sought, but may not always  have been located. Such creation errors do not reflect on  the standard of medical care.

## 2017-03-06 ENCOUNTER — Telehealth: Payer: Self-pay

## 2017-03-06 ENCOUNTER — Ambulatory Visit (INDEPENDENT_AMBULATORY_CARE_PROVIDER_SITE_OTHER): Payer: Self-pay

## 2017-03-06 ENCOUNTER — Ambulatory Visit (INDEPENDENT_AMBULATORY_CARE_PROVIDER_SITE_OTHER): Payer: BLUE CROSS/BLUE SHIELD | Admitting: Rheumatology

## 2017-03-06 ENCOUNTER — Encounter: Payer: Self-pay | Admitting: Rheumatology

## 2017-03-06 VITALS — BP 128/95 | HR 86 | Resp 17 | Ht 65.0 in | Wt 243.0 lb

## 2017-03-06 DIAGNOSIS — Z8659 Personal history of other mental and behavioral disorders: Secondary | ICD-10-CM

## 2017-03-06 DIAGNOSIS — Z8709 Personal history of other diseases of the respiratory system: Secondary | ICD-10-CM

## 2017-03-06 DIAGNOSIS — M79672 Pain in left foot: Secondary | ICD-10-CM | POA: Diagnosis not present

## 2017-03-06 DIAGNOSIS — Z79899 Other long term (current) drug therapy: Secondary | ICD-10-CM

## 2017-03-06 DIAGNOSIS — M2550 Pain in unspecified joint: Secondary | ICD-10-CM

## 2017-03-06 DIAGNOSIS — Z8614 Personal history of Methicillin resistant Staphylococcus aureus infection: Secondary | ICD-10-CM

## 2017-03-06 DIAGNOSIS — M79641 Pain in right hand: Secondary | ICD-10-CM | POA: Diagnosis not present

## 2017-03-06 DIAGNOSIS — M79671 Pain in right foot: Secondary | ICD-10-CM | POA: Diagnosis not present

## 2017-03-06 DIAGNOSIS — Z86010 Personal history of colonic polyps: Secondary | ICD-10-CM | POA: Diagnosis not present

## 2017-03-06 DIAGNOSIS — M79642 Pain in left hand: Secondary | ICD-10-CM | POA: Diagnosis not present

## 2017-03-06 DIAGNOSIS — L409 Psoriasis, unspecified: Secondary | ICD-10-CM

## 2017-03-06 DIAGNOSIS — M0579 Rheumatoid arthritis with rheumatoid factor of multiple sites without organ or systems involvement: Secondary | ICD-10-CM

## 2017-03-06 DIAGNOSIS — M5136 Other intervertebral disc degeneration, lumbar region: Secondary | ICD-10-CM

## 2017-03-06 DIAGNOSIS — M8589 Other specified disorders of bone density and structure, multiple sites: Secondary | ICD-10-CM

## 2017-03-06 DIAGNOSIS — R5383 Other fatigue: Secondary | ICD-10-CM

## 2017-03-06 DIAGNOSIS — Z8639 Personal history of other endocrine, nutritional and metabolic disease: Secondary | ICD-10-CM

## 2017-03-06 DIAGNOSIS — H04123 Dry eye syndrome of bilateral lacrimal glands: Secondary | ICD-10-CM

## 2017-03-06 DIAGNOSIS — Z96653 Presence of artificial knee joint, bilateral: Secondary | ICD-10-CM

## 2017-03-06 LAB — COMPLETE METABOLIC PANEL WITH GFR
ALBUMIN: 3.8 g/dL (ref 3.6–5.1)
ALK PHOS: 96 U/L (ref 33–130)
ALT: 43 U/L — AB (ref 6–29)
AST: 28 U/L (ref 10–35)
BILIRUBIN TOTAL: 1.2 mg/dL (ref 0.2–1.2)
BUN: 11 mg/dL (ref 7–25)
CO2: 20 mmol/L (ref 20–31)
CREATININE: 0.7 mg/dL (ref 0.50–0.99)
Calcium: 9.7 mg/dL (ref 8.6–10.4)
Chloride: 103 mmol/L (ref 98–110)
GFR, Est African American: >89 mL/min (ref >=60)
GFR, Est Non African American: >89 mL/min (ref >=60)
GLUCOSE: 89 mg/dL (ref 65–99)
Potassium: 3.8 mmol/L (ref 3.5–5.3)
SODIUM: 135 mmol/L (ref 135–146)
TOTAL PROTEIN: 6.3 g/dL (ref 6.1–8.1)

## 2017-03-06 LAB — CBC WITH DIFFERENTIAL/PLATELET
BASOS ABS: 0 {cells}/uL (ref 0–200)
BASOS PCT: 0 %
EOS PCT: 1 %
Eosinophils Absolute: 84 cells/uL (ref 15–500)
HCT: 37.1 % (ref 35.0–45.0)
HEMOGLOBIN: 11.7 g/dL (ref 11.7–15.5)
LYMPHS ABS: 1176 {cells}/uL (ref 850–3900)
Lymphocytes Relative: 14 %
MCH: 27 pg (ref 27.0–33.0)
MCHC: 31.5 g/dL — ABNORMAL LOW (ref 32.0–36.0)
MCV: 85.5 fL (ref 80.0–100.0)
MONOS PCT: 11 %
MPV: 8.6 fL (ref 7.5–12.5)
Monocytes Absolute: 924 cells/uL (ref 200–950)
NEUTROS ABS: 6216 {cells}/uL (ref 1500–7800)
Neutrophils Relative %: 74 %
PLATELETS: 243 10*3/uL (ref 140–400)
RBC: 4.34 MIL/uL (ref 3.80–5.10)
RDW: 20.6 % — ABNORMAL HIGH (ref 11.0–15.0)
WBC: 8.4 10*3/uL (ref 3.8–10.8)

## 2017-03-06 MED ORDER — FOLIC ACID 1 MG PO TABS: 2.0000 mg | ORAL_TABLET | Freq: Every day | ORAL | 3 refills | Status: DC

## 2017-03-06 NOTE — Patient Instructions (Signed)
We would recommend getting a Shingrix vaccine.  Please discuss this with your primary care provider.    Methotrexate tablets What is this medicine? METHOTREXATE (METH oh TREX ate) is a chemotherapy drug used to treat cancer including breast cancer, leukemia, and lymphoma. This medicine can also be used to treat psoriasis and certain kinds of arthritis. This medicine may be used for other purposes; ask your health care provider or pharmacist if you have questions. COMMON BRAND NAME(S): Rheumatrex, Trexall What should I tell my health care provider before I take this medicine? They need to know if you have any of these conditions: -fluid in the stomach area or lungs -if you often drink alcohol -infection or immune system problems -kidney disease or on hemodialysis -liver disease -low blood counts, like low white cell, platelet, or red cell counts -lung disease -radiation therapy -stomach ulcers -ulcerative colitis -an unusual or allergic reaction to methotrexate, other medicines, foods, dyes, or preservatives -pregnant or trying to get pregnant -breast-feeding How should I use this medicine? Take this medicine by mouth with a glass of water. Follow the directions on the prescription label. Take your medicine at regular intervals. Do not take it more often than directed. Do not stop taking except on your doctor's advice. Make sure you know why you are taking this medicine and how often you should take it. If this medicine is used for a condition that is not cancer, like arthritis or psoriasis, it should be taken weekly, NOT daily. Taking this medicine more often than directed can cause serious side effects, even death. Talk to your healthcare provider about safe handling and disposal of this medicine. You may need to take special precautions. Talk to your pediatrician regarding the use of this medicine in children. While this drug may be prescribed for selected conditions, precautions do  apply. Overdosage: If you think you have taken too much of this medicine contact a poison control center or emergency room at once. NOTE: This medicine is only for you. Do not share this medicine with others. What if I miss a dose? If you miss a dose, talk with your doctor or health care professional. Do not take double or extra doses. What may interact with this medicine? This medicine may interact with the following medication: -acitretin -aspirin and aspirin-like medicines including salicylates -azathioprine -certain antibiotics like penicillins, tetracycline, and chloramphenicol -cyclosporine -gold -hydroxychloroquine -live virus vaccines -NSAIDs, medicines for pain and inflammation, like ibuprofen or naproxen -other cytotoxic agents -penicillamine -phenylbutazone -phenytoin -probenecid -retinoids such as isotretinoin and tretinoin -steroid medicines like prednisone or cortisone -sulfonamides like sulfasalazine and trimethoprim/sulfamethoxazole -theophylline This list may not describe all possible interactions. Give your health care provider a list of all the medicines, herbs, non-prescription drugs, or dietary supplements you use. Also tell them if you smoke, drink alcohol, or use illegal drugs. Some items may interact with your medicine. What should I watch for while using this medicine? Avoid alcoholic drinks. This medicine can make you more sensitive to the sun. Keep out of the sun. If you cannot avoid being in the sun, wear protective clothing and use sunscreen. Do not use sun lamps or tanning beds/booths. You may need blood work done while you are taking this medicine. Call your doctor or health care professional for advice if you get a fever, chills or sore throat, or other symptoms of a cold or flu. Do not treat yourself. This drug decreases your body's ability to fight infections. Try to avoid being around people who  are sick. This medicine may increase your risk to bruise  or bleed. Call your doctor or health care professional if you notice any unusual bleeding. Check with your doctor or health care professional if you get an attack of severe diarrhea, nausea and vomiting, or if you sweat a lot. The loss of too much body fluid can make it dangerous for you to take this medicine. Talk to your doctor about your risk of cancer. You may be more at risk for certain types of cancers if you take this medicine. Both men and women must use effective birth control with this medicine. Do not become pregnant while taking this medicine or until at least 1 normal menstrual cycle has occurred after stopping it. Women should inform their doctor if they wish to become pregnant or think they might be pregnant. Men should not father a child while taking this medicine and for 3 months after stopping it. There is a potential for serious side effects to an unborn child. Talk to your health care professional or pharmacist for more information. Do not breast-feed an infant while taking this medicine. What side effects may I notice from receiving this medicine? Side effects that you should report to your doctor or health care professional as soon as possible: -allergic reactions like skin rash, itching or hives, swelling of the face, lips, or tongue -breathing problems or shortness of breath -diarrhea -dry, nonproductive cough -low blood counts - this medicine may decrease the number of white blood cells, red blood cells and platelets. You may be at increased risk for infections and bleeding. -mouth sores -redness, blistering, peeling or loosening of the skin, including inside the mouth -signs of infection - fever or chills, cough, sore throat, pain or trouble passing urine -signs and symptoms of bleeding such as bloody or black, tarry stools; red or dark-brown urine; spitting up blood or brown material that looks like coffee grounds; red spots on the skin; unusual bruising or bleeding from the  eye, gums, or nose -signs and symptoms of kidney injury like trouble passing urine or change in the amount of urine -signs and symptoms of liver injury like dark yellow or brown urine; general ill feeling or flu-like symptoms; light-colored stools; loss of appetite; nausea; right upper belly pain; unusually weak or tired; yellowing of the eyes or skin Side effects that usually do not require medical attention (report to your doctor or health care professional if they continue or are bothersome): -dizziness -hair loss -tiredness -upset stomach -vomiting This list may not describe all possible side effects. Call your doctor for medical advice about side effects. You may report side effects to FDA at 1-800-FDA-1088. Where should I keep my medicine? Keep out of the reach of children. Store at room temperature between 20 and 25 degrees C (68 and 77 degrees F). Protect from light. Throw away any unused medicine after the expiration date. NOTE: This sheet is a summary. It may not cover all possible information. If you have questions about this medicine, talk to your doctor, pharmacist, or health care provider.  2018 Elsevier/Gold Standard (2015-04-12 05:39:22)  Abatacept solution for injection (subcutaneous or intravenous use) What is this medicine? ABATACEPT (a ba TA sept) is used to treat moderate to severe active rheumatoid arthritis or psoriatic arthritis in adults. This medicine is also used to treat juvenile idiopathic arthritis. This medicine may be used for other purposes; ask your health care provider or pharmacist if you have questions. COMMON BRAND NAME(S): Orencia What should  I tell my health care provider before I take this medicine? They need to know if you have any of these conditions: -are taking other medicines to treat rheumatoid arthritis -COPD -diabetes -infection or history of infections -recently received or scheduled to receive a vaccine -scheduled to have  surgery -tuberculosis, a positive skin test for tuberculosis or have recently been in close contact with someone who has tuberculosis -viral hepatitis -an unusual or allergic reaction to abatacept, other medicines, foods, dyes, or preservatives -pregnant or trying to get pregnant -breast-feeding How should I use this medicine? This medicine is for infusion into a vein or for injection under the skin. Infusions are given by a health care professional in a hospital or clinic setting. If you are to give your own medicine at home, you will be taught how to prepare and give this medicine under the skin. Use exactly as directed. Take your medicine at regular intervals. Do not take your medicine more often than directed. It is important that you put your used needles and syringes in a special sharps container. Do not put them in a trash can. If you do not have a sharps container, call your pharmacist or healthcare provider to get one. Talk to your pediatrician regarding the use of this medicine in children. While infusions in a clinic may be prescribed for children as young as 2 years for selected conditions, precautions do apply. Overdosage: If you think you have taken too much of this medicine contact a poison control center or emergency room at once. NOTE: This medicine is only for you. Do not share this medicine with others. What if I miss a dose? This medicine is used once a week if given by injection under the skin. If you miss a dose, take it as soon as you can. If it is almost time for your next dose, take only that dose. Do not take double or extra doses. If you are to be given an infusion, it is important not to miss your dose. Doses are usually every 4 weeks. Call your doctor or health care professional if you are unable to keep an appointment. What may interact with this medicine? Do not take this medicine with any of the following  medications: -adalimumab -anakinra -certolizumab -etanercept -golimumab -infliximab -live virus vaccines -rituximab -tocilizumab This medicine may also interact with the following medications: -vaccines This list may not describe all possible interactions. Give your health care provider a list of all the medicines, herbs, non-prescription drugs, or dietary supplements you use. Also tell them if you smoke, drink alcohol, or use illegal drugs. Some items may interact with your medicine. What should I watch for while using this medicine? Visit your doctor for regular check ups while you are taking this medicine. Tell your doctor or healthcare professional if your symptoms do not start to get better or if they get worse. Call your doctor or health care professional if you get a cold or other infection while receiving this medicine. Do not treat yourself. This medicine may decrease your body's ability to fight infection. Try to avoid being around people who are sick. What side effects may I notice from receiving this medicine? Side effects that you should report to your doctor or health care professional as soon as possible: -allergic reactions like skin rash, itching or hives, swelling of the face, lips, or tongue -breathing problems -chest pain -signs of infection - fever or chills, cough, unusual tiredness, pain or trouble passing urine, or warm, red  or painful skin Side effects that usually do not require medical attention (report to your doctor or health care professional if they continue or are bothersome): -dizziness -headache -nausea, vomiting -sore throat -stomach upset This list may not describe all possible side effects. Call your doctor for medical advice about side effects. You may report side effects to FDA at 1-800-FDA-1088. Where should I keep my medicine? Infusions will be given in a hospital or clinic and will not be stored at home. Storage for syringes given under the  skin and stored at home: Keep out of the reach of children. Store in a refrigerator between 2 and 8 degrees C (36 and 46 degrees F). Keep this medicine in the original container. Protect from light. Do not freeze. Throw away any unused medicine after the expiration date. NOTE: This sheet is a summary. It may not cover all possible information. If you have questions about this medicine, talk to your doctor, pharmacist, or health care provider.  2018 Elsevier/Gold Standard (2016-02-24 10:07:35)

## 2017-03-06 NOTE — Telephone Encounter (Signed)
Submitted a prior authorization for Orencia to patient's insurance via cover my meds. Will update once we receive a response.   Rajinder Mesick, [place], CPhT 11:43 AM

## 2017-03-06 NOTE — Progress Notes (Signed)
Pharmacy Note  Subjective: Patient presents today to the [facility] to see Dr. Estanislado Pandy.  Patient is currently taking methotrexate 20 mg by mouth weekly and folic acid 1 mg by mouth on the day of her methotrexate.  Decision was made to try to apply for Orencia.  Patient seen by the pharmacist for counseling on methotrexate and Orencia.    Objective: CBC, CMP: ordered today TB Gold: ordered today Hepatitis panel: ordered today HIV: ordered today  Chest-xray:  11/10/2009 "IMPRESSION: No active disease."   Contraception: post-menopausal   Alcohol use: rare  Patient confirms she gets annual influenza vaccines and has had a pneumonia vaccine.  She confirms she also had the Zostavax.    Assessment/Plan:  Patient was counseled on the purpose, proper use, and adverse effects of methotrexate including nausea, infection, and signs and symptoms of pneumonitis.  Reviewed instructions with patient to take methotrexate weekly along with folic acid daily.  Advised patient to increase folic acid dose to 2 mg daily.  Discussed the importance of frequent monitoring of kidney and liver function and blood counts.  Counseled patient to avoid NSAIDs and alcohol while on methotrexate.  Provided patient with educational materials on methotrexate and answered all questions.  Patient consented to methotrexate use.  Will upload into chart.    Counseled patient that Maureen Chatters is a selective T-cell costimulation blocker indicated for rheumatoid arthritis.  Counseled patient on purpose, proper use, and adverse effects of Orencia. The most common adverse effects are increased risk of infections, headache, and infusion reactions.  There is the possibility of an increased risk of malignancy but it is not well understood if this increased risk is due to the medication or the disease state.  Provided patient with medication education material and answered all questions.  Patient consented to [facility]  Will upload  consent into patient's chart.  Will apply for Orencia [employer] through patient's insurance.  Provided patient with Orencia copay card.  Reviewed storage information for Orencia.  Advised initial injection must be administered in office.    [name], Pharm.D., BCPS Clinical Pharmacist Pager: [phone] Phone: [phone] 03/06/2017 9:36 AM

## 2017-03-07 LAB — IGG, IGA, IGM
IgA: 305 mg/dL (ref 81–463)
IgG (Immunoglobin G), Serum: 876 mg/dL (ref 694–1618)
IgM, Serum: 73 mg/dL (ref 48–271)

## 2017-03-07 LAB — HIV ANTIBODY (ROUTINE TESTING W REFLEX): HIV: NONREACTIVE

## 2017-03-07 LAB — CK: Total CK: 68 U/L (ref 29–143)

## 2017-03-07 LAB — SEDIMENTATION RATE: Sed Rate: 20 mm/hr (ref 0–30)

## 2017-03-07 LAB — URINALYSIS, ROUTINE W REFLEX MICROSCOPIC

## 2017-03-07 LAB — TSH: TSH: 0.67 m[IU]/L

## 2017-03-07 LAB — HEPATITIS B CORE ANTIBODY, IGM: Hep B C IgM: NONREACTIVE

## 2017-03-07 LAB — HEPATITIS C ANTIBODY: HCV AB: NEGATIVE

## 2017-03-07 LAB — HEPATITIS B SURFACE ANTIGEN: Hepatitis B Surface Ag: NEGATIVE

## 2017-03-07 LAB — VITAMIN D 25 HYDROXY (VIT D DEFICIENCY, FRACTURES): Vit D, 25-Hydroxy: 35 ng/mL (ref 30–100)

## 2017-03-07 NOTE — Telephone Encounter (Signed)
Received a fax from [place] requesting additional information to process patient's application. (Chart notes detailing the outcomes of treatment with MTX in combination with at least once other non-biologic DMARD).  Faxed chart notes from 03/06/17 was submitted to [place] (fax: [phone])  Will update once we receive a response.   Jazier Mcglamery, [place], CPhT 11:26 AM

## 2017-03-08 ENCOUNTER — Ambulatory Visit (INDEPENDENT_AMBULATORY_CARE_PROVIDER_SITE_OTHER): Payer: BLUE CROSS/BLUE SHIELD | Admitting: Family Medicine

## 2017-03-08 ENCOUNTER — Encounter: Payer: Self-pay | Admitting: Family Medicine

## 2017-03-08 VITALS — BP 128/80 | Temp 99.7°F | Ht 65.0 in | Wt 241.4 lb

## 2017-03-08 DIAGNOSIS — J329 Chronic sinusitis, unspecified: Secondary | ICD-10-CM

## 2017-03-08 DIAGNOSIS — J310 Chronic rhinitis: Secondary | ICD-10-CM

## 2017-03-08 LAB — URINALYSIS, ROUTINE W REFLEX MICROSCOPIC
Bilirubin Urine: NEGATIVE
Glucose, UA: NEGATIVE
HGB URINE DIPSTICK: NEGATIVE
KETONES UR: NEGATIVE
LEUKOCYTES UA: NEGATIVE
NITRITE: NEGATIVE
PH: 5.5 (ref 5.0–8.0)
PROTEIN: NEGATIVE
Specific Gravity, Urine: 1.01 (ref 1.001–1.035)

## 2017-03-08 LAB — QUANTIFERON TB GOLD ASSAY (BLOOD)
Interferon Gamma Release Assay: NEGATIVE
MITOGEN-NIL SO: 4.1 [IU]/mL
QUANTIFERON TB AG MINUS NIL: 0.03 [IU]/mL
Quantiferon Nil Value: 0.07 IU/mL

## 2017-03-08 NOTE — Progress Notes (Signed)
   Subjective:    Patient ID: [name], female    DOB: 1951/03/02, 66 y.o.   MRN: [id number]  Sinusitis  This is a new problem. The current episode started 1 to 4 weeks ago. Associated symptoms include coughing, headaches and a sore throat. Treatments tried: Steam, [employer].   Woke up stuffy and congested  Some potential exposures  Frontal heaach  worsebs with incr postiiton   Morn worse itn he am with the sore thoat  Lung overall are clear no wheeing   Used tylenol prn     Patient states no other concerns this visit.    Review of Systems  HENT: Positive for sore throat.   Respiratory: Positive for cough.   Neurological: Positive for headaches.       Objective:   Physical Exam Alert, mild malaise. Hydration good Vitals stable. frontal/ maxillary tenderness evident positive nasal congestion. pharynx normal neck supple  lungs clear/no crackles or wheezes. heart regular in rhythm        Assessment & Plan:  Impression rhinosinusitis likely post viral, discussed with patient. plan antibiotics prescribed. Questions answered. Symptomatic care discussed. warning signs discussed. WSL

## 2017-03-12 ENCOUNTER — Telehealth: Payer: Self-pay | Admitting: Rheumatology

## 2017-03-12 LAB — PROTEIN ELECTROPHORESIS, SERUM, WITH REFLEX
ALBUMIN ELP: 3.7 g/dL — AB (ref 3.8–4.8)
ALPHA-1-GLOBULIN: 0.3 g/dL (ref 0.2–0.3)
ALPHA-2-GLOBULIN: 0.7 g/dL (ref 0.5–0.9)
BETA 2: 0.4 g/dL (ref 0.2–0.5)
BETA GLOBULIN: 0.5 g/dL (ref 0.4–0.6)
Gamma Globulin: 0.8 g/dL (ref 0.8–1.7)
TOTAL PROTEIN, SERUM ELECTROPHOR: 6.4 g/dL (ref 6.1–8.1)

## 2017-03-12 LAB — IFE INTERPRETATION: IMMUNOFIX ELECTR INT: NOT DETECTED

## 2017-03-12 NOTE — Telephone Encounter (Signed)
I called patient to discuss Orencia approval.  Noted we are still waiting on labs to result.  I left a message asking patient to call me back.   [name], Pharm.D., BCPS, CPP Clinical Pharmacist Pager: [phone] Phone: [phone] 03/12/2017 12:58 PM

## 2017-03-12 NOTE — Telephone Encounter (Signed)
Received another fax from [place] stating that PA requires documentation (example: chart notes or patient medical record) detailing the outcome of treatment with MTX in combination with at least one other non-biologic DMARD. Chart notes from 03/06/17 were faxed previously. Called to see if they received it.   Spoke with Lanelle Bal, a clinical pharmacist, who states that they did received the chart notes. They need to have documentation of patient trying MTX and Plaquenil along with the results. Medication was prescribed by previous doctor. Lanelle Bal will review the notes again and contact us if more information is required.   Darrnell Mangiaracina, [place], CPhT 8:51 AM

## 2017-03-12 NOTE — Telephone Encounter (Signed)
See telephone encounter from 03/06/17.

## 2017-03-12 NOTE — Telephone Encounter (Signed)
Received a confirmation from cover my meds regarding a prior authorization approval for Orencia SQ from 03/12/17 to 03/13/19.   Reference number:[id number]  Will send document to scan center.  Davinia Riccardi, [place], CPhT  12:36 PM

## 2017-03-12 NOTE — Telephone Encounter (Signed)
I informed patient of Orencia approval.  Advised we can proceed with Orencia once we know results of labs.  She did have questions regarding if her insurance changes/she switches to medicare.  Reviewed options for copay assistance for medicare patients including grant foundations or manufacture patient assistance programs or discussed medicare supplement plan for infusions.  Patient voiced understanding.    [name], Pharm.D., BCPS, CPP Clinical Pharmacist Pager: [phone] Phone: [phone] 03/12/2017 3:46 PM

## 2017-03-12 NOTE — Telephone Encounter (Signed)
Patient returned Dr. Hans Eden phone call.

## 2017-03-16 ENCOUNTER — Telehealth: Payer: Self-pay | Admitting: Pharmacist

## 2017-03-16 MED ORDER — ABATACEPT 125 MG/ML ~~LOC~~ SOAJ: 125.0000 mg | SUBCUTANEOUS | 2 refills | Status: DC

## 2017-03-16 MED ORDER — METHOTREXATE 2.5 MG PO TABS: 20.0000 mg | ORAL_TABLET | ORAL | 0 refills | Status: DC

## 2017-03-16 NOTE — Telephone Encounter (Signed)
-----   Message from [name], MD sent at 03/16/2017  1:30 PM EDT ----- Ok to refill MTX and start [place].

## 2017-03-16 NOTE — Progress Notes (Signed)
Ok to refill MTX and start Orencia.

## 2017-03-16 NOTE — Telephone Encounter (Signed)
I reviewed labs with patient.  Advised her of plan to refill methotrexate ([facility]) and to proceed with Orencia.  Advised we can start with Orencia sample.  Nurse visit scheduled on Wednesday, 03/21/17 at 10:00 AM.  [facility]  Reviewed storage instructions of Orencia and advised patient not to start medication before nurse visit.   Patient reports she is currently on antibiotics for sinus infection.  She will complete antibiotics in 3 days.  Advised that she would need to postpone initiation of Orencia if infection has not resolved or if she is still on antibiotics.  Patient voiced understanding.    [name], Pharm.D., BCPS, CPP Clinical Pharmacist Pager: [phone] Phone: [phone] 03/16/2017 2:32 PM

## 2017-03-20 ENCOUNTER — Telehealth: Payer: Self-pay

## 2017-03-20 NOTE — Telephone Encounter (Signed)
Attempted to contact [place]. Left message for him to contact the office.

## 2017-03-20 NOTE — Telephone Encounter (Signed)
Corene Cornea with [facility] would like a call back concerning patient Orencia.  Cb# is [phone].  Please advise.  Thank You.

## 2017-03-21 ENCOUNTER — Ambulatory Visit (INDEPENDENT_AMBULATORY_CARE_PROVIDER_SITE_OTHER): Payer: BLUE CROSS/BLUE SHIELD | Admitting: *Deleted

## 2017-03-21 VITALS — BP 141/68 | HR 76

## 2017-03-21 DIAGNOSIS — M0579 Rheumatoid arthritis with rheumatoid factor of multiple sites without organ or systems involvement: Secondary | ICD-10-CM | POA: Diagnosis not present

## 2017-03-21 DIAGNOSIS — Z79899 Other long term (current) drug therapy: Secondary | ICD-10-CM | POA: Diagnosis not present

## 2017-03-21 MED ORDER — ABATACEPT 125 MG/ML ~~LOC~~ SOAJ
125.0000 mg | Freq: Once | SUBCUTANEOUS | Status: AC
  Administered 2017-03-21: 125 mg via SUBCUTANEOUS

## 2017-03-21 NOTE — Progress Notes (Signed)
Patient in office for a new start to [place]. Patient was provided with a sample. Patient gave injection in left lower abdomen. Patient tolerated injection well. Patient was monitored in office for 30 minutes after administration for adverse reactions. No injection site reactions noted. Patient did not she had a metallic taste in her mouth. Reported to Dr. Koleen Nimrod and Dr. Estanislado Pandy.   Administrations This Visit    Abatacept SOAJ 125 mg    Admin Date 03/21/2017 Action Given Dose 125 mg Route Subcutaneous Administered By Carole Binning, LPN

## 2017-03-21 NOTE — Patient Instructions (Signed)
Standing Labs We placed an order today for your standing lab work.    Please come back and get your standing labs in 1 month then every 3 months  We have open lab Monday through Friday from 8:30-11:30 AM and 1:30-4 PM at the office of Dr. [name].   The office is located at [address], [place], [place], [place] [address] No appointment is necessary.   Labs are drawn by [facility].  You may receive a bill from [facility] for your lab work. If you have any questions regarding directions or hours of operation,  please call [phone].     Abatacept solution for injection (subcutaneous or intravenous use) What is this medicine? ABATACEPT (a ba TA sept) is used to treat moderate to severe active rheumatoid arthritis or psoriatic arthritis in adults. This medicine is also used to treat juvenile idiopathic arthritis. This medicine may be used for other purposes; ask your health care provider or pharmacist if you have questions. COMMON BRAND NAME(S): Orencia What should I tell my health care provider before I take this medicine? They need to know if you have any of these conditions: -are taking other medicines to treat rheumatoid arthritis -COPD -diabetes -infection or history of infections -recently received or scheduled to receive a vaccine -scheduled to have surgery -tuberculosis, a positive skin test for tuberculosis or have recently been in close contact with someone who has tuberculosis -viral hepatitis -an unusual or allergic reaction to abatacept, other medicines, foods, dyes, or preservatives -pregnant or trying to get pregnant -breast-feeding How should I use this medicine? This medicine is for infusion into a vein or for injection under the skin. Infusions are given by a health care professional in a hospital or clinic setting. If you are to give your own medicine at home, you will be taught how to prepare and give this medicine under the skin. Use exactly as directed.  Take your medicine at regular intervals. Do not take your medicine more often than directed. It is important that you put your used needles and syringes in a special sharps container. Do not put them in a trash can. If you do not have a sharps container, call your pharmacist or healthcare provider to get one. Talk to your pediatrician regarding the use of this medicine in children. While infusions in a clinic may be prescribed for children as young as 2 years for selected conditions, precautions do apply. Overdosage: If you think you have taken too much of this medicine contact a poison control center or emergency room at once. NOTE: This medicine is only for you. Do not share this medicine with others. What if I miss a dose? This medicine is used once a week if given by injection under the skin. If you miss a dose, take it as soon as you can. If it is almost time for your next dose, take only that dose. Do not take double or extra doses. If you are to be given an infusion, it is important not to miss your dose. Doses are usually every 4 weeks. Call your doctor or health care professional if you are unable to keep an appointment. What may interact with this medicine? Do not take this medicine with any of the following medications: -adalimumab -anakinra -certolizumab -etanercept -golimumab -infliximab -live virus vaccines -rituximab -tocilizumab This medicine may also interact with the following medications: -vaccines This list may not describe all possible interactions. Give your health care provider a list of all the medicines, herbs,  non-prescription drugs, or dietary supplements you use. Also tell them if you smoke, drink alcohol, or use illegal drugs. Some items may interact with your medicine. What should I watch for while using this medicine? Visit your doctor for regular check ups while you are taking this medicine. Tell your doctor or healthcare professional if your symptoms do not  start to get better or if they get worse. Call your doctor or health care professional if you get a cold or other infection while receiving this medicine. Do not treat yourself. This medicine may decrease your body's ability to fight infection. Try to avoid being around people who are sick. What side effects may I notice from receiving this medicine? Side effects that you should report to your doctor or health care professional as soon as possible: -allergic reactions like skin rash, itching or hives, swelling of the face, lips, or tongue -breathing problems -chest pain -signs of infection - fever or chills, cough, unusual tiredness, pain or trouble passing urine, or warm, red or painful skin Side effects that usually do not require medical attention (report to your doctor or health care professional if they continue or are bothersome): -dizziness -headache -nausea, vomiting -sore throat -stomach upset This list may not describe all possible side effects. Call your doctor for medical advice about side effects. You may report side effects to FDA at 1-800-FDA-1088. Where should I keep my medicine? Infusions will be given in a hospital or clinic and will not be stored at home. Storage for syringes given under the skin and stored at home: Keep out of the reach of children. Store in a refrigerator between 2 and 8 degrees C (36 and 46 degrees F). Keep this medicine in the original container. Protect from light. Do not freeze. Throw away any unused medicine after the expiration date. NOTE: This sheet is a summary. It may not cover all possible information. If you have questions about this medicine, talk to your doctor, pharmacist, or health care provider.  2018 Elsevier/Gold Standard (2016-02-24 10:07:35)

## 2017-03-21 NOTE — Progress Notes (Signed)
Pharmacy Note  Subjective:   Patient is being initiated on Orencia.  Patient was previously counseled extensively on Orencia on 03/06/17 and consented to initiation of Orencia at that time.  Patient presents to clinic today to receive the first dose of Orencia.  Patient denies any active infection.    Objective: CMP     Component Value Date/Time   NA 135 03/06/2017 0923   NA 138 12/20/2016 0857   K 3.8 03/06/2017 0923   CL 103 03/06/2017 0923   CO2 20 03/06/2017 0923   GLUCOSE 89 03/06/2017 0923   BUN 11 03/06/2017 0923   BUN 16 12/20/2016 0857   CREATININE 0.70 03/06/2017 0923   CALCIUM 9.7 03/06/2017 0923   PROT 6.3 03/06/2017 0923   PROT 6.1 12/20/2016 0857   ALBUMIN 3.8 03/06/2017 0923   ALBUMIN 4.0 12/20/2016 0857   AST 28 03/06/2017 0923   ALT 43 (H) 03/06/2017 0923   ALKPHOS 96 03/06/2017 0923   BILITOT 1.2 03/06/2017 0923   BILITOT 0.4 12/20/2016 0857   GFRNONAA >89 03/06/2017 0923   GFRAA >89 03/06/2017 0923   CBC    Component Value Date/Time   WBC 8.4 03/06/2017 0923   RBC 4.34 03/06/2017 0923   HGB 11.7 03/06/2017 0923   HCT 37.1 03/06/2017 0923   PLT 243 03/06/2017 0923   MCV 85.5 03/06/2017 0923   MCH 27.0 03/06/2017 0923   MCHC 31.5 (L) 03/06/2017 0923   RDW 20.6 (H) 03/06/2017 0923   LYMPHSABS 1,176 03/06/2017 0923   MONOABS 924 03/06/2017 0923   EOSABS 84 03/06/2017 0923   BASOSABS 0 03/06/2017 0923    TB Gold: negative (03/06/17)  Assessment/Plan:  Patient was counseled on how to administer subcutaneous Orencia injection using a demonstration pen.  Patient received her first dose of Orencia.  The medication was administered in the left side of the abdomen.  Lot: [id number], Exp. 02/2018.  Patient was monitored for 30 minutes post injection.  No injection site reaction noted.  Patient will need standing lab orders in one month.  Provided patient with standing lab instructions and placed standing lab order.  Patient prefers to get labs done in [facility]  at [place].  Patient was given lab orders to take with her.    Patient confirms she talked to [facility] this morning regarding her Orencia prescription.  Medication is scheduled to be delivered on Wednesday, 03/28/17.  Patient has follow up visit scheduled 04/06/17.    [name], Pharm.D., BCPS Clinical Pharmacist Pager: [phone] Phone: [phone] 03/21/2017 10:03 AM

## 2017-03-21 NOTE — Telephone Encounter (Signed)
I attempted to call Corene Cornea regarding patient's medication, but there was no answer.  I called [facility] and spoke to [facility], pharmacist, who reports they needed ICD-10 code.  Provided her with patient's ICD code of M05.79.  I spoke to [employer] who reports the test claim was successful.  Patient will get a call to schedule delivery of her medication.   [name], Pharm.D., BCPS, CPP Clinical Pharmacist Pager: [phone] Phone: [phone] 03/21/2017 8:46 AM

## 2017-03-29 NOTE — Progress Notes (Signed)
Office Visit Note  Patient: [name]             Date of Birth: 1950-12-20           MRN: [id number]             PCP: [name], MD Referring: [name], MD Visit Date: 04/06/2017 Occupation: @GUAROCC @    Subjective:  Pain in joints.   History of Present Illness: [name] is a 66 y.o. female with history of sero positive rheumatoid arthritis. She states today she's been having some discomfort in her hands and feet. She started Orencia on August 1. She has noticed some improvement in joint pain in her hands. She's been having some discomfort in her right hip which she describes over the right trochanteric area and her lower back. She has not had any active psoriasis and a long time.  Activities of Daily Living:  Patient reports morning stiffness for 3-4 hours.   Patient Denies nocturnal pain.  Difficulty dressing/grooming: Denies Difficulty climbing stairs: Reports Difficulty getting out of chair: Denies Difficulty using hands for taps, buttons, cutlery, and/or writing: Denies   Review of Systems  Constitutional: Positive for fatigue. Negative for night sweats, weight gain, weight loss and weakness.  HENT: Positive for mouth dryness. Negative for mouth sores, trouble swallowing, trouble swallowing and nose dryness.   Eyes: Positive for dryness. Negative for pain, redness and visual disturbance.  Respiratory: Negative for cough, shortness of breath and difficulty breathing.   Cardiovascular: Negative for chest pain, palpitations, hypertension, irregular heartbeat and swelling in legs/feet.  Gastrointestinal: Negative for blood in stool, constipation and diarrhea.  Endocrine: Negative for increased urination.  Genitourinary: Negative for vaginal dryness.  Musculoskeletal: Positive for arthralgias, joint pain and morning stiffness. Negative for joint swelling, myalgias, muscle weakness, muscle tenderness and myalgias.  Skin: Negative for color change,  rash, hair loss, skin tightness, ulcers and sensitivity to sunlight.  Allergic/Immunologic: Negative for susceptible to infections.  Neurological: Negative for dizziness, memory loss and night sweats.  Hematological: Negative for swollen glands.  Psychiatric/Behavioral: Negative for depressed mood and sleep disturbance. The patient is not nervous/anxious.     PMFS History:  Patient Active Problem List   Diagnosis Date Noted  . History of MRSA infection 04/04/2017  . High risk medication use 03/02/2017  . Hypercalcemia 03/02/2017  . Pelvic pain in female 08/25/2015  . Hematuria 08/25/2015  . History of adenomatous polyp of colon 10/01/2014  . Seropositive rheumatoid arthritis of multiple sites ([place]) 07/20/2014  . History of juvenile rheumatoid arthritis 07/20/2014  . Impaired fasting glucose 07/20/2014  . NEOPLASM, SKIN, UNCERTAIN BEHAVIOR 66/01/3015  . Unilateral primary osteoarthritis, right knee 09/23/2008  . CALF PAIN, RIGHT 09/23/2008  . ANXIETY DEPRESSION 08/07/2008  . OBESITY 12/19/2007  . DDD (degenerative disc disease), lumbar 06/27/2007  . Osteopenia 06/27/2007  . HIP PAIN, LEFT 06/18/2007  . CONSTIPATION 05/16/2007  . Essential hypertension 04/04/2007  . Psoriasis 03/07/2007  . OSTEOARTHROSIS, GENERALIZED, MULTIPLE SITES 03/07/2007  . ANOSMIA 03/07/2007  . LIPOMA 02/21/2007  . Hyperlipidemia LDL goal <130 02/21/2007  . ALLERGIC RHINITIS 02/21/2007  . ASTHMA 02/21/2007  . ARTHRITIS 02/21/2007  . KNEE PAIN, LEFT 02/21/2007  . LOW BACK PAIN, CHRONIC 02/21/2007    Past Medical History:  Diagnosis Date  . Allergic rhinitis   . Arthritis   . Asthma   . Hematuria 08/25/2015  . Hyperlipidemia   . Hypertension   . Obesity   . Pelvic pain  in female 08/25/2015    Family History  Problem Relation Age of Onset  . Heart attack Father   . Leukemia Mother   . Obesity Sister   . Other Sister        breathing problems  . Hepatitis C Sister   . Arthritis Sister   .  Emphysema Brother   . Obesity Daughter   . Polycystic ovary syndrome Daughter   . Obesity Son   . Other Son        knee problems  . Cancer Maternal Grandmother        breast  . Other Brother        aneursym  . Arthritis Sister   . Obesity Son   . Asthma Son   . Obesity Son   . Diabetes Other        runs on dad's side of the family  . Colon cancer Neg Hx        states she doesn't know a lot about her family, but has never heard of specific colon ca   Past Surgical History:  Procedure Laterality Date  . BACK SURGERY    . CHOLECYSTECTOMY    . COLONOSCOPY  2007   sessile sigmoid polyp x 1; path: serrated adenoma  . COLONOSCOPY N/A 10/12/2014   Procedure: COLONOSCOPY;  Surgeon: [name], MD;  Location: [facility];  Service: Endoscopy;  Laterality: N/A;  . KNEE ARTHROPLASTY    . KNEE SURGERY     bilateral knee replacement  . LIPOMA EXCISION     x 2  . TONSILLECTOMY AND ADENOIDECTOMY     Social History   Social History Narrative  . No narrative on file     Objective: Vital Signs: BP 125/74 (BP Location: Left Arm, Patient Position: Sitting, Cuff Size: Normal)   Pulse 77   Resp 18   Ht 5\' 2"  (1.575 m)   Wt 243 lb (110.2 kg)   BMI 44.45 kg/m    Physical Exam  Constitutional: She is oriented to person, place, and time. She appears well-developed and well-nourished.  HENT:  Head: Normocephalic and atraumatic.  Eyes: Conjunctivae and EOM are normal.  Neck: Normal range of motion.  Cardiovascular: Normal rate, regular rhythm, normal heart sounds and intact distal pulses.   Pulmonary/Chest: Effort normal and breath sounds normal.  Abdominal: Soft. Bowel sounds are normal.  Lymphadenopathy:    She has no cervical adenopathy.  Neurological: She is alert and oriented to person, place, and time.  Skin: Skin is warm and dry. Capillary refill takes less than 2 seconds.  Psychiatric: She has a normal mood and affect. Her behavior is normal.  Nursing note and vitals  reviewed.    Musculoskeletal Exam: C-spine and thoracic spine good range of motion. She has limited range of motion of her lumbar spine with some discomfort in the lower back. Shoulder joints elbow joints wrist joints are good range of motion. She synovial thickening over her MCP joints but no synovitis was noted. Hip joints knee joints ankles were good range of motion. She is some discomfort range of motion of her right hip joint and tenderness over right trochanteric bursa consistent with trochanteric bursitis. She has overcrowding of the toes but no synovitis was noted.  CDAI Exam: CDAI Homunculus Exam:   Joint Counts:  CDAI Tender Joint count: 0 CDAI Swollen Joint count: 0  Global Assessments:  Patient Global Assessment: 2 Provider Global Assessment: 2  CDAI Calculated Score: 4  Investigation: Findings:  03/06/2017 Serum protein electrophoresis with reflex A poorly-defined band of restricted protein mobility is detected  in the gamma globulins. It is unlikely that this may represent a monoclonal protein/ IFE shows no monoclonal protein  CK normal, Hepatitis B core antibody, IgM non reactive, Hepatitis B surface antigen negative,  Hepatitis C antibody non reactive,HIV antibody non reactive,IgG, IgA, IgM normal, Quantiferon tb gold assay (blood) negative,Sedimentation rate normal, TSH normal, Urinalysis normal, and Vitamin D normal     CBC Latest Ref Rng & Units 03/06/2017 03/03/2010 03/02/2010  WBC 3.8 - 10.8 K/uL 8.4 5.6 7.2  Hemoglobin 11.7 - 15.5 g/dL 11.7 9.8(L) 8.7(L)  Hematocrit 35.0 - 45.0 % 37.1 28.4(L) 25.2(L)  Platelets 140 - 400 K/uL 243 206 193    CMP Latest Ref Rng & Units 03/06/2017 12/20/2016 07/05/2015  Glucose 65 - 99 mg/dL 89 90 64(L)  BUN 7 - 25 mg/dL 11 16 8   Creatinine 0.50 - 0.99 mg/dL 0.70 0.85 0.70  Sodium 135 - 146 mmol/L 135 138 142  Potassium 3.5 - 5.3 mmol/L 3.8 5.0 4.8  Chloride 98 - 110 mmol/L 103 101 104  CO2 20 - 31 mmol/L 20 24 25   Calcium 8.6  - 10.4 mg/dL 9.7 9.9 10.2  Total Protein 6.1 - 8.1 g/dL 6.3 6.1 6.0  Total Bilirubin 0.2 - 1.2 mg/dL 1.2 0.4 0.5  Alkaline Phos 33 - 130 U/L 96 100 71  AST 10 - 35 U/L 28 47(H) 16  ALT 6 - 29 U/L 43(H) 82(H) 20    Imaging: Xr Hip Unilat W Or W/o Pelvis 2-3 Views Right  Result Date: 04/06/2017 No hip joint narrowing was noted. No chondrocalcinosis was noted. SI joint appeared normal. Normal x-ray of the hip joint  Xr Lumbar Spine 2-3 Views  Result Date: 04/06/2017 Multilevel spondylosis was noted. Surgical fusion of L4-5 noted. There is significant narrowing between T T12 and L1. Impression multilevel spondylosis of the lumbar spine   Speciality Comments: No specialty comments available.    Procedures:  No procedures performed Allergies: Codeine   Assessment / Plan:     Visit Diagnoses: Seropositive rheumatoid arthritis of multiple sites ([place]) -  positive RF, positive anti-CCP Previous patient of Dr. Charlestine Night with multiple contractures.Clinically she is doing much better with no synovitis on examination. She does have synovial thickening in her hands.  High risk medication use - Methotrexate 8 tablets by mouth every week, folic acid 2 mg by mouth daily and Orencia Clickjet  weekly (16/38/4665). She has noticed improvement in her symptoms on Orencia.  Pain in right hip - Plan: XR HIP UNILAT W OR W/O PELVIS 2-3 VIEWS RIGHT. The x-ray was unremarkable.  Trochanteric bursitis of right hip: Detailed counseling regarding trochanteric bursitis was provided. A handout on IT band exercises was given.  Psoriasis: She has no active lesions.  Dry eyes: She's been using over-the-counter products.  History of bilateral knee replacement: Doing well.  Chronic midline low back pain without sciatica. She's been complaining of increased lower back pain recently - Plan: XR Lumbar Spine 2-3 Views. She has multilevel spondylosis the x-ray will reviewed with the patient.  DDD (degenerative disc  disease), lumbar s/p lumbar fusion   Osteopenia of multiple sites: She is on calcium and vitamin D  History of adenomatous polyp of colon  Hyperlipidemia LDL goal <130  ANXIETY DEPRESSION  Essential hypertension  History of MRSA infection -  no recurrence since 2014    Orders: Orders Placed This Encounter  Procedures  . XR HIP UNILAT W OR W/O PELVIS 2-3 VIEWS RIGHT  . XR Lumbar Spine 2-3 Views   No orders of the defined types were placed in this encounter.   Face-to-face time spent with patient was 30 minutes. Greater than 50% of time was spent in counseling and coordination of care.  Follow-Up Instructions: Return in about 4 months (around 08/06/2017) for Rheumatoid arthritis.   [name], MD  Note - This record has been created using Editor, commissioning.  Chart creation errors have been sought, but may not always  have been located. Such creation errors do not reflect on  the standard of medical care.

## 2017-04-04 DIAGNOSIS — Z8614 Personal history of Methicillin resistant Staphylococcus aureus infection: Secondary | ICD-10-CM | POA: Insufficient documentation

## 2017-04-06 ENCOUNTER — Ambulatory Visit (INDEPENDENT_AMBULATORY_CARE_PROVIDER_SITE_OTHER): Payer: BLUE CROSS/BLUE SHIELD | Admitting: Rheumatology

## 2017-04-06 ENCOUNTER — Ambulatory Visit (INDEPENDENT_AMBULATORY_CARE_PROVIDER_SITE_OTHER): Payer: Self-pay

## 2017-04-06 ENCOUNTER — Encounter: Payer: Self-pay | Admitting: Rheumatology

## 2017-04-06 VITALS — BP 125/74 | HR 77 | Resp 18 | Ht 62.0 in | Wt 243.0 lb

## 2017-04-06 DIAGNOSIS — Z79899 Other long term (current) drug therapy: Secondary | ICD-10-CM

## 2017-04-06 DIAGNOSIS — Z96653 Presence of artificial knee joint, bilateral: Secondary | ICD-10-CM

## 2017-04-06 DIAGNOSIS — M0579 Rheumatoid arthritis with rheumatoid factor of multiple sites without organ or systems involvement: Secondary | ICD-10-CM | POA: Diagnosis not present

## 2017-04-06 DIAGNOSIS — M8589 Other specified disorders of bone density and structure, multiple sites: Secondary | ICD-10-CM

## 2017-04-06 DIAGNOSIS — F341 Dysthymic disorder: Secondary | ICD-10-CM

## 2017-04-06 DIAGNOSIS — E785 Hyperlipidemia, unspecified: Secondary | ICD-10-CM | POA: Diagnosis not present

## 2017-04-06 DIAGNOSIS — M545 Low back pain, unspecified: Secondary | ICD-10-CM

## 2017-04-06 DIAGNOSIS — I10 Essential (primary) hypertension: Secondary | ICD-10-CM | POA: Diagnosis not present

## 2017-04-06 DIAGNOSIS — G8929 Other chronic pain: Secondary | ICD-10-CM

## 2017-04-06 DIAGNOSIS — M7061 Trochanteric bursitis, right hip: Secondary | ICD-10-CM

## 2017-04-06 DIAGNOSIS — L409 Psoriasis, unspecified: Secondary | ICD-10-CM

## 2017-04-06 DIAGNOSIS — M5136 Other intervertebral disc degeneration, lumbar region: Secondary | ICD-10-CM

## 2017-04-06 DIAGNOSIS — M25551 Pain in right hip: Secondary | ICD-10-CM | POA: Diagnosis not present

## 2017-04-06 DIAGNOSIS — H04123 Dry eye syndrome of bilateral lacrimal glands: Secondary | ICD-10-CM

## 2017-04-06 DIAGNOSIS — Z8614 Personal history of Methicillin resistant Staphylococcus aureus infection: Secondary | ICD-10-CM | POA: Diagnosis not present

## 2017-04-06 DIAGNOSIS — Z86010 Personal history of colonic polyps: Secondary | ICD-10-CM | POA: Diagnosis not present

## 2017-04-06 NOTE — Patient Instructions (Addendum)
Standing Labs We placed an order today for your standing lab work.    Please come back and get your standing labs in September 2018 then every 3 months  We have open lab Monday through Friday from 8:30-11:30 AM and 1:30-4 PM at the office of Dr. [name].   The office is located at [address], [place], [place], [place] [address] No appointment is necessary.   Labs are drawn by [facility].  You may receive a bill from [facility] for your lab work. If you have any questions regarding directions or hours of operation,  please call [phone].     Iliotibial Bursitis Rehab Ask your health care provider which exercises are safe for you. Do exercises exactly as told by your health care provider and adjust them as directed. It is normal to feel mild stretching, pulling, tightness, or discomfort as you do these exercises, but you should stop right away if you feel sudden pain or your pain gets worse.Do not begin these exercises until told by your health care provider. Stretching and range of motion exercises These exercises warm up your muscles and joints and improve the movement and flexibility of your leg. These exercises also help to relieve pain and stiffness. Exercise A: Quadriceps stretch, prone  1. Lie on your abdomen on a firm surface, such as a bed or padded floor. 2. Bend your left / right knee and hold your ankle. If you cannot reach your ankle or pant leg, loop a belt around your foot and grab the belt instead. 3. Gently pull your heel toward your buttocks. Your knee should not slide out to the side. You should feel a stretch in the front of your thigh and knee. 4. Hold this position for __________ seconds. Repeat __________ times. Complete this exercise __________ times a day. Exercise B: Lunge ( adductor stretch) 1. Stand and spread your legs about 3 feet (about 1 m) apart. Put your left / right leg slightly back for balance. 2. Lean away from your left / right leg by  bending your other knee and shifting your weight toward your bent knee. You may rest your hands on your thigh for balance. You should feel a stretch in your left / right inner thigh. 3. Hold for __________ seconds. Repeat __________ times. Complete this exercise __________ times a day. Exercise C: Hamstring stretch, supine  1. Lie on your back. 2. Hold both ends of a belt or towel as you loop it over the ball of your left / right foot. The ball of your foot is on the walking surface, right under your toes. 3. Straighten your left / right knee and slowly pull on the belt to raise your leg. Stop when you feel a gentle stretch in the back of your left / right knee or thigh. ? Do not let your left / right knee bend. ? Keep your other leg flat on the floor. 4. Hold this position for __________ seconds. Repeat __________ times. Complete this exercise __________ times a day. Strengthening exercises These exercises build strength and endurance in your leg. Endurance is the ability to use your muscles for a long time, even after they get tired. Exercise D: Quadriceps wall slides  1. Lean your back against a smooth wall or door while you walk your feet out 18-24 inches (46-61 cm) from it. 2. Place your feet hip-width apart. 3. Slowly slide down the wall or door until your knees bend as far as told by your health  care provider. Keep your knees over your heels, not your toes. Keep your knees in line with your hips. 4. Hold for __________ seconds. 5. Push through your heels to stand up to rest for __________ seconds after each repetition. Repeat __________ times. Complete this exercise __________ times a day. Exercise E: Straight leg raises ( hip abductors) 1. Lie on your side, with your left / right leg in the top position. Lie so your head, shoulder, knee, and hip line up with each other. You may bend your bottom knee to help you balance. 2. Lift your top leg 4-6 inches (10-15 cm) while keeping your  toes pointed straight ahead. 3. Hold this position for __________ seconds. 4. Slowly lower your leg to the starting position. Allow your muscles to relax completely after each repetition. Repeat __________ times. Complete this exercise __________ times a day. Exercise F: Straight leg raises ( hip extensors) 1. Lie on your abdomen on a firm surface. You can put a pillow under your hips if that is more comfortable. 2. Tense the muscles in your buttocks and lift your left / right leg about 4-6 inches (10-15 cm). Keep your knee straight as you lift your leg. 3. Hold this position for __________ seconds. 4. Slowly lower your leg to the starting position. 5. Let your leg relax completely after each repetition. Repeat __________ times. Complete this exercise __________ times a day. Exercise G: Bridge ( hip extensors) 1. Lie on your back on a firm surface with your knees bent and your feet flat on the floor. 2. Tighten your buttocks muscles and lift your bottom off the floor until your trunk is level with your thighs. ? Do not arch your back. ? You should feel the muscles working in your buttocks and the back of your thighs. If you do not feel these muscles, slide your feet 1-2 inches (2.5-5 cm) farther away from your buttocks. 3. Hold this position for __________ seconds. 4. Slowly lower your hips to the starting position. 5. Let your buttocks muscles relax completely between repetitions. 6. If this exercise is too easy, try doing it with your arms crossed over your chest. Repeat __________ times. Complete this exercise __________ times a day. This information is not intended to replace advice given to you by your health care provider. Make sure you discuss any questions you have with your health care provider. Document Released: 08/07/2005 Document Revised: 04/13/2016 Document Reviewed: 07/20/2015 Elsevier Interactive Patient Education  [employer].

## 2017-04-06 NOTE — Progress Notes (Signed)
Rheumatology Medication Review by a Pharmacist Does the patient feel that his/her medications are working for him/her?  Patient started Orencia on 03/21/17.   Has the patient been experiencing any side effects to the medications prescribed? Patient reports some low back and hip pain.  This proceeded the Orencia.   Does the patient have any problems obtaining medications?  Yes  Issues to address at subsequent visits: None   Pharmacist comments:  [name] is a 66 yo F who presents for follow up of rheumatoid arthritis.  She is currently taking methotrexate 20 mg weekly, folic acid 2 mg daily, and Orencia 125 mg weekly.  Patient started Orencia on 03/21/17.  She has not noticed much improvement at this time.  She will be due for standing labs in September then every 3 months.  Most recent TB Gold negative on 03/06/17.  She will be due for TB Gold again in July 2019.  Patient denies any questions regarding her medications at this time.   [name], Pharm.D., BCPS, CPP Clinical Pharmacist Pager: [phone] Phone: [phone] 04/06/2017 10:59 AM

## 2017-04-27 ENCOUNTER — Other Ambulatory Visit: Payer: Self-pay | Admitting: Family Medicine

## 2017-05-01 ENCOUNTER — Other Ambulatory Visit: Payer: Self-pay | Admitting: Rheumatology

## 2017-05-01 LAB — COMPLETE METABOLIC PANEL WITH GFR
AG RATIO: 1.7 (calc) (ref 1.0–2.5)
ALBUMIN MSPROF: 4 g/dL (ref 3.6–5.1)
ALT: 20 U/L (ref 6–29)
AST: 21 U/L (ref 10–35)
Alkaline phosphatase (APISO): 100 U/L (ref 33–130)
BILIRUBIN TOTAL: 0.7 mg/dL (ref 0.2–1.2)
BUN: 13 mg/dL (ref 7–25)
CALCIUM: 10.3 mg/dL (ref 8.6–10.4)
CO2: 27 mmol/L (ref 20–32)
Chloride: 105 mmol/L (ref 98–110)
Creat: 0.78 mg/dL (ref 0.50–0.99)
GFR, EST AFRICAN AMERICAN: 92 mL/min/{1.73_m2} (ref >=60)
GFR, EST NON AFRICAN AMERICAN: 80 mL/min/{1.73_m2} (ref >=60)
Globulin: 2.3 g/dL (calc) (ref 1.9–3.7)
Glucose, Bld: 94 mg/dL (ref 65–99)
POTASSIUM: 4.8 mmol/L (ref 3.5–5.3)
Sodium: 138 mmol/L (ref 135–146)
TOTAL PROTEIN: 6.3 g/dL (ref 6.1–8.1)

## 2017-05-01 LAB — CBC WITH DIFFERENTIAL/PLATELET
Basophils Absolute: 32 cells/uL (ref 0–200)
Basophils Relative: 0.6 %
EOS ABS: 81 {cells}/uL (ref 15–500)
Eosinophils Relative: 1.5 %
HCT: 36.4 % (ref 35.0–45.0)
Hemoglobin: 11.8 g/dL (ref 11.7–15.5)
Lymphs Abs: 1944 cells/uL (ref 850–3900)
MCH: 28 pg (ref 27.0–33.0)
MCHC: 32.4 g/dL (ref 32.0–36.0)
MCV: 86.5 fL (ref 80.0–100.0)
MONOS PCT: 10.3 %
MPV: 9.9 fL (ref 7.5–12.5)
Neutro Abs: 2786 cells/uL (ref 1500–7800)
Neutrophils Relative %: 51.6 %
PLATELETS: 255 10*3/uL (ref 140–400)
RBC: 4.21 10*6/uL (ref 3.80–5.10)
RDW: 18 % — ABNORMAL HIGH (ref 11.0–15.0)
TOTAL LYMPHOCYTE: 36 %
WBC mixed population: 556 cells/uL (ref 200–950)
WBC: 5.4 10*3/uL (ref 3.8–10.8)

## 2017-05-11 ENCOUNTER — Encounter: Payer: Self-pay | Admitting: Rheumatology

## 2017-05-11 ENCOUNTER — Telehealth: Payer: Self-pay | Admitting: Radiology

## 2017-05-11 NOTE — Telephone Encounter (Signed)
I am concerned about patients message regarding swelling of her ankles. I left message for her to call me back    She has indicated her ankles and knees which have been replaced are swollen, I have told her to see Dr Wolfgang Phoenix in the my chart message  To you FYI

## 2017-05-21 ENCOUNTER — Ambulatory Visit (INDEPENDENT_AMBULATORY_CARE_PROVIDER_SITE_OTHER): Payer: BLUE CROSS/BLUE SHIELD | Admitting: Family Medicine

## 2017-05-21 ENCOUNTER — Encounter: Payer: Self-pay | Admitting: Family Medicine

## 2017-05-21 VITALS — BP 122/80 | Ht 65.0 in | Wt 248.0 lb

## 2017-05-21 DIAGNOSIS — I878 Other specified disorders of veins: Secondary | ICD-10-CM

## 2017-05-21 DIAGNOSIS — I10 Essential (primary) hypertension: Secondary | ICD-10-CM

## 2017-05-21 DIAGNOSIS — F341 Dysthymic disorder: Secondary | ICD-10-CM

## 2017-05-21 DIAGNOSIS — E785 Hyperlipidemia, unspecified: Secondary | ICD-10-CM | POA: Diagnosis not present

## 2017-05-21 DIAGNOSIS — M05741 Rheumatoid arthritis with rheumatoid factor of right hand without organ or systems involvement: Secondary | ICD-10-CM | POA: Diagnosis not present

## 2017-05-21 DIAGNOSIS — M05742 Rheumatoid arthritis with rheumatoid factor of left hand without organ or systems involvement: Secondary | ICD-10-CM | POA: Diagnosis not present

## 2017-05-21 DIAGNOSIS — Z13220 Encounter for screening for lipoid disorders: Secondary | ICD-10-CM | POA: Diagnosis not present

## 2017-05-21 DIAGNOSIS — I872 Venous insufficiency (chronic) (peripheral): Secondary | ICD-10-CM

## 2017-05-21 MED ORDER — ATORVASTATIN CALCIUM 40 MG PO TABS: ORAL_TABLET | ORAL | 1 refills | Status: DC

## 2017-05-21 MED ORDER — ALBUTEROL SULFATE HFA 108 (90 BASE) MCG/ACT IN AERS: 2.0000 | INHALATION_SPRAY | Freq: Four times a day (QID) | RESPIRATORY_TRACT | 5 refills | Status: DC | PRN

## 2017-05-21 MED ORDER — CHLORZOXAZONE 500 MG PO TABS: 500.0000 mg | ORAL_TABLET | Freq: Three times a day (TID) | ORAL | 1 refills | Status: DC | PRN

## 2017-05-21 NOTE — Progress Notes (Signed)
Subjective:    Patient ID: [name], female    DOB: 1950/09/14, 66 y.o.   MRN: [id number] Patient arrives office with numerous concerns Anxiety  Presents for initial visit.    Patient is here today states she is here today for "Changes". She does not sleep well, she is anxious. She states her moods have changed.She is nervous about things.Her personality has changed. Thinks due to Ra medication.  Pt having more pain now that she is off the pred  Also a lot more challeng w with anxiety and feeling down, sees a "personality shift". Also thinks stress from incr pain is now causing more anxiety    Pt took a fall, day after called to make an apt here. Was in the bath tub, and banged head on shower enclosure, Bruised left arm and left leg, pain at base of sckull, aching at times   Pos hx of of depr and anxiety ovr the yrs, but much worse since starting this med per pt  First immune moduator that she has tried  Pt was on pred as needed, aand found that it was helpful, frustrated that it is been stopped.  Blood pressure medicine and blood pressure levels reviewed today with patient. Compliant with blood pressure medicine. States does not miss a dose. No obvious side effects. Blood pressure generally good when checked elsewhere. Watching salt intake.   Patient continues to take lipid medication regularly. No obvious side effects from it. Generally does not miss a dose. Prior blood work results are reviewed with patient. Patient continues to work on fat intake in diet  Patient concerned about progressive swelling on the ankles. Also was told this could be something serious.  Review of Systems No headache, no major weight loss or weight gain, no chest pain no back pain abdominal pain no change in bowel habits complete ROS otherwise negative     Objective:   Physical Exam  Alert and oriented, vitals reviewed and stable, NAD ENT-TM's and ext canals WNL bilat via otoscopic  exam Soft palate, tonsils and post pharynx WNL via oropharyngeal exam Neck-symmetric, no masses; thyroid nonpalpable and nontender Pulmonary-no tachypnea or accessory muscle use; Clear without wheezes via auscultation Card--no abnrml murmurs, rhythm reg and rate WNL Carotid pulses symmetric, without bruits Substantial o positive inflammatory changes notedand joints of hand  Ankles bilateral 1+ edema but no swelling non-15 in nature. Arterial pulses sensation all excellent.      Assessment & Plan:  Impression 1 peripheral edema/swelling of ankles. Multi-factorial. Highly doubt serious etiology such as heart failure/central vein compression/renal or liver insufficiency or hypoalbuminemia.lungs Crystal clear and heart rhythm normal chance of CHF extremely low. Recently met 7 liver enzymes fine.   More likely due to bilateral venous stasisbilateral adipose tissue/inactivity/ompression of length systems secondary to excess weight all discussed at great length with patient.   #2 worsening of anxiety and depression has had over ors. Feels worse. Patient feels the new rheumatology medicine, has something to do with this. Patient not really interested in antidepressant at this time.May seek out counseling on her own. Try melatonin for sleep.I asked patient to keep an open mind that this could be worsened due to the stress of her illness and not a specific medicine reaction.   3 hypertension good control discussed maintain same  #4 hyperlipidemia blood work uncertain lipid ordered prior blood wk reviewed to in same pending  5 reactive airways/asthma clinically stable  Greater than 50% of this 40 minute face  to face visit was spent in counseling and discussion and coordination of care regarding the above diagnosis/diagnosies

## 2017-05-22 ENCOUNTER — Other Ambulatory Visit: Payer: Self-pay | Admitting: *Deleted

## 2017-05-22 ENCOUNTER — Telehealth: Payer: Self-pay | Admitting: Family Medicine

## 2017-05-22 MED ORDER — ALBUTEROL SULFATE HFA 108 (90 BASE) MCG/ACT IN AERS: 2.0000 | INHALATION_SPRAY | Freq: Four times a day (QID) | RESPIRATORY_TRACT | 5 refills | Status: DC | PRN

## 2017-05-22 NOTE — Telephone Encounter (Signed)
Received fax from pharmacy stating that patient's Proventil HFA inhaler is not covered by insurance. Patient's insurance covers ProAir, Levalbuterol, Proair Respiclick. Please advise? (send message to clinical pool)

## 2017-05-22 NOTE — Telephone Encounter (Signed)
proair fine 6 ref

## 2017-05-22 NOTE — Telephone Encounter (Signed)
done

## 2017-05-29 ENCOUNTER — Other Ambulatory Visit: Payer: Self-pay | Admitting: Rheumatology

## 2017-05-29 NOTE — Telephone Encounter (Signed)
Last Visit: 04/06/17 Next Visit: 08/27/17 Labs: 05/01/17 WNL  Okay to refill per Dr. Estanislado Pandy

## 2017-06-04 ENCOUNTER — Other Ambulatory Visit: Payer: Self-pay | Admitting: Rheumatology

## 2017-06-04 NOTE — Telephone Encounter (Signed)
Last visit: 04/06/17 Next visit: 08/27/17 Labs: 05/01/17 Normal  TB Gold: 03/06/17 Negative   Ok to refill per Dr. Estanislado Pandy.

## 2017-06-08 ENCOUNTER — Telehealth: Payer: Self-pay

## 2017-06-08 NOTE — Telephone Encounter (Signed)
Patient was calling concerning a message from her PCP to Dr. Estanislado Pandy.

## 2017-06-11 ENCOUNTER — Encounter: Payer: Self-pay | Admitting: Rheumatology

## 2017-06-12 ENCOUNTER — Telehealth: Payer: Self-pay | Admitting: *Deleted

## 2017-06-12 ENCOUNTER — Telehealth: Payer: Self-pay

## 2017-06-12 NOTE — Telephone Encounter (Signed)
Spoke with patient and advised she does not need labs until December 2018. Scheduled patient for an appointment to discuss medications on 06/20/17 @ 2:45 pm.

## 2017-06-12 NOTE — Telephone Encounter (Signed)
Attempted to contact the patient and left message for patient to call the office.  

## 2017-06-12 NOTE — Telephone Encounter (Signed)
Error

## 2017-06-12 NOTE — Telephone Encounter (Signed)
Please discuss a standing orders with patient.

## 2017-06-12 NOTE — Telephone Encounter (Signed)
Patient was returning [name]'s call.  Cb# is [phone].  Please advise.  Thank You.

## 2017-06-18 NOTE — Progress Notes (Signed)
Office Visit Note  Patient: [name]             Date of Birth: May 28, 1951           MRN: [id number]             PCP: [name], MD Referring: [name], MD Visit Date: 06/20/2017 Occupation: @GUAROCC @    Subjective:  Arthritis (RA is progressing per patient, she is falling more and walking with a cane )   History of Present Illness: [name] is a 66 y.o. female with history of sero positive rheumatoid arthritis. She was started on Orencia on 03/21/2017. She took it about 6-7 weeks. Patient states that she started experiencing increased anxiety, depression, increased joint pain and swelling. She discussed this with Dr. Liborio Nixon and decided to come off the medication. She states that after coming off the medication for anxiety and depression got better and her swelling got better. She has fallen 3 times since the last visit. She has some bruising on her lower extremities. She states she continues to have joint pain but does not have any joint swelling.  Activities of Daily Living:  Patient reports morning stiffness for 1 hour.   Patient Reports nocturnal pain.  Difficulty dressing/grooming: Denies Difficulty climbing stairs: Reports Difficulty getting out of chair: Reports Difficulty using hands for taps, buttons, cutlery, and/or writing: Denies   Review of Systems  Constitutional: Positive for fatigue. Negative for night sweats, weight gain, weight loss and weakness.  HENT: Positive for hearing loss. Negative for mouth sores, trouble swallowing, trouble swallowing, mouth dryness and nose dryness.   Eyes: Positive for dryness. Negative for pain, redness, itching and visual disturbance.  Respiratory: Negative for cough, shortness of breath and difficulty breathing.   Cardiovascular: Positive for swelling in legs/feet. Negative for chest pain, palpitations, hypertension and irregular heartbeat.  Gastrointestinal: Negative for blood in stool, constipation,  diarrhea and heartburn.  Endocrine: Negative for cold intolerance and increased urination.  Genitourinary: Negative for pelvic pain and vaginal dryness.  Musculoskeletal: Positive for arthralgias, gait problem, joint pain, joint swelling and morning stiffness. Negative for myalgias, muscle weakness, muscle tenderness and myalgias.  Skin: Negative for color change, rash, hair loss, skin tightness, ulcers and sensitivity to sunlight.  Allergic/Immunologic: Negative for susceptible to infections.  Neurological: Positive for dizziness. Negative for headaches, memory loss and night sweats.  Hematological: Negative for anemia, bruising/bleeding tendency and swollen glands.  Psychiatric/Behavioral: Positive for depressed mood. Negative for sleep disturbance. The patient is nervous/anxious.     PMFS History:  Patient Active Problem List   Diagnosis Date Noted  . History of MRSA infection 04/04/2017  . High risk medication use 03/02/2017  . Hypercalcemia 03/02/2017  . Pelvic pain in female 08/25/2015  . Hematuria 08/25/2015  . History of adenomatous polyp of colon 10/01/2014  . Seropositive rheumatoid arthritis of multiple sites ([place]) 07/20/2014  . History of juvenile rheumatoid arthritis 07/20/2014  . Impaired fasting glucose 07/20/2014  . NEOPLASM, SKIN, UNCERTAIN BEHAVIOR 77/41/2878  . Unilateral primary osteoarthritis, right knee 09/23/2008  . CALF PAIN, RIGHT 09/23/2008  . ANXIETY DEPRESSION 08/07/2008  . OBESITY 12/19/2007  . DDD (degenerative disc disease), lumbar 06/27/2007  . Osteopenia 06/27/2007  . HIP PAIN, LEFT 06/18/2007  . CONSTIPATION 05/16/2007  . Essential hypertension 04/04/2007  . Psoriasis 03/07/2007  . OSTEOARTHROSIS, GENERALIZED, MULTIPLE SITES 03/07/2007  . ANOSMIA 03/07/2007  . LIPOMA 02/21/2007  . Hyperlipidemia LDL goal <130 02/21/2007  . ALLERGIC RHINITIS 02/21/2007  .  ASTHMA 02/21/2007  . ARTHRITIS 02/21/2007  . KNEE PAIN, LEFT 02/21/2007  . LOW BACK  PAIN, CHRONIC 02/21/2007    Past Medical History:  Diagnosis Date  . Allergic rhinitis   . Arthritis   . Asthma   . Hematuria 08/25/2015  . Hyperlipidemia   . Hypertension   . Obesity   . Pelvic pain in female 08/25/2015    Family History  Problem Relation Age of Onset  . Heart attack Father   . Leukemia Mother   . Obesity Sister   . Other Sister        breathing problems  . Hepatitis C Sister   . Arthritis Sister   . Emphysema Brother   . Obesity Daughter   . Polycystic ovary syndrome Daughter   . Obesity Son   . Other Son        knee problems  . Cancer Maternal Grandmother        breast  . Other Brother        aneursym  . Arthritis Sister   . Obesity Son   . Asthma Son   . Obesity Son   . Diabetes Other        runs on dad's side of the family  . Colon cancer Neg Hx        states she doesn't know a lot about her family, but has never heard of specific colon ca   Past Surgical History:  Procedure Laterality Date  . BACK SURGERY    . CHOLECYSTECTOMY    . COLONOSCOPY  2007   sessile sigmoid polyp x 1; path: serrated adenoma  . COLONOSCOPY N/A 10/12/2014   Procedure: COLONOSCOPY;  Surgeon: [name], MD;  Location: [facility];  Service: Endoscopy;  Laterality: N/A;  . JOINT REPLACEMENT     BIL knee   . KNEE ARTHROPLASTY    . KNEE SURGERY     bilateral knee replacement  . LIPOMA EXCISION     x 2  . TONSILLECTOMY AND ADENOIDECTOMY     Social History   Social History Narrative  . No narrative on file     Objective: Vital Signs: BP 132/64 (BP Location: Left Arm, Patient Position: Sitting, Cuff Size: Large)   Pulse 76   Resp 18   Ht 5\' 5"  (1.651 m)   Wt 255 lb (115.7 kg)   BMI 42.43 kg/m    Physical Exam  Constitutional: She is oriented to person, place, and time. She appears well-developed and well-nourished.  HENT:  Head: Normocephalic and atraumatic.  Eyes: Conjunctivae and EOM are normal.  Neck: Normal range of motion.  Cardiovascular:  Normal rate, regular rhythm, normal heart sounds and intact distal pulses.   Pulmonary/Chest: Effort normal and breath sounds normal.  Abdominal: Soft. Bowel sounds are normal.  Lymphadenopathy:    She has no cervical adenopathy.  Neurological: She is alert and oriented to person, place, and time.  Skin: Skin is warm and dry. Capillary refill takes less than 2 seconds.  Psychiatric: She has a normal mood and affect. Her behavior is normal.  Nursing note and vitals reviewed.    Musculoskeletal Exam: C-spine good range of motion. She is to limit range of motion of her lumbar spine. Shoulder joints elbow joints wrist joints are good range of motion. No synovitis was noted over wrist joint or MCP joints. She has some ulnar deviation and subluxation over her MCPs. She has good range of motion of her hip joints. She has  bilateral knee replacement which appears to be doing well. She had no synovitis over her ankle joints. She has some pedal edema over bilateral lower extremities.  CDAI Exam: CDAI Homunculus Exam:   Joint Counts:  CDAI Tender Joint count: 0 CDAI Swollen Joint count: 0  Global Assessments:  Patient Global Assessment: 3 Provider Global Assessment: 5  CDAI Calculated Score: 8    Investigation: No additional findings.TB Gold:02/2017 Negative  CBC Latest Ref Rng & Units 05/01/2017 03/06/2017 03/03/2010  WBC 3.8 - 10.8 Thousand/uL 5.4 8.4 5.6  Hemoglobin 11.7 - 15.5 g/dL 11.8 11.7 9.8(L)  Hematocrit 35.0 - 45.0 % 36.4 37.1 28.4(L)  Platelets 140 - 400 Thousand/uL 255 243 206   CMP Latest Ref Rng & Units 05/01/2017 03/06/2017 12/20/2016  Glucose 65 - 99 mg/dL 94 89 90  BUN 7 - 25 mg/dL 13 11 16   Creatinine 0.50 - 0.99 mg/dL 0.78 0.70 0.85  Sodium 135 - 146 mmol/L 138 135 138  Potassium 3.5 - 5.3 mmol/L 4.8 3.8 5.0  Chloride 98 - 110 mmol/L 105 103 101  CO2 20 - 32 mmol/L 27 20 24   Calcium 8.6 - 10.4 mg/dL 10.3 9.7 9.9  Total Protein 6.1 - 8.1 g/dL 6.3 6.3 6.1  Total Bilirubin  0.2 - 1.2 mg/dL 0.7 1.2 0.4  Alkaline Phos 33 - 130 U/L - 96 100  AST 10 - 35 U/L 21 28 47(H)  ALT 6 - 29 U/L 20 43(H) 82(H)    Imaging: No results found.  Speciality Comments: No specialty comments available.    Procedures:  No procedures performed Allergies: Codeine   Assessment / Plan:     Visit Diagnoses: Seropositive rheumatoid arthritis of multiple sites ([place]) -   positive RF, positive anti-CCP Previous patient of Dr. Charlestine Night with multiple contractures. Patient's arthritis is much better with no synovitis on examination today. I reviewed her chart and she had synovitis in multiple joints during her initial visit in July. Arthritis improved remarkably after starting Orencia. Although patient denies that. She's been off [place] now. She believes that Orencia cause depression and anxiety and increased joint swelling. At this point I do not think there is any need to start any other medication besides methotrexate him a she has a flare again.  High risk medication use - Methotrexate 8 tablets by mouth every week, folic acid 2 mg by mouth daily and Orencia sq(03/21/2017,dcd due to anxiety depression and increased swelling) her labs have been stable. We will need labs every 3 months to monitor for drug toxicity.  Trochanteric bursitis of right hip: She has minimal discomfort.  History of bilateral knee replacement: She is a stiffness in her bilateral knee joints.  DDD (degenerative disc disease), lumbar - s/p lumbar fusion : Chronic pain  Dry eyes - She's been using over-the-counter products.  Psoriasis: She has scattered lesions.  Osteopenia of multiple sites - She is on calcium and vitamin D  Other medical problems are listed as follows:  History of MRSA infection - no recurrence since 2014   History of anxiety  History of depression  History of adenomatous polyp of colon  History of hyperlipidemia - LDL goal <130  History of hypertension    Orders: No orders of  the defined types were placed in this encounter.  No orders of the defined types were placed in this encounter.   Face-to-face time spent with patient was 30 minutes. Greater than 50% of time was spent in counseling and coordination of care.  Follow-Up  Instructions: Return in about 5 months (around 11/18/2017).   [name], MD  Note - This record has been created using Editor, commissioning.  Chart creation errors have been sought, but may not always  have been located. Such creation errors do not reflect on  the standard of medical care.

## 2017-06-20 ENCOUNTER — Ambulatory Visit (INDEPENDENT_AMBULATORY_CARE_PROVIDER_SITE_OTHER): Payer: BLUE CROSS/BLUE SHIELD | Admitting: Rheumatology

## 2017-06-20 ENCOUNTER — Encounter: Payer: Self-pay | Admitting: Rheumatology

## 2017-06-20 VITALS — BP 132/64 | HR 76 | Resp 18 | Ht 65.0 in | Wt 255.0 lb

## 2017-06-20 DIAGNOSIS — Z8639 Personal history of other endocrine, nutritional and metabolic disease: Secondary | ICD-10-CM

## 2017-06-20 DIAGNOSIS — M7061 Trochanteric bursitis, right hip: Secondary | ICD-10-CM

## 2017-06-20 DIAGNOSIS — L409 Psoriasis, unspecified: Secondary | ICD-10-CM

## 2017-06-20 DIAGNOSIS — Z86010 Personal history of colonic polyps: Secondary | ICD-10-CM

## 2017-06-20 DIAGNOSIS — H04123 Dry eye syndrome of bilateral lacrimal glands: Secondary | ICD-10-CM | POA: Diagnosis not present

## 2017-06-20 DIAGNOSIS — M8589 Other specified disorders of bone density and structure, multiple sites: Secondary | ICD-10-CM | POA: Diagnosis not present

## 2017-06-20 DIAGNOSIS — M5136 Other intervertebral disc degeneration, lumbar region: Secondary | ICD-10-CM

## 2017-06-20 DIAGNOSIS — Z96653 Presence of artificial knee joint, bilateral: Secondary | ICD-10-CM

## 2017-06-20 DIAGNOSIS — Z8614 Personal history of Methicillin resistant Staphylococcus aureus infection: Secondary | ICD-10-CM

## 2017-06-20 DIAGNOSIS — M0579 Rheumatoid arthritis with rheumatoid factor of multiple sites without organ or systems involvement: Secondary | ICD-10-CM

## 2017-06-20 DIAGNOSIS — Z8679 Personal history of other diseases of the circulatory system: Secondary | ICD-10-CM

## 2017-06-20 DIAGNOSIS — Z79899 Other long term (current) drug therapy: Secondary | ICD-10-CM

## 2017-06-20 DIAGNOSIS — Z8659 Personal history of other mental and behavioral disorders: Secondary | ICD-10-CM

## 2017-06-20 NOTE — Patient Instructions (Signed)
Standing Labs We placed an order today for your standing lab work.    Please come back and get your standing labs in December and every 3 months  We have open lab Monday through Friday from 8:30-11:30 AM and 1:30-4 PM at the office of Dr. Artist Bloom.   The office is located at [address], [address], [place],  [address] No appointment is necessary.   Labs are drawn by [facility].  You may receive a bill from [facility] for your lab work. If you have any questions regarding directions or hours of operation,  please call [phone].    

## 2017-06-27 ENCOUNTER — Ambulatory Visit: Payer: BLUE CROSS/BLUE SHIELD | Admitting: Family Medicine

## 2017-07-15 ENCOUNTER — Other Ambulatory Visit: Payer: Self-pay | Admitting: Family Medicine

## 2017-08-03 ENCOUNTER — Other Ambulatory Visit: Payer: Self-pay | Admitting: Rheumatology

## 2017-08-03 NOTE — Telephone Encounter (Signed)
Last Visit: 06/20/17 Next visit: 11/30/17 Labs: 05/01/17 WNL   Okay to refill per Dr. Estanislado Pandy

## 2017-08-27 ENCOUNTER — Ambulatory Visit: Payer: BLUE CROSS/BLUE SHIELD | Admitting: Rheumatology

## 2017-10-24 ENCOUNTER — Other Ambulatory Visit: Payer: Self-pay | Admitting: Rheumatology

## 2017-10-24 ENCOUNTER — Other Ambulatory Visit: Payer: Self-pay | Admitting: Family Medicine

## 2017-10-24 ENCOUNTER — Telehealth: Payer: Self-pay | Admitting: Rheumatology

## 2017-10-24 DIAGNOSIS — Z79899 Other long term (current) drug therapy: Secondary | ICD-10-CM

## 2017-10-24 NOTE — Telephone Encounter (Signed)
Last Visit: 06/20/17 Next visit: 11/30/17 Labs: 05/01/17 WNL

## 2017-10-24 NOTE — Telephone Encounter (Signed)
Patient left a voicemail requesting labwork orders to be sent to [facility] in [place].

## 2017-10-24 NOTE — Telephone Encounter (Signed)
Lab Orders Released.

## 2017-10-24 NOTE — Telephone Encounter (Signed)
Left message to advise patient she is due to update labs.   Okay to refill 30 day supply per Dr. Estanislado Pandy

## 2017-11-07 ENCOUNTER — Encounter: Payer: Self-pay | Admitting: Family Medicine

## 2017-11-07 LAB — CMP14+EGFR
A/G RATIO: 1.5 (ref 1.2–2.2)
ALK PHOS: 103 IU/L (ref 39–117)
ALT: 23 IU/L (ref 0–32)
AST: 24 IU/L (ref 0–40)
Albumin: 4.1 g/dL (ref 3.6–4.8)
BUN/Creatinine Ratio: 12 (ref 12–28)
BUN: 11 mg/dL (ref 8–27)
Bilirubin Total: 0.6 mg/dL (ref 0.0–1.2)
CHLORIDE: 105 mmol/L (ref 96–106)
CO2: 21 mmol/L (ref 20–29)
Calcium: 10.5 mg/dL — ABNORMAL HIGH (ref 8.7–10.3)
Creatinine, Ser: 0.9 mg/dL (ref 0.57–1.00)
GFR calc Af Amer: 77 mL/min/{1.73_m2} (ref >59)
GFR calc non Af Amer: 67 mL/min/{1.73_m2} (ref >59)
GLOBULIN, TOTAL: 2.7 g/dL (ref 1.5–4.5)
Glucose: 109 mg/dL — ABNORMAL HIGH (ref 65–99)
POTASSIUM: 5.2 mmol/L (ref 3.5–5.2)
Sodium: 141 mmol/L (ref 134–144)
Total Protein: 6.8 g/dL (ref 6.0–8.5)

## 2017-11-07 LAB — CBC WITH DIFFERENTIAL/PLATELET
Basophils Absolute: 0 x10E3/uL (ref 0.0–0.2)
Basos: 0 %
EOS (ABSOLUTE): 0.2 x10E3/uL (ref 0.0–0.4)
Eos: 3 %
Hematocrit: 38 % (ref 34.0–46.6)
Hemoglobin: 12.2 g/dL (ref 11.1–15.9)
Immature Grans (Abs): 0 x10E3/uL (ref 0.0–0.1)
Immature Granulocytes: 0 %
Lymphocytes Absolute: 1.8 x10E3/uL (ref 0.7–3.1)
Lymphs: 31 %
MCH: 27.1 pg (ref 26.6–33.0)
MCHC: 32.1 g/dL (ref 31.5–35.7)
MCV: 84 fL (ref 79–97)
Monocytes Absolute: 0.7 x10E3/uL (ref 0.1–0.9)
Monocytes: 11 %
Neutrophils Absolute: 3.2 x10E3/uL (ref 1.4–7.0)
Neutrophils: 55 %
Platelets: 291 x10E3/uL (ref 150–379)
RBC: 4.5 x10E6/uL (ref 3.77–5.28)
RDW: 20.4 % — ABNORMAL HIGH (ref 12.3–15.4)
WBC: 5.8 x10E3/uL (ref 3.4–10.8)

## 2017-11-07 LAB — LIPID PANEL
Chol/HDL Ratio: 3.8 ratio (ref 0.0–4.4)
Cholesterol, Total: 218 mg/dL — ABNORMAL HIGH (ref 100–199)
HDL: 58 mg/dL (ref >39)
LDL Calculated: 146 mg/dL — ABNORMAL HIGH (ref 0–99)
Triglycerides: 68 mg/dL (ref 0–149)
VLDL CHOLESTEROL CAL: 14 mg/dL (ref 5–40)

## 2017-11-07 NOTE — Telephone Encounter (Signed)
Labs are stable. Ca is mildly elevated. Total Ca intake should be 1200 mg ( meals + supplements)

## 2017-11-19 ENCOUNTER — Ambulatory Visit: Payer: BLUE CROSS/BLUE SHIELD | Admitting: Family Medicine

## 2017-11-19 NOTE — Progress Notes (Signed)
Office Visit Note  Patient: [name]             Date of Birth: 11/20/1950           MRN: [id number]             PCP: [name], MD Referring: [name], MD Visit Date: 11/30/2017 Occupation: @GUAROCC @    Subjective:  Pain in multiple joints   History of Present Illness: [name] is a 67 y.o. female with history of seropositive rheumatoid arthritis and DDD.  Patient states she continues to take methotrexate 8 tablets weekly and folic acid 2 mg daily.  She states that she is having increased pain in multiple joints.  She states that today she is having pain in her bilateral feet bilateral wrists and right shoulder.  She states that her hands have been doing well.  She states that she continues to have swelling in her left ankle and bilateral knees.  She states that overall her bilateral knee replacements are doing well.  She does states she has had 2 falls since her last visit though.  She states that her pain has been preventing her from doing things she would like to do.  She states she continues to have chronic pain in her lower back.   Activities of Daily Living:  Patient reports morning stiffness for 2  hours.   Patient Reports nocturnal pain.  Difficulty dressing/grooming: Denies Difficulty climbing stairs: Reports Difficulty getting out of chair: Denies Difficulty using hands for taps, buttons, cutlery, and/or writing: Reports   Review of Systems  Constitutional: Positive for fatigue.  HENT: Negative for mouth sores, mouth dryness and nose dryness.   Eyes: Positive for dryness. Negative for pain and visual disturbance.  Respiratory: Negative for cough, hemoptysis, shortness of breath and difficulty breathing.   Cardiovascular: Negative for chest pain, palpitations, hypertension and swelling in legs/feet.  Gastrointestinal: Negative for blood in stool, constipation and diarrhea.  Endocrine: Negative for increased urination.  Genitourinary:  Negative for painful urination.  Musculoskeletal: Positive for arthralgias, joint pain, joint swelling, morning stiffness and muscle tenderness. Negative for myalgias, muscle weakness and myalgias.  Skin: Positive for nodules/bumps. Negative for color change, pallor, rash, hair loss, skin tightness, ulcers and sensitivity to sunlight.  Allergic/Immunologic: Negative for susceptible to infections.  Neurological: Negative for dizziness, numbness, headaches and weakness.  Hematological: Negative for swollen glands.  Psychiatric/Behavioral: Positive for depressed mood and sleep disturbance. The patient is nervous/anxious.     PMFS History:  Patient Active Problem List   Diagnosis Date Noted  . History of MRSA infection 04/04/2017  . High risk medication use 03/02/2017  . Hypercalcemia 03/02/2017  . Pelvic pain in female 08/25/2015  . Hematuria 08/25/2015  . History of adenomatous polyp of colon 10/01/2014  . Seropositive rheumatoid arthritis of multiple sites ([place]) 07/20/2014  . History of juvenile rheumatoid arthritis 07/20/2014  . Impaired fasting glucose 07/20/2014  . NEOPLASM, SKIN, UNCERTAIN BEHAVIOR 56/31/4970  . Unilateral primary osteoarthritis, right knee 09/23/2008  . CALF PAIN, RIGHT 09/23/2008  . ANXIETY DEPRESSION 08/07/2008  . OBESITY 12/19/2007  . DDD (degenerative disc disease), lumbar 06/27/2007  . Osteopenia 06/27/2007  . HIP PAIN, LEFT 06/18/2007  . CONSTIPATION 05/16/2007  . Essential hypertension 04/04/2007  . Psoriasis 03/07/2007  . OSTEOARTHROSIS, GENERALIZED, MULTIPLE SITES 03/07/2007  . ANOSMIA 03/07/2007  . LIPOMA 02/21/2007  . Hyperlipidemia LDL goal <130 02/21/2007  . ALLERGIC RHINITIS 02/21/2007  . ASTHMA 02/21/2007  . ARTHRITIS  02/21/2007  . KNEE PAIN, LEFT 02/21/2007  . LOW BACK PAIN, CHRONIC 02/21/2007    Past Medical History:  Diagnosis Date  . Allergic rhinitis   . Arthritis   . Asthma   . Hematuria 08/25/2015  . Hyperlipidemia   .  Hypertension   . Obesity   . Pelvic pain in female 08/25/2015    Family History  Problem Relation Age of Onset  . Heart attack Father   . Leukemia Mother   . Obesity Sister   . Other Sister        breathing problems  . Hepatitis C Sister   . Arthritis Sister   . Emphysema Brother   . Obesity Daughter   . Polycystic ovary syndrome Daughter   . Obesity Son   . Other Son        knee problems  . Cancer Maternal Grandmother        breast  . Other Brother        aneursym  . Arthritis Sister   . Obesity Son   . Asthma Son   . Obesity Son   . Diabetes Other        runs on dad's side of the family  . Colon cancer Neg Hx        states she doesn't know a lot about her family, but has never heard of specific colon ca   Past Surgical History:  Procedure Laterality Date  . BACK SURGERY    . CHOLECYSTECTOMY    . COLONOSCOPY  2007   sessile sigmoid polyp x 1; path: serrated adenoma  . COLONOSCOPY N/A 10/12/2014   Procedure: COLONOSCOPY;  Surgeon: [name], MD;  Location: [facility];  Service: Endoscopy;  Laterality: N/A;  . JOINT REPLACEMENT     BIL knee   . KNEE ARTHROPLASTY    . KNEE SURGERY     bilateral knee replacement  . LIPOMA EXCISION     x 2  . TONSILLECTOMY AND ADENOIDECTOMY     Social History   Social History Narrative  . Not on file     Objective: Vital Signs: BP 124/60 (BP Location: Left Arm, Patient Position: Sitting, Cuff Size: Large)   Pulse 77   Resp 16   Ht 5\' 4"  (1.626 m)   Wt 260 lb (117.9 kg)   BMI 44.63 kg/m    Physical Exam  Constitutional: She is oriented to person, place, and time. She appears well-developed and well-nourished.  HENT:  Head: Normocephalic and atraumatic.  Eyes: Conjunctivae and EOM are normal.  Neck: Normal range of motion.  Cardiovascular: Normal rate, regular rhythm, normal heart sounds and intact distal pulses.  Pulmonary/Chest: Effort normal and breath sounds normal.  Abdominal: Soft. Bowel sounds are normal.   Lymphadenopathy:    She has no cervical adenopathy.  Neurological: She is alert and oriented to person, place, and time.  Skin: Skin is warm and dry. Capillary refill takes less than 2 seconds.  Psychiatric: She has a normal mood and affect. Her behavior is normal.  Nursing note and vitals reviewed.    Musculoskeletal Exam: C-spine thoracic spine, lumbar spine good range of motion.  She has midline spinal tenderness in lumbar region.  No SI joint tenderness.  Shoulder joints, elbow joints, wrist joints, MCPs, PIPs, DIPs good range of motion with no synovitis.  Hip joints, knee joints, ankle joints, MTPs, PIPs, DIPs good range of motion with no synovitis.  No warmth or effusion of bilateral knees.  She has left ankle swelling.  She is tenderness of all MTP joints.  She has tenderness of left trochanteric bursa.  CDAI Exam: CDAI Homunculus Exam:   Tenderness:  Left hand: 3rd MCP LLE: tibiotalar Right foot: 1st MTP, 2nd MTP, 3rd MTP, 4th MTP and 5th MTP Left foot: 1st MTP, 2nd MTP, 3rd MTP, 4th MTP and 5th MTP  Swelling:  LLE: tibiotalar  Joint Counts:  CDAI Tender Joint count: 1 CDAI Swollen Joint count: 0  Global Assessments:  Patient Global Assessment: 3 Provider Global Assessment: 3  CDAI Calculated Score: 7    Investigation: No additional findings.TB Gold: 03/06/2017 Negative  CBC Latest Ref Rng & Units 11/06/2017 05/01/2017 03/06/2017  WBC 3.4 - 10.8 x10E3/uL 5.8 5.4 8.4  Hemoglobin 11.1 - 15.9 g/dL 12.2 11.8 11.7  Hematocrit 34.0 - 46.6 % 38.0 36.4 37.1  Platelets 150 - 379 x10E3/uL 291 255 243   CMP Latest Ref Rng & Units 11/06/2017 05/01/2017 03/06/2017  Glucose 65 - 99 mg/dL 109(H) 94 89  BUN 8 - 27 mg/dL 11 13 11   Creatinine 0.57 - 1.00 mg/dL 0.90 0.78 0.70  Sodium 134 - 144 mmol/L 141 138 135  Potassium 3.5 - 5.2 mmol/L 5.2 4.8 3.8  Chloride 96 - 106 mmol/L 105 105 103  CO2 20 - 29 mmol/L 21 27 20   Calcium 8.7 - 10.3 mg/dL 10.5(H) 10.3 9.7  Total Protein 6.0 -  8.5 g/dL 6.8 6.3 6.3  Total Bilirubin 0.0 - 1.2 mg/dL 0.6 0.7 1.2  Alkaline Phos 39 - 117 IU/L 103 - 96  AST 0 - 40 IU/L 24 21 28   ALT 0 - 32 IU/L 23 20 43(H)    Imaging: No results found.  Speciality Comments: No specialty comments available.    Procedures:  No procedures performed Allergies: Codeine and Cyclosporine   Assessment / Plan:     Visit Diagnoses: Seropositive rheumatoid arthritis of multiple sites ([place]) - positive RF, positive anti-CCP. Previous patient of Dr. Charlestine Night with multiple contractures.  She is having pain in multiple joints.  She has tenderness of all MTPs and swelling of left ankle.  She was previously treated by Dr. Charlestine Night who started her on methotrexate and prednisone.  She tapered her prednisone and they added Plaquenil.  She took Plaquenil for 3 months and tolerated it well but she ran out of the prescription before she was referred to Dr. Estanislado Pandy. She was then started on Orencia in July 2018 but did not tolerate the side effects of anxiety, depression and increased swelling.  She has been having increased pain in multiple joints since being on methotrexate as monotherapy.  She has been taking leftover prednisone on a as needed basis. We discussed adding Plaquenil to her current treatment regimen. She will continue taking methotrexate 8 tablets weekly, folic acid 2 mg daily, and add Plaquenil 200 mg.    Patient was counseled on the purpose, proper use, and adverse effects of hydroxychloroquine including nausea/diarrhea, skin rash, headaches, and sun sensitivity.  Discussed importance of annual eye exams while on hydroxychloroquine to monitor to ocular toxicity and discussed importance of frequent laboratory monitoring.  Provided patient with eye exam form for baseline ophthalmologic exam.  Provided patient with educational materials on hydroxychloroquine and answered all questions.  Patient consented to hydroxychloroquine.  Will upload consent in the media tab.     High risk medication use - Methotrexate 8 tablets by mouth every week, folic acid 2 mg by mouth daily and d/c Orencia sq(03/21/2017,dcd due  to anxiety depression and increased swelling).  She takes prednisone on a as needed basis.  CBC and CMP will be due in June and every 3 months to monitor for drug toxicity.  History of bilateral knee replacement: No warmth or effusion on exam.  She experiences occasional discomfort in her bilateral knees.  DDD (degenerative disc disease), lumbar - s/p lumbar fusion: Chronic pain.  She has midline spinal tenderness in the lumbar region.  Psoriasis: No active psoriasis.  Dry eyes -she continues to have dry eyes.  She's been using over-the-counter products.  Osteopenia of multiple sites  Other medical conditions are listed as follows:  History of hypertension  History of MRSA infection - no recurrence since 2014   History of hyperlipidemia - LDL goal <130  History of anxiety  History of depression  History of adenomatous polyp of colon    Orders: No orders of the defined types were placed in this encounter.  No orders of the defined types were placed in this encounter.   Face-to-face time spent with patient was 30 minutes. >50% of time was spent in counseling and coordination of care.  Follow-Up Instructions: Return in about 5 months (around 05/02/2018) for Rheumatoid arthritis.   Ofilia Neas, PA-C   I examined and evaluated the patient with Hazel Sams PA.Patient still has some synovitis. She does not want to take any BDMARDS. We discussed starting PLQ. She was in agreement.  The plan of care was discussed as noted above.  [name], MD  Note - This record has been created using Editor, commissioning.  Chart creation errors have been sought, but may not always  have been located. Such creation errors do not reflect on  the standard of medical care.

## 2017-11-30 ENCOUNTER — Ambulatory Visit: Payer: BLUE CROSS/BLUE SHIELD | Admitting: Rheumatology

## 2017-11-30 ENCOUNTER — Encounter: Payer: Self-pay | Admitting: Rheumatology

## 2017-11-30 VITALS — BP 124/60 | HR 77 | Resp 16 | Ht 64.0 in | Wt 260.0 lb

## 2017-11-30 DIAGNOSIS — L409 Psoriasis, unspecified: Secondary | ICD-10-CM | POA: Diagnosis not present

## 2017-11-30 DIAGNOSIS — Z8639 Personal history of other endocrine, nutritional and metabolic disease: Secondary | ICD-10-CM | POA: Diagnosis not present

## 2017-11-30 DIAGNOSIS — M5136 Other intervertebral disc degeneration, lumbar region: Secondary | ICD-10-CM

## 2017-11-30 DIAGNOSIS — M8589 Other specified disorders of bone density and structure, multiple sites: Secondary | ICD-10-CM | POA: Diagnosis not present

## 2017-11-30 DIAGNOSIS — M0579 Rheumatoid arthritis with rheumatoid factor of multiple sites without organ or systems involvement: Secondary | ICD-10-CM

## 2017-11-30 DIAGNOSIS — Z86010 Personal history of colonic polyps: Secondary | ICD-10-CM

## 2017-11-30 DIAGNOSIS — Z79899 Other long term (current) drug therapy: Secondary | ICD-10-CM

## 2017-11-30 DIAGNOSIS — Z8614 Personal history of Methicillin resistant Staphylococcus aureus infection: Secondary | ICD-10-CM

## 2017-11-30 DIAGNOSIS — M51369 Other intervertebral disc degeneration, lumbar region without mention of lumbar back pain or lower extremity pain: Secondary | ICD-10-CM

## 2017-11-30 DIAGNOSIS — Z860101 Personal history of adenomatous and serrated colon polyps: Secondary | ICD-10-CM

## 2017-11-30 DIAGNOSIS — Z8679 Personal history of other diseases of the circulatory system: Secondary | ICD-10-CM

## 2017-11-30 DIAGNOSIS — H04123 Dry eye syndrome of bilateral lacrimal glands: Secondary | ICD-10-CM

## 2017-11-30 DIAGNOSIS — Z96653 Presence of artificial knee joint, bilateral: Secondary | ICD-10-CM

## 2017-11-30 DIAGNOSIS — Z8659 Personal history of other mental and behavioral disorders: Secondary | ICD-10-CM | POA: Diagnosis not present

## 2017-11-30 MED ORDER — HYDROXYCHLOROQUINE SULFATE 200 MG PO TABS: ORAL_TABLET | ORAL | 0 refills | Status: DC

## 2017-11-30 NOTE — Patient Instructions (Addendum)
Standing Labs We placed an order today for your standing lab work.    Please come back and get your standing labs in June and every 3 months  We have open lab Monday through Friday from 8:30-11:30 AM and 1:30-4:00 PM  at the office of Dr. [name].   You may experience shorter wait times on Monday and Friday afternoons. The office is located at [address] [place], [place], [place] [address] No appointment is necessary.   Labs are drawn by [facility].  You may receive a bill from [facility] for your lab work. If you have any questions regarding directions or hours of operation,  please call [phone].     Hydroxychloroquine tablets What is this medicine? HYDROXYCHLOROQUINE (hye drox ee KLOR oh kwin) is used to treat rheumatoid arthritis and systemic lupus erythematosus. It is also used to treat malaria. This medicine may be used for other purposes; ask your health care provider or pharmacist if you have questions. COMMON BRAND NAME(S): Plaquenil, Quineprox What should I tell my health care provider before I take this medicine? They need to know if you have any of these conditions: -diabetes -eye disease, vision problems -G6PD deficiency -history of blood diseases -history of irregular heartbeat -if you often drink alcohol -kidney disease -liver disease -porphyria -psoriasis -seizures -an unusual or allergic reaction to chloroquine, hydroxychloroquine, other medicines, foods, dyes, or preservatives -pregnant or trying to get pregnant -breast-feeding How should I use this medicine? Take this medicine by mouth with a glass of water. Follow the directions on the prescription label. Avoid taking antacids within 4 hours of taking this medicine. It is best to separate these medicines by at least 4 hours. Do not cut, crush or chew this medicine. You can take it with or without food. If it upsets your stomach, take it with food. Take your medicine at regular intervals. Do not  take your medicine more often than directed. Take all of your medicine as directed even if you think you are better. Do not skip doses or stop your medicine early. Talk to your pediatrician regarding the use of this medicine in children. While this drug may be prescribed for selected conditions, precautions do apply. Overdosage: If you think you have taken too much of this medicine contact a poison control center or emergency room at once. NOTE: This medicine is only for you. Do not share this medicine with others. What if I miss a dose? If you miss a dose, take it as soon as you can. If it is almost time for your next dose, take only that dose. Do not take double or extra doses. What may interact with this medicine? Do not take this medicine with any of the following medications: -cisapride -dofetilide -dronedarone -live virus vaccines -penicillamine -pimozide -thioridazine -ziprasidone This medicine may also interact with the following medications: -ampicillin -antacids -cimetidine -cyclosporine -digoxin -medicines for diabetes, like insulin, glipizide, glyburide -medicines for seizures like carbamazepine, phenobarbital, phenytoin -mefloquine -methotrexate -other medicines that prolong the QT interval (cause an abnormal heart rhythm) -praziquantel This list may not describe all possible interactions. Give your health care provider a list of all the medicines, herbs, non-prescription drugs, or dietary supplements you use. Also tell them if you smoke, drink alcohol, or use illegal drugs. Some items may interact with your medicine. What should I watch for while using this medicine? Tell your doctor or healthcare professional if your symptoms do not start to get better or if they get worse. Avoid taking antacids  within 4 hours of taking this medicine. It is best to separate these medicines by at least 4 hours. Tell your doctor or health care professional right away if you have any  change in your eyesight. Your vision and blood may be tested before and during use of this medicine. This medicine can make you more sensitive to the sun. Keep out of the sun. If you cannot avoid being in the sun, wear protective clothing and use sunscreen. Do not use sun lamps or tanning beds/booths. What side effects may I notice from receiving this medicine? Side effects that you should report to your doctor or health care professional as soon as possible: -allergic reactions like skin rash, itching or hives, swelling of the face, lips, or tongue -changes in vision -decreased hearing or ringing of the ears -redness, blistering, peeling or loosening of the skin, including inside the mouth -seizures -sensitivity to light -signs and symptoms of a dangerous change in heartbeat or heart rhythm like chest pain; dizziness; fast or irregular heartbeat; palpitations; feeling faint or lightheaded, falls; breathing problems -signs and symptoms of liver injury like dark yellow or brown urine; general ill feeling or flu-like symptoms; light-colored stools; loss of appetite; nausea; right upper belly pain; unusually weak or tired; yellowing of the eyes or skin -signs and symptoms of low blood sugar such as feeling anxious; confusion; dizziness; increased hunger; unusually weak or tired; sweating; shakiness; cold; irritable; headache; blurred vision; fast heartbeat; loss of consciousness -uncontrollable head, mouth, neck, arm, or leg movements Side effects that usually do not require medical attention (report to your doctor or health care professional if they continue or are bothersome): -anxious -diarrhea -dizziness -hair loss -headache -irritable -loss of appetite -nausea, vomiting -stomach pain This list may not describe all possible side effects. Call your doctor for medical advice about side effects. You may report side effects to FDA at 1-800-FDA-1088. Where should I keep my medicine? Keep out  of the reach of children. In children, this medicine can cause overdose with small doses. Store at room temperature between 15 and 30 degrees C (59 and 86 degrees F). Protect from moisture and light. Throw away any unused medicine after the expiration date. NOTE: This sheet is a summary. It may not cover all possible information. If you have questions about this medicine, talk to your doctor, pharmacist, or health care provider.  2018 Elsevier/Gold Standard (2016-03-22 14:16:15)

## 2018-01-17 ENCOUNTER — Other Ambulatory Visit: Payer: Self-pay | Admitting: Rheumatology

## 2018-01-17 MED ORDER — METHOTREXATE 2.5 MG PO TABS: ORAL_TABLET | ORAL | 0 refills | Status: DC

## 2018-01-17 NOTE — Telephone Encounter (Signed)
Last Visit: 11/30/17 Next Visit: 05/09/18 Labs: 11/06/17 Stable  Okay to refill per Dr. Estanislado Pandy  Left message to advise patient.

## 2018-01-17 NOTE — Telephone Encounter (Signed)
Patient called checking on the authorization for her MTX.  She called her pharmacy this morning and they stated that they are still waiting on the authorization.  Patient is completely out of the medication.  CB#[phone], you can leave her a message if she does not answer.  Thank you.

## 2018-01-22 ENCOUNTER — Other Ambulatory Visit: Payer: Self-pay | Admitting: Family Medicine

## 2018-01-22 NOTE — Telephone Encounter (Signed)
Call pt and sched six mo f u visit, once pt schedules, refill times one

## 2018-02-11 ENCOUNTER — Ambulatory Visit: Payer: BLUE CROSS/BLUE SHIELD | Admitting: Family Medicine

## 2018-02-11 ENCOUNTER — Encounter: Payer: Self-pay | Admitting: Family Medicine

## 2018-02-11 VITALS — BP 132/88 | Ht 64.0 in | Wt 256.8 lb

## 2018-02-11 DIAGNOSIS — E785 Hyperlipidemia, unspecified: Secondary | ICD-10-CM

## 2018-02-11 DIAGNOSIS — I10 Essential (primary) hypertension: Secondary | ICD-10-CM | POA: Diagnosis not present

## 2018-02-11 DIAGNOSIS — F341 Dysthymic disorder: Secondary | ICD-10-CM

## 2018-02-11 MED ORDER — ATORVASTATIN CALCIUM 40 MG PO TABS: ORAL_TABLET | ORAL | 1 refills | Status: DC

## 2018-02-11 MED ORDER — LISINOPRIL 10 MG PO TABS: 10.0000 mg | ORAL_TABLET | Freq: Every day | ORAL | 1 refills | Status: DC

## 2018-02-11 NOTE — Progress Notes (Signed)
   Subjective:    Patient ID: [name], female    DOB: 1951-06-14, 67 y.o.   MRN: [id number] Patient presents with numerous concerns HPI Pt here for 6 month follow up. Pt states no issues with blood pressure, no pressing concerns. Pt states she is more anxious than normal.  He thinks Need more places like to   Pt feeling somewhat better mentally and motionally  Still having challenges with rheim arthritis, working with dr Estanislado Pandy  Has more difficlties with   Free floating anxiety, subdtantial,    Pt notes right ear challnfeges, sense of water in the ear, pt has hx of hearing loss, notes diminshed hearing in the right ear, sometimes notes ,  More prominent off and on  Ringing in both ears substantial, tinnitus  Considering potential talk therapy for her anxiety, but not meds,.  Anxiety screening done today.  Having substantial issues with anxiety.  Also element of depression.  No thoughts about hurting herself.  Patient continues to take lipid medication regularly. No obvious side effects from it. Generally does not miss a dose. Prior blood work results are reviewed with patient. Patient continues to work on fat intake in diet  Blood pressure medicine and blood pressure levels reviewed today with patient. Compliant with blood pressure medicine. States does not miss a dose. No obvious side effects. Blood pressure generally good when checked elsewhere. Watching salt intake.         Review of Systems No headache, no major weight loss or weight gain, no chest pain no back pain abdominal pain no change in bowel habits complete ROS otherwise negative     Objective:   Physical Exam  Alert and oriented, vitals reviewed and stable, NAD ENT-TM's and ext canals WNL bilat via otoscopic exam Soft palate, tonsils and post pharynx WNL via oropharyngeal exam Neck-symmetric, no masses; thyroid nonpalpable and nontender Pulmonary-no tachypnea or accessory muscle use; Clear  without wheezes via auscultation Card--no abnrml murmurs, rhythm reg and rate WNL Carotid pulses symmetric, without bruit impression  Impression 1 hypertension.  Good control discussed      Assessment & Plan:    2.  Hyperlipidemia.  Prior blood work discussed.  To maintain same medications diet discussed  3.  Sensorineural hearing loss.  With intermittent flares particularly right ear.  No further recommendations now if were to worsen would go to ENT  4.  Chronic vomiting depression anxiety patient considering counseling  Blood work reviewed diet exercise discussed medications refilled

## 2018-02-18 ENCOUNTER — Other Ambulatory Visit: Payer: Self-pay | Admitting: Rheumatology

## 2018-02-18 NOTE — Telephone Encounter (Signed)
Last Visit: 11/30/17 Next Visit: 05/09/18  Okay to refill per Dr. Estanislado Pandy

## 2018-04-01 ENCOUNTER — Telehealth: Payer: Self-pay | Admitting: Rheumatology

## 2018-04-01 DIAGNOSIS — Z79899 Other long term (current) drug therapy: Secondary | ICD-10-CM

## 2018-04-01 MED ORDER — HYDROXYCHLOROQUINE SULFATE 200 MG PO TABS: ORAL_TABLET | ORAL | 0 refills | Status: DC

## 2018-04-01 NOTE — Telephone Encounter (Signed)
Patient called requesting prescription refill of Plaquenil 90 day supply to [facility].  Patient also requested her labwork orders to be sent [facility] on [address] in [place].  Patient states she will be going tomorrow afternoon.

## 2018-04-01 NOTE — Telephone Encounter (Signed)
Last visit: 11/30/2017 Next visit: 05/09/2018 Labs: 11/06/2017 stable  Eye exam: scheduled for 03/2018, per patient.   Lab orders have been released, per patient she is going tomorrow.   Okay to refill, per Dr. Estanislado Pandy.

## 2018-04-06 LAB — CMP14+EGFR
ALK PHOS: 91 IU/L (ref 39–117)
ALT: 17 IU/L (ref 0–32)
AST: 20 IU/L (ref 0–40)
Albumin/Globulin Ratio: 1.7 (ref 1.2–2.2)
Albumin: 4.1 g/dL (ref 3.6–4.8)
BUN/Creatinine Ratio: 14 (ref 12–28)
BUN: 11 mg/dL (ref 8–27)
Bilirubin Total: 0.6 mg/dL (ref 0.0–1.2)
CO2: 23 mmol/L (ref 20–29)
CREATININE: 0.78 mg/dL (ref 0.57–1.00)
Calcium: 9.9 mg/dL (ref 8.7–10.3)
Chloride: 104 mmol/L (ref 96–106)
GFR calc Af Amer: 92 mL/min/{1.73_m2} (ref >59)
GFR calc non Af Amer: 79 mL/min/{1.73_m2} (ref >59)
GLOBULIN, TOTAL: 2.4 g/dL (ref 1.5–4.5)
Glucose: 97 mg/dL (ref 65–99)
Potassium: 4.4 mmol/L (ref 3.5–5.2)
SODIUM: 142 mmol/L (ref 134–144)
Total Protein: 6.5 g/dL (ref 6.0–8.5)

## 2018-04-06 LAB — CBC WITH DIFFERENTIAL/PLATELET
Basophils Absolute: 0 10*3/uL (ref 0.0–0.2)
Basos: 0 %
EOS (ABSOLUTE): 0.1 10*3/uL (ref 0.0–0.4)
EOS: 1 %
Hematocrit: 37.5 % (ref 34.0–46.6)
Hemoglobin: 11.9 g/dL (ref 11.1–15.9)
IMMATURE GRANULOCYTES: 0 %
Immature Grans (Abs): 0 10*3/uL (ref 0.0–0.1)
Lymphocytes Absolute: 1 10*3/uL (ref 0.7–3.1)
Lymphs: 22 %
MCH: 27.9 pg (ref 26.6–33.0)
MCHC: 31.7 g/dL (ref 31.5–35.7)
MCV: 88 fL (ref 79–97)
MONOS ABS: 0.3 10*3/uL (ref 0.1–0.9)
Monocytes: 7 %
NEUTROS PCT: 70 %
Neutrophils Absolute: 3 10*3/uL (ref 1.4–7.0)
Platelets: 235 10*3/uL (ref 150–450)
RBC: 4.26 x10E6/uL (ref 3.77–5.28)
RDW: 18.4 % — AB (ref 12.3–15.4)
WBC: 4.4 10*3/uL (ref 3.4–10.8)

## 2018-04-25 NOTE — Progress Notes (Signed)
Office Visit Note  Patient: [name]             Date of Birth: 1951/08/09           MRN: [id number]             PCP: [name], MD Referring: [name], MD Visit Date: 05/09/2018 Occupation: @GUAROCC @  Subjective:  Pain in multiple joints   History of Present Illness: [name] is a 67 y.o. female with history of seropositive rheumatoid arthritis and DDD.  Patient is on PLQ 200 mg BID, MTX 8 tablets by mouth once a week, and folic acid 2 mg daily.  She reports she has not noticed much benefit since starting on PLQ.  She states she had a PLQ eye exam performed in [place].  She denies experiencing any side effects of PLQ.  She states she is having in both shoulders, both wrist joints, and both ankle joints.  She states she has not increased weakness in both hands.  She states she has bilateral ankle joint swelling.  She states bilateral knee replacements are doing well.  She has been exercising in the pool 3 times a week, which has been helping with some of her joint pain.  She continues to have chronic lower back pain.   Activities of Daily Living:  Patient reports morning stiffness for 1.5 hours.   Patient Reports nocturnal pain.  Difficulty dressing/grooming: Denies Difficulty climbing stairs: Denies Difficulty getting out of chair: Denies Difficulty using hands for taps, buttons, cutlery, and/or writing: Reports  Review of Systems  Constitutional: Positive for fatigue.  HENT: Positive for mouth dryness. Negative for mouth sores and nose dryness.   Eyes: Positive for dryness. Negative for pain and visual disturbance.  Respiratory: Negative for cough, hemoptysis, shortness of breath and difficulty breathing.   Cardiovascular: Negative for chest pain, palpitations, hypertension and swelling in legs/feet.  Gastrointestinal: Negative for abdominal pain, blood in stool, constipation, diarrhea, nausea and vomiting.  Endocrine: Negative for increased  urination.  Genitourinary: Negative for painful urination, nocturia and pelvic pain.  Musculoskeletal: Positive for arthralgias, joint pain, joint swelling and morning stiffness. Negative for myalgias, muscle weakness, muscle tenderness and myalgias.  Skin: Negative for color change, pallor, rash, hair loss, nodules/bumps, skin tightness, ulcers and sensitivity to sunlight.  Allergic/Immunologic: Negative for susceptible to infections.  Neurological: Negative for dizziness, light-headedness and numbness.  Hematological: Negative for bruising/bleeding tendency and swollen glands.  Psychiatric/Behavioral: Negative for depressed mood, confusion and sleep disturbance. The patient is not nervous/anxious.     PMFS History:  Patient Active Problem List   Diagnosis Date Noted  . History of MRSA infection 04/04/2017  . High risk medication use 03/02/2017  . Hypercalcemia 03/02/2017  . Pelvic pain in female 08/25/2015  . Hematuria 08/25/2015  . History of adenomatous polyp of colon 10/01/2014  . Seropositive rheumatoid arthritis of multiple sites ([place]) 07/20/2014  . History of juvenile rheumatoid arthritis 07/20/2014  . Impaired fasting glucose 07/20/2014  . NEOPLASM, SKIN, UNCERTAIN BEHAVIOR 08/23/7251  . Unilateral primary osteoarthritis, right knee 09/23/2008  . CALF PAIN, RIGHT 09/23/2008  . ANXIETY DEPRESSION 08/07/2008  . OBESITY 12/19/2007  . DDD (degenerative disc disease), lumbar 06/27/2007  . Osteopenia 06/27/2007  . HIP PAIN, LEFT 06/18/2007  . CONSTIPATION 05/16/2007  . Essential hypertension 04/04/2007  . Psoriasis 03/07/2007  . OSTEOARTHROSIS, GENERALIZED, MULTIPLE SITES 03/07/2007  . ANOSMIA 03/07/2007  . LIPOMA 02/21/2007  . Hyperlipidemia LDL goal <130 02/21/2007  .  ALLERGIC RHINITIS 02/21/2007  . ASTHMA 02/21/2007  . ARTHRITIS 02/21/2007  . KNEE PAIN, LEFT 02/21/2007  . LOW BACK PAIN, CHRONIC 02/21/2007    Past Medical History:  Diagnosis Date  . Allergic rhinitis    . Arthritis   . Asthma   . Hematuria 08/25/2015  . Hyperlipidemia   . Hypertension   . Obesity   . Pelvic pain in female 08/25/2015    Family History  Problem Relation Age of Onset  . Heart attack Father   . Leukemia Mother   . Obesity Sister   . Other Sister        breathing problems  . Hepatitis C Sister   . Arthritis Sister   . Emphysema Brother   . Obesity Daughter   . Polycystic ovary syndrome Daughter   . Obesity Son   . Other Son        knee problems  . Cancer Maternal Grandmother        breast  . Other Brother        aneursym  . Arthritis Sister   . Obesity Son   . Asthma Son   . Obesity Son   . Diabetes Other        runs on dad's side of the family  . Colon cancer Neg Hx        states she doesn't know a lot about her family, but has never heard of specific colon ca   Past Surgical History:  Procedure Laterality Date  . BACK SURGERY    . CHOLECYSTECTOMY    . COLONOSCOPY  2007   sessile sigmoid polyp x 1; path: serrated adenoma  . COLONOSCOPY N/A 10/12/2014   Procedure: COLONOSCOPY;  Surgeon: [name], MD;  Location: [facility];  Service: Endoscopy;  Laterality: N/A;  . JOINT REPLACEMENT     BIL knee   . KNEE ARTHROPLASTY    . KNEE SURGERY     bilateral knee replacement  . LIPOMA EXCISION     x 2  . TONSILLECTOMY AND ADENOIDECTOMY     Social History   Social History Narrative  . Not on file    Objective: Vital Signs: BP (!) 150/75 (BP Location: Left Arm, Patient Position: Sitting, Cuff Size: Large)   Pulse 73   Resp 16   Ht 5\' 5"  (1.651 m)   Wt 266 lb (120.7 kg)   BMI 44.26 kg/m    Physical Exam  Constitutional: She is oriented to person, place, and time. She appears well-developed and well-nourished.  HENT:  Head: Normocephalic and atraumatic.  Eyes: Conjunctivae and EOM are normal.  Neck: Normal range of motion.  Cardiovascular: Normal rate, regular rhythm, normal heart sounds and intact distal pulses.  Pulmonary/Chest: Effort  normal and breath sounds normal.  Abdominal: Soft. Bowel sounds are normal.  Lymphadenopathy:    She has no cervical adenopathy.  Neurological: She is alert and oriented to person, place, and time.  Skin: Skin is warm and dry. Capillary refill takes less than 2 seconds.  Psychiatric: She has a normal mood and affect. Her behavior is normal.  Nursing note and vitals reviewed.    Musculoskeletal Exam: C-spine, thoracic, and lumbar spine good ROM.  Midline spinal tenderness in lumbar region.  No SI joint tenderness.  Shoulder joints, wrist joints, MCPs, PIPs, DIPs good range of motion with no synovitis. Bilateral elbow joint contractures.  She has tenderness of bilateral wrist joints. Synovial thickening of MCPs.  Ulnar deviation noted. Hip joints,  knee joints, ankle joints good range of motion no synovitis.  No warmth or effusion bilateral knee replacements.  She has pedal edema bilaterally.  She has tenderness and warmth of bilateral ankle joints. Overcrowding of toes noted. No tenderness of trochanter bursa bilaterally.  CDAI Exam: CDAI Score: 3  Patient Global Assessment: 5 (mm); Provider Global Assessment: 5 (mm) Swollen: 2 ; Tender: 4  Joint Exam      Right  Left  Wrist   Tender   Tender  Ankle  Swollen Tender  Swollen Tender     Investigation: No additional findings.  Imaging: No results found.  Recent Labs: Lab Results  Component Value Date   WBC 4.4 04/05/2018   HGB 11.9 04/05/2018   PLT 235 04/05/2018   NA 142 04/05/2018   K 4.4 04/05/2018   CL 104 04/05/2018   CO2 23 04/05/2018   GLUCOSE 97 04/05/2018   BUN 11 04/05/2018   CREATININE 0.78 04/05/2018   BILITOT 0.6 04/05/2018   ALKPHOS 91 04/05/2018   AST 20 04/05/2018   ALT 17 04/05/2018   PROT 6.5 04/05/2018   ALBUMIN 4.1 04/05/2018   CALCIUM 9.9 04/05/2018   GFRAA 92 04/05/2018    Speciality Comments: no baseline PLQ eye exam  Procedures:  No procedures performed Allergies: Codeine and Cyclosporine    Assessment / Plan:     Visit Diagnoses: Seropositive rheumatoid arthritis of multiple sites ([place]) - positive RF, positive anti-CCP. Previous patient of Dr. Charlestine Night with multiple contractures: She continues to have discomfort in multiple joints including bilateral wrists bilateral shoulders and bilateral ankle joints.  She has warmth and tenderness of bilateral ankle joints on exam today.  No synovitis of the wrist joints as noted on exam.  She has not noticed much benefit since starting on Plaquenil in April 2019.  She continues to take Plaquenil 200 mg twice daily and methotrexate 8 tablets by mouth once weekly.  She is been out of methotrexate for the past 3 weeks due to not having a refill.  She is apprehensive to try a biologic medication at this time.  We discussed switching her from methotrexate tablets to subcutaneous injections once weekly.  She is in agreement to switch at this time.  All questions were addressed.  We will apply for Rasuvo 20 mg subcutaneous injections.  High risk medication use - PLQ 200 mg po BID, switching to sq once weekly, and folic acid 2 mg dailyd/c Orencia sq 03/21/2017 due to anxiety depression and increased swelling. She had CBC and CMP drawn on 04/05/18.  She will return in November and every 3 months to monitor for drug toxicity.    History of bilateral knee replacement: Doing well.  No warmth or effusion of knee joints.   DDD (degenerative disc disease), lumbar: S/p fusion-She has midline spinal tenderness in lumbar region.    Psoriasis: She has no active psoriasis.   Osteopenia of multiple sites  Other medical conditions are listed as follows:   Essential hypertension  History of hyperlipidemia  History of MRSA infection  Trochanteric bursitis of right hip: Resolved   History of adenomatous polyp of colon  History of anxiety  History of depression   Orders: No orders of the defined types were placed in this encounter.  No orders of the  defined types were placed in this encounter.   Face-to-face time spent with patient was 30 minutes. Greater than 50% of time was spent in counseling and coordination of care.  Follow-Up Instructions: Return  in about 5 months (around 10/09/2018) for Rheumatoid arthritis, DDD.   Ofilia Neas, PA-C   I examined and evaluated the patient with Hazel Sams PA.  Patient continues to have significant arthralgias despite being on Plaquenil and methotrexate.  She has synovial thickening over bilateral hands with ulnar deviation.  She also has overcrowding of toes with synovial thickening.  She had discomfort in her bilateral ankle joints.  We had detailed discussion regarding switching to subcu methotrexate.  She was in agreement.  We will apply for Rasuvo.  If she has an adequate response to Rasuvo then biologic therapy can be considered in future.  The plan of care was discussed as noted above.  [name], MD  Note - This record has been created using Editor, commissioning.  Chart creation errors have been sought, but may not always  have been located. Such creation errors do not reflect on  the standard of medical care.

## 2018-05-09 ENCOUNTER — Encounter: Payer: Self-pay | Admitting: Rheumatology

## 2018-05-09 ENCOUNTER — Ambulatory Visit (INDEPENDENT_AMBULATORY_CARE_PROVIDER_SITE_OTHER): Payer: BLUE CROSS/BLUE SHIELD | Admitting: Rheumatology

## 2018-05-09 ENCOUNTER — Telehealth: Payer: Self-pay | Admitting: Pharmacy Technician

## 2018-05-09 VITALS — BP 150/75 | HR 73 | Resp 16 | Ht 65.0 in | Wt 266.0 lb

## 2018-05-09 DIAGNOSIS — M0579 Rheumatoid arthritis with rheumatoid factor of multiple sites without organ or systems involvement: Secondary | ICD-10-CM | POA: Diagnosis not present

## 2018-05-09 DIAGNOSIS — Z8614 Personal history of Methicillin resistant Staphylococcus aureus infection: Secondary | ICD-10-CM

## 2018-05-09 DIAGNOSIS — Z8639 Personal history of other endocrine, nutritional and metabolic disease: Secondary | ICD-10-CM

## 2018-05-09 DIAGNOSIS — Z79899 Other long term (current) drug therapy: Secondary | ICD-10-CM | POA: Diagnosis not present

## 2018-05-09 DIAGNOSIS — Z96653 Presence of artificial knee joint, bilateral: Secondary | ICD-10-CM

## 2018-05-09 DIAGNOSIS — M8589 Other specified disorders of bone density and structure, multiple sites: Secondary | ICD-10-CM

## 2018-05-09 DIAGNOSIS — Z8659 Personal history of other mental and behavioral disorders: Secondary | ICD-10-CM

## 2018-05-09 DIAGNOSIS — M5136 Other intervertebral disc degeneration, lumbar region: Secondary | ICD-10-CM | POA: Diagnosis not present

## 2018-05-09 DIAGNOSIS — M7061 Trochanteric bursitis, right hip: Secondary | ICD-10-CM

## 2018-05-09 DIAGNOSIS — L409 Psoriasis, unspecified: Secondary | ICD-10-CM

## 2018-05-09 DIAGNOSIS — Z86010 Personal history of colonic polyps: Secondary | ICD-10-CM

## 2018-05-09 DIAGNOSIS — I10 Essential (primary) hypertension: Secondary | ICD-10-CM

## 2018-05-09 NOTE — Telephone Encounter (Signed)
Received a fax from [facility] regarding a prior authorization for Rasuvo 20mg . Authorization has been APPROVED from 05/09/18 to 05/09/20.   Will send document to scan center. Left message for patient to advise.  Authorization # [id number] Phone # [phone]  Please send prescription to [facility] per plan.  3:10 PM Beatriz Chancellor, CPhT

## 2018-05-09 NOTE — Patient Instructions (Signed)
Standing Labs We placed an order today for your standing lab work.    Please come back and get your standing labs in November and every 3 months   We have open lab Monday through Friday from 8:30-11:30 AM and 1:30-4:00 PM  at the office of Dr. Shaili Deveshwar.   You may experience shorter wait times on Monday and Friday afternoons. The office is located at [address], [address], [place], [place] [address] No appointment is necessary.   Labs are drawn by [facility].  You may receive a bill from [facility] for your lab work. If you have any questions regarding directions or hours of operation,  please call [phone].     

## 2018-05-09 NOTE — Telephone Encounter (Signed)
Received a Prior Authorization request from Dr. Estanislado Pandy for Rasuvo 20mg . Authorization has been submitted to patient's insurance via Cover My Meds. Will update once we receive a response.  11:04 AM Beatriz Chancellor, CPhT

## 2018-05-23 MED ORDER — METHOTREXATE (PF) 20 MG/0.4ML ~~LOC~~ SOAJ: 20.0000 mg | SUBCUTANEOUS | 0 refills | Status: DC

## 2018-05-23 NOTE — Telephone Encounter (Addendum)
CBC/CMP within normal limits 04/05/18.  Rasuvo 20 mg prescription sent to [facility].  Deleted oral tablet prescription.

## 2018-05-23 NOTE — Addendum Note (Signed)
Addended by: Mariella Saa C on: 05/23/2018 04:10 PM   Modules accepted: Orders

## 2018-05-23 NOTE — Telephone Encounter (Signed)
Please send Rasuvo prescription to [facility].

## 2018-06-06 ENCOUNTER — Encounter: Payer: Self-pay | Admitting: Family Medicine

## 2018-06-06 ENCOUNTER — Ambulatory Visit: Payer: BLUE CROSS/BLUE SHIELD | Admitting: Family Medicine

## 2018-06-06 VITALS — BP 130/86 | Temp 97.7°F | Ht 65.0 in | Wt 273.6 lb

## 2018-06-06 DIAGNOSIS — M05742 Rheumatoid arthritis with rheumatoid factor of left hand without organ or systems involvement: Secondary | ICD-10-CM

## 2018-06-06 DIAGNOSIS — M05741 Rheumatoid arthritis with rheumatoid factor of right hand without organ or systems involvement: Secondary | ICD-10-CM

## 2018-06-06 DIAGNOSIS — M7541 Impingement syndrome of right shoulder: Secondary | ICD-10-CM | POA: Diagnosis not present

## 2018-06-06 DIAGNOSIS — M25511 Pain in right shoulder: Secondary | ICD-10-CM

## 2018-06-06 MED ORDER — HYDROCODONE-ACETAMINOPHEN 5-325 MG PO TABS: ORAL_TABLET | ORAL | 0 refills | Status: DC

## 2018-06-06 NOTE — Progress Notes (Signed)
   Subjective:    Patient ID: [name], female    DOB: Jun 16, 1951, 67 y.o.   MRN: [id number]  Shoulder Pain   The pain is present in the neck, right shoulder, right arm, right elbow, right wrist, right hand and right fingers. This is a new problem. The current episode started 1 to 4 weeks ago. The quality of the pain is described as pounding. Associated symptoms comments: Pt has limited range of motion. She has tried cold, acetaminophen and oral narcotics for the symptoms. The treatment provided no relief.   incrdible pain in the shouler   Very painfu  Cannot lift arm vry well  Has ben jhurting for severll weeks  Tried the copper glove, might have helped a litlle  Took some prdnsione,had some left over helped a little, bad terible pain  Last nighthtrouble sleeping   Recalls no injury or overdoing it, pt is right handed,    Review of Systems No headache, no major weight loss or weight gain, no chest pain no back pain abdominal pain no change in bowel habits complete ROS otherwise negative     Objective:   Physical Exam  Alert vitals stable, NAD. Blood pressure good on repeat. HEENT normal. Lungs clear. Heart regular rate and rhythm. Hands reveals substantial changes of rheumatoid arthritis.  Shoulder positive impingement sign.  Some joint line tenderness.  No obvious deformity.  Strength intact.  Neck supple no obvious pain with rotation.  No deltoid tenderness.  Procedure note patient was prepped draped injected 1 cc Depo-Medrol 2 cc plain Xylocaine and posterior lateral approach.    Assessment & Plan:  Impression right shoulder pain doubt substantially that it is related to #2.  More likely impingement sign reveals elements of intra-articular inflammation.  Hopefully this would be something like a rotator cuff strain and not a true tear.  Only time will tell.  Discussed with patient.  Multiple modalities discussed patient would like to try injection  2.  Rheumatoid  arthritis.  Ongoing.  With chronic treatment.  I do not think related to #1  Codman's exercises also discussed and educated I think would hold off on possibly physical therapy at this time rationale discussed

## 2018-06-09 MED ORDER — METHYLPREDNISOLONE ACETATE 40 MG/ML IJ SUSP: 40.0000 mg | Freq: Once | INTRAMUSCULAR | Status: DC

## 2018-08-08 ENCOUNTER — Encounter: Payer: Self-pay | Admitting: Family Medicine

## 2018-08-08 ENCOUNTER — Ambulatory Visit: Payer: BLUE CROSS/BLUE SHIELD | Admitting: Family Medicine

## 2018-08-08 VITALS — BP 124/76 | Ht 65.0 in | Wt 276.0 lb

## 2018-08-08 DIAGNOSIS — Z79899 Other long term (current) drug therapy: Secondary | ICD-10-CM

## 2018-08-08 DIAGNOSIS — I10 Essential (primary) hypertension: Secondary | ICD-10-CM

## 2018-08-08 DIAGNOSIS — E785 Hyperlipidemia, unspecified: Secondary | ICD-10-CM | POA: Diagnosis not present

## 2018-08-08 DIAGNOSIS — R7301 Impaired fasting glucose: Secondary | ICD-10-CM

## 2018-08-08 MED ORDER — CHLORZOXAZONE 500 MG PO TABS: 500.0000 mg | ORAL_TABLET | Freq: Three times a day (TID) | ORAL | 0 refills | Status: DC | PRN

## 2018-08-08 MED ORDER — ATORVASTATIN CALCIUM 40 MG PO TABS: ORAL_TABLET | ORAL | 1 refills | Status: DC

## 2018-08-08 MED ORDER — LISINOPRIL 10 MG PO TABS: 10.0000 mg | ORAL_TABLET | Freq: Every day | ORAL | 1 refills | Status: DC

## 2018-08-08 NOTE — Progress Notes (Signed)
Subjective:    Patient ID: [name], female    DOB: 1950-11-17, 67 y.o.   MRN: [id number]  Hyperlipidemia  This is a chronic problem. The current episode started more than 1 year ago. Treatments tried: atorvastatin 65m. There are no compliance problems (takes meds every day, eats healthy, walks around [employer] every day, sometimes goes to the pool, going to try to start silver sneakers exercise program).    Would like refill on chlorzoxazone. Takes when she travels due to having to sit in a car or plane for long period of time.   Blood pressure medicine and blood pressure levels reviewed today with patient. Compliant with blood pressure medicine. States does not miss a dose. No obvious side effects. Blood pressure generally good when checked elsewhere. Watching salt intake.   Took a fall couple weeks ago,  Had pain and osreness, saw the chiripractor and now better   Numbers usually good,  [id number]   Pt gainign weight , reports diet is off and on  Getting silver sneakers staring in January, pt is hoping to partifipate     Plans to work on things    Fl shot alreay given   Results for orders placed or performed in visit on 04/01/18  CMP14+EGFR  Result Value Ref Range   Glucose 97 65 - 99 mg/dL   BUN 11 8 - 27 mg/dL   Creatinine, Ser 0.78 0.57 - 1.00 mg/dL   GFR calc non Af Amer 79 >59 mL/min/1.73   GFR calc Af Amer 92 >59 mL/min/1.73   BUN/Creatinine Ratio 14 12 - 28   Sodium 142 134 - 144 mmol/L   Potassium 4.4 3.5 - 5.2 mmol/L   Chloride 104 96 - 106 mmol/L   CO2 23 20 - 29 mmol/L   Calcium 9.9 8.7 - 10.3 mg/dL   Total Protein 6.5 6.0 - 8.5 g/dL   Albumin 4.1 3.6 - 4.8 g/dL   Globulin, Total 2.4 1.5 - 4.5 g/dL   Albumin/Globulin Ratio 1.7 1.2 - 2.2   Bilirubin Total 0.6 0.0 - 1.2 mg/dL   Alkaline Phosphatase 91 39 - 117 IU/L   AST 20 0 - 40 IU/L   ALT 17 0 - 32 IU/L  CBC with Differential/Platelet  Result Value Ref Range   WBC 4.4 3.4 - 10.8  x10E3/uL   RBC 4.26 3.77 - 5.28 x10E6/uL   Hemoglobin 11.9 11.1 - 15.9 g/dL   Hematocrit 37.5 34.0 - 46.6 %   MCV 88 79 - 97 fL   MCH 27.9 26.6 - 33.0 pg   MCHC 31.7 31.5 - 35.7 g/dL   RDW 18.4 (H) 12.3 - 15.4 %   Platelets 235 150 - 450 x10E3/uL   Neutrophils 70 Not Estab. %   Lymphs 22 Not Estab. %   Monocytes 7 Not Estab. %   Eos 1 Not Estab. %   Basos 0 Not Estab. %   Neutrophils Absolute 3.0 1.4 - 7.0 x10E3/uL   Lymphocytes Absolute 1.0 0.7 - 3.1 x10E3/uL   Monocytes Absolute 0.3 0.1 - 0.9 x10E3/uL   EOS (ABSOLUTE) 0.1 0.0 - 0.4 x10E3/uL   Basophils Absolute 0.0 0.0 - 0.2 x10E3/uL   Immature Granulocytes 0 Not Estab. %   Immature Grans (Abs) 0.0 0.0 - 0.1 x10E3/uL      bp  Usually numbers are go od       Take meds faith fully      Review of Systems No headache, no major  weight loss or weight gain, no chest pain no back pain abdominal pain no change in bowel habits complete ROS otherwise negative     Objective:   Physical Exam   Alert and oriented, vitals reviewed and stable, NAD ENT-TM's and ext canals WNL bilat via otoscopic exam Soft palate, tonsils and post pharynx WNL via oropharyngeal exam Neck-symmetric, no masses; thyroid nonpalpable and nontender Pulmonary-no tachypnea or accessory muscle use; Clear without wheezes via auscultation Card--no abnrml murmurs, rhythm reg and rate WNL Carotid pulses symmetric, without bruits      Assessment & Plan:  Impression 1 hypertension.  Good control discussed maintain same meds.  2.  Hyperlipidemia.  Prior blood work reviewed.  Medications to maintain same dose.  Await blood work results  3.  Morbid obesity.  Discussed.  Patient states will be working more on exercise.  She is hoping she can improve her weight situation at that point  4.  Intermittent back spasms.  With need for medicines.  Chlorzoxazone has worked well in the past.  Will maintain  5.  Impaired fasting glucose/prediabetes weight blood  work  Medications refilled.  Diet exercise discussed.  Follow-up in 6 months.  Further recommendations based on blood work

## 2018-08-09 ENCOUNTER — Other Ambulatory Visit: Payer: Self-pay | Admitting: Family Medicine

## 2018-09-19 NOTE — Progress Notes (Signed)
Office Visit Note  Patient: [name]             Date of Birth: 02-06-1951           MRN: [id number]             PCP: [name], MD Referring: [name], MD Visit Date: 10/03/2018 Occupation: @GUAROCC @  Subjective:  Discuss medications    History of Present Illness: [name] is a 68 y.o. female with history of seropositive rheumatoid arthritis and DDD.  Patient was started on Plaquenil in April 2019 and Rasuvo September 2019, but she discontinued on her own since she did not notice much benefit.  She has been off of pursue since early fall and discontinued Plaquenil November 2019.  She denies any recent rheumatoid arthritis flares.  She states that she is having pain and intermittent joint swelling in her bilateral ankles.  She states that she does have discomfort in her right wrist, right shoulder, and right trochanteric bursa.  She is apprehensive to restart her medications since she did not feel they were effective.  She reports that bilateral knee replacements are doing well.    Activities of Daily Living:  Patient reports morning stiffness for 1 hour.   Patient Reports nocturnal pain.  Difficulty dressing/grooming: Reports Difficulty climbing stairs: Denies Difficulty getting out of chair: Reports Difficulty using hands for taps, buttons, cutlery, and/or writing: Reports  Review of Systems  Constitutional: Positive for fatigue.  HENT: Negative for mouth sores, mouth dryness and nose dryness.   Eyes: Positive for dryness. Negative for pain, itching and visual disturbance.  Respiratory: Negative for cough, hemoptysis, shortness of breath, wheezing and difficulty breathing.   Cardiovascular: Negative for chest pain, palpitations, hypertension and swelling in legs/feet.  Gastrointestinal: Negative for abdominal pain, blood in stool, constipation and diarrhea.  Endocrine: Negative for increased urination.  Genitourinary: Negative for painful  urination, nocturia and pelvic pain.  Musculoskeletal: Positive for arthralgias, joint pain, joint swelling and morning stiffness. Negative for myalgias, muscle weakness, muscle tenderness and myalgias.  Skin: Negative for color change, pallor, rash, hair loss, nodules/bumps, redness, skin tightness, ulcers and sensitivity to sunlight.  Allergic/Immunologic: Negative for susceptible to infections.  Neurological: Positive for headaches. Negative for dizziness, light-headedness, numbness and memory loss.  Hematological: Negative for bruising/bleeding tendency and swollen glands.  Psychiatric/Behavioral: Negative for depressed mood and sleep disturbance. The patient is not nervous/anxious.     PMFS History:  Patient Active Problem List   Diagnosis Date Noted  . History of MRSA infection 04/04/2017  . High risk medication use 03/02/2017  . Hypercalcemia 03/02/2017  . Pelvic pain in female 08/25/2015  . Hematuria 08/25/2015  . History of adenomatous polyp of colon 10/01/2014  . Seropositive rheumatoid arthritis of multiple sites ([place]) 07/20/2014  . History of juvenile rheumatoid arthritis 07/20/2014  . Impaired fasting glucose 07/20/2014  . NEOPLASM, SKIN, UNCERTAIN BEHAVIOR 50/04/3817  . Unilateral primary osteoarthritis, right knee 09/23/2008  . CALF PAIN, RIGHT 09/23/2008  . ANXIETY DEPRESSION 08/07/2008  . OBESITY 12/19/2007  . DDD (degenerative disc disease), lumbar 06/27/2007  . Osteopenia 06/27/2007  . HIP PAIN, LEFT 06/18/2007  . CONSTIPATION 05/16/2007  . Essential hypertension 04/04/2007  . Psoriasis 03/07/2007  . OSTEOARTHROSIS, GENERALIZED, MULTIPLE SITES 03/07/2007  . ANOSMIA 03/07/2007  . LIPOMA 02/21/2007  . Hyperlipidemia LDL goal <130 02/21/2007  . ALLERGIC RHINITIS 02/21/2007  . ASTHMA 02/21/2007  . ARTHRITIS 02/21/2007  . KNEE PAIN, LEFT 02/21/2007  . LOW  BACK PAIN, CHRONIC 02/21/2007    Past Medical History:  Diagnosis Date  . Allergic rhinitis   .  Arthritis   . Asthma   . Hematuria 08/25/2015  . Hyperlipidemia   . Hypertension   . Obesity   . Pelvic pain in female 08/25/2015    Family History  Problem Relation Age of Onset  . Heart attack Father   . Leukemia Mother   . Obesity Sister   . Other Sister        breathing problems  . Hepatitis C Sister   . Arthritis Sister   . Emphysema Brother   . Obesity Daughter   . Polycystic ovary syndrome Daughter   . Obesity Son   . Other Son        knee problems  . Cancer Maternal Grandmother        breast  . Other Brother        aneursym  . Arthritis Sister   . Obesity Son   . Asthma Son   . Obesity Son   . Diabetes Other        runs on dad's side of the family  . Colon cancer Neg Hx        states she doesn't know a lot about her family, but has never heard of specific colon ca   Past Surgical History:  Procedure Laterality Date  . BACK SURGERY    . CHOLECYSTECTOMY    . COLONOSCOPY  2007   sessile sigmoid polyp x 1; path: serrated adenoma  . COLONOSCOPY N/A 10/12/2014   Procedure: COLONOSCOPY;  Surgeon: [name], MD;  Location: [facility];  Service: Endoscopy;  Laterality: N/A;  . JOINT REPLACEMENT     BIL knee   . KNEE ARTHROPLASTY    . KNEE SURGERY     bilateral knee replacement  . LIPOMA EXCISION     x 2  . TONSILLECTOMY AND ADENOIDECTOMY     Social History   Social History Narrative  . Not on file   Immunization History  Administered Date(s) Administered  . Influenza Split 06/17/2013  . Influenza Whole 06/27/2007, 05/08/2008  . Influenza,inj,Quad PF,6+ Mos 07/12/2015  . Influenza-Unspecified 06/01/2016, 06/14/2017, 06/18/2018  . Pneumococcal Polysaccharide-23 02/18/1993, 06/27/2007     Objective: Vital Signs: BP (!) 145/83 (BP Location: Left Wrist, Patient Position: Sitting, Cuff Size: Normal)   Pulse 73   Resp 15   Ht 5\' 4"  (1.626 m)   Wt 267 lb (121.1 kg)   BMI 45.83 kg/m    Physical Exam Vitals signs and nursing note reviewed.    Constitutional:      Appearance: She is well-developed.  HENT:     Head: Normocephalic and atraumatic.  Eyes:     Conjunctiva/sclera: Conjunctivae normal.  Neck:     Musculoskeletal: Normal range of motion.  Cardiovascular:     Rate and Rhythm: Normal rate and regular rhythm.     Heart sounds: Normal heart sounds.  Pulmonary:     Effort: Pulmonary effort is normal.     Breath sounds: Normal breath sounds.  Abdominal:     General: Bowel sounds are normal.     Palpations: Abdomen is soft.  Lymphadenopathy:     Cervical: No cervical adenopathy.  Skin:    General: Skin is warm and dry.     Capillary Refill: Capillary refill takes less than 2 seconds.  Neurological:     Mental Status: She is alert and oriented to person, place, and  time.  Psychiatric:        Behavior: Behavior normal.      Musculoskeletal Exam: C-spine good ROM.  Thoracic kyphosis noted.  Limited ROM of lumbar spine.  Midline spinal tenderness in lumbar region.  Shoulder joints, elbow joints, wrist joints, MCPs, PIPs, and DIPs good ROM with no synovitis.  Synovial thickening of right 1st, 2nd, and 3rd MCPs.  Ulnar deviation bilaterally.  Hip joints good ROM with no discomfort.  Right trochanteric bursitis.  Bilateral knee replacements good ROM with no warmth or effusion.  Pedal edema bilaterally.  Overcrowding of toes.  PIP and DIP synovial thickening.  No tenderness or inflammation of MTPs.   CDAI Exam: CDAI Score: Not documented Patient Global Assessment: Not documented; Provider Global Assessment: Not documented Swollen: Not documented; Tender: Not documented Joint Exam   Not documented   There is currently no information documented on the homunculus. Go to the Rheumatology activity and complete the homunculus joint exam.  Investigation: No additional findings.  Imaging: No results found.  Recent Labs: Lab Results  Component Value Date   WBC 4.4 04/05/2018   HGB 11.9 04/05/2018   PLT 235 04/05/2018    NA 142 04/05/2018   K 4.4 04/05/2018   CL 104 04/05/2018   CO2 23 04/05/2018   GLUCOSE 97 04/05/2018   BUN 11 04/05/2018   CREATININE 0.78 04/05/2018   BILITOT 0.6 04/05/2018   ALKPHOS 91 04/05/2018   AST 20 04/05/2018   ALT 17 04/05/2018   PROT 6.5 04/05/2018   ALBUMIN 4.1 04/05/2018   CALCIUM 9.9 04/05/2018   GFRAA 92 04/05/2018    Speciality Comments: no baseline PLQ eye exam  Procedures:  No procedures performed Allergies: Codeine and Cyclosporine   Assessment / Plan:     Visit Diagnoses: Seropositive rheumatoid arthritis of multiple sites ([place]) - positive RF, positive anti-CCP. Previous patient of Dr. Charlestine Night with multiple contractures: She has no synovitis on exam.  She has not had any recent rheumatoid arthritis flares.  She has been off of Rasuvo since September 2019 and off of Plaquenil since November 2019.  She discontinued these medications on her own due to not noticing much benefit while taking them.  She is apprehensive to restart. She continues to have pain in the right wrist, right shoulder, and bilateral ankle joints.  She has no synovitis or tenderness on exam. We discussed scheduling ultrasound of bilateral hands to assess for synovitis.  If she does not have active disease and appears to be in remission we will just continue to monitor every 6 months.  She was advised to notify us if she develops increased joint pain or joint swelling.  She will follow-up in the office in 6 months.  High risk medication use -She has been off of Rasuvo 20 mg since September and PLQ since November 2019.Last Plaquenil eye exam unknown but patient states she had one done in [place].  Most recent CBC/CMP stable on 04/05/2018.d/c Orencia sq 03/21/2017 due to anxiety depression and increased swelling.   History of bilateral knee replacement: Doing well.  No warmth or effusion.  She is good range of motion with no discomfort.  DDD (degenerative disc disease), lumbar - s/p fusion: She  has limited range of motion.  She has midline spinal tenderness in lumbar region.  She is intermittent lower back pain.  Trochanteric bursitis of right hip: She has tenderness over the right trochanteric bursa.  She has good range of motion of bilateral hip joints.  She  was given a handout of exercises to perform at home.   Other medical conditions are listed as follows:  Osteopenia of multiple sites  Essential hypertension  History of hyperlipidemia  History of MRSA infection  History of adenomatous polyp of colon  History of anxiety  History of depression  History of asthma   Orders: No orders of the defined types were placed in this encounter.  No orders of the defined types were placed in this encounter.    Follow-Up Instructions: Return in about 6 months (around 04/03/2019) for Rheumatoid arthritis, DDD.   Ofilia Neas, PA-C  Note - This record has been created using Dragon software.  Chart creation errors have been sought, but may not always  have been located. Such creation errors do not reflect on  the standard of medical care.

## 2018-10-03 ENCOUNTER — Encounter: Payer: Self-pay | Admitting: Physician Assistant

## 2018-10-03 ENCOUNTER — Ambulatory Visit: Payer: BLUE CROSS/BLUE SHIELD | Admitting: Physician Assistant

## 2018-10-03 VITALS — BP 145/83 | HR 73 | Resp 15 | Ht 64.0 in | Wt 267.0 lb

## 2018-10-03 DIAGNOSIS — I10 Essential (primary) hypertension: Secondary | ICD-10-CM | POA: Diagnosis not present

## 2018-10-03 DIAGNOSIS — Z8639 Personal history of other endocrine, nutritional and metabolic disease: Secondary | ICD-10-CM

## 2018-10-03 DIAGNOSIS — M8589 Other specified disorders of bone density and structure, multiple sites: Secondary | ICD-10-CM

## 2018-10-03 DIAGNOSIS — M51369 Other intervertebral disc degeneration, lumbar region without mention of lumbar back pain or lower extremity pain: Secondary | ICD-10-CM

## 2018-10-03 DIAGNOSIS — Z8709 Personal history of other diseases of the respiratory system: Secondary | ICD-10-CM | POA: Diagnosis not present

## 2018-10-03 DIAGNOSIS — Z8659 Personal history of other mental and behavioral disorders: Secondary | ICD-10-CM | POA: Diagnosis not present

## 2018-10-03 DIAGNOSIS — Z860101 Personal history of adenomatous and serrated colon polyps: Secondary | ICD-10-CM

## 2018-10-03 DIAGNOSIS — Z79899 Other long term (current) drug therapy: Secondary | ICD-10-CM | POA: Diagnosis not present

## 2018-10-03 DIAGNOSIS — Z8614 Personal history of Methicillin resistant Staphylococcus aureus infection: Secondary | ICD-10-CM

## 2018-10-03 DIAGNOSIS — M0579 Rheumatoid arthritis with rheumatoid factor of multiple sites without organ or systems involvement: Secondary | ICD-10-CM | POA: Diagnosis not present

## 2018-10-03 DIAGNOSIS — M7061 Trochanteric bursitis, right hip: Secondary | ICD-10-CM

## 2018-10-03 DIAGNOSIS — Z96653 Presence of artificial knee joint, bilateral: Secondary | ICD-10-CM | POA: Diagnosis not present

## 2018-10-03 DIAGNOSIS — M5136 Other intervertebral disc degeneration, lumbar region: Secondary | ICD-10-CM | POA: Diagnosis not present

## 2018-10-03 DIAGNOSIS — Z86010 Personal history of colonic polyps: Secondary | ICD-10-CM

## 2018-10-03 NOTE — Patient Instructions (Signed)

## 2018-10-17 ENCOUNTER — Encounter (INDEPENDENT_AMBULATORY_CARE_PROVIDER_SITE_OTHER): Payer: PPO

## 2018-10-30 ENCOUNTER — Other Ambulatory Visit: Payer: Self-pay

## 2018-10-30 ENCOUNTER — Ambulatory Visit: Payer: PPO | Admitting: Rheumatology

## 2018-10-30 ENCOUNTER — Telehealth: Payer: Self-pay | Admitting: Pharmacist

## 2018-10-30 ENCOUNTER — Encounter: Payer: Self-pay | Admitting: Rheumatology

## 2018-10-30 ENCOUNTER — Ambulatory Visit (INDEPENDENT_AMBULATORY_CARE_PROVIDER_SITE_OTHER): Payer: Self-pay

## 2018-10-30 VITALS — BP 118/75 | HR 83 | Resp 16 | Ht 64.0 in | Wt 271.8 lb

## 2018-10-30 DIAGNOSIS — Z96653 Presence of artificial knee joint, bilateral: Secondary | ICD-10-CM | POA: Diagnosis not present

## 2018-10-30 DIAGNOSIS — Z8639 Personal history of other endocrine, nutritional and metabolic disease: Secondary | ICD-10-CM | POA: Diagnosis not present

## 2018-10-30 DIAGNOSIS — M65841 Other synovitis and tenosynovitis, right hand: Secondary | ICD-10-CM | POA: Diagnosis not present

## 2018-10-30 DIAGNOSIS — M79641 Pain in right hand: Secondary | ICD-10-CM | POA: Diagnosis not present

## 2018-10-30 DIAGNOSIS — M0579 Rheumatoid arthritis with rheumatoid factor of multiple sites without organ or systems involvement: Secondary | ICD-10-CM

## 2018-10-30 DIAGNOSIS — I10 Essential (primary) hypertension: Secondary | ICD-10-CM

## 2018-10-30 DIAGNOSIS — Z8614 Personal history of Methicillin resistant Staphylococcus aureus infection: Secondary | ICD-10-CM

## 2018-10-30 DIAGNOSIS — M06041 Rheumatoid arthritis without rheumatoid factor, right hand: Secondary | ICD-10-CM | POA: Diagnosis not present

## 2018-10-30 DIAGNOSIS — Z79899 Other long term (current) drug therapy: Secondary | ICD-10-CM

## 2018-10-30 DIAGNOSIS — M65842 Other synovitis and tenosynovitis, left hand: Secondary | ICD-10-CM | POA: Diagnosis not present

## 2018-10-30 DIAGNOSIS — M79642 Pain in left hand: Secondary | ICD-10-CM

## 2018-10-30 DIAGNOSIS — Z8659 Personal history of other mental and behavioral disorders: Secondary | ICD-10-CM | POA: Diagnosis not present

## 2018-10-30 DIAGNOSIS — M5136 Other intervertebral disc degeneration, lumbar region: Secondary | ICD-10-CM

## 2018-10-30 DIAGNOSIS — Z8709 Personal history of other diseases of the respiratory system: Secondary | ICD-10-CM | POA: Diagnosis not present

## 2018-10-30 DIAGNOSIS — M06042 Rheumatoid arthritis without rheumatoid factor, left hand: Secondary | ICD-10-CM | POA: Diagnosis not present

## 2018-10-30 NOTE — Patient Instructions (Signed)
Standing Labs We placed an order today for your standing lab work.    Please come back and get your standing labs in 1 month,  3 months, and then every 5 months.  We have open lab Monday through Friday from 8:30-11:30 AM and 1:30-4:00 PM  at the office of Dr. [name].   You may experience shorter wait times on Monday and Friday afternoons. The office is located at [address], [place], [place], [place] [address] No appointment is necessary.   Labs are drawn by [facility].  You may receive a bill from [facility] for your lab work.  If you wish to have your labs drawn at another location, please call the office 24 hours in advance to send orders.  If you have any questions regarding directions or hours of operation,  please call [phone].   Just as a reminder please drink plenty of water prior to coming for your lab work. Thanks!  Vaccines You are taking a medication(s) that can suppress your immune system.  The following immunizations are recommended: . Flu annually . Pneumonia (Pneumovax 23 and Prevnar 13 spaced at least 1 year apart) . Shingrix  Please check with your PCP to make sure you are up to date.  Hydroxychloroquine tablets What is this medicine? HYDROXYCHLOROQUINE (hye drox ee KLOR oh kwin) is used to treat rheumatoid arthritis and systemic lupus erythematosus. It is also used to treat malaria. This medicine may be used for other purposes; ask your health care provider or pharmacist if you have questions. COMMON BRAND NAME(S): Plaquenil, Quineprox What should I tell my health care provider before I take this medicine? They need to know if you have any of these conditions: -diabetes -eye disease, vision problems -G6PD deficiency -history of blood diseases -history of irregular heartbeat -if you often drink alcohol -kidney disease -liver disease -porphyria -psoriasis -seizures -an unusual or allergic reaction to chloroquine, hydroxychloroquine, other  medicines, foods, dyes, or preservatives -pregnant or trying to get pregnant -breast-feeding How should I use this medicine? Take this medicine by mouth with a glass of water. Follow the directions on the prescription label. Avoid taking antacids within 4 hours of taking this medicine. It is best to separate these medicines by at least 4 hours. Do not cut, crush or chew this medicine. You can take it with or without food. If it upsets your stomach, take it with food. Take your medicine at regular intervals. Do not take your medicine more often than directed. Take all of your medicine as directed even if you think you are better. Do not skip doses or stop your medicine early. Talk to your pediatrician regarding the use of this medicine in children. While this drug may be prescribed for selected conditions, precautions do apply. Overdosage: If you think you have taken too much of this medicine contact a poison control center or emergency room at once. NOTE: This medicine is only for you. Do not share this medicine with others. What if I miss a dose? If you miss a dose, take it as soon as you can. If it is almost time for your next dose, take only that dose. Do not take double or extra doses. What may interact with this medicine? Do not take this medicine with any of the following medications: -cisapride -dofetilide -dronedarone -live virus vaccines -penicillamine -pimozide -thioridazine -ziprasidone This medicine may also interact with the following medications: -ampicillin -antacids -cimetidine -cyclosporine -digoxin -medicines for diabetes, like insulin, glipizide, glyburide -medicines for seizures like  carbamazepine, phenobarbital, phenytoin -mefloquine -methotrexate -other medicines that prolong the QT interval (cause an abnormal heart rhythm) -praziquantel This list may not describe all possible interactions. Give your health care provider a list of all the medicines, herbs,  non-prescription drugs, or dietary supplements you use. Also tell them if you smoke, drink alcohol, or use illegal drugs. Some items may interact with your medicine. What should I watch for while using this medicine? Tell your doctor or healthcare professional if your symptoms do not start to get better or if they get worse. Avoid taking antacids within 4 hours of taking this medicine. It is best to separate these medicines by at least 4 hours. Tell your doctor or health care professional right away if you have any change in your eyesight. Your vision and blood may be tested before and during use of this medicine. This medicine can make you more sensitive to the sun. Keep out of the sun. If you cannot avoid being in the sun, wear protective clothing and use sunscreen. Do not use sun lamps or tanning beds/booths. What side effects may I notice from receiving this medicine? Side effects that you should report to your doctor or health care professional as soon as possible: -allergic reactions like skin rash, itching or hives, swelling of the face, lips, or tongue -changes in vision -decreased hearing or ringing of the ears -redness, blistering, peeling or loosening of the skin, including inside the mouth -seizures -sensitivity to light -signs and symptoms of a dangerous change in heartbeat or heart rhythm like chest pain; dizziness; fast or irregular heartbeat; palpitations; feeling faint or lightheaded, falls; breathing problems -signs and symptoms of liver injury like dark yellow or brown urine; general ill feeling or flu-like symptoms; light-colored stools; loss of appetite; nausea; right upper belly pain; unusually weak or tired; yellowing of the eyes or skin -signs and symptoms of low blood sugar such as feeling anxious; confusion; dizziness; increased hunger; unusually weak or tired; sweating; shakiness; cold; irritable; headache; blurred vision; fast heartbeat; loss of  consciousness -uncontrollable head, mouth, neck, arm, or leg movements Side effects that usually do not require medical attention (report to your doctor or health care professional if they continue or are bothersome): -anxious -diarrhea -dizziness -hair loss -headache -irritable -loss of appetite -nausea, vomiting -stomach pain This list may not describe all possible side effects. Call your doctor for medical advice about side effects. You may report side effects to FDA at 1-800-FDA-1088. Where should I keep my medicine? Keep out of the reach of children. In children, this medicine can cause overdose with small doses. Store at room temperature between 15 and 30 degrees C (59 and 86 degrees F). Protect from moisture and light. Throw away any unused medicine after the expiration date. NOTE: This sheet is a summary. It may not cover all possible information. If you have questions about this medicine, talk to your doctor, pharmacist, or health care provider.  2019 Elsevier/Gold Standard (2016-03-22 14:16:15)

## 2018-10-30 NOTE — Telephone Encounter (Signed)
Dose will be Plaquenil 200 mg twice daily.  Prescription pending lab results.

## 2018-10-30 NOTE — Progress Notes (Signed)
Pharmacy Note  Subjective: Patient presents today to the [facility] to see Dr. Estanislado Pandy.  Patient seen by the pharmacist for counseling on hydroxychloroquine rheumatoid arthritis.  She has been on both Plaquenil and Rasuvo in the past but patient stopped taking as she did not notice any benefit.   Objective: CMP     Component Value Date/Time   NA 142 04/05/2018 1119   K 4.4 04/05/2018 1119   CL 104 04/05/2018 1119   CO2 23 04/05/2018 1119   GLUCOSE 97 04/05/2018 1119   GLUCOSE 94 05/01/2017 1219   BUN 11 04/05/2018 1119   CREATININE 0.78 04/05/2018 1119   CREATININE 0.78 05/01/2017 1219   CALCIUM 9.9 04/05/2018 1119   PROT 6.5 04/05/2018 1119   ALBUMIN 4.1 04/05/2018 1119   AST 20 04/05/2018 1119   ALT 17 04/05/2018 1119   ALKPHOS 91 04/05/2018 1119   BILITOT 0.6 04/05/2018 1119   GFRNONAA 79 04/05/2018 1119   GFRNONAA 80 05/01/2017 1219   GFRAA 92 04/05/2018 1119   GFRAA 92 05/01/2017 1219    CBC    Component Value Date/Time   WBC 4.4 04/05/2018 1119   WBC 5.4 05/01/2017 1219   RBC 4.26 04/05/2018 1119   RBC 4.21 05/01/2017 1219   HGB 11.9 04/05/2018 1119   HCT 37.5 04/05/2018 1119   PLT 235 04/05/2018 1119   MCV 88 04/05/2018 1119   MCH 27.9 04/05/2018 1119   MCH 28.0 05/01/2017 1219   MCHC 31.7 04/05/2018 1119   MCHC 32.4 05/01/2017 1219   RDW 18.4 (H) 04/05/2018 1119   LYMPHSABS 1.0 04/05/2018 1119   MONOABS 924 03/06/2017 0923   EOSABS 0.1 04/05/2018 1119   BASOSABS 0.0 04/05/2018 1119   Immunization History  Administered Date(s) Administered  . Influenza Split 06/17/2013  . Influenza Whole 06/27/2007, 05/08/2008  . Influenza, High Dose Seasonal PF 06/14/2017, 06/18/2018  . Influenza,inj,Quad PF,6+ Mos 07/12/2015  . Influenza-Unspecified 06/01/2016, 06/14/2017, 06/18/2018  . Pneumococcal Polysaccharide-23 02/18/1993, 06/27/2007  . Zoster Recombinat (Shingrix) 08/19/2018    Assessment/Plan: Patient was counseled on the purpose,  proper use, and adverse effects of hydroxychloroquine including nausea/diarrhea, skin rash, headaches, and sun sensitivity.  Discussed importance of annual eye exams while on hydroxychloroquine to monitor to ocular toxicity and discussed importance of frequent laboratory monitoring.  Provided patient with eye exam form for baseline ophthalmologic exam and standing lab instructions.  Provided patient with educational materials on hydroxychloroquine and answered all questions.  Patient consented to hydroxychloroquine.  Will upload consent in the media tab.    Dose will be Plaquenil 200 mg twice daily.  Prescription pending lab results.  All questions encouraged and answered.  Instructed patient to call with any further questions or concerns.  Mariella Saa, PharmD, [facility] Rheumatology Clinical Pharmacist  10/30/2018 2:10 PM

## 2018-10-30 NOTE — Progress Notes (Signed)
Office Visit Note  Patient: [name]             Date of Birth: 1951-01-28           MRN: [id number]             PCP: [name], MD Referring: [name], MD Visit Date: 10/30/2018 Occupation: @GUAROCC @  Subjective:  Pain in right shoulder and stiffness   History of Present Illness: [name] is a 68 y.o. female with history of seropositive rheumatoid arthritis.  She was treated in the past by Plaquenil and methotrexate.  She has been off methotrexate since September 2019.  She states her joints overall have been doing well.  She continues to have discomfort in her right shoulder and lower back.  She has longstanding history of disc disease of lumbar spine status post fusion.  She has significant stiffness in the morning.  She denies any joint swelling.  Activities of Daily Living:  Patient reports morning stiffness for 1.5 hours.   Patient Denies nocturnal pain.  Difficulty dressing/grooming: Denies Difficulty climbing stairs: Reports Difficulty getting out of chair: Reports Difficulty using hands for taps, buttons, cutlery, and/or writing: Reports  Review of Systems  Constitutional: Positive for fatigue. Negative for night sweats, weight gain and weight loss.  HENT: Negative for mouth sores, trouble swallowing, trouble swallowing, mouth dryness and nose dryness.   Eyes: Positive for dryness. Negative for pain, redness and visual disturbance.  Respiratory: Negative for cough, shortness of breath and difficulty breathing.   Cardiovascular: Positive for swelling in legs/feet. Negative for chest pain, palpitations, hypertension and irregular heartbeat.  Gastrointestinal: Negative for blood in stool, constipation and diarrhea.  Endocrine: Negative for increased urination.  Genitourinary: Negative for vaginal dryness.  Musculoskeletal: Positive for arthralgias, joint pain and morning stiffness. Negative for joint swelling, myalgias, muscle weakness, muscle  tenderness and myalgias.  Skin: Negative for color change, rash, hair loss, skin tightness, ulcers and sensitivity to sunlight.  Allergic/Immunologic: Negative for susceptible to infections.  Neurological: Negative for dizziness, memory loss, night sweats and weakness.  Hematological: Negative for swollen glands.  Psychiatric/Behavioral: Positive for depressed mood and sleep disturbance. The patient is nervous/anxious.     PMFS History:  Patient Active Problem List   Diagnosis Date Noted   History of total knee replacement, bilateral 10/30/2018   History of MRSA infection 04/04/2017   High risk medication use 03/02/2017   Hypercalcemia 03/02/2017   Pelvic pain in female 08/25/2015   Hematuria 08/25/2015   History of adenomatous polyp of colon 10/01/2014   Seropositive rheumatoid arthritis of multiple sites ([place]) 07/20/2014   History of juvenile rheumatoid arthritis 07/20/2014   Impaired fasting glucose 07/20/2014   NEOPLASM, SKIN, UNCERTAIN BEHAVIOR 40/98/1191   Unilateral primary osteoarthritis, right knee 09/23/2008   CALF PAIN, RIGHT 09/23/2008   ANXIETY DEPRESSION 08/07/2008   OBESITY 12/19/2007   DDD (degenerative disc disease), lumbar 06/27/2007   Osteopenia 06/27/2007   HIP PAIN, LEFT 06/18/2007   CONSTIPATION 05/16/2007   Essential hypertension 04/04/2007   Psoriasis 03/07/2007   OSTEOARTHROSIS, GENERALIZED, MULTIPLE SITES 03/07/2007   ANOSMIA 03/07/2007   LIPOMA 02/21/2007   Hyperlipidemia LDL goal <130 02/21/2007   ALLERGIC RHINITIS 02/21/2007   ASTHMA 02/21/2007   ARTHRITIS 02/21/2007   KNEE PAIN, LEFT 02/21/2007   LOW BACK PAIN, CHRONIC 02/21/2007    Past Medical History:  Diagnosis Date   Allergic rhinitis    Arthritis    Asthma    Hematuria 08/25/2015  Hyperlipidemia    Hypertension    Obesity    Pelvic pain in female 08/25/2015    Family History  Problem Relation Age of Onset   Heart attack Father    Leukemia  Mother    Obesity Sister    Other Sister        breathing problems   Hepatitis C Sister    Arthritis Sister    Emphysema Brother    Obesity Daughter    Polycystic ovary syndrome Daughter    Obesity Son    Other Son        knee problems   Cancer Maternal Grandmother        breast   Other Brother        aneursym   Arthritis Sister    Obesity Son    Asthma Son    Obesity Son    Diabetes Other        runs on dad's side of the family   Colon cancer Neg Hx        states she doesn't know a lot about her family, but has never heard of specific colon ca   Past Surgical History:  Procedure Laterality Date   BACK SURGERY     CHOLECYSTECTOMY     COLONOSCOPY  2007   sessile sigmoid polyp x 1; path: serrated adenoma   COLONOSCOPY N/A 10/12/2014   Procedure: COLONOSCOPY;  Surgeon: [name], MD;  Location: [facility];  Service: Endoscopy;  Laterality: N/A;   JOINT REPLACEMENT     BIL knee    KNEE ARTHROPLASTY     KNEE SURGERY     bilateral knee replacement   LIPOMA EXCISION     x 2   TONSILLECTOMY AND ADENOIDECTOMY     Social History   Social History Narrative   Not on file   Immunization History  Administered Date(s) Administered   Influenza Split 06/17/2013   Influenza Whole 06/27/2007, 05/08/2008   Influenza, High Dose Seasonal PF 06/14/2017, 06/18/2018   Influenza,inj,Quad PF,6+ Mos 07/12/2015   Influenza-Unspecified 06/01/2016, 06/14/2017, 06/18/2018   Pneumococcal Polysaccharide-23 02/18/1993, 06/27/2007   Zoster Recombinat (Shingrix) 08/19/2018     Objective: Vital Signs: BP 118/75 (BP Location: Left Arm, Patient Position: Sitting, Cuff Size: Normal)    Pulse 83    Resp 16    Ht 5\' 4"  (1.626 m)    Wt 271 lb 12.8 oz (123.3 kg)    BMI 46.65 kg/m    Physical Exam Vitals signs and nursing note reviewed.  Constitutional:      Appearance: She is well-developed.  HENT:     Head: Normocephalic and atraumatic.  Eyes:      Conjunctiva/sclera: Conjunctivae normal.  Neck:     Musculoskeletal: Normal range of motion.  Cardiovascular:     Rate and Rhythm: Normal rate and regular rhythm.     Heart sounds: Normal heart sounds.  Pulmonary:     Effort: Pulmonary effort is normal.     Breath sounds: Normal breath sounds.  Abdominal:     General: Bowel sounds are normal.     Palpations: Abdomen is soft.  Lymphadenopathy:     Cervical: No cervical adenopathy.  Skin:    General: Skin is warm and dry.     Capillary Refill: Capillary refill takes less than 2 seconds.  Neurological:     Mental Status: She is alert and oriented to person, place, and time.  Psychiatric:        Behavior: Behavior  normal.      Musculoskeletal Exam: C-spine good range of motion.  She has limited painful range of motion of her lumbar spine.  Shoulder joints were in good range of motion with discomfort in her right shoulder.  Elbow joints with good range of motion.  She is good range of motion of her wrist joints.  She has synovial thickening over bilateral second and third MCP joint with ulnar deviation.  Hip joints were in good range of motion.  Bilateral knee joints are replaced without discomfort.  She has overcrowding of toes with synovial thickening over MTP joints.  CDAI Exam: CDAI Score: 1.6  Patient Global Assessment: 3 (mm); Provider Global Assessment: 3 (mm) Swollen: 0 ; Tender: 2  Joint Exam      Right  Left  Glenohumeral   Tender     Lumbar Spine   Tender        Investigation: No additional findings.  Imaging: [place] Extrem Up Bilat Comp  Result Date: 10/30/2018 Ultrasound examination of bilateral hands was performed per EULAR recommendations. Using 12 MHz transducer, grayscale and power Doppler bilateral second, third, and fifth MCP joints and bilateral wrist joints both dorsal and volar aspects were evaluated to look for synovitis or tenosynovitis. The findings were there was mild synovitis in right third MCP joint and  bilateral wrist joints.  Bilateral second and third MCP joint narrowing and synovial thickening was noted.  On ultrasound examination. Right median nerve was 0.10 cm squares which was within normal limits and left median nerve was 0.13 cm squares which was more than upper limits of normal. Impression: Ultrasound examination was consistent with rheumatoid arthritis.  She has bilateral mild synovitis.  Left median nerve was mildly enlarged.   Recent Labs: Lab Results  Component Value Date   WBC 4.4 04/05/2018   HGB 11.9 04/05/2018   PLT 235 04/05/2018   NA 142 04/05/2018   K 4.4 04/05/2018   CL 104 04/05/2018   CO2 23 04/05/2018   GLUCOSE 97 04/05/2018   BUN 11 04/05/2018   CREATININE 0.78 04/05/2018   BILITOT 0.6 04/05/2018   ALKPHOS 91 04/05/2018   AST 20 04/05/2018   ALT 17 04/05/2018   PROT 6.5 04/05/2018   ALBUMIN 4.1 04/05/2018   CALCIUM 9.9 04/05/2018   GFRAA 92 04/05/2018    Speciality Comments: no baseline PLQ eye exam  Procedures:  No procedures performed Allergies: Codeine and Cyclosporine   Assessment / Plan:     Visit Diagnoses: Pain in both hands -patient denies any swelling although she continues to have a stiffness in her hands.  She has synovial thickening and ulnar deviation.  Plan: [place] Extrem Up Bilat Comp.  The ultrasound examination today showed mild synovitis.  Seropositive rheumatoid arthritis of multiple sites [facility]) -patient has been treated with the methotrexate and Plaquenil in the past by Dr. Charlestine Night.  She has been off Plaquenil since September 2019.  She continues to have significant morning stiffness right shoulder pain and some stiffness in her hands.  After ultrasound examination I discussed different treatment options with her.  As currently she does not have much synovitis Plaquenil may be adequate for controlling her symptoms.  After indications side effects contraindications were discussed we decided to place her on Plaquenil 200 mg p.o. twice daily.   I will obtain baseline CBC and CMP today.  She has been also advised to get eye examination.  After starting the medication she will need labs in a month and then  every 3 months to monitor for drug toxicity.  Plan: hydroxychloroquine (PLAQUENIL) 200 MG tablet.  Patient will need baseline eye examination and then yearly eye exam.  High risk medication use - Plan: COMPLETE METABOLIC PANEL WITH GFR, CBC with Differential/Platelet  History of total knee replacement, bilateral-doing well  DDD (degenerative disc disease), lumbar - Status post fusion.  She has ongoing discomfort.  Other medical problems are listed as follows:  History of hyperlipidemia  Essential hypertension-blood pressure is well controlled.  History of MRSA infection  History of asthma  History of depression  History of anxiety   Orders: Orders Placed This Encounter  Procedures   [place] Extrem Up Bilat Comp   COMPLETE METABOLIC PANEL WITH GFR   CBC with Differential/Platelet   No orders of the defined types were placed in this encounter.   Face-to-face time spent with patient was 30 minutes. Greater than 50% of time was spent in counseling and coordination of care.  Follow-Up Instructions: Return in about 3 months (around 01/30/2019) for Rheumatoid arthritis.   [name], MD  Note - This record has been created using Editor, commissioning.  Chart creation errors have been sought, but may not always  have been located. Such creation errors do not reflect on  the standard of medical care.

## 2018-10-31 ENCOUNTER — Ambulatory Visit (INDEPENDENT_AMBULATORY_CARE_PROVIDER_SITE_OTHER): Payer: PPO | Admitting: Family Medicine

## 2018-10-31 ENCOUNTER — Encounter (INDEPENDENT_AMBULATORY_CARE_PROVIDER_SITE_OTHER): Payer: Self-pay | Admitting: Family Medicine

## 2018-10-31 VITALS — BP 139/80 | HR 74 | Temp 97.5°F | Ht 63.0 in | Wt 267.0 lb

## 2018-10-31 DIAGNOSIS — R5383 Other fatigue: Secondary | ICD-10-CM

## 2018-10-31 DIAGNOSIS — R0602 Shortness of breath: Secondary | ICD-10-CM

## 2018-10-31 DIAGNOSIS — E66813 Obesity, class 3: Secondary | ICD-10-CM

## 2018-10-31 DIAGNOSIS — R7303 Prediabetes: Secondary | ICD-10-CM

## 2018-10-31 DIAGNOSIS — I10 Essential (primary) hypertension: Secondary | ICD-10-CM | POA: Diagnosis not present

## 2018-10-31 DIAGNOSIS — Z6841 Body Mass Index (BMI) 40.0 and over, adult: Secondary | ICD-10-CM

## 2018-10-31 DIAGNOSIS — Z0289 Encounter for other administrative examinations: Secondary | ICD-10-CM

## 2018-10-31 DIAGNOSIS — F418 Other specified anxiety disorders: Secondary | ICD-10-CM

## 2018-10-31 LAB — COMPLETE METABOLIC PANEL WITH GFR
AG RATIO: 1.5 (calc) (ref 1.0–2.5)
ALBUMIN MSPROF: 4.2 g/dL (ref 3.6–5.1)
ALT: 12 U/L (ref 6–29)
AST: 19 U/L (ref 10–35)
Alkaline phosphatase (APISO): 74 U/L (ref 37–153)
BUN: 15 mg/dL (ref 7–25)
CO2: 26 mmol/L (ref 20–32)
Calcium: 10.6 mg/dL — ABNORMAL HIGH (ref 8.6–10.4)
Chloride: 104 mmol/L (ref 98–110)
Creat: 0.95 mg/dL (ref 0.50–0.99)
GFR, EST NON AFRICAN AMERICAN: 62 mL/min/{1.73_m2} (ref >=60)
GFR, Est African American: 72 mL/min/{1.73_m2} (ref >=60)
Globulin: 2.8 g/dL (calc) (ref 1.9–3.7)
Glucose, Bld: 89 mg/dL (ref 65–99)
Potassium: 4.6 mmol/L (ref 3.5–5.3)
Sodium: 139 mmol/L (ref 135–146)
Total Bilirubin: 0.6 mg/dL (ref 0.2–1.2)
Total Protein: 7 g/dL (ref 6.1–8.1)

## 2018-10-31 LAB — CBC WITH DIFFERENTIAL/PLATELET
ABSOLUTE MONOCYTES: 570 {cells}/uL (ref 200–950)
Basophils Absolute: 30 cells/uL (ref 0–200)
Basophils Relative: 0.5 %
Eosinophils Absolute: 108 cells/uL (ref 15–500)
Eosinophils Relative: 1.8 %
HEMATOCRIT: 38.9 % (ref 35.0–45.0)
Hemoglobin: 12.7 g/dL (ref 11.7–15.5)
Lymphs Abs: 1704 cells/uL (ref 850–3900)
MCH: 27.3 pg (ref 27.0–33.0)
MCHC: 32.6 g/dL (ref 32.0–36.0)
MCV: 83.7 fL (ref 80.0–100.0)
MPV: 9.9 fL (ref 7.5–12.5)
Monocytes Relative: 9.5 %
Neutro Abs: 3588 cells/uL (ref 1500–7800)
Neutrophils Relative %: 59.8 %
Platelets: 227 10*3/uL (ref 140–400)
RBC: 4.65 10*6/uL (ref 3.80–5.10)
RDW: 14.9 % (ref 11.0–15.0)
Total Lymphocyte: 28.4 %
WBC: 6 10*3/uL (ref 3.8–10.8)

## 2018-10-31 NOTE — Progress Notes (Signed)
.  Office: [phone]  /  Fax: [phone]   HPI:   Chief Complaint: OBESITY  [name] (MR# [id number]) is a 68 y.o. female who presents on 10/31/2018 for obesity evaluation and treatment. Current BMI is Body mass index is 47.3 kg/m.[name] [name] has struggled with obesity for years and has been unsuccessful in either losing weight or maintaining long term weight loss. [name] attended our information session and states she is currently in the action stage of change and ready to dedicate time achieving and maintaining a healthier weight.   [name] heard about our clinic from a friend on Facebook.  [name] states her family eats meals together she thinks her family will eat healthier with  her her desired weight loss is 54 lbs she has been heavy most of  her life she started gaining weight when she was a baby, per her father her heaviest weight ever was 305 lbs she has significant food cravings issues  she is frequently drinking liquids with calories she frequently makes poor food choices she struggles with emotional eating    Fatigue [name] feels her energy is lower than it should be. This has worsened with weight gain and has not worsened recently. [name] denies daytime somnolence and  admits to waking up still tired. Patient is at risk for obstructive sleep apnea. Patent has a history of symptoms of morning headache. Patient generally gets 4 hours of sleep per night, and states they generally have difficulty falling back asleep if awakened. Snoring is present. Apneic episodes are not present. Epworth Sleepiness Score is 0.  Dyspnea on exertion [name] notes increasing shortness of breath with exercising and seems to be worsening over time with weight gain. She notes getting out of breath sooner with activity than she used to. This has not gotten worse recently. EKG-normal sinus rhythm. [name] denies orthopnea.  Hypertension [name] is a 68 y.o. female with  hypertension. She has had elevated blood pressures for 5 to 6 years. She is on lisinopril and denies chest pain. She is working on weight loss to help control her blood pressure with the goal of decreasing her risk of heart attack and stroke. [name]'s blood pressure is not currently controlled.  Pre-Diabetes [name] has a diagnosis of pre-diabetes based on her elevated Hgb A1c and has had this diagnosis for 6 years. She was informed this puts her at greater risk of developing diabetes. She is not on medications and would like to work on diet and exercise to decrease risk of diabetes. She denies nausea or hypoglycemia.  Depression with Anxiety [name] is not on medications. She was anxious throughout the appointment. She shows no sign of suicidal or homicidal ideations.  Depression screen [name] 2/9 10/31/2018 02/11/2018 05/21/2017  Decreased Interest 1 1 3   Down, Depressed, Hopeless 0 1 2  PHQ - 2 Score 1 2 5   Altered sleeping 0 2 2  Tired, decreased energy 0 2 3  Change in appetite 2 2 2   Feeling bad or failure about yourself  0 1 2  Trouble concentrating 0 1 2  Moving slowly or fidgety/restless 1 2 0  Suicidal thoughts 0 0 -  PHQ-9 Score 4 12 16   Difficult doing work/chores Not difficult at all Not difficult at all Somewhat difficult    Depression Screen [name] Food and Mood (modified PHQ-9) score was  Depression screen PHQ 2/9 10/31/2018  Decreased Interest 1  Down, Depressed, Hopeless 0  PHQ - 2 Score 1  Altered sleeping  0  Tired, decreased energy 0  Change in appetite 2  Feeling bad or failure about yourself  0  Trouble concentrating 0  Moving slowly or fidgety/restless 1  Suicidal thoughts 0  PHQ-9 Score 4  Difficult doing work/chores Not difficult at all    ASSESSMENT AND PLAN:  Other fatigue - Plan: EKG 12-Lead, VITAMIN D 25 Hydroxy (Vit-D Deficiency, Fractures), T3, T4, free, TSH  SOB (shortness of breath) on exertion - Plan: T3, T4, free, TSH, ECHOCARDIOGRAM  COMPLETE  Essential hypertension  Prediabetes - Plan: Hemoglobin A1c, Insulin, random, Lipid Panel With LDL/HDL Ratio  Depression with anxiety  Class 3 severe obesity with serious comorbidity and body mass index (BMI) of 45.0 to 49.9 in adult, unspecified obesity type (HCC)  PLAN:  Fatigue [name] was informed that her fatigue may be related to obesity, depression or many other causes. Labs will be ordered, and in the meanwhile [name] has agreed to work on diet, exercise and weight loss to help with fatigue. Proper sleep hygiene was discussed including the need for 7-8 hours of quality sleep each night. A sleep study was ordered based on symptoms and Epworth score.  Dyspnea on exertion [name] shortness of breath appears to be obesity related and exercise induced. She has agreed to work on weight loss and gradually increase exercise to treat her exercise induced shortness of breath. If [name] follows our instructions and loses weight without improvement of her shortness of breath, we will plan to refer to pulmonology. We will monitor this condition regularly. [name] agrees to this plan. We have sent a referral to [facility] for an echocardiogram, no authorization needed.  Hypertension We discussed sodium restriction, working on healthy weight loss, and a regular exercise program as the means to achieve improved blood pressure control. [name] agreed with this plan and agreed to follow up as directed. We will continue to monitor her blood pressure as well as her progress with the above lifestyle modifications. [name] will continue her medications as prescribed and will watch for signs of hypotension as she continues her lifestyle modifications. EKG was done, and we will check FLP today. [name] agrees to follow up with our clinic in 2 weeks.  Pre-Diabetes [name] will continue to work on weight loss, exercise, and decreasing simple carbohydrates in her diet to help decrease the risk of  diabetes. We dicussed metformin including benefits and risks. She was informed that eating too many simple carbohydrates or too many calories at one sitting increases the likelihood of GI side effects. [name] declined metformin for now and a prescription was not written today. We will check Hgb A1c and insulin today. [name] agrees to follow up with our clinic in 2 weeks as directed to monitor her progress.  Depression with Anxiety We discussed behavior modification techniques today to help [name] deal with her depression and anxiety. She is to call a counselor today to set up an appointment. [name] agrees to follow up with our clinic in 2 weeks.  Depression Screen [name] had a negative depression screening. Depression is commonly associated with obesity and often results in emotional eating behaviors. We will monitor this closely and work on CBT to help improve the non-hunger eating patterns. Referral to Psychology may be required if no improvement is seen as she continues in our clinic.  Obesity [name] is currently in the action stage of change and her goal is to continue with weight loss efforts She has agreed to follow the Category 2 plan [name] has been  instructed to work up to a goal of 150 minutes of combined cardio and strengthening exercise per week for weight loss and overall health benefits. We discussed the following Behavioral Modification Strategies today: increasing lean protein intake, increasing vegetables and work on meal planning and easy cooking plans, better snacking choices, and planning for success  [name] has agreed to follow up with our clinic in 2 weeks. She was informed of the importance of frequent follow up visits to maximize her success with intensive lifestyle modifications for her multiple health conditions. She was informed we would discuss her lab results at her next visit unless there is a critical issue that needs to be addressed sooner. [name] agreed to  keep her next visit at the agreed upon time to discuss these results.  ALLERGIES: Allergies  Allergen Reactions  . Codeine     REACTION: Nausea  . Cyclosporine Swelling    MEDICATIONS: Current Outpatient Medications on File Prior to Visit  Medication Sig Dispense Refill  . acetaminophen (TYLENOL) 500 MG tablet Take 500 mg by mouth every 8 (eight) hours as needed (pain).     [name] atorvastatin (LIPITOR) 40 MG tablet TAKE 1 TABLET DAILY 90 tablet 1  . chlorzoxazone (PARAFON) 500 MG tablet Take by mouth 4 (four) times daily as needed for muscle spasms.    . cimetidine (TAGAMET) 200 MG tablet Take 200 mg by mouth 2 (two) times daily.    [name] HYDROcodone-acetaminophen (NORCO/VICODIN) 5-325 MG tablet Take one tablet by mouth every 6 hours as needed for pain 16 tablet 0  . lisinopril (PRINIVIL,ZESTRIL) 10 MG tablet TAKE 1 TABLET DAILY 90 tablet 1  . OVER THE COUNTER MEDICATION daily.      Current Facility-Administered Medications on File Prior to Visit  Medication Dose Route Frequency Provider Last Rate Last Dose  . methylPREDNISolone acetate (DEPO-MEDROL) injection 40 mg  40 mg Intra-articular Once [name], MD        PAST MEDICAL HISTORY: Past Medical History:  Diagnosis Date  . Allergic rhinitis   . Anxiety   . Arthritis   . Asthma   . Constipation   . Depression   . Gallbladder problem   . Hematuria 08/25/2015  . Hyperlipidemia   . Hypertension   . Obesity   . Osteoarthritis   . Pelvic pain in female 08/25/2015  . Rheumatoid arthritis ([place])   . Stroke (cerebrum) ([place])   . Swallowing difficulty   . Swelling     PAST SURGICAL HISTORY: Past Surgical History:  Procedure Laterality Date  . BACK SURGERY    . CHOLECYSTECTOMY    . COLONOSCOPY  2007   sessile sigmoid polyp x 1; path: serrated adenoma  . COLONOSCOPY N/A 10/12/2014   Procedure: COLONOSCOPY;  Surgeon: [name], MD;  Location: [facility];  Service: Endoscopy;  Laterality: N/A;  . JOINT REPLACEMENT      BIL knee   . KNEE ARTHROPLASTY    . KNEE SURGERY     bilateral knee replacement  . LIPOMA EXCISION     x 2  . TONSILLECTOMY AND ADENOIDECTOMY      SOCIAL HISTORY: Social History   Tobacco Use  . Smoking status: Never Smoker  . Smokeless tobacco: Never Used  Substance Use Topics  . Alcohol use: Yes    Alcohol/week: 0.0 standard drinks    Comment: sometimes; rarely  . Drug use: No    FAMILY HISTORY: Family History  Problem Relation Age of Onset  . Heart attack  Father   . Alcoholism Father   . Depression Father   . Heart disease Father   . Hyperlipidemia Father   . Hypertension Father   . Leukemia Mother   . Hypertension Mother   . Cancer Mother   . Depression Mother   . Obesity Sister   . Other Sister        breathing problems  . Hepatitis C Sister   . Arthritis Sister   . Emphysema Brother   . Obesity Daughter   . Polycystic ovary syndrome Daughter   . Obesity Son   . Other Son        knee problems  . Cancer Maternal Grandmother        breast  . Other Brother        aneursym  . Arthritis Sister   . Obesity Son   . Asthma Son   . Obesity Son   . Diabetes Other        runs on dad's side of the family  . Colon cancer Neg Hx        states she doesn't know a lot about her family, but has never heard of specific colon ca    ROS: Review of Systems  Constitutional: Positive for malaise/fatigue. Negative for weight loss.       + Trouble sleeping  HENT:       + Decreased hearing (use hearing aids) + Hay fever + Hoarseness  Eyes:       + Wear glasses or contacts  Respiratory: Positive for shortness of breath (with exertion).   Cardiovascular: Negative for chest pain and orthopnea.  Gastrointestinal: Positive for constipation (occasional). Negative for nausea.       + Swallowing difficulty  Musculoskeletal: Positive for back pain.       + Muscle or joint pain + Muscle stiffness  Skin:       + Dryness  Neurological: Positive for tremors (occasional).   Endo/Heme/Allergies:       Negative hypoglycemia + Heat/cold intolerance  Psychiatric/Behavioral: Positive for depression. Negative for suicidal ideas. The patient is nervous/anxious.        + Anxiety    PHYSICAL EXAM: Blood pressure 139/80, pulse 74, temperature (!) 97.5 F (36.4 C), temperature source Oral, height 5\' 3"  (1.6 m), weight 267 lb (121.1 kg), SpO2 98 %. Body mass index is 47.3 kg/m. Physical Exam Vitals signs reviewed.  Constitutional:      Appearance: Normal appearance. She is obese.  HENT:     Head: Normocephalic and atraumatic.     Nose: Nose normal.  Eyes:     General: No scleral icterus.    Extraocular Movements: Extraocular movements intact.  Neck:     Musculoskeletal: Normal range of motion and neck supple.     Comments: No thyromegaly present Cardiovascular:     Rate and Rhythm: Normal rate and regular rhythm.     Pulses: Normal pulses.     Heart sounds: Murmur (3/6 systolic murmur heard best at aortic area o/o radiation) present.  Pulmonary:     Effort: Pulmonary effort is normal. No respiratory distress.     Breath sounds: Normal breath sounds.  Abdominal:     Palpations: Abdomen is soft.     Tenderness: There is no abdominal tenderness.     Comments: + Obesity  Musculoskeletal: Normal range of motion.     Right lower leg: No edema.     Left lower leg: No edema.  Skin:  General: Skin is warm and dry.  Neurological:     Mental Status: She is alert and oriented to person, place, and time.     Coordination: Coordination normal.  Psychiatric:        Mood and Affect: Mood normal.        Behavior: Behavior normal.     RECENT LABS AND TESTS: BMET    Component Value Date/Time   NA 139 10/30/2018 1422   NA 142 04/05/2018 1119   K 4.6 10/30/2018 1422   CL 104 10/30/2018 1422   CO2 26 10/30/2018 1422   GLUCOSE 89 10/30/2018 1422   BUN 15 10/30/2018 1422   BUN 11 04/05/2018 1119   CREATININE 0.95 10/30/2018 1422   CALCIUM 10.6 (H)  10/30/2018 1422   GFRNONAA 62 10/30/2018 1422   GFRAA 72 10/30/2018 1422   No results found for: HGBA1C No results found for: INSULIN CBC    Component Value Date/Time   WBC 6.0 10/30/2018 1422   RBC 4.65 10/30/2018 1422   HGB 12.7 10/30/2018 1422   HGB 11.9 04/05/2018 1119   HCT 38.9 10/30/2018 1422   HCT 37.5 04/05/2018 1119   PLT 227 10/30/2018 1422   PLT 235 04/05/2018 1119   MCV 83.7 10/30/2018 1422   MCV 88 04/05/2018 1119   MCH 27.3 10/30/2018 1422   MCHC 32.6 10/30/2018 1422   RDW 14.9 10/30/2018 1422   RDW 18.4 (H) 04/05/2018 1119   LYMPHSABS 1,704 10/30/2018 1422   LYMPHSABS 1.0 04/05/2018 1119   MONOABS 924 03/06/2017 0923   EOSABS 108 10/30/2018 1422   EOSABS 0.1 04/05/2018 1119   BASOSABS 30 10/30/2018 1422   BASOSABS 0.0 04/05/2018 1119   Iron/TIBC/Ferritin/ %Sat No results found for: IRON, TIBC, FERRITIN, IRONPCTSAT Lipid Panel     Component Value Date/Time   CHOL 218 (H) 11/06/2017 1041   TRIG 68 11/06/2017 1041   HDL 58 11/06/2017 1041   CHOLHDL 3.8 11/06/2017 1041   CHOLHDL 6.6 07/09/2014 0819   VLDL 13 07/09/2014 0819   LDLCALC 146 (H) 11/06/2017 1041   Hepatic Function Panel     Component Value Date/Time   PROT 7.0 10/30/2018 1422   PROT 6.5 04/05/2018 1119   ALBUMIN 4.1 04/05/2018 1119   AST 19 10/30/2018 1422   ALT 12 10/30/2018 1422   ALKPHOS 91 04/05/2018 1119   BILITOT 0.6 10/30/2018 1422   BILITOT 0.6 04/05/2018 1119   BILIDIR 0.15 12/20/2016 0857   IBILI 0.4 07/09/2014 0819      Component Value Date/Time   TSH 0.67 03/06/2017 0923   Vitamin D No recent labs  ECG  shows NSR with a rate of 67 BPM INDIRECT CALORIMETER done today shows a VO2 of 211 and a REE of 1472. Her calculated basal metabolic rate is 6269 thus her basal metabolic rate is worse than expected.       OBESITY BEHAVIORAL INTERVENTION VISIT  Today's visit was # 1   Starting weight: 267 lbs Starting date: 10/31/2018 Today's weight : 267 lbs Today's  date: 10/31/2018 Total lbs lost to date: 0 At least 15 minutes were spent on discussing the following behavioral intervention visit.    10/31/2018  Height 5\' 3"  (1.6 m)  Weight 267 lb (121.1 kg)  BMI (Calculated) 47.31  BLOOD PRESSURE - SYSTOLIC 485  BLOOD PRESSURE - DIASTOLIC 80  Waist Measurement  48 inches   Body Fat % 47.5 %  RMR 1472    ASK: We discussed the diagnosis of obesity  with [name] today and [name] agreed to give [place] permission to discuss obesity behavioral modification therapy today.  ASSESS: [name] has the diagnosis of obesity and her BMI today is 47.31 [name] is in the action stage of change   ADVISE: [name] was educated on the multiple health risks of obesity as well as the benefit of weight loss to improve her health. She was advised of the need for long term treatment and the importance of lifestyle modifications to improve her current health and to decrease her risk of future health problems.  AGREE: Multiple dietary modification options and treatment options were discussed and  [name] agreed to follow the recommendations documented in the above note.  ARRANGE: Destony was educated on the importance of frequent visits to treat obesity as outlined per CMS and USPSTF guidelines and agreed to schedule her next follow up appointment today.   I, Trixie Dredge, am acting as transcriptionist for Ilene Qua, MD    I have reviewed the above documentation for accuracy and completeness, and I agree with the above. - Ilene Qua, MD

## 2018-11-01 LAB — VITAMIN D 25 HYDROXY (VIT D DEFICIENCY, FRACTURES): Vit D, 25-Hydroxy: 25.6 ng/mL — ABNORMAL LOW (ref 30.0–100.0)

## 2018-11-01 LAB — LIPID PANEL WITH LDL/HDL RATIO
Cholesterol, Total: 203 mg/dL — ABNORMAL HIGH (ref 100–199)
HDL: 55 mg/dL (ref >39)
LDL Calculated: 138 mg/dL — ABNORMAL HIGH (ref 0–99)
LDl/HDL Ratio: 2.5 ratio (ref 0.0–3.2)
TRIGLYCERIDES: 49 mg/dL (ref 0–149)
VLDL CHOLESTEROL CAL: 10 mg/dL (ref 5–40)

## 2018-11-01 LAB — HEMOGLOBIN A1C
Est. average glucose Bld gHb Est-mCnc: 111 mg/dL
Hgb A1c MFr Bld: 5.5 % (ref 4.8–5.6)

## 2018-11-01 LAB — TSH: TSH: 1.18 u[IU]/mL (ref 0.450–4.500)

## 2018-11-01 LAB — T4, FREE: Free T4: 1.47 ng/dL (ref 0.82–1.77)

## 2018-11-01 LAB — INSULIN, RANDOM: INSULIN: 12.3 u[IU]/mL (ref 2.6–24.9)

## 2018-11-01 LAB — T3: T3 TOTAL: 120 ng/dL (ref 71–180)

## 2018-11-04 ENCOUNTER — Other Ambulatory Visit: Payer: Self-pay

## 2018-11-04 ENCOUNTER — Ambulatory Visit (HOSPITAL_COMMUNITY)
Admission: RE | Admit: 2018-11-04 | Discharge: 2018-11-04 | Disposition: A | Discharge status: Home / Self Care | Payer: PPO | Source: Ambulatory Visit | Attending: Family Medicine | Admitting: Family Medicine

## 2018-11-04 DIAGNOSIS — R0602 Shortness of breath: Secondary | ICD-10-CM | POA: Diagnosis not present

## 2018-11-04 MED ORDER — HYDROXYCHLOROQUINE SULFATE 200 MG PO TABS: 200.0000 mg | ORAL_TABLET | Freq: Two times a day (BID) | ORAL | 1 refills | Status: DC

## 2018-11-04 NOTE — Telephone Encounter (Signed)
Labs have resulted. Prescription sent to the pharmacy.  

## 2018-11-04 NOTE — Progress Notes (Signed)
*  PRELIMINARY RESULTS* Echocardiogram 2D Echocardiogram has been performed.  Samuel [place] 11/04/2018, 2:49 PM

## 2018-11-05 ENCOUNTER — Encounter (INDEPENDENT_AMBULATORY_CARE_PROVIDER_SITE_OTHER): Payer: Self-pay | Admitting: Family Medicine

## 2018-11-08 ENCOUNTER — Ambulatory Visit: Payer: BLUE CROSS/BLUE SHIELD | Admitting: Family Medicine

## 2018-11-13 ENCOUNTER — Encounter (INDEPENDENT_AMBULATORY_CARE_PROVIDER_SITE_OTHER): Payer: Self-pay

## 2018-11-14 ENCOUNTER — Encounter (INDEPENDENT_AMBULATORY_CARE_PROVIDER_SITE_OTHER): Payer: Self-pay | Admitting: Family Medicine

## 2018-11-14 ENCOUNTER — Ambulatory Visit (INDEPENDENT_AMBULATORY_CARE_PROVIDER_SITE_OTHER): Payer: PPO | Admitting: Family Medicine

## 2018-11-14 ENCOUNTER — Other Ambulatory Visit: Payer: Self-pay

## 2018-11-14 DIAGNOSIS — Z6841 Body Mass Index (BMI) 40.0 and over, adult: Secondary | ICD-10-CM

## 2018-11-14 DIAGNOSIS — E559 Vitamin D deficiency, unspecified: Secondary | ICD-10-CM

## 2018-11-14 DIAGNOSIS — R7303 Prediabetes: Secondary | ICD-10-CM

## 2018-11-18 NOTE — Progress Notes (Signed)
Office: [phone]  /  Fax: [phone] TeleHealth Visit:  [name] has consented to this TeleHealth visit today via telephone call. The patient is located at home, the provider is located at the [employer] [facility] office. The participants in this visit include the listed provider and patient and provider's assistant. Time spent on visit was 30 minutes  HPI:   Chief Complaint: OBESITY [name] is here to discuss her progress with her obesity treatment plan. She is on the Category 2 plan and is following her eating plan approximately 98 % of the time. She states she is walking for 10-15 minutes 3-4 times per week. [name] is staying at home and find it to be not much different than her normal. She is not weighing herself daily, but every 2-3 days. She notes increased protein helps with her pain. She notes hunger if she waits for [name] to eat lunch. She is getting most of the food in.  We were unable to weight the patient today for this TeleHealth visit. She feels as if she has lost 11 lbs since her last visit. She has lost 11 lbs since starting treatment with [place].  Vitamin D Deficiency [name] has a diagnosis of vitamin D deficiency. She is not currently taking Vit D. She note fatigue and denies nausea, vomiting or muscle weakness.  Pre-Diabetes Wave has a diagnosis of pre-diabetes based on her elevated Hgb A1c of 5.5 and insulin of 12.3. She was informed this puts her at greater risk of developing diabetes. She is not taking metformin currently and continues to work on diet and exercise to decrease risk of diabetes. She denies nausea or hypoglycemia.  ASSESSMENT AND PLAN:  Vitamin D deficiency - Plan: Vitamin D, Ergocalciferol, (DRISDOL) 1.25 MG (50000 UT) CAPS capsule  Prediabetes  Class 3 severe obesity with serious comorbidity and body mass index (BMI) of 45.0 to 49.9 in adult, unspecified obesity type ([place])  PLAN:  Vitamin D Deficiency [name] was informed that  low vitamin D levels contributes to fatigue and are associated with obesity, breast, and colon cancer. [name] agrees to start prescription Vit D @50 ,000 IU every week #4 with no refills. She will follow up for routine testing of vitamin D, at least 2-3 times per year. She was informed of the risk of over-replacement of vitamin D and agrees to not increase her dose unless she discusses this with [place] first. [name] agrees to follow up with our clinic 3 weeks.  Pre-Diabetes [name] will continue to work on weight loss, exercise, and decreasing simple carbohydrates in her diet to help decrease the risk of diabetes. We dicussed metformin including benefits and risks. She was informed that eating too many simple carbohydrates or too many calories at one sitting increases the likelihood of GI side effects. [name] declined metformin for now and a prescription was not written today. We will repeat labs in 3 months. [name] agrees to follow up with our clinic in 3 weeks as directed to monitor her progress.  Obesity [name] is currently in the action stage of change. As such, her goal is to continue with weight loss efforts She has agreed to follow the Category 2 plan [name] has been instructed to work up to a goal of 150 minutes of combined cardio and strengthening exercise per week for weight loss and overall health benefits. We discussed the following Behavioral Modification Strategies today: increasing lean protein intake, increasing vegetables and work on meal planning and easy cooking plans, and planning for  success   [name] has agreed to follow up with our clinic in 3 weeks. She was informed of the importance of frequent follow up visits to maximize her success with intensive lifestyle modifications for her multiple health conditions.  ALLERGIES: Allergies  Allergen Reactions  . Codeine     REACTION: Nausea  . Cyclosporine Swelling    MEDICATIONS: Current Outpatient Medications on File Prior  to Visit  Medication Sig Dispense Refill  . acetaminophen (TYLENOL) 500 MG tablet Take 500 mg by mouth every 8 (eight) hours as needed (pain).     [name] atorvastatin (LIPITOR) 40 MG tablet TAKE 1 TABLET DAILY 90 tablet 1  . chlorzoxazone (PARAFON) 500 MG tablet Take by mouth 4 (four) times daily as needed for muscle spasms.    . cimetidine (TAGAMET) 200 MG tablet Take 200 mg by mouth 2 (two) times daily.    [name] HYDROcodone-acetaminophen (NORCO/VICODIN) 5-325 MG tablet Take one tablet by mouth every 6 hours as needed for pain 16 tablet 0  . hydroxychloroquine (PLAQUENIL) 200 MG tablet Take 1 tablet (200 mg total) by mouth 2 (two) times daily. 60 tablet 1  . lisinopril (PRINIVIL,ZESTRIL) 10 MG tablet TAKE 1 TABLET DAILY 90 tablet 1  . OVER THE COUNTER MEDICATION daily.      Current Facility-Administered Medications on File Prior to Visit  Medication Dose Route Frequency Provider Last Rate Last Dose  . methylPREDNISolone acetate (DEPO-MEDROL) injection 40 mg  40 mg Intra-articular Once Catherine Kirschner, MD        PAST MEDICAL HISTORY: Past Medical History:  Diagnosis Date  . Allergic rhinitis   . Anxiety   . Arthritis   . Asthma   . Constipation   . Depression   . Gallbladder problem   . Hematuria 08/25/2015  . Hyperlipidemia   . Hypertension   . Obesity   . Osteoarthritis   . Pelvic pain in female 08/25/2015  . Rheumatoid arthritis ([place])   . Stroke (cerebrum) ([place])   . Swallowing difficulty   . Swelling     PAST SURGICAL HISTORY: Past Surgical History:  Procedure Laterality Date  . BACK SURGERY    . CHOLECYSTECTOMY    . COLONOSCOPY  2007   sessile sigmoid polyp x 1; path: serrated adenoma  . COLONOSCOPY N/A 10/12/2014   Procedure: COLONOSCOPY;  Surgeon: [name], MD;  [facility];  Service: Endoscopy;  Laterality: N/A;  . JOINT REPLACEMENT     BIL knee   . KNEE ARTHROPLASTY    . KNEE SURGERY     bilateral knee replacement  . LIPOMA EXCISION     x 2  .  TONSILLECTOMY AND ADENOIDECTOMY      SOCIAL HISTORY: Social History   Tobacco Use  . Smoking status: Never Smoker  . Smokeless tobacco: Never Used  Substance Use Topics  . Alcohol use: Yes    Alcohol/week: 0.0 standard drinks    Comment: sometimes; rarely  . Drug use: No    FAMILY HISTORY: Family History  Problem Relation Age of Onset  . Heart attack Father   . Alcoholism Father   . Depression Father   . Heart disease Father   . Hyperlipidemia Father   . Hypertension Father   . Leukemia Mother   . Hypertension Mother   . Cancer Mother   . Depression Mother   . Obesity Sister   . Other Sister        breathing problems  . Hepatitis C Sister   .  Arthritis Sister   . Emphysema Brother   . Obesity Daughter   . Polycystic ovary syndrome Daughter   . Obesity Son   . Other Son        knee problems  . Cancer Maternal Grandmother        breast  . Other Brother        aneursym  . Arthritis Sister   . Obesity Son   . Asthma Son   . Obesity Son   . Diabetes Other        runs on dad's side of the family  . Colon cancer Neg Hx        states she doesn't know a lot about her family, but has never heard of specific colon ca    ROS: Review of Systems  Constitutional: Positive for malaise/fatigue and weight loss.  Gastrointestinal: Negative for nausea and vomiting.  Musculoskeletal:       Negative muscle weakness  Endo/Heme/Allergies:       Negative hypoglycemia    PHYSICAL EXAM: Pt in no acute distress  RECENT LABS AND TESTS: BMET    Component Value Date/Time   NA 139 10/30/2018 1422   NA 142 04/05/2018 1119   K 4.6 10/30/2018 1422   CL 104 10/30/2018 1422   CO2 26 10/30/2018 1422   GLUCOSE 89 10/30/2018 1422   BUN 15 10/30/2018 1422   BUN 11 04/05/2018 1119   CREATININE 0.95 10/30/2018 1422   CALCIUM 10.6 (H) 10/30/2018 1422   GFRNONAA 62 10/30/2018 1422   GFRAA 72 10/30/2018 1422   Lab Results  Component Value Date   HGBA1C 5.5 10/31/2018   Lab  Results  Component Value Date   INSULIN 12.3 10/31/2018   CBC    Component Value Date/Time   WBC 6.0 10/30/2018 1422   RBC 4.65 10/30/2018 1422   HGB 12.7 10/30/2018 1422   HGB 11.9 04/05/2018 1119   HCT 38.9 10/30/2018 1422   HCT 37.5 04/05/2018 1119   PLT 227 10/30/2018 1422   PLT 235 04/05/2018 1119   MCV 83.7 10/30/2018 1422   MCV 88 04/05/2018 1119   MCH 27.3 10/30/2018 1422   MCHC 32.6 10/30/2018 1422   RDW 14.9 10/30/2018 1422   RDW 18.4 (H) 04/05/2018 1119   LYMPHSABS 1,704 10/30/2018 1422   LYMPHSABS 1.0 04/05/2018 1119   MONOABS 924 03/06/2017 0923   EOSABS 108 10/30/2018 1422   EOSABS 0.1 04/05/2018 1119   BASOSABS 30 10/30/2018 1422   BASOSABS 0.0 04/05/2018 1119   Iron/TIBC/Ferritin/ %Sat No results found for: IRON, TIBC, FERRITIN, IRONPCTSAT Lipid Panel     Component Value Date/Time   CHOL 203 (H) 10/31/2018 1253   TRIG 49 10/31/2018 1253   HDL 55 10/31/2018 1253   CHOLHDL 3.8 11/06/2017 1041   CHOLHDL 6.6 07/09/2014 0819   VLDL 13 07/09/2014 0819   LDLCALC 138 (H) 10/31/2018 1253   Hepatic Function Panel     Component Value Date/Time   PROT 7.0 10/30/2018 1422   PROT 6.5 04/05/2018 1119   ALBUMIN 4.1 04/05/2018 1119   AST 19 10/30/2018 1422   ALT 12 10/30/2018 1422   ALKPHOS 91 04/05/2018 1119   BILITOT 0.6 10/30/2018 1422   BILITOT 0.6 04/05/2018 1119   BILIDIR 0.15 12/20/2016 0857   IBILI 0.4 07/09/2014 0819      Component Value Date/Time   TSH 1.180 10/31/2018 1253   TSH 0.67 03/06/2017 0923   TSH 1.899 03/18/2009 0000  I, Trixie Dredge, am acting as transcriptionist for Ilene Qua, MD  I have reviewed the above documentation for accuracy and completeness, and I agree with the above. - Ilene Qua, MD

## 2018-11-28 ENCOUNTER — Other Ambulatory Visit: Payer: Self-pay | Admitting: Rheumatology

## 2018-11-28 DIAGNOSIS — M0579 Rheumatoid arthritis with rheumatoid factor of multiple sites without organ or systems involvement: Secondary | ICD-10-CM

## 2018-11-28 NOTE — Telephone Encounter (Signed)
Last Visit: 10/30/18 Next Visit: 01/30/19 Labs: 10/30/18 Calcium is elevated-10.6.Rest of lab work is WNL.  Okay to refill per Dr. Estanislado Pandy

## 2018-12-04 MED ORDER — VITAMIN D (ERGOCALCIFEROL) 1.25 MG (50000 UNIT) PO CAPS: 50000.0000 [IU] | ORAL_CAPSULE | ORAL | 0 refills | Status: DC

## 2018-12-05 ENCOUNTER — Encounter (INDEPENDENT_AMBULATORY_CARE_PROVIDER_SITE_OTHER): Payer: Self-pay | Admitting: Family Medicine

## 2018-12-05 ENCOUNTER — Ambulatory Visit (INDEPENDENT_AMBULATORY_CARE_PROVIDER_SITE_OTHER): Payer: PPO | Admitting: Family Medicine

## 2018-12-05 ENCOUNTER — Other Ambulatory Visit: Payer: Self-pay

## 2018-12-05 DIAGNOSIS — E559 Vitamin D deficiency, unspecified: Secondary | ICD-10-CM

## 2018-12-05 DIAGNOSIS — Z6841 Body Mass Index (BMI) 40.0 and over, adult: Secondary | ICD-10-CM | POA: Diagnosis not present

## 2018-12-05 DIAGNOSIS — E7849 Other hyperlipidemia: Secondary | ICD-10-CM | POA: Diagnosis not present

## 2018-12-11 NOTE — Progress Notes (Signed)
Office: [phone]  /  Fax: [phone] TeleHealth Visit:  [name] has verbally consented to this TeleHealth visit today. The patient is located at home, the provider is located at the [employer] [facility] office. The participants in this visit include the listed provider and patient and patient's husband. The visit was conducted today via face time.  HPI:   Chief Complaint: OBESITY [name] is here to discuss her progress with her obesity treatment plan. She is on the Category 2 plan and is following her eating plan approximately 50 % of the time. She states she is walking a small amount. [name] is taking Plaquenil (started about 1 month ago) and is experiencing significant abdominal symptoms, therefore not eating most of the time. She is often skipping toast and cheese, but she is getting snacks in though. She is interested in possible vegetable plan. She is using fruit pouches to get fruit in.  We were unable to weigh the patient today for this TeleHealth visit. She feels as if she has lost 5 lbs since her last visit. She has lost 0 lbs since starting treatment with [place].  Vitamin D Deficiency [name] has a diagnosis of vitamin D deficiency. She is currently taking prescription Vit D. She notes fatigue and denies nausea, vomiting or muscle weakness.  Hyperlipidemia [name] has hyperlipidemia and has been trying to improve her cholesterol levels with intensive lifestyle modification including a low saturated fat diet, exercise and weight loss. Last LDL was of 138 and HDL of 55. She denies any chest pain, claudication or myalgias.  ASSESSMENT AND PLAN:  Vitamin D deficiency - Plan: Vitamin D, Ergocalciferol, (DRISDOL) 1.25 MG (50000 UT) CAPS capsule  Other hyperlipidemia  Class 3 severe obesity with serious comorbidity and body mass index (BMI) of 45.0 to 49.9 in adult, unspecified obesity type ([place])  PLAN:  Vitamin D Deficiency [name] was informed that low vitamin D  levels contributes to fatigue and are associated with obesity, breast, and colon cancer. [name] agrees to continue taking prescription Vit D @50 ,000 IU every week #4 and we will refill for 1 month. She will follow up for routine testing of vitamin D, at least 2-3 times per year. She was informed of the risk of over-replacement of vitamin D and agrees to not increase her dose unless she discusses this with [place] first. [name] agrees to follow up with our clinic in 2 weeks.  Hyperlipidemia [name] was informed of the American Heart Association Guidelines emphasizing intensive lifestyle modifications as the first line treatment for hyperlipidemia. We discussed many lifestyle modifications today in depth, and [name] will continue to work on decreasing saturated fats such as fatty red meat, butter and many fried foods. She will also increase vegetables and lean protein in her diet and continue to work on exercise and weight loss efforts. We will repeat FLP at the end of June. [name] agrees to follow up with our clinic in 2 weeks.  Obesity [name] is currently in the action stage of change. As such, her goal is to continue with weight loss efforts She has agreed to follow the Category 2 plan and follow our protein rich vegetarian plan [name] has been instructed to work up to a goal of 150 minutes of combined cardio and strengthening exercise per week for weight loss and overall health benefits. We discussed the following Behavioral Modification Strategies today: increasing lean protein intake, increasing vegetables, work on meal planning and easy cooking plans, emotional eating strategies, ways to avoid night  time snacking, and planning for success   [name] has agreed to follow up with our clinic in 2 weeks. She was informed of the importance of frequent follow up visits to maximize her success with intensive lifestyle modifications for her multiple health conditions.  ALLERGIES: Allergies  Allergen  Reactions  . Codeine     REACTION: Nausea  . Cyclosporine Swelling    MEDICATIONS: Current Outpatient Medications on File Prior to Visit  Medication Sig Dispense Refill  . acetaminophen (TYLENOL) 500 MG tablet Take 500 mg by mouth every 8 (eight) hours as needed (pain).     [name] atorvastatin (LIPITOR) 40 MG tablet TAKE 1 TABLET DAILY 90 tablet 1  . chlorzoxazone (PARAFON) 500 MG tablet Take by mouth 4 (four) times daily as needed for muscle spasms.    . cimetidine (TAGAMET) 200 MG tablet Take 200 mg by mouth 2 (two) times daily.    [name] HYDROcodone-acetaminophen (NORCO/VICODIN) 5-325 MG tablet Take one tablet by mouth every 6 hours as needed for pain 16 tablet 0  . hydroxychloroquine (PLAQUENIL) 200 MG tablet TAKE 1 TABLET BY MOUTH TWICE A DAY 60 tablet 1  . lisinopril (PRINIVIL,ZESTRIL) 10 MG tablet TAKE 1 TABLET DAILY 90 tablet 1  . OVER THE COUNTER MEDICATION daily.     . Vitamin D, Ergocalciferol, (DRISDOL) 1.25 MG (50000 UT) CAPS capsule Take 1 capsule (50,000 Units total) by mouth every 7 (seven) days. 4 capsule 0   Current Facility-Administered Medications on File Prior to Visit  Medication Dose Route Frequency Provider Last Rate Last Dose  . methylPREDNISolone acetate (DEPO-MEDROL) injection 40 mg  40 mg Intra-articular Once Catherine Kirschner, MD        PAST MEDICAL HISTORY: Past Medical History:  Diagnosis Date  . Allergic rhinitis   . Anxiety   . Arthritis   . Asthma   . Constipation   . Depression   . Gallbladder problem   . Hematuria 08/25/2015  . Hyperlipidemia   . Hypertension   . Obesity   . Osteoarthritis   . Pelvic pain in female 08/25/2015  . Rheumatoid arthritis ([place])   . Stroke (cerebrum) ([place])   . Swallowing difficulty   . Swelling     PAST SURGICAL HISTORY: Past Surgical History:  Procedure Laterality Date  . BACK SURGERY    . CHOLECYSTECTOMY    . COLONOSCOPY  2007   sessile sigmoid polyp x 1; path: serrated adenoma  . COLONOSCOPY N/A 10/12/2014    Procedure: COLONOSCOPY;  Surgeon: [name], MD;  Location[facility];  Service: Endoscopy;  Laterality: N/A;  . JOINT REPLACEMENT     BIL knee   . KNEE ARTHROPLASTY    . KNEE SURGERY     bilateral knee replacement  . LIPOMA EXCISION     x 2  . TONSILLECTOMY AND ADENOIDECTOMY      SOCIAL HISTORY: Social History   Tobacco Use  . Smoking status: Never Smoker  . Smokeless tobacco: Never Used  Substance Use Topics  . Alcohol use: Yes    Alcohol/week: 0.0 standard drinks    Comment: sometimes; rarely  . Drug use: No    FAMILY HISTORY: Family History  Problem Relation Age of Onset  . Heart attack Father   . Alcoholism Father   . Depression Father   . Heart disease Father   . Hyperlipidemia Father   . Hypertension Father   . Leukemia Mother   . Hypertension Mother   . Cancer Mother   .  Depression Mother   . Obesity Sister   . Other Sister        breathing problems  . Hepatitis C Sister   . Arthritis Sister   . Emphysema Brother   . Obesity Daughter   . Polycystic ovary syndrome Daughter   . Obesity Son   . Other Son        knee problems  . Cancer Maternal Grandmother        breast  . Other Brother        aneursym  . Arthritis Sister   . Obesity Son   . Asthma Son   . Obesity Son   . Diabetes Other        runs on dad's side of the family  . Colon cancer Neg Hx        states she doesn't know a lot about her family, but has never heard of specific colon ca    ROS: Review of Systems  Constitutional: Positive for malaise/fatigue and weight loss.  Cardiovascular: Negative for chest pain and claudication.  Gastrointestinal: Negative for nausea and vomiting.  Musculoskeletal: Negative for myalgias.       Negative muscle weakness    PHYSICAL EXAM: Pt in no acute distress  RECENT LABS AND TESTS: BMET    Component Value Date/Time   NA 139 10/30/2018 1422   NA 142 04/05/2018 1119   K 4.6 10/30/2018 1422   CL 104 10/30/2018 1422   CO2 26  10/30/2018 1422   GLUCOSE 89 10/30/2018 1422   BUN 15 10/30/2018 1422   BUN 11 04/05/2018 1119   CREATININE 0.95 10/30/2018 1422   CALCIUM 10.6 (H) 10/30/2018 1422   GFRNONAA 62 10/30/2018 1422   GFRAA 72 10/30/2018 1422   Lab Results  Component Value Date   HGBA1C 5.5 10/31/2018   Lab Results  Component Value Date   INSULIN 12.3 10/31/2018   CBC    Component Value Date/Time   WBC 6.0 10/30/2018 1422   RBC 4.65 10/30/2018 1422   HGB 12.7 10/30/2018 1422   HGB 11.9 04/05/2018 1119   HCT 38.9 10/30/2018 1422   HCT 37.5 04/05/2018 1119   PLT 227 10/30/2018 1422   PLT 235 04/05/2018 1119   MCV 83.7 10/30/2018 1422   MCV 88 04/05/2018 1119   MCH 27.3 10/30/2018 1422   MCHC 32.6 10/30/2018 1422   RDW 14.9 10/30/2018 1422   RDW 18.4 (H) 04/05/2018 1119   LYMPHSABS 1,704 10/30/2018 1422   LYMPHSABS 1.0 04/05/2018 1119   MONOABS 924 03/06/2017 0923   EOSABS 108 10/30/2018 1422   EOSABS 0.1 04/05/2018 1119   BASOSABS 30 10/30/2018 1422   BASOSABS 0.0 04/05/2018 1119   Iron/TIBC/Ferritin/ %Sat No results found for: IRON, TIBC, FERRITIN, IRONPCTSAT Lipid Panel     Component Value Date/Time   CHOL 203 (H) 10/31/2018 1253   TRIG 49 10/31/2018 1253   HDL 55 10/31/2018 1253   CHOLHDL 3.8 11/06/2017 1041   CHOLHDL 6.6 07/09/2014 0819   VLDL 13 07/09/2014 0819   LDLCALC 138 (H) 10/31/2018 1253   Hepatic Function Panel     Component Value Date/Time   PROT 7.0 10/30/2018 1422   PROT 6.5 04/05/2018 1119   ALBUMIN 4.1 04/05/2018 1119   AST 19 10/30/2018 1422   ALT 12 10/30/2018 1422   ALKPHOS 91 04/05/2018 1119   BILITOT 0.6 10/30/2018 1422   BILITOT 0.6 04/05/2018 1119   BILIDIR 0.15 12/20/2016 0857   IBILI 0.4 07/09/2014 0819  Component Value Date/Time   TSH 1.180 10/31/2018 1253   TSH 0.67 03/06/2017 0923   TSH 1.899 03/18/2009 0000      I, Trixie Dredge, am acting as transcriptionist for Ilene Qua, MD  I have reviewed the above documentation  for accuracy and completeness, and I agree with the above. - Ilene Qua, MD

## 2018-12-15 MED ORDER — VITAMIN D (ERGOCALCIFEROL) 1.25 MG (50000 UNIT) PO CAPS: 50000.0000 [IU] | ORAL_CAPSULE | ORAL | 0 refills | Status: DC

## 2018-12-17 ENCOUNTER — Other Ambulatory Visit: Payer: Self-pay

## 2018-12-17 ENCOUNTER — Ambulatory Visit (INDEPENDENT_AMBULATORY_CARE_PROVIDER_SITE_OTHER): Payer: PPO | Admitting: Family Medicine

## 2018-12-17 ENCOUNTER — Encounter (INDEPENDENT_AMBULATORY_CARE_PROVIDER_SITE_OTHER): Payer: Self-pay | Admitting: Family Medicine

## 2018-12-17 DIAGNOSIS — E8881 Metabolic syndrome: Secondary | ICD-10-CM | POA: Diagnosis not present

## 2018-12-17 DIAGNOSIS — Z6841 Body Mass Index (BMI) 40.0 and over, adult: Secondary | ICD-10-CM

## 2018-12-17 DIAGNOSIS — E559 Vitamin D deficiency, unspecified: Secondary | ICD-10-CM | POA: Diagnosis not present

## 2018-12-18 NOTE — Progress Notes (Signed)
Office: [phone]  /  Fax: [phone] TeleHealth Visit:  [name] has verbally consented to this TeleHealth visit today. The patient is located at home, the provider is located at the [facility] office. The participants in this visit include the listed provider and patient. The visit was conducted today via face time.  HPI:   Chief Complaint: OBESITY [name] is here to discuss her progress with her obesity treatment plan. She is on the Category 2 plan and follow our protein rich vegetarian plan and is following her eating plan approximately 60 % of the time. She states she is walking the driveway 7 times per week. [name] voices the last few weeks she has tried following the plan as best as she could. She enjoyed the Vegetarian plan more than Category 2. She has no problem finding products on the Vegetarian plan. She feels she has lost 2 lbs. We were unable to weigh the patient today for this TeleHealth visit. She feels as if she has lost 2 lbs since her last visit. She has lost 0 lbs since starting treatment with [place].  Vitamin D Deficiency [name] has a diagnosis of vitamin D deficiency. She is currently taking prescription Vit D. She notes fatigue and denies nausea, vomiting or muscle weakness.  Insulin Resistance [name] has a diagnosis of insulin resistance based on her elevated fasting insulin level >5. Although [name]'s blood glucose readings are still under good control, insulin resistance puts her at greater risk of metabolic syndrome and diabetes. She notes carbohydrate cravings (minimal) and she is not taking metformin currently. She continues to work on diet and exercise to decrease risk of diabetes.  ASSESSMENT AND PLAN:  Vitamin D deficiency  Insulin resistance  Class 3 severe obesity with serious comorbidity and body mass index (BMI) of 45.0 to 49.9 in adult, unspecified obesity type ([place])  PLAN:  Vitamin D Deficiency [name] was informed that  low vitamin D levels contributes to fatigue and are associated with obesity, breast, and colon cancer. [name] agrees to continue taking prescription Vit D @50 ,000 IU every week and will follow up for routine testing of vitamin D, at least 2-3 times per year. She was informed of the risk of over-replacement of vitamin D and agrees to not increase her dose unless she discusses this with [place] first. [name] agrees to follow up with our clinic in 2 weeks.  Insulin Resistance [name] will continue to work on weight loss, exercise, and decreasing simple carbohydrates in her diet to help decrease the risk of diabetes. We dicussed metformin including benefits and risks. She was informed that eating too many simple carbohydrates or too many calories at one sitting increases the likelihood of GI side effects. [name] declined metformin for now and prescription was not written today. We will repeat labs at the end of June. [name] agrees to follow up with our clinic in 2 weeks as directed to monitor her progress.  Obesity [name] is currently in the action stage of change. As such, her goal is to continue with weight loss efforts She has agreed to follow the Category 2 plan and follow our protein rich vegetarian plan [name] has been instructed to work up to a goal of 150 minutes of combined cardio and strengthening exercise per week for weight loss and overall health benefits. We discussed the following Behavioral Modification Strategies today: increasing lean protein intake, increasing vegetables and work on meal planning and easy cooking plans, better snacking choices, and planning for success  [name] has agreed to follow up with our clinic in 2 weeks. She was informed of the importance of frequent follow up visits to maximize her success with intensive lifestyle modifications for her multiple health conditions.  ALLERGIES: Allergies  Allergen Reactions   Codeine     REACTION: Nausea   Cyclosporine  Swelling    MEDICATIONS: Current Outpatient Medications on File Prior to Visit  Medication Sig Dispense Refill   acetaminophen (TYLENOL) 500 MG tablet Take 500 mg by mouth every 8 (eight) hours as needed (pain).      atorvastatin (LIPITOR) 40 MG tablet TAKE 1 TABLET DAILY 90 tablet 1   chlorzoxazone (PARAFON) 500 MG tablet Take by mouth 4 (four) times daily as needed for muscle spasms.     cimetidine (TAGAMET) 200 MG tablet Take 200 mg by mouth 2 (two) times daily.     HYDROcodone-acetaminophen (NORCO/VICODIN) 5-325 MG tablet Take one tablet by mouth every 6 hours as needed for pain 16 tablet 0   hydroxychloroquine (PLAQUENIL) 200 MG tablet TAKE 1 TABLET BY MOUTH TWICE A DAY 60 tablet 1   lisinopril (PRINIVIL,ZESTRIL) 10 MG tablet TAKE 1 TABLET DAILY 90 tablet 1   OVER THE COUNTER MEDICATION daily.      Vitamin D, Ergocalciferol, (DRISDOL) 1.25 MG (50000 UT) CAPS capsule Take 1 capsule (50,000 Units total) by mouth every 7 (seven) days. 4 capsule 0   Current Facility-Administered Medications on File Prior to Visit  Medication Dose Route Frequency Provider Last Rate Last Dose   methylPREDNISolone acetate (DEPO-MEDROL) injection 40 mg  40 mg Intra-articular Once [name], MD        PAST MEDICAL HISTORY: Past Medical History:  Diagnosis Date   Allergic rhinitis    Anxiety    Arthritis    Asthma    Constipation    Depression    Gallbladder problem    Hematuria 08/25/2015   Hyperlipidemia    Hypertension    Obesity    Osteoarthritis    Pelvic pain in female 08/25/2015   Rheumatoid arthritis ([place])    Stroke (cerebrum) ([facility])    Swallowing difficulty    Swelling     PAST SURGICAL HISTORY: Past Surgical History:  Procedure Laterality Date   BACK SURGERY     CHOLECYSTECTOMY     COLONOSCOPY  2007   sessile sigmoid polyp x 1; path: serrated adenoma   COLONOSCOPY N/A 10/12/2014   Procedure: COLONOSCOPY;  Surgeon: [name], MD;  [facility]  [facility];  Service: Endoscopy;  Laterality: N/A;   JOINT REPLACEMENT     BIL knee    KNEE ARTHROPLASTY     KNEE SURGERY     bilateral knee replacement   LIPOMA EXCISION     x 2   TONSILLECTOMY AND ADENOIDECTOMY      SOCIAL HISTORY: Social History   Tobacco Use   Smoking status: Never Smoker   Smokeless tobacco: Never Used  Substance Use Topics   Alcohol use: Yes    Alcohol/week: 0.0 standard drinks    Comment: sometimes; rarely   Drug use: No    FAMILY HISTORY: Family History  Problem Relation Age of Onset   Heart attack Father    Alcoholism Father    Depression Father    Heart disease Father    Hyperlipidemia Father    Hypertension Father    Leukemia Mother    Hypertension Mother    Cancer Mother    Depression Mother    Obesity Sister  Other Sister        breathing problems   Hepatitis C Sister    Arthritis Sister    Emphysema Brother    Obesity Daughter    Polycystic ovary syndrome Daughter    Obesity Son    Other Son        knee problems   Cancer Maternal Grandmother        breast   Other Brother        aneursym   Arthritis Sister    Obesity Son    Asthma Son    Obesity Son    Diabetes Other        runs on dad's side of the family   Colon cancer Neg Hx        states she doesn't know a lot about her family, but has never heard of specific colon ca    ROS: Review of Systems  Constitutional: Positive for malaise/fatigue and weight loss.  Gastrointestinal: Negative for nausea and vomiting.  Musculoskeletal:       Negative muscle weakness    PHYSICAL EXAM: Pt in no acute distress  RECENT LABS AND TESTS: BMET    Component Value Date/Time   NA 139 10/30/2018 1422   NA 142 04/05/2018 1119   K 4.6 10/30/2018 1422   CL 104 10/30/2018 1422   CO2 26 10/30/2018 1422   GLUCOSE 89 10/30/2018 1422   BUN 15 10/30/2018 1422   BUN 11 04/05/2018 1119   CREATININE 0.95 10/30/2018 1422   CALCIUM 10.6 (H)  10/30/2018 1422   GFRNONAA 62 10/30/2018 1422   GFRAA 72 10/30/2018 1422   Lab Results  Component Value Date   HGBA1C 5.5 10/31/2018   Lab Results  Component Value Date   INSULIN 12.3 10/31/2018   CBC    Component Value Date/Time   WBC 6.0 10/30/2018 1422   RBC 4.65 10/30/2018 1422   HGB 12.7 10/30/2018 1422   HGB 11.9 04/05/2018 1119   HCT 38.9 10/30/2018 1422   HCT 37.5 04/05/2018 1119   PLT 227 10/30/2018 1422   PLT 235 04/05/2018 1119   MCV 83.7 10/30/2018 1422   MCV 88 04/05/2018 1119   MCH 27.3 10/30/2018 1422   MCHC 32.6 10/30/2018 1422   RDW 14.9 10/30/2018 1422   RDW 18.4 (H) 04/05/2018 1119   LYMPHSABS 1,704 10/30/2018 1422   LYMPHSABS 1.0 04/05/2018 1119   MONOABS 924 03/06/2017 0923   EOSABS 108 10/30/2018 1422   EOSABS 0.1 04/05/2018 1119   BASOSABS 30 10/30/2018 1422   BASOSABS 0.0 04/05/2018 1119   Iron/TIBC/Ferritin/ %Sat No results found for: IRON, TIBC, FERRITIN, IRONPCTSAT Lipid Panel     Component Value Date/Time   CHOL 203 (H) 10/31/2018 1253   TRIG 49 10/31/2018 1253   HDL 55 10/31/2018 1253   CHOLHDL 3.8 11/06/2017 1041   CHOLHDL 6.6 07/09/2014 0819   VLDL 13 07/09/2014 0819   LDLCALC 138 (H) 10/31/2018 1253   Hepatic Function Panel     Component Value Date/Time   PROT 7.0 10/30/2018 1422   PROT 6.5 04/05/2018 1119   ALBUMIN 4.1 04/05/2018 1119   AST 19 10/30/2018 1422   ALT 12 10/30/2018 1422   ALKPHOS 91 04/05/2018 1119   BILITOT 0.6 10/30/2018 1422   BILITOT 0.6 04/05/2018 1119   BILIDIR 0.15 12/20/2016 0857   IBILI 0.4 07/09/2014 0819      Component Value Date/Time   TSH 1.180 10/31/2018 1253   TSH 0.67 03/06/2017 4627  TSH 1.899 03/18/2009 0000      I, Trixie Dredge, am acting as transcriptionist for Ilene Qua, MD  I have reviewed the above documentation for accuracy and completeness, and I agree with the above. - Ilene Qua, MD

## 2018-12-28 ENCOUNTER — Other Ambulatory Visit (INDEPENDENT_AMBULATORY_CARE_PROVIDER_SITE_OTHER): Payer: Self-pay | Admitting: Family Medicine

## 2018-12-28 DIAGNOSIS — E559 Vitamin D deficiency, unspecified: Secondary | ICD-10-CM

## 2018-12-31 ENCOUNTER — Encounter: Payer: Self-pay | Admitting: Rheumatology

## 2019-01-02 ENCOUNTER — Other Ambulatory Visit: Payer: Self-pay

## 2019-01-02 ENCOUNTER — Ambulatory Visit (INDEPENDENT_AMBULATORY_CARE_PROVIDER_SITE_OTHER): Payer: PPO | Admitting: Family Medicine

## 2019-01-02 ENCOUNTER — Encounter (INDEPENDENT_AMBULATORY_CARE_PROVIDER_SITE_OTHER): Payer: Self-pay | Admitting: Family Medicine

## 2019-01-02 DIAGNOSIS — Z6841 Body Mass Index (BMI) 40.0 and over, adult: Secondary | ICD-10-CM | POA: Diagnosis not present

## 2019-01-02 DIAGNOSIS — I10 Essential (primary) hypertension: Secondary | ICD-10-CM | POA: Diagnosis not present

## 2019-01-02 DIAGNOSIS — E559 Vitamin D deficiency, unspecified: Secondary | ICD-10-CM | POA: Diagnosis not present

## 2019-01-02 NOTE — Progress Notes (Signed)
Office: [phone]  /  Fax: [phone] TeleHealth Visit:  [name] has verbally consented to this TeleHealth visit today. The patient is located at home, the provider is located at the [facility] office. The participants in this visit include the listed provider and patient and patient's husband. The visit was conducted today via face time.  HPI:   Chief Complaint: OBESITY [name] is here to discuss her progress with her obesity treatment plan. She is on the Category 2 plan or follow our protein rich vegetarian plan and is following her eating plan approximately 80 % of the time. She states she is walking in the driveway 7 times per week. [name]'s weight is of 256 lbs. Her aunt who is like a mother to her died within the past 2 weeks. She has alternated between emotional eating and emotionally not eating. She notes the food that is available in the house is only food on the plan. She is not experiencing much hnuger.  We were unable to weigh the patient today for this TeleHealth visit. She feels as if she has lost weight since her last visit. She has lost 0 lbs since starting treatment with [place].  Hypertension [name] is a 68 y.o. female with hypertension. [name] states her blood pressure is elevated at home at 145/73. She states she is compliant with her medications. She denies chest pain, chest pressure, or headaches. She is working on weight loss to help control her blood pressure with the goal of decreasing her risk of heart attack and stroke. [name]'s blood pressure is not currently controlled.  Vitamin D Deficiency [name] has a diagnosis of vitamin D deficiency. She is currently taking prescription Vit D. She notes fatigue and denies nausea, vomiting or muscle weakness.  ASSESSMENT AND PLAN:  Essential hypertension  Vitamin D deficiency  Class 3 severe obesity with serious comorbidity and body mass index (BMI) of 45.0 to 49.9 in adult, unspecified  obesity type ([place])  PLAN:  Hypertension We discussed sodium restriction, working on healthy weight loss, and a regular exercise program as the means to achieve improved blood pressure control. [name] agreed with this plan and agreed to follow up as directed. We will continue to monitor her blood pressure as well as her progress with the above lifestyle modifications. [name] agrees to continue her current medications and she is to continue checking her blood pressure at home 2-3 times per week. She will watch for signs of hypotension as she continues her lifestyle modifications. [name] agrees to follow up with our clinic in 2 weeks.  Vitamin D Deficiency [name] was informed that low vitamin D levels contributes to fatigue and are associated with obesity, breast, and colon cancer. [name] agrees to continue taking prescription Vit D @50 ,000 IU every week, no refill needed. She will follow up for routine testing of vitamin D, at least 2-3 times per year. She was informed of the risk of over-replacement of vitamin D and agrees to not increase her dose unless she discusses this with [place] first. [name] agrees to follow up with our clinic in 2 weeks.  Obesity [name] is currently in the action stage of change. As such, her goal is to continue with weight loss efforts She has agreed to follow the Category 2 plan or follow our protein rich vegetarian plan [name] has been instructed to work up to a goal of 150 minutes of combined cardio and strengthening exercise per week for weight loss and overall health benefits.  We discussed the following Behavioral Modification Strategies today: increasing lean protein intake, increasing vegetables and work on meal planning and easy cooking plans, keeping healthy foods in the home, and planning for success   [name] has agreed to follow up with our clinic in 2 weeks. She was informed of the importance of frequent follow up visits to maximize her success with  intensive lifestyle modifications for her multiple health conditions.  ALLERGIES: Allergies  Allergen Reactions  . Codeine     REACTION: Nausea  . Cyclosporine Swelling    MEDICATIONS: Current Outpatient Medications on File Prior to Visit  Medication Sig Dispense Refill  . acetaminophen (TYLENOL) 500 MG tablet Take 500 mg by mouth every 8 (eight) hours as needed (pain).     [name] atorvastatin (LIPITOR) 40 MG tablet TAKE 1 TABLET DAILY 90 tablet 1  . chlorzoxazone (PARAFON) 500 MG tablet Take by mouth 4 (four) times daily as needed for muscle spasms.    . cimetidine (TAGAMET) 200 MG tablet Take 200 mg by mouth 2 (two) times daily.    [name] HYDROcodone-acetaminophen (NORCO/VICODIN) 5-325 MG tablet Take one tablet by mouth every 6 hours as needed for pain 16 tablet 0  . hydroxychloroquine (PLAQUENIL) 200 MG tablet TAKE 1 TABLET BY MOUTH TWICE A DAY 60 tablet 1  . lisinopril (PRINIVIL,ZESTRIL) 10 MG tablet TAKE 1 TABLET DAILY 90 tablet 1  . OVER THE COUNTER MEDICATION daily.     . Vitamin D, Ergocalciferol, (DRISDOL) 1.25 MG (50000 UT) CAPS capsule Take 1 capsule (50,000 Units total) by mouth every 7 (seven) days. 4 capsule 0   Current Facility-Administered Medications on File Prior to Visit  Medication Dose Route Frequency Provider Last Rate Last Dose  . methylPREDNISolone acetate (DEPO-MEDROL) injection 40 mg  40 mg Intra-articular Once [name], MD        PAST MEDICAL HISTORY: Past Medical History:  Diagnosis Date  . Allergic rhinitis   . Anxiety   . Arthritis   . Asthma   . Constipation   . Depression   . Gallbladder problem   . Hematuria 08/25/2015  . Hyperlipidemia   . Hypertension   . Obesity   . Osteoarthritis   . Pelvic pain in female 08/25/2015  . Rheumatoid arthritis ([place])   . Stroke (cerebrum) ([place])   . Swallowing difficulty   . Swelling     PAST SURGICAL HISTORY: Past Surgical History:  Procedure Laterality Date  . BACK SURGERY    . CHOLECYSTECTOMY    .  COLONOSCOPY  2007   sessile sigmoid polyp x 1; path: serrated adenoma  . COLONOSCOPY N/A 10/12/2014   Procedure: COLONOSCOPY;  Surgeon: [name], MD;  Location: [facility];  Service: Endoscopy;  Laterality: N/A;  . JOINT REPLACEMENT     BIL knee   . KNEE ARTHROPLASTY    . KNEE SURGERY     bilateral knee replacement  . LIPOMA EXCISION     x 2  . TONSILLECTOMY AND ADENOIDECTOMY      SOCIAL HISTORY: Social History   Tobacco Use  . Smoking status: Never Smoker  . Smokeless tobacco: Never Used  Substance Use Topics  . Alcohol use: Yes    Alcohol/week: 0.0 standard drinks    Comment: sometimes; rarely  . Drug use: No    FAMILY HISTORY: Family History  Problem Relation Age of Onset  . Heart attack Father   . Alcoholism Father   . Depression Father   . Heart disease Father   .  Hyperlipidemia Father   . Hypertension Father   . Leukemia Mother   . Hypertension Mother   . Cancer Mother   . Depression Mother   . Obesity Sister   . Other Sister        breathing problems  . Hepatitis C Sister   . Arthritis Sister   . Emphysema Brother   . Obesity Daughter   . Polycystic ovary syndrome Daughter   . Obesity Son   . Other Son        knee problems  . Cancer Maternal Grandmother        breast  . Other Brother        aneursym  . Arthritis Sister   . Obesity Son   . Asthma Son   . Obesity Son   . Diabetes Other        runs on dad's side of the family  . Colon cancer Neg Hx        states she doesn't know a lot about her family, but has never heard of specific colon ca    ROS: Review of Systems  Constitutional: Positive for malaise/fatigue and weight loss.  Cardiovascular: Negative for chest pain.       Negative chest pressure  Gastrointestinal: Negative for nausea and vomiting.  Musculoskeletal:       Negative muscle weakness  Neurological: Negative for headaches.    PHYSICAL EXAM: Pt in no acute distress  RECENT LABS AND TESTS: BMET    Component  Value Date/Time   NA 139 10/30/2018 1422   NA 142 04/05/2018 1119   K 4.6 10/30/2018 1422   CL 104 10/30/2018 1422   CO2 26 10/30/2018 1422   GLUCOSE 89 10/30/2018 1422   BUN 15 10/30/2018 1422   BUN 11 04/05/2018 1119   CREATININE 0.95 10/30/2018 1422   CALCIUM 10.6 (H) 10/30/2018 1422   GFRNONAA 62 10/30/2018 1422   GFRAA 72 10/30/2018 1422   Lab Results  Component Value Date   HGBA1C 5.5 10/31/2018   Lab Results  Component Value Date   INSULIN 12.3 10/31/2018   CBC    Component Value Date/Time   WBC 6.0 10/30/2018 1422   RBC 4.65 10/30/2018 1422   HGB 12.7 10/30/2018 1422   HGB 11.9 04/05/2018 1119   HCT 38.9 10/30/2018 1422   HCT 37.5 04/05/2018 1119   PLT 227 10/30/2018 1422   PLT 235 04/05/2018 1119   MCV 83.7 10/30/2018 1422   MCV 88 04/05/2018 1119   MCH 27.3 10/30/2018 1422   MCHC 32.6 10/30/2018 1422   RDW 14.9 10/30/2018 1422   RDW 18.4 (H) 04/05/2018 1119   LYMPHSABS 1,704 10/30/2018 1422   LYMPHSABS 1.0 04/05/2018 1119   MONOABS 924 03/06/2017 0923   EOSABS 108 10/30/2018 1422   EOSABS 0.1 04/05/2018 1119   BASOSABS 30 10/30/2018 1422   BASOSABS 0.0 04/05/2018 1119   Iron/TIBC/Ferritin/ %Sat No results found for: IRON, TIBC, FERRITIN, IRONPCTSAT Lipid Panel     Component Value Date/Time   CHOL 203 (H) 10/31/2018 1253   TRIG 49 10/31/2018 1253   HDL 55 10/31/2018 1253   CHOLHDL 3.8 11/06/2017 1041   CHOLHDL 6.6 07/09/2014 0819   VLDL 13 07/09/2014 0819   LDLCALC 138 (H) 10/31/2018 1253   Hepatic Function Panel     Component Value Date/Time   PROT 7.0 10/30/2018 1422   PROT 6.5 04/05/2018 1119   ALBUMIN 4.1 04/05/2018 1119   AST 19 10/30/2018 1422   ALT  12 10/30/2018 1422   ALKPHOS 91 04/05/2018 1119   BILITOT 0.6 10/30/2018 1422   BILITOT 0.6 04/05/2018 1119   BILIDIR 0.15 12/20/2016 0857   IBILI 0.4 07/09/2014 0819      Component Value Date/Time   TSH 1.180 10/31/2018 1253   TSH 0.67 03/06/2017 0923   TSH 1.899 03/18/2009 0000       I, Trixie Dredge, am acting as transcriptionist for Ilene Qua, MD  I have reviewed the above documentation for accuracy and completeness, and I agree with the above. - Ilene Qua, MD

## 2019-01-06 ENCOUNTER — Other Ambulatory Visit: Payer: Self-pay

## 2019-01-06 ENCOUNTER — Encounter (HOSPITAL_COMMUNITY): Payer: Self-pay | Admitting: Psychiatry

## 2019-01-06 ENCOUNTER — Ambulatory Visit (INDEPENDENT_AMBULATORY_CARE_PROVIDER_SITE_OTHER): Payer: PPO | Admitting: Psychiatry

## 2019-01-06 DIAGNOSIS — F331 Major depressive disorder, recurrent, moderate: Secondary | ICD-10-CM
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
## 2019-01-07 ENCOUNTER — Other Ambulatory Visit: Payer: Self-pay

## 2019-01-07 NOTE — Patient Outreach (Signed)
[facility]) Care Management  01/07/2019  [name] [birth year] [id number]  Patient screened for HTA Health Risk Assessment.  Requested Advance Directive documentation to be mailed. Advance Directive packet and EMMI mailed today. Will follow up within the next month to ensure receipt.   Ronn Melena, BSW Social Worker [phone]

## 2019-01-09 DIAGNOSIS — H18413 Arcus senilis, bilateral: Secondary | ICD-10-CM | POA: Diagnosis not present

## 2019-01-09 DIAGNOSIS — H25043 Posterior subcapsular polar age-related cataract, bilateral: Secondary | ICD-10-CM | POA: Diagnosis not present

## 2019-01-09 DIAGNOSIS — H2513 Age-related nuclear cataract, bilateral: Secondary | ICD-10-CM | POA: Diagnosis not present

## 2019-01-09 DIAGNOSIS — H2512 Age-related nuclear cataract, left eye: Secondary | ICD-10-CM | POA: Diagnosis not present

## 2019-01-09 DIAGNOSIS — H25013 Cortical age-related cataract, bilateral: Secondary | ICD-10-CM | POA: Diagnosis not present

## 2019-01-13 DIAGNOSIS — H2512 Age-related nuclear cataract, left eye: Secondary | ICD-10-CM | POA: Diagnosis not present

## 2019-01-14 DIAGNOSIS — H2511 Age-related nuclear cataract, right eye: Secondary | ICD-10-CM | POA: Diagnosis not present

## 2019-01-15 ENCOUNTER — Other Ambulatory Visit: Payer: Self-pay

## 2019-01-15 ENCOUNTER — Ambulatory Visit (INDEPENDENT_AMBULATORY_CARE_PROVIDER_SITE_OTHER): Payer: PPO | Admitting: Psychiatry

## 2019-01-15 DIAGNOSIS — F331 Major depressive disorder, recurrent, moderate: Secondary | ICD-10-CM
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
## 2019-01-20 NOTE — Progress Notes (Deleted)
Office Visit Note  Patient: [name]             Date of Birth: June 06, 1951           MRN: [id number]             PCP: [name], MD Referring: [name], MD Visit Date: 01/30/2019 Occupation: @GUAROCC @  Subjective:  No chief complaint on file.   History of Present Illness: [name] is a 68 y.o. female ***   Activities of Daily Living:  Patient reports morning stiffness for *** {minute/hour:19697}.   Patient {ACTIONS;DENIES/REPORTS:21021675::"Denies"} nocturnal pain.  Difficulty dressing/grooming: {ACTIONS;DENIES/REPORTS:21021675::"Denies"} Difficulty climbing stairs: {ACTIONS;DENIES/REPORTS:21021675::"Denies"} Difficulty getting out of chair: {ACTIONS;DENIES/REPORTS:21021675::"Denies"} Difficulty using hands for taps, buttons, cutlery, and/or writing: {ACTIONS;DENIES/REPORTS:21021675::"Denies"}  No Rheumatology ROS completed.   PMFS History:  Patient Active Problem List   Diagnosis Date Noted  . History of total knee replacement, bilateral 10/30/2018  . History of MRSA infection 04/04/2017  . High risk medication use 03/02/2017  . Hypercalcemia 03/02/2017  . Pelvic pain in female 08/25/2015  . Hematuria 08/25/2015  . History of adenomatous polyp of colon 10/01/2014  . Seropositive rheumatoid arthritis of multiple sites ([place]) 07/20/2014  . History of juvenile rheumatoid arthritis 07/20/2014  . Impaired fasting glucose 07/20/2014  . NEOPLASM, SKIN, UNCERTAIN BEHAVIOR 94/49/6759  . Unilateral primary osteoarthritis, right knee 09/23/2008  . CALF PAIN, RIGHT 09/23/2008  . ANXIETY DEPRESSION 08/07/2008  . OBESITY 12/19/2007  . DDD (degenerative disc disease), lumbar 06/27/2007  . Osteopenia 06/27/2007  . HIP PAIN, LEFT 06/18/2007  . CONSTIPATION 05/16/2007  . Essential hypertension 04/04/2007  . Psoriasis 03/07/2007  . OSTEOARTHROSIS, GENERALIZED, MULTIPLE SITES 03/07/2007  . ANOSMIA 03/07/2007  . LIPOMA 02/21/2007  . Hyperlipidemia LDL  goal <130 02/21/2007  . ALLERGIC RHINITIS 02/21/2007  . ASTHMA 02/21/2007  . ARTHRITIS 02/21/2007  . KNEE PAIN, LEFT 02/21/2007  . LOW BACK PAIN, CHRONIC 02/21/2007    Past Medical History:  Diagnosis Date  . Allergic rhinitis   . Anxiety   . Arthritis   . Asthma   . Constipation   . Depression   . Gallbladder problem   . Hematuria 08/25/2015  . Hyperlipidemia   . Hypertension   . Obesity   . Osteoarthritis   . Pelvic pain in female 08/25/2015  . Rheumatoid arthritis ([place])   . Stroke (cerebrum) ([place])   . Swallowing difficulty   . Swelling     Family History  Problem Relation Age of Onset  . Heart attack Father   . Alcoholism Father   . Depression Father   . Heart disease Father   . Hyperlipidemia Father   . Hypertension Father   . Leukemia Mother   . Hypertension Mother   . Cancer Mother   . Depression Mother   . Obesity Sister   . Other Sister        breathing problems  . Hepatitis C Sister   . Arthritis Sister   . Depression Sister   . Emphysema Brother   . Obesity Daughter   . Polycystic ovary syndrome Daughter   . Obesity Son   . Other Son        knee problems  . Cancer Maternal Grandmother        breast  . Other Brother        aneursym  . Arthritis Sister   . Depression Sister   . Obesity Son   . Asthma Son   . Obesity Son   .  Diabetes Other        runs on dad's side of the family  . Colon cancer Neg Hx        states she doesn't know a lot about her family, but has never heard of specific colon ca   Past Surgical History:  Procedure Laterality Date  . BACK SURGERY    . CHOLECYSTECTOMY    . COLONOSCOPY  2007   sessile sigmoid polyp x 1; path: serrated adenoma  . COLONOSCOPY N/A 10/12/2014   Procedure: COLONOSCOPY;  Surgeon: [name], MD;  Location: [facility];  Service: Endoscopy;  Laterality: N/A;  . JOINT REPLACEMENT     BIL knee   . KNEE ARTHROPLASTY    . KNEE SURGERY     bilateral knee replacement  . LIPOMA EXCISION     x 2   . TONSILLECTOMY AND ADENOIDECTOMY     Social History   Social History Narrative  . Not on file   Immunization History  Administered Date(s) Administered  . Influenza Split 06/17/2013  . Influenza Whole 06/27/2007, 05/08/2008  . Influenza, High Dose Seasonal PF 06/14/2017, 06/18/2018  . Influenza,inj,Quad PF,6+ Mos 07/12/2015  . Influenza-Unspecified 06/01/2016, 06/14/2017, 06/18/2018  . Pneumococcal Polysaccharide-23 02/18/1993, 06/27/2007  . Zoster Recombinat (Shingrix) 08/19/2018     Objective: Vital Signs: There were no vitals taken for this visit.   Physical Exam   Musculoskeletal Exam: ***  CDAI Exam: CDAI Score: Not documented Patient Global Assessment: Not documented; Provider Global Assessment: Not documented Swollen: Not documented; Tender: Not documented Joint Exam   Not documented   There is currently no information documented on the homunculus. Go to the Rheumatology activity and complete the homunculus joint exam.  Investigation: No additional findings.  Imaging: No results found.  Recent Labs: Lab Results  Component Value Date   WBC 6.0 10/30/2018   HGB 12.7 10/30/2018   PLT 227 10/30/2018   NA 139 10/30/2018   K 4.6 10/30/2018   CL 104 10/30/2018   CO2 26 10/30/2018   GLUCOSE 89 10/30/2018   BUN 15 10/30/2018   CREATININE 0.95 10/30/2018   BILITOT 0.6 10/30/2018   ALKPHOS 91 04/05/2018   AST 19 10/30/2018   ALT 12 10/30/2018   PROT 7.0 10/30/2018   ALBUMIN 4.1 04/05/2018   CALCIUM 10.6 (H) 10/30/2018   GFRAA 72 10/30/2018    Speciality Comments: no baseline PLQ eye exam  Procedures:  No procedures performed Allergies: Codeine and Cyclosporine   Assessment / Plan:     Visit Diagnoses: No diagnosis found.   Orders: No orders of the defined types were placed in this encounter.  No orders of the defined types were placed in this encounter.   Face-to-face time spent with patient was *** minutes. Greater than 50% of time was  spent in counseling and coordination of care.  Follow-Up Instructions: No follow-ups on file.   Earnestine Mealing, CMA  Note - This record has been created using Editor, commissioning.  Chart creation errors have been sought, but may not always  have been located. Such creation errors do not reflect on  the standard of medical care.

## 2019-01-21 ENCOUNTER — Telehealth: Payer: Self-pay | Admitting: Rheumatology

## 2019-01-21 DIAGNOSIS — Z79899 Other long term (current) drug therapy: Secondary | ICD-10-CM

## 2019-01-21 NOTE — Telephone Encounter (Signed)
Patient called requesting [facility] orders be sent to [facility] in [place].  Patient requested a return call.

## 2019-01-21 NOTE — Telephone Encounter (Signed)
Labs orders released.  

## 2019-01-22 ENCOUNTER — Other Ambulatory Visit: Payer: Self-pay | Admitting: Rheumatology

## 2019-01-22 DIAGNOSIS — M0579 Rheumatoid arthritis with rheumatoid factor of multiple sites without organ or systems involvement: Secondary | ICD-10-CM

## 2019-01-22 NOTE — Telephone Encounter (Signed)
Last Visit: 10/30/18 Next Visit: 01/30/19 Labs: 10/30/18 Calcium is elevated-10.6.Rest of lab work is WNL. No baseline PLQ Eye exam (started on PLQ in March 2020)  Left message for patient advising we need PLQ eye exam.   Okay to refill 30 day supply PLQ?

## 2019-01-22 NOTE — Telephone Encounter (Signed)
ok 

## 2019-01-23 ENCOUNTER — Other Ambulatory Visit (INDEPENDENT_AMBULATORY_CARE_PROVIDER_SITE_OTHER): Payer: Self-pay | Admitting: Family Medicine

## 2019-01-23 DIAGNOSIS — E559 Vitamin D deficiency, unspecified: Secondary | ICD-10-CM

## 2019-01-24 NOTE — Progress Notes (Deleted)
Office Visit Note  Patient: [name]             Date of Birth: [birth year]           MRN: [id number]             PCP: [name], MD Referring: [name], MD Visit Date: 02/07/2019 Occupation: @GUAROCC @  Subjective:  No chief complaint on file.   History of Present Illness: [name] is a 68 y.o. female ***   Activities of Daily Living:  Patient reports morning stiffness for *** {minute/hour:19697}.   Patient {ACTIONS;DENIES/REPORTS:21021675::"Denies"} nocturnal pain.  Difficulty dressing/grooming: {ACTIONS;DENIES/REPORTS:21021675::"Denies"} Difficulty climbing stairs: {ACTIONS;DENIES/REPORTS:21021675::"Denies"} Difficulty getting out of chair: {ACTIONS;DENIES/REPORTS:21021675::"Denies"} Difficulty using hands for taps, buttons, cutlery, and/or writing: {ACTIONS;DENIES/REPORTS:21021675::"Denies"}  No Rheumatology ROS completed.   PMFS History:  Patient Active Problem List   Diagnosis Date Noted  . History of total knee replacement, bilateral 10/30/2018  . History of MRSA infection 04/04/2017  . High risk medication use 03/02/2017  . Hypercalcemia 03/02/2017  . Pelvic pain in female 08/25/2015  . Hematuria 08/25/2015  . History of adenomatous polyp of colon 10/01/2014  . Seropositive rheumatoid arthritis of multiple sites ([place]) 07/20/2014  . History of juvenile rheumatoid arthritis 07/20/2014  . Impaired fasting glucose 07/20/2014  . NEOPLASM, SKIN, UNCERTAIN BEHAVIOR 16/02/3709  . Unilateral primary osteoarthritis, right knee 09/23/2008  . CALF PAIN, RIGHT 09/23/2008  . ANXIETY DEPRESSION 08/07/2008  . OBESITY 12/19/2007  . DDD (degenerative disc disease), lumbar 06/27/2007  . Osteopenia 06/27/2007  . HIP PAIN, LEFT 06/18/2007  . CONSTIPATION 05/16/2007  . Essential hypertension 04/04/2007  . Psoriasis 03/07/2007  . OSTEOARTHROSIS, GENERALIZED, MULTIPLE SITES 03/07/2007  . ANOSMIA 03/07/2007  . LIPOMA 02/21/2007  . Hyperlipidemia LDL  goal <130 02/21/2007  . ALLERGIC RHINITIS 02/21/2007  . ASTHMA 02/21/2007  . ARTHRITIS 02/21/2007  . KNEE PAIN, LEFT 02/21/2007  . LOW BACK PAIN, CHRONIC 02/21/2007    Past Medical History:  Diagnosis Date  . Allergic rhinitis   . Anxiety   . Arthritis   . Asthma   . Constipation   . Depression   . Gallbladder problem   . Hematuria 08/25/2015  . Hyperlipidemia   . Hypertension   . Obesity   . Osteoarthritis   . Pelvic pain in female 08/25/2015  . Rheumatoid arthritis ([place])   . Stroke (cerebrum) ([place])   . Swallowing difficulty   . Swelling     Family History  Problem Relation Age of Onset  . Heart attack Father   . Alcoholism Father   . Depression Father   . Heart disease Father   . Hyperlipidemia Father   . Hypertension Father   . Leukemia Mother   . Hypertension Mother   . Cancer Mother   . Depression Mother   . Obesity Sister   . Other Sister        breathing problems  . Hepatitis C Sister   . Arthritis Sister   . Depression Sister   . Emphysema Brother   . Obesity Daughter   . Polycystic ovary syndrome Daughter   . Obesity Son   . Other Son        knee problems  . Cancer Maternal Grandmother        breast  . Other Brother        aneursym  . Arthritis Sister   . Depression Sister   . Obesity Son   . Asthma Son   . Obesity Son   .  Diabetes Other        runs on dad's side of the family  . Colon cancer Neg Hx        states she doesn't know a lot about her family, but has never heard of specific colon ca   Past Surgical History:  Procedure Laterality Date  . BACK SURGERY    . CHOLECYSTECTOMY    . COLONOSCOPY  2007   sessile sigmoid polyp x 1; path: serrated adenoma  . COLONOSCOPY N/A 10/12/2014   Procedure: COLONOSCOPY;  Surgeon: [name], MD;  [facility];  Service: Endoscopy;  Laterality: N/A;  . JOINT REPLACEMENT     BIL knee   . KNEE ARTHROPLASTY    . KNEE SURGERY     bilateral knee replacement  . LIPOMA EXCISION     x 2   . TONSILLECTOMY AND ADENOIDECTOMY     Social History   Social History Narrative  . Not on file   Immunization History  Administered Date(s) Administered  . Influenza Split 06/17/2013  . Influenza Whole 06/27/2007, 05/08/2008  . Influenza, High Dose Seasonal PF 06/14/2017, 06/18/2018  . Influenza,inj,Quad PF,6+ Mos 07/12/2015  . Influenza-Unspecified 06/01/2016, 06/14/2017, 06/18/2018  . Pneumococcal Polysaccharide-23 02/18/1993, 06/27/2007  . Zoster Recombinat (Shingrix) 08/19/2018     Objective: Vital Signs: There were no vitals taken for this visit.   Physical Exam   Musculoskeletal Exam: ***  CDAI Exam: CDAI Score: Not documented Patient Global Assessment: Not documented; Provider Global Assessment: Not documented Swollen: Not documented; Tender: Not documented Joint Exam   Not documented   There is currently no information documented on the homunculus. Go to the Rheumatology activity and complete the homunculus joint exam.  Investigation: No additional findings.  Imaging: No results found.  Recent Labs: Lab Results  Component Value Date   WBC 6.0 10/30/2018   HGB 12.7 10/30/2018   PLT 227 10/30/2018   NA 139 10/30/2018   K 4.6 10/30/2018   CL 104 10/30/2018   CO2 26 10/30/2018   GLUCOSE 89 10/30/2018   BUN 15 10/30/2018   CREATININE 0.95 10/30/2018   BILITOT 0.6 10/30/2018   ALKPHOS 91 04/05/2018   AST 19 10/30/2018   ALT 12 10/30/2018   PROT 7.0 10/30/2018   ALBUMIN 4.1 04/05/2018   CALCIUM 10.6 (H) 10/30/2018   GFRAA 72 10/30/2018    Speciality Comments: no baseline PLQ eye exam  Procedures:  No procedures performed Allergies: Codeine and Cyclosporine   Assessment / Plan:     Visit Diagnoses: No diagnosis found.   Orders: No orders of the defined types were placed in this encounter.  No orders of the defined types were placed in this encounter.   Face-to-face time spent with patient was *** minutes. Greater than 50% of time was  spent in counseling and coordination of care.  Follow-Up Instructions: No follow-ups on file.   Ofilia Neas, PA-C  Note - This record has been created using Dragon software.  Chart creation errors have been sought, but may not always  have been located. Such creation errors do not reflect on  the standard of medical care.

## 2019-01-28 ENCOUNTER — Ambulatory Visit (HOSPITAL_COMMUNITY): Payer: PPO | Admitting: Psychiatry

## 2019-01-30 ENCOUNTER — Encounter: Payer: Self-pay | Admitting: Family Medicine

## 2019-01-30 ENCOUNTER — Ambulatory Visit (INDEPENDENT_AMBULATORY_CARE_PROVIDER_SITE_OTHER): Payer: PPO | Admitting: Family Medicine

## 2019-01-30 ENCOUNTER — Other Ambulatory Visit: Payer: Self-pay

## 2019-01-30 ENCOUNTER — Ambulatory Visit: Payer: Self-pay | Admitting: Physician Assistant

## 2019-01-30 ENCOUNTER — Encounter (INDEPENDENT_AMBULATORY_CARE_PROVIDER_SITE_OTHER): Payer: Self-pay | Admitting: Family Medicine

## 2019-01-30 DIAGNOSIS — M79671 Pain in right foot: Secondary | ICD-10-CM | POA: Diagnosis not present

## 2019-01-30 DIAGNOSIS — E559 Vitamin D deficiency, unspecified: Secondary | ICD-10-CM | POA: Diagnosis not present

## 2019-01-30 DIAGNOSIS — I10 Essential (primary) hypertension: Secondary | ICD-10-CM | POA: Diagnosis not present

## 2019-01-30 DIAGNOSIS — Z6841 Body Mass Index (BMI) 40.0 and over, adult: Secondary | ICD-10-CM | POA: Diagnosis not present

## 2019-01-30 NOTE — Telephone Encounter (Signed)
Pt contacted and transferred up front to set up virtual visit today.

## 2019-01-30 NOTE — Progress Notes (Signed)
   Subjective:    Patient ID: [name], female    DOB: Nov 28, 1950, 68 y.o.   MRN: [id number] Audio only Foot Pain This is a new problem. Episode onset: 4 days. She has tried acetaminophen, ice and rest for the symptoms.  happened while walking the dogs. Heard a click in her foot and foot started burning and hurting. Has to use cane to walk.   Virtual Visit via Telephone Note  I connected with [name] on 01/30/19 at  3:50 PM EDT by telephone and verified that I am speaking with the correct person using two identifiers.  Location: Patient: home Provider: office   I discussed the limitations, risks, security and privacy concerns of performing an evaluation and management service by telephone and the availability of in person appointments. I also discussed with the patient that there may be a patient responsible charge related to this service. The patient expressed understanding and agreed to proceed.   History of Present Illness:    Observations/Objective:   Assessment and Plan:   Follow Up Instructions:    I discussed the assessment and treatment plan with the patient. The patient was provided an opportunity to ask questions and all were answered. The patient agreed with the plan and demonstrated an understanding of the instructions.   The patient was advised to call back or seek an in-person evaluation if the symptoms worsen or if the condition fails to improve as anticipated.  I provided 18 minutes of non-face-to-face time during this encounter. Substantial pain in foot.  Sudden onset.  Occurred when walking Patient did recall a sudden clicking sensation.  Now limping.  Now substantial pain.    Review of Systems No headache, no major weight loss or weight gain, no chest pain no back pain abdominal pain no change in bowel habits complete ROS otherwise negative     Objective:   Physical Exam  Virtual      Assessment & Plan:  Impression foot injury.   X-ray warranted.  Patient advised stress test sometimes can had negative regular x-ray.  Rationale discussed proper cane use discussed addendum x-ray negative for acute injury call midweek next week if still not substantially improved

## 2019-01-30 NOTE — Progress Notes (Signed)
Office: [phone]  /  Fax: [phone] TeleHealth Visit:  [name] has verbally consented to this TeleHealth visit today. The patient is located at home, the provider is located at the [facility] office. The participants in this visit include the listed provider and patient and patient's husband. The visit was conducted today via face time.  HPI:   Chief Complaint: OBESITY [name] is here to discuss her progress with her obesity treatment plan. She is on the Category 2 plan or follow our protein rich vegetarian plan and is following her eating plan approximately 0 % of the time (unsure). She states she is walking for 10 minutes 7 times per week. [name] had cataract surgery 2 weeks ago on her left eye, and is getting her right eye done next week. Her weight is of 251 lbs today. She didn't enjoy the vegetarian plan as much as she'd like. She is eating off the plan, especially when she doesn't feel like eating. she is monitoring protein intake and going for a goal of 80 grams.  We were unable to weigh the patient today for this TeleHealth visit. She feels as if she has lost 2 lbs since her last visit. She has lost 0 lbs since starting treatment with [place].  Vitamin D Deficiency [name] has a diagnosis of vitamin D deficiency. She is currently taking prescription Vit D. She notes fatigue and denies nausea, vomiting or muscle weakness.  Hypertension [name] is a 68 y.o. female with hypertension. Marciel's blood pressure is controlled (at home blood pressure reading is 100's-140/70's). She denies hypotension denies or chest pain. Her average blood pressure systolic around 381. She is working on weight loss to help control her blood pressure with the goal of decreasing her risk of heart attack and stroke.   ASSESSMENT AND PLAN:  Vitamin D deficiency - Plan: Vitamin D, Ergocalciferol, (DRISDOL) 1.25 MG (50000 UT) CAPS capsule  Essential hypertension  Class 3 severe  obesity with serious comorbidity and body mass index (BMI) of 45.0 to 49.9 in adult, unspecified obesity type ([place])  PLAN:  Vitamin D Deficiency [name] was informed that low vitamin D levels contributes to fatigue and are associated with obesity, breast, and colon cancer. [name] agrees to continue taking prescription Vit D @50 ,000 IU every week #12 with no refills. She will follow up for routine testing of vitamin D, at least 2-3 times per year. She was informed of the risk of over-replacement of vitamin D and agrees to not increase her dose unless she discusses this with [place] first. [name] agrees to follow up with our clinic in 2 weeks.  Hypertension We discussed sodium restriction, working on healthy weight loss, and a regular exercise program as the means to achieve improved blood pressure control. [name] agreed with this plan and agreed to follow up as directed. We will continue to monitor her blood pressure as well as her progress with the above lifestyle modifications. [name] agrees to continue her medications and will watch for signs of hypotension as she continues her lifestyle modifications. [name] agrees to follow up with our clinic in 2 weeks.  Obesity Devra is currently in the action stage of change. As such, her goal is to continue with weight loss efforts She has agreed to portion control better and make smarter food choices, such as increase vegetables and decrease simple carbohydrates or follow the Category 2 plan [name] has been instructed to work up to a goal of 150 minutes of combined  cardio and strengthening exercise per week for weight loss and overall health benefits. We discussed the following Behavioral Modification Strategies today: increasing lean protein intake, increasing vegetables and work on meal planning and easy cooking plans, keeping healthy foods in the home, better snacking choices, and planning for success   [name] has agreed to follow up with our clinic  in 2 weeks. She was informed of the importance of frequent follow up visits to maximize her success with intensive lifestyle modifications for her multiple health conditions.  ALLERGIES: Allergies  Allergen Reactions  . Codeine     REACTION: Nausea  . Cyclosporine Swelling    MEDICATIONS: Current Outpatient Medications on File Prior to Visit  Medication Sig Dispense Refill  . acetaminophen (TYLENOL) 500 MG tablet Take 500 mg by mouth every 8 (eight) hours as needed (pain).     [name] atorvastatin (LIPITOR) 40 MG tablet TAKE 1 TABLET DAILY 90 tablet 1  . chlorzoxazone (PARAFON) 500 MG tablet Take by mouth 4 (four) times daily as needed for muscle spasms.    . cimetidine (TAGAMET) 200 MG tablet Take 200 mg by mouth 2 (two) times daily.    [name] HYDROcodone-acetaminophen (NORCO/VICODIN) 5-325 MG tablet Take one tablet by mouth every 6 hours as needed for pain 16 tablet 0  . hydroxychloroquine (PLAQUENIL) 200 MG tablet TAKE 1 TABLET BY MOUTH TWICE A DAY 60 tablet 0  . lisinopril (PRINIVIL,ZESTRIL) 10 MG tablet TAKE 1 TABLET DAILY 90 tablet 1  . OVER THE COUNTER MEDICATION daily.     . Vitamin D, Ergocalciferol, (DRISDOL) 1.25 MG (50000 UT) CAPS capsule Take 1 capsule (50,000 Units total) by mouth every 7 (seven) days. 4 capsule 0   Current Facility-Administered Medications on File Prior to Visit  Medication Dose Route Frequency Provider Last Rate Last Dose  . methylPREDNISolone acetate (DEPO-MEDROL) injection 40 mg  40 mg Intra-articular Once Catherine Kirschner, MD        PAST MEDICAL HISTORY: Past Medical History:  Diagnosis Date  . Allergic rhinitis   . Anxiety   . Arthritis   . Asthma   . Constipation   . Depression   . Gallbladder problem   . Hematuria 08/25/2015  . Hyperlipidemia   . Hypertension   . Obesity   . Osteoarthritis   . Pelvic pain in female 08/25/2015  . Rheumatoid arthritis ([name])   . Swallowing difficulty   . Swelling     PAST SURGICAL HISTORY: Past Surgical History:   Procedure Laterality Date  . BACK SURGERY    . CHOLECYSTECTOMY    . COLONOSCOPY  2007   sessile sigmoid polyp x 1; path: serrated adenoma  . COLONOSCOPY N/A 10/12/2014   Procedure: COLONOSCOPY;  Surgeon: [name], MD;  Location[facility];  Service: Endoscopy;  Laterality: N/A;  . JOINT REPLACEMENT     BIL knee   . KNEE ARTHROPLASTY    . KNEE SURGERY     bilateral knee replacement  . LIPOMA EXCISION     x 2  . TONSILLECTOMY AND ADENOIDECTOMY      SOCIAL HISTORY: Social History   Tobacco Use  . Smoking status: Never Smoker  . Smokeless tobacco: Never Used  Substance Use Topics  . Alcohol use: Yes    Alcohol/week: 0.0 standard drinks    Comment: sometimes; rarely  . Drug use: No    FAMILY HISTORY: Family History  Problem Relation Age of Onset  . Heart attack Father   . Alcoholism Father   .  Depression Father   . Heart disease Father   . Hyperlipidemia Father   . Hypertension Father   . Leukemia Mother   . Hypertension Mother   . Cancer Mother   . Depression Mother   . Obesity Sister   . Other Sister        breathing problems  . Hepatitis C Sister   . Arthritis Sister   . Depression Sister   . Emphysema Brother   . Obesity Daughter   . Polycystic ovary syndrome Daughter   . Obesity Son   . Other Son        knee problems  . Cancer Maternal Grandmother        breast  . Other Brother        aneursym  . Arthritis Sister   . Depression Sister   . Obesity Son   . Asthma Son   . Obesity Son   . Diabetes Other        runs on dad's side of the family  . Colon cancer Neg Hx        states she doesn't know a lot about her family, but has never heard of specific colon ca    ROS: Review of Systems  Constitutional: Positive for malaise/fatigue and weight loss.  Cardiovascular: Negative for chest pain.  Gastrointestinal: Negative for nausea and vomiting.  Musculoskeletal:       Negative muscle weakness    PHYSICAL EXAM: Pt in no acute distress   RECENT LABS AND TESTS: BMET    Component Value Date/Time   NA 139 10/30/2018 1422   NA 142 04/05/2018 1119   K 4.6 10/30/2018 1422   CL 104 10/30/2018 1422   CO2 26 10/30/2018 1422   GLUCOSE 89 10/30/2018 1422   BUN 15 10/30/2018 1422   BUN 11 04/05/2018 1119   CREATININE 0.95 10/30/2018 1422   CALCIUM 10.6 (H) 10/30/2018 1422   GFRNONAA 62 10/30/2018 1422   GFRAA 72 10/30/2018 1422   Lab Results  Component Value Date   HGBA1C 5.5 10/31/2018   Lab Results  Component Value Date   INSULIN 12.3 10/31/2018   CBC    Component Value Date/Time   WBC 6.0 10/30/2018 1422   RBC 4.65 10/30/2018 1422   HGB 12.7 10/30/2018 1422   HGB 11.9 04/05/2018 1119   HCT 38.9 10/30/2018 1422   HCT 37.5 04/05/2018 1119   PLT 227 10/30/2018 1422   PLT 235 04/05/2018 1119   MCV 83.7 10/30/2018 1422   MCV 88 04/05/2018 1119   MCH 27.3 10/30/2018 1422   MCHC 32.6 10/30/2018 1422   RDW 14.9 10/30/2018 1422   RDW 18.4 (H) 04/05/2018 1119   LYMPHSABS 1,704 10/30/2018 1422   LYMPHSABS 1.0 04/05/2018 1119   MONOABS 924 03/06/2017 0923   EOSABS 108 10/30/2018 1422   EOSABS 0.1 04/05/2018 1119   BASOSABS 30 10/30/2018 1422   BASOSABS 0.0 04/05/2018 1119   Iron/TIBC/Ferritin/ %Sat No results found for: IRON, TIBC, FERRITIN, IRONPCTSAT Lipid Panel     Component Value Date/Time   CHOL 203 (H) 10/31/2018 1253   TRIG 49 10/31/2018 1253   HDL 55 10/31/2018 1253   CHOLHDL 3.8 11/06/2017 1041   CHOLHDL 6.6 07/09/2014 0819   VLDL 13 07/09/2014 0819   LDLCALC 138 (H) 10/31/2018 1253   Hepatic Function Panel     Component Value Date/Time   PROT 7.0 10/30/2018 1422   PROT 6.5 04/05/2018 1119   ALBUMIN 4.1 04/05/2018 1119  AST 19 10/30/2018 1422   ALT 12 10/30/2018 1422   ALKPHOS 91 04/05/2018 1119   BILITOT 0.6 10/30/2018 1422   BILITOT 0.6 04/05/2018 1119   BILIDIR 0.15 12/20/2016 0857   IBILI 0.4 07/09/2014 0819      Component Value Date/Time   TSH 1.180 10/31/2018 1253   TSH 0.67  03/06/2017 0923   TSH 1.899 03/18/2009 0000      I, Trixie Dredge, am acting as transcriptionist for Ilene Qua, MD   I have reviewed the above documentation for accuracy and completeness, and I agree with the above. - Ilene Qua, MD

## 2019-01-31 ENCOUNTER — Ambulatory Visit (HOSPITAL_COMMUNITY)
Admission: RE | Admit: 2019-01-31 | Discharge: 2019-01-31 | Disposition: A | Discharge status: Home / Self Care | Payer: PPO | Source: Ambulatory Visit | Attending: Family Medicine | Admitting: Family Medicine

## 2019-01-31 DIAGNOSIS — M79671 Pain in right foot: Secondary | ICD-10-CM | POA: Diagnosis not present

## 2019-01-31 DIAGNOSIS — S99921A Unspecified injury of right foot, initial encounter: Secondary | ICD-10-CM | POA: Diagnosis not present

## 2019-02-03 DIAGNOSIS — H2511 Age-related nuclear cataract, right eye: Secondary | ICD-10-CM | POA: Diagnosis not present

## 2019-02-03 MED ORDER — VITAMIN D (ERGOCALCIFEROL) 1.25 MG (50000 UNIT) PO CAPS: 50000.0000 [IU] | ORAL_CAPSULE | ORAL | 0 refills | Status: DC

## 2019-02-05 ENCOUNTER — Other Ambulatory Visit: Payer: Self-pay | Admitting: Family Medicine

## 2019-02-06 ENCOUNTER — Telehealth (INDEPENDENT_AMBULATORY_CARE_PROVIDER_SITE_OTHER): Payer: PPO | Admitting: Rheumatology

## 2019-02-06 ENCOUNTER — Other Ambulatory Visit: Payer: Self-pay

## 2019-02-06 ENCOUNTER — Encounter: Payer: Self-pay | Admitting: Rheumatology

## 2019-02-06 DIAGNOSIS — Z8614 Personal history of Methicillin resistant Staphylococcus aureus infection: Secondary | ICD-10-CM

## 2019-02-06 DIAGNOSIS — G8929 Other chronic pain: Secondary | ICD-10-CM

## 2019-02-06 DIAGNOSIS — M5136 Other intervertebral disc degeneration, lumbar region: Secondary | ICD-10-CM

## 2019-02-06 DIAGNOSIS — M0579 Rheumatoid arthritis with rheumatoid factor of multiple sites without organ or systems involvement: Secondary | ICD-10-CM | POA: Diagnosis not present

## 2019-02-06 DIAGNOSIS — Z8659 Personal history of other mental and behavioral disorders: Secondary | ICD-10-CM

## 2019-02-06 DIAGNOSIS — Z8639 Personal history of other endocrine, nutritional and metabolic disease: Secondary | ICD-10-CM

## 2019-02-06 DIAGNOSIS — Z96653 Presence of artificial knee joint, bilateral: Secondary | ICD-10-CM

## 2019-02-06 DIAGNOSIS — I10 Essential (primary) hypertension: Secondary | ICD-10-CM

## 2019-02-06 DIAGNOSIS — M7061 Trochanteric bursitis, right hip: Secondary | ICD-10-CM

## 2019-02-06 DIAGNOSIS — Z8709 Personal history of other diseases of the respiratory system: Secondary | ICD-10-CM

## 2019-02-06 DIAGNOSIS — M8589 Other specified disorders of bone density and structure, multiple sites: Secondary | ICD-10-CM

## 2019-02-06 DIAGNOSIS — Z79899 Other long term (current) drug therapy: Secondary | ICD-10-CM

## 2019-02-06 MED ORDER — HYDROXYCHLOROQUINE SULFATE 200 MG PO TABS: 200.0000 mg | ORAL_TABLET | Freq: Two times a day (BID) | ORAL | 2 refills | Status: DC

## 2019-02-06 NOTE — Progress Notes (Signed)
Virtual Visit via Video Note  I connected with [name] on 02/06/19 at 12:00 PM EDT by a video enabled telemedicine application and verified that I am speaking with the correct person using two identifiers.  Location: Patient: Home Provider: Clinic  This service was conducted via virtual visit.  Both audio and visual tools were used.  The patient was located at home. I was located in my office.  Consent was obtained prior to the virtual visit and is aware of possible charges through their insurance for this visit.  The patient is an established patient.  Dr. Estanislado Pandy, MD conducted the virtual visit and Catherine Sams, PA-C acted as scribe during the service.  Office staff helped with scheduling follow up visits after the service was conducted.    I discussed the limitations of evaluation and management by telemedicine and the availability of in person appointments. The patient expressed understanding and agreed to proceed.   CC: Medication monitoring  History of Present Illness: Patient is a 68 year old female with a past medical history of seropositive rheumatoid arthritis and DDD.  She is taking Plaquenil 200 mg 1 tablet by mouth twice daily. She has not missed any doses recently.  She continues to have nausea related to PLQ. She had an ultrasound of both hands on 10/30/18 that revealed synovitis.   She has intermittent pain in both hands.  She denies any joint swelling at this time. She reports right foot pain.  She states she had a x-ray of the right foot on 01/31/19, which did not reveal any fractures. She continues to have right SI joint pain.   Review of Systems  Constitutional: Positive for malaise/fatigue. Negative for fever.  Eyes: Negative for photophobia, pain, discharge and redness.  Respiratory: Negative for cough, shortness of breath and wheezing.   Cardiovascular: Negative for chest pain and palpitations.  Gastrointestinal: Negative for blood in stool, constipation and  diarrhea.  Genitourinary: Negative for dysuria.  Musculoskeletal: Positive for back pain (Right SI joint pain ) and joint pain. Negative for myalgias and neck pain.  Skin: Negative for rash.  Neurological: Negative for dizziness and headaches.  Psychiatric/Behavioral: Negative for depression. The patient is not nervous/anxious and does not have insomnia.        Observations/Objective: Physical Exam  Constitutional: She is oriented to person, place, and time and well-developed, well-nourished, and in no distress.  HENT:  Head: Normocephalic and atraumatic.  Eyes: Conjunctivae are normal.  Pulmonary/Chest: Effort normal.  Neurological: She is alert and oriented to person, place, and time.  Psychiatric: Mood, memory, affect and judgment normal.   Patient reports morning stiffness for 45-60  minutes.   Patient reports nocturnal pain.  Difficulty dressing/grooming: Denies Difficulty climbing stairs: Reports Difficulty getting out of chair: Reports Difficulty using hands for taps, buttons, cutlery, and/or writing: Denies    Assessment and Plan: Visit Diagnoses: Seropositive rheumatoid arthritis of multiple sites ([place]) - positive RF, positive anti-CCP. Previous patient of Dr. Charlestine [name] with multiple contractures, ultrasound of both hands on 10/30/18 revealed synovitis: She has not had any recent rheumatoid arthritis flares.  She has no joint swelling at this time.  She has intermittent pain in both hands and the right foot.  She is clinically doing well on Plaquenil 200 mg 1 tablet by mouth twice daily.  She continues to have nausea which she attributes to taking PLQ.  She denies missing any doses recently.  She will continue taking Plaquenil as prescribed. A refill of PLQ will  be sent to the pharmacy.  She will follow up in 5 months.   High risk medication use -She has been off of Rasuvo 20 mg since September 2019.  She is taking PLQ 200 mg 1 tablet BID.  Last Plaquenil eye exam unknown but  patient states she had one done in [place].  CBC and CMP were drawn on 10/30/18.  She will be due to update lab work in August 2020.  Standing orders are in place.   History of bilateral knee replacement: Doing well.  She has no increased joint pain or joint swelling at this time.   DDD (degenerative disc disease), lumbar - s/p fusion: She has chronic lower back pain.    Trochanteric bursitis of right hip: She has no tenderness at this time.    Chronic right SI joint: She has been having intermittent right SI joint pain.   Other medical conditions are listed as follows:  Osteopenia of multiple sites  Essential hypertension  History of hyperlipidemia  History of MRSA infection  History of adenomatous polyp of colon  History of anxiety  History of depression  History of asthma   Follow Up Instructions: She follow up in 5 months.    I discussed the assessment and treatment plan with the patient. The patient was provided an opportunity to ask questions and all were answered. The patient agreed with the plan and demonstrated an understanding of the instructions.   The patient was advised to call back or seek an in-person evaluation if the symptoms worsen or if the condition fails to improve as anticipated.  I provided 25 minutes of non-face-to-face time during this encounter. Catherine Merino, MD   Scribed by  Catherine Sams, PA-C

## 2019-02-07 ENCOUNTER — Telehealth: Payer: Self-pay | Admitting: Rheumatology

## 2019-02-10 ENCOUNTER — Other Ambulatory Visit: Payer: Self-pay

## 2019-02-10 ENCOUNTER — Ambulatory Visit (INDEPENDENT_AMBULATORY_CARE_PROVIDER_SITE_OTHER): Payer: PPO | Admitting: Psychiatry

## 2019-02-10 ENCOUNTER — Encounter (HOSPITAL_COMMUNITY): Payer: Self-pay | Admitting: Psychiatry

## 2019-02-10 DIAGNOSIS — F331 Major depressive disorder, recurrent, moderate: Secondary | ICD-10-CM | POA: Diagnosis not present
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
## 2019-02-13 ENCOUNTER — Other Ambulatory Visit: Payer: Self-pay

## 2019-02-13 ENCOUNTER — Telehealth (INDEPENDENT_AMBULATORY_CARE_PROVIDER_SITE_OTHER): Payer: PPO | Admitting: Family Medicine

## 2019-02-13 DIAGNOSIS — M05741 Rheumatoid arthritis with rheumatoid factor of right hand without organ or systems involvement: Secondary | ICD-10-CM

## 2019-02-13 DIAGNOSIS — M79671 Pain in right foot: Secondary | ICD-10-CM | POA: Diagnosis not present

## 2019-02-13 DIAGNOSIS — M05742 Rheumatoid arthritis with rheumatoid factor of left hand without organ or systems involvement: Secondary | ICD-10-CM

## 2019-02-13 DIAGNOSIS — Z6841 Body Mass Index (BMI) 40.0 and over, adult: Secondary | ICD-10-CM | POA: Diagnosis not present

## 2019-02-17 NOTE — Progress Notes (Signed)
Office: [phone]  /  Fax: [phone] TeleHealth Visit:  [name] has verbally consented to this TeleHealth visit today. The patient is located at home, the provider is located at the [facility] office. The participants in this visit include the listed provider and patient and [name]. The visit was conducted today via Facetime.  HPI:   Chief Complaint: OBESITY [name] is here to discuss her progress with her obesity treatment plan. She is on the  follow the Category 2 plan and is following her eating plan approximately 50 % of the time. She states she is exercising walking for 10 minutes 5 times per week. [name] had right eye cataract surgery, not much improvement as she was hoping for. She weight 251 today at home. She did have conversation with Rheumatologist and counselor in last 2 weeks. She is using MyFitness Pal she put her calories at 1,000 calories and trying to get 80g of protein. She is drinking milk in substitution of lean protein.  We were unable to weigh the patient today for this TeleHealth visit. She feels as if she has maintained weight since her last visit. She has lost 0 lbs since starting treatment with [place].  Rheumatoid Arthritis  [name] has a diagnosis of RA. She just finished initial 3 months. She denies any changes in signs or symptoms.   Right foot pain [name] reports right foot pain. Her recent xray on 01/31/19 was negative. She reports that she feels something is broken or injured.   ASSESSMENT AND PLAN:  Right foot pain  Rheumatoid arthritis involving both hands with positive rheumatoid factor (HCC)  Class 3 severe obesity with serious comorbidity and body mass index (BMI) of 45.0 to 49.9 in adult, unspecified obesity type ([place])  PLAN:  Rheumatoid Arthritis  [name] agrees to follow up with Dr. Estanislado Pandy. Agreed to follow up with our clinic as directed.   Right foot pain Instructed [name] to rest, ice, and  continue to use cane. She agreed to follow up with our clinic as directed.   Obesity [name] is currently in the action stage of change. As such, her goal is to continue with weight loss efforts She has agreed to follow the Category 2 plan [name] has been instructed to work up to a goal of 150 minutes of combined cardio and strengthening exercise per week for weight loss and overall health benefits. We discussed the following Behavioral Modification Stratagies today: keeping healthy foods in the home, planning for success,  increasing lean protein intake, increasing vegetables and work on meal planning and easy cooking plans.   [name] has agreed to follow up with our clinic in 2 weeks. She was informed of the importance of frequent follow up visits to maximize her success with intensive lifestyle modifications for her multiple health conditions.  ALLERGIES: Allergies  Allergen Reactions  . Codeine     REACTION: Nausea  . Cyclosporine Swelling    MEDICATIONS: Current Outpatient Medications on File Prior to Visit  Medication Sig Dispense Refill  . acetaminophen (TYLENOL) 500 MG tablet Take 500 mg by mouth every 8 (eight) hours as needed (pain).     [name] atorvastatin (LIPITOR) 40 MG tablet TAKE 1 TABLET BY MOUTH EVERY DAY 90 tablet 1  . BESIVANCE 0.6 % SUSP     . chlorzoxazone (PARAFON) 500 MG tablet Take by mouth 4 (four) times daily as needed for muscle spasms.    . cimetidine (TAGAMET) 200 MG tablet Take 200 mg by  mouth 2 (two) times daily.    . DUREZOL 0.05 % EMUL     . fluorometholone (FML) 0.1 % ophthalmic suspension     . HYDROcodone-acetaminophen (NORCO/VICODIN) 5-325 MG tablet Take one tablet by mouth every 6 hours as needed for pain 16 tablet 0  . hydroxychloroquine (PLAQUENIL) 200 MG tablet Take 1 tablet (200 mg total) by mouth 2 (two) times daily. 60 tablet 2  . lisinopril (ZESTRIL) 10 MG tablet TAKE 1 TABLET BY MOUTH EVERY DAY 90 tablet 1  . OVER THE COUNTER MEDICATION  daily.     . Vitamin D, Ergocalciferol, (DRISDOL) 1.25 MG (50000 UT) CAPS capsule Take 1 capsule (50,000 Units total) by mouth every 7 (seven) days. 12 capsule 0   Current Facility-Administered Medications on File Prior to Visit  Medication Dose Route Frequency Provider Last Rate Last Dose  . methylPREDNISolone acetate (DEPO-MEDROL) injection 40 mg  40 mg Intra-articular Once [name], MD        PAST MEDICAL HISTORY: Past Medical History:  Diagnosis Date  . Allergic rhinitis   . Anxiety   . Arthritis   . Asthma   . Constipation   . Depression   . Gallbladder problem   . Hematuria 08/25/2015  . Hyperlipidemia   . Hypertension   . Obesity   . Osteoarthritis   . Pelvic pain in female 08/25/2015  . Rheumatoid arthritis ([place])   . Swallowing difficulty   . Swelling     PAST SURGICAL HISTORY: Past Surgical History:  Procedure Laterality Date  . BACK SURGERY    . CHOLECYSTECTOMY    . COLONOSCOPY  2007   sessile sigmoid polyp x 1; path: serrated adenoma  . COLONOSCOPY N/A 10/12/2014   Procedure: COLONOSCOPY;  Surgeon: [name], MD;  Location: [facility];  Service: Endoscopy;  Laterality: N/A;  . JOINT REPLACEMENT     BIL knee   . KNEE ARTHROPLASTY    . KNEE SURGERY     bilateral knee replacement  . LIPOMA EXCISION     x 2  . TONSILLECTOMY AND ADENOIDECTOMY      SOCIAL HISTORY: Social History   Tobacco Use  . Smoking status: Never Smoker  . Smokeless tobacco: Never Used  Substance Use Topics  . Alcohol use: Yes    Alcohol/week: 0.0 standard drinks    Comment: sometimes; rarely  . Drug use: No    FAMILY HISTORY: Family History  Problem Relation Age of Onset  . Heart attack Father   . Alcoholism Father   . Depression Father   . Heart disease Father   . Hyperlipidemia Father   . Hypertension Father   . Leukemia Mother   . Hypertension Mother   . Cancer Mother   . Depression Mother   . Obesity Sister   . Other Sister        breathing problems   . Hepatitis C Sister   . Arthritis Sister   . Depression Sister   . Emphysema Brother   . Obesity Daughter   . Polycystic ovary syndrome Daughter   . Obesity Son   . Other Son        knee problems  . Cancer Maternal Grandmother        breast  . Other Brother        aneursym  . Arthritis Sister   . Depression Sister   . Obesity Son   . Asthma Son   . Obesity Son   . Diabetes Other  runs on dad's side of the family  . Colon cancer Neg Hx        states she doesn't know a lot about her family, but has never heard of specific colon ca    ROS: Review of Systems  Musculoskeletal:       Positive for right foot pain     PHYSICAL EXAM: Pt in no acute distress  RECENT LABS AND TESTS: BMET    Component Value Date/Time   NA 139 10/30/2018 1422   NA 142 04/05/2018 1119   K 4.6 10/30/2018 1422   CL 104 10/30/2018 1422   CO2 26 10/30/2018 1422   GLUCOSE 89 10/30/2018 1422   BUN 15 10/30/2018 1422   BUN 11 04/05/2018 1119   CREATININE 0.95 10/30/2018 1422   CALCIUM 10.6 (H) 10/30/2018 1422   GFRNONAA 62 10/30/2018 1422   GFRAA 72 10/30/2018 1422   Lab Results  Component Value Date   HGBA1C 5.5 10/31/2018   Lab Results  Component Value Date   INSULIN 12.3 10/31/2018   CBC    Component Value Date/Time   WBC 6.0 10/30/2018 1422   RBC 4.65 10/30/2018 1422   HGB 12.7 10/30/2018 1422   HGB 11.9 04/05/2018 1119   HCT 38.9 10/30/2018 1422   HCT 37.5 04/05/2018 1119   PLT 227 10/30/2018 1422   PLT 235 04/05/2018 1119   MCV 83.7 10/30/2018 1422   MCV 88 04/05/2018 1119   MCH 27.3 10/30/2018 1422   MCHC 32.6 10/30/2018 1422   RDW 14.9 10/30/2018 1422   RDW 18.4 (H) 04/05/2018 1119   LYMPHSABS 1,704 10/30/2018 1422   LYMPHSABS 1.0 04/05/2018 1119   MONOABS 924 03/06/2017 0923   EOSABS 108 10/30/2018 1422   EOSABS 0.1 04/05/2018 1119   BASOSABS 30 10/30/2018 1422   BASOSABS 0.0 04/05/2018 1119   Iron/TIBC/Ferritin/ %Sat No results found for: IRON, TIBC,  FERRITIN, IRONPCTSAT Lipid Panel     Component Value Date/Time   CHOL 203 (H) 10/31/2018 1253   TRIG 49 10/31/2018 1253   HDL 55 10/31/2018 1253   CHOLHDL 3.8 11/06/2017 1041   CHOLHDL 6.6 07/09/2014 0819   VLDL 13 07/09/2014 0819   LDLCALC 138 (H) 10/31/2018 1253   Hepatic Function Panel     Component Value Date/Time   PROT 7.0 10/30/2018 1422   PROT 6.5 04/05/2018 1119   ALBUMIN 4.1 04/05/2018 1119   AST 19 10/30/2018 1422   ALT 12 10/30/2018 1422   ALKPHOS 91 04/05/2018 1119   BILITOT 0.6 10/30/2018 1422   BILITOT 0.6 04/05/2018 1119   BILIDIR 0.15 12/20/2016 0857   IBILI 0.4 07/09/2014 0819      Component Value Date/Time   TSH 1.180 10/31/2018 1253   TSH 0.67 03/06/2017 0923   TSH 1.899 03/18/2009 0000      I, Renee Ramus, am acting as Location manager for Ilene Qua, MD   I have reviewed the above documentation for accuracy and completeness, and I agree with the above. - Ilene Qua, MD

## 2019-02-18 ENCOUNTER — Other Ambulatory Visit: Payer: Self-pay

## 2019-02-18 NOTE — Patient Outreach (Signed)
[facility]) Care Management  02/18/2019  [name] 28-Oct-1950 [id number]   [facility] BSW closing case due to patient receiving other CM services.   Ronn Melena, BSW Social Worker [phone]

## 2019-02-24 ENCOUNTER — Other Ambulatory Visit: Payer: Self-pay

## 2019-02-24 ENCOUNTER — Ambulatory Visit (INDEPENDENT_AMBULATORY_CARE_PROVIDER_SITE_OTHER): Payer: PPO | Admitting: Psychiatry

## 2019-02-24 DIAGNOSIS — F331 Major depressive disorder, recurrent, moderate: Secondary | ICD-10-CM
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
## 2019-02-27 ENCOUNTER — Encounter (INDEPENDENT_AMBULATORY_CARE_PROVIDER_SITE_OTHER): Payer: Self-pay | Admitting: Family Medicine

## 2019-02-27 ENCOUNTER — Ambulatory Visit (INDEPENDENT_AMBULATORY_CARE_PROVIDER_SITE_OTHER): Payer: PPO | Admitting: Family Medicine

## 2019-02-27 ENCOUNTER — Other Ambulatory Visit: Payer: Self-pay

## 2019-02-27 VITALS — BP 120/79 | HR 74 | Temp 98.0°F | Ht 63.0 in | Wt 243.0 lb

## 2019-02-27 DIAGNOSIS — I10 Essential (primary) hypertension: Secondary | ICD-10-CM | POA: Diagnosis not present

## 2019-02-27 DIAGNOSIS — Z6841 Body Mass Index (BMI) 40.0 and over, adult: Secondary | ICD-10-CM | POA: Diagnosis not present

## 2019-02-27 DIAGNOSIS — E7849 Other hyperlipidemia: Secondary | ICD-10-CM

## 2019-02-27 DIAGNOSIS — E8881 Metabolic syndrome: Secondary | ICD-10-CM | POA: Diagnosis not present

## 2019-02-27 DIAGNOSIS — E559 Vitamin D deficiency, unspecified: Secondary | ICD-10-CM | POA: Diagnosis not present

## 2019-02-28 LAB — LIPID PANEL WITH LDL/HDL RATIO
Cholesterol, Total: 211 mg/dL — ABNORMAL HIGH (ref 100–199)
HDL: 57 mg/dL (ref >39)
LDL Calculated: 144 mg/dL — ABNORMAL HIGH (ref 0–99)
LDl/HDL Ratio: 2.5 ratio (ref 0.0–3.2)
Triglycerides: 52 mg/dL (ref 0–149)
VLDL Cholesterol Cal: 10 mg/dL (ref 5–40)

## 2019-02-28 LAB — COMPREHENSIVE METABOLIC PANEL
ALT: 15 IU/L (ref 0–32)
AST: 20 IU/L (ref 0–40)
Albumin/Globulin Ratio: 1.6 (ref 1.2–2.2)
Albumin: 4.4 g/dL (ref 3.8–4.8)
Alkaline Phosphatase: 65 IU/L (ref 39–117)
BUN/Creatinine Ratio: 22 (ref 12–28)
BUN: 16 mg/dL (ref 8–27)
Bilirubin Total: 0.5 mg/dL (ref 0.0–1.2)
CO2: 24 mmol/L (ref 20–29)
Calcium: 10.9 mg/dL — ABNORMAL HIGH (ref 8.7–10.3)
Chloride: 104 mmol/L (ref 96–106)
Creatinine, Ser: 0.73 mg/dL (ref 0.57–1.00)
GFR calc Af Amer: 99 mL/min/{1.73_m2} (ref >59)
GFR calc non Af Amer: 85 mL/min/{1.73_m2} (ref >59)
Globulin, Total: 2.7 g/dL (ref 1.5–4.5)
Glucose: 79 mg/dL (ref 65–99)
Potassium: 4.5 mmol/L (ref 3.5–5.2)
Sodium: 142 mmol/L (ref 134–144)
Total Protein: 7.1 g/dL (ref 6.0–8.5)

## 2019-02-28 LAB — HEMOGLOBIN A1C
Est. average glucose Bld gHb Est-mCnc: 108 mg/dL
Hgb A1c MFr Bld: 5.4 % (ref 4.8–5.6)

## 2019-02-28 LAB — INSULIN, RANDOM: INSULIN: 8.3 u[IU]/mL (ref 2.6–24.9)

## 2019-02-28 LAB — VITAMIN D 25 HYDROXY (VIT D DEFICIENCY, FRACTURES): Vit D, 25-Hydroxy: 41.6 ng/mL (ref 30.0–100.0)

## 2019-03-03 NOTE — Progress Notes (Signed)
Office: [phone]  /  Fax: [phone]   HPI:   Chief Complaint: OBESITY [name] is here to discuss her progress with her obesity treatment plan. She is on the Category 2 plan and is following her eating plan approximately 70 % of the time. She states she is walking for 10 minutes 6 times per week. [name] says that she has been following the plan about 70 %, and sticks to about 3 meals a day without snacking. She realizes she mindlessly snacks while playing her computer game. She denies being hungry.  Her weight is 243 lb (110.2 kg) today and has had a weight loss of 24 pounds over a period of 4 months since her last visit. She has lost 24 lbs since starting treatment with [name].  Hyperlipidemia [name] has hyperlipidemia and has been trying to improve her cholesterol levels with intensive lifestyle modification including a low saturated fat diet, exercise and weight loss. Last LDL was of 138 and HDL of 55. She is on Lipitor and denies any chest pain, claudication or myalgias.  Vitamin D Deficiency [name] has a diagnosis of vitamin D deficiency. She is currently taking prescription Vit D. She notes fatigue and denies nausea, vomiting or muscle weakness.  Insulin Resistance [name] has a diagnosis of insulin resistance based on her elevated fasting insulin level >5. Although [name]'s blood glucose readings are still under good control, insulin resistance puts her at greater risk of metabolic syndrome and diabetes. She is not taking metformin currently and notes carbohydrate cravings. She continues to work on diet and exercise to decrease risk of diabetes.  [name] is a 68 y.o. female with Catherine. [name]'s blood pressure is controlled today. She denies chest pain, chest pressure, or headaches. She is working on weight loss to help control her blood pressure with the goal of decreasing her risk of heart attack and stroke.  ASSESSMENT AND PLAN:  Vitamin D  deficiency - Plan: VITAMIN D 25 Hydroxy (Vit-D Deficiency, Fractures)  Insulin resistance - Plan: Hemoglobin A1c, Insulin, random  Other hyperlipidemia - Plan: Lipid Panel With LDL/HDL Ratio  Essential Catherine - Plan: Comprehensive metabolic panel  Class 3 severe obesity with serious comorbidity and body mass index (BMI) of 40.0 to 44.9 in adult, unspecified obesity type ([place])  PLAN:  Hyperlipidemia [name] was informed of the American Heart Association Guidelines emphasizing intensive lifestyle modifications as the first line treatment for hyperlipidemia. We discussed many lifestyle modifications today in depth, and [name] will continue to work on decreasing saturated fats such as fatty red meat, butter and many fried foods. She will also increase vegetables and lean protein in her diet and continue to work on exercise and weight loss efforts. We will check FLP today. [name] agrees to follow up with our clinic in 2 weeks.  Vitamin D Deficiency [name] was informed that low vitamin D levels contributes to fatigue and are associated with obesity, breast, and colon cancer. [name] agrees to continue taking prescription Vit D 50,000 IU every week and will follow up for routine testing of vitamin D, at least 2-3 times per year. She was informed of the risk of over-replacement of vitamin D and agrees to not increase her dose unless she discusses this with [name] first. We will check Vit D level today. Keyara agrees to follow up with our clinic in 2 weeks.  Insulin Resistance [name] will continue to work on weight loss, exercise, and decreasing simple carbohydrates in her diet to help decrease the risk  of diabetes. We dicussed metformin including benefits and risks. She was informed that eating too many simple carbohydrates or too many calories at one sitting increases the likelihood of GI side effects. Mette declined metformin for now and prescription was not written today. We will check Hgb A1c  and insulin level today. [name] agrees to follow up with our clinic in 2 weeks as directed to monitor her progress.  Catherine We discussed sodium restriction, working on healthy weight loss, and a regular exercise program as the means to achieve improved blood pressure control. [name] agreed with this plan and agreed to follow up as directed. We will continue to monitor her blood pressure as well as her progress with the above lifestyle modifications. [name] agrees to continue her medications and will watch for signs of hypotension as she continues her lifestyle modifications. We will check CMP today. [name] agrees to follow up with our clinic in 2 weeks.  Obesity [name] is currently in the action stage of change. As such, her goal is to continue with weight loss efforts She has agreed to follow the Category 2 plan [name] has been instructed to work up to a goal of 150 minutes of combined cardio and strengthening exercise per week for weight loss and overall health benefits. We discussed the following Behavioral Modification Strategies today: increasing lean protein intake, increasing vegetables, work on meal planning and easy cooking plans, and planning for success   [name] has agreed to follow up with our clinic in 2 weeks. She was informed of the importance of frequent follow up visits to maximize her success with intensive lifestyle modifications for her multiple health conditions.  ALLERGIES: Allergies  Allergen Reactions   Codeine     REACTION: Nausea   Cyclosporine Swelling    MEDICATIONS: Current Outpatient Medications on File Prior to Visit  Medication Sig Dispense Refill   acetaminophen (TYLENOL) 500 MG tablet Take 500 mg by mouth every 8 (eight) hours as needed (pain).      atorvastatin (LIPITOR) 40 MG tablet TAKE 1 TABLET BY MOUTH EVERY DAY 90 tablet 1   BESIVANCE 0.6 % SUSP      cimetidine (TAGAMET) 200 MG tablet Take 200 mg by mouth 2 (two) times daily.       fluorometholone (FML) 0.1 % ophthalmic suspension      hydroxychloroquine (PLAQUENIL) 200 MG tablet Take 1 tablet (200 mg total) by mouth 2 (two) times daily. 60 tablet 2   lisinopril (ZESTRIL) 10 MG tablet TAKE 1 TABLET BY MOUTH EVERY DAY 90 tablet 1   OVER THE COUNTER MEDICATION daily.      Vitamin D, Ergocalciferol, (DRISDOL) 1.25 MG (50000 UT) CAPS capsule Take 1 capsule (50,000 Units total) by mouth every 7 (seven) days. 12 capsule 0   chlorzoxazone (PARAFON) 500 MG tablet Take by mouth 4 (four) times daily as needed for muscle spasms.     HYDROcodone-acetaminophen (NORCO/VICODIN) 5-325 MG tablet Take one tablet by mouth every 6 hours as needed for pain (Patient not taking: Reported on 02/27/2019) 16 tablet 0   Current Facility-Administered Medications on File Prior to Visit  Medication Dose Route Frequency Provider Last Rate Last Dose   methylPREDNISolone acetate (DEPO-MEDROL) injection 40 mg  40 mg Intra-articular Once [name], MD        PAST MEDICAL HISTORY: Past Medical History:  Diagnosis Date   Allergic rhinitis    Anxiety    Arthritis    Asthma    Constipation    Depression  Gallbladder problem    Hematuria 08/25/2015   Hyperlipidemia    Catherine    Obesity    Osteoarthritis    Pelvic pain in female 08/25/2015   Rheumatoid arthritis ([place])    Swallowing difficulty    Swelling     PAST SURGICAL HISTORY: Past Surgical History:  Procedure Laterality Date   BACK SURGERY     CHOLECYSTECTOMY     COLONOSCOPY  2007   sessile sigmoid polyp x 1; path: serrated adenoma   COLONOSCOPY N/A 10/12/2014   Procedure: COLONOSCOPY;  Surgeon: [name], MD;  Location: [facility];  Service: Endoscopy;  Laterality: N/A;   JOINT REPLACEMENT     BIL knee    KNEE ARTHROPLASTY     KNEE SURGERY     bilateral knee replacement   LIPOMA EXCISION     x 2   TONSILLECTOMY AND ADENOIDECTOMY      SOCIAL HISTORY: Social History    Tobacco Use   Smoking status: Never Smoker   Smokeless tobacco: Never Used  Substance Use Topics   Alcohol use: Yes    Alcohol/week: 0.0 standard drinks    Comment: sometimes; rarely   Drug use: No    FAMILY HISTORY: Family History  Problem Relation Age of Onset   Heart attack Father    Alcoholism Father    Depression Father    Heart disease Father    Hyperlipidemia Father    Catherine Father    Leukemia Mother    Catherine Mother    Cancer Mother    Depression Mother    Obesity Sister    Other Sister        breathing problems   Hepatitis C Sister    Arthritis Sister    Depression Sister    Emphysema Brother    Obesity Daughter    Polycystic ovary syndrome Daughter    Obesity Son    Other Son        knee problems   Cancer Maternal Grandmother        breast   Other Brother        aneursym   Arthritis Sister    Depression Sister    Obesity Son    Asthma Son    Obesity Son    Diabetes Other        runs on dad's side of the family   Colon cancer Neg Hx        states she doesn't know a lot about her family, but has never heard of specific colon ca    ROS: Review of Systems  Constitutional: Positive for malaise/fatigue and weight loss.  Cardiovascular: Negative for chest pain and claudication.       Negative chest pressure  Gastrointestinal: Negative for nausea and vomiting.  Musculoskeletal: Negative for myalgias.       Negative muscle weakness  Neurological: Negative for headaches.    PHYSICAL EXAM: Blood pressure 120/79, pulse 74, temperature 98 F (36.7 C), temperature source Oral, height 5\' 3"  (1.6 m), weight 243 lb (110.2 kg), SpO2 96 %. Body mass index is 43.05 kg/m. Physical Exam Vitals signs reviewed.  Constitutional:      Appearance: Normal appearance. She is obese.  Cardiovascular:     Rate and Rhythm: Normal rate.     Pulses: Normal pulses.  Pulmonary:     Effort: Pulmonary effort is normal.      Breath sounds: Normal breath sounds.  Musculoskeletal: Normal range of motion.  Skin:  General: Skin is warm and dry.  Neurological:     Mental Status: She is alert and oriented to person, place, and time.  Psychiatric:        Mood and Affect: Mood normal.        Behavior: Behavior normal.     RECENT LABS AND TESTS: BMET    Component Value Date/Time   NA 142 02/27/2019 1556   K 4.5 02/27/2019 1556   CL 104 02/27/2019 1556   CO2 24 02/27/2019 1556   GLUCOSE 79 02/27/2019 1556   GLUCOSE 89 10/30/2018 1422   BUN 16 02/27/2019 1556   CREATININE 0.73 02/27/2019 1556   CREATININE 0.95 10/30/2018 1422   CALCIUM 10.9 (H) 02/27/2019 1556   GFRNONAA 85 02/27/2019 1556   GFRNONAA 62 10/30/2018 1422   GFRAA 99 02/27/2019 1556   GFRAA 72 10/30/2018 1422   Lab Results  Component Value Date   HGBA1C 5.4 02/27/2019   HGBA1C 5.5 10/31/2018   Lab Results  Component Value Date   INSULIN 8.3 02/27/2019   INSULIN 12.3 10/31/2018   CBC    Component Value Date/Time   WBC 6.0 10/30/2018 1422   RBC 4.65 10/30/2018 1422   HGB 12.7 10/30/2018 1422   HGB 11.9 04/05/2018 1119   HCT 38.9 10/30/2018 1422   HCT 37.5 04/05/2018 1119   PLT 227 10/30/2018 1422   PLT 235 04/05/2018 1119   MCV 83.7 10/30/2018 1422   MCV 88 04/05/2018 1119   MCH 27.3 10/30/2018 1422   MCHC 32.6 10/30/2018 1422   RDW 14.9 10/30/2018 1422   RDW 18.4 (H) 04/05/2018 1119   LYMPHSABS 1,704 10/30/2018 1422   LYMPHSABS 1.0 04/05/2018 1119   MONOABS 924 03/06/2017 0923   EOSABS 108 10/30/2018 1422   EOSABS 0.1 04/05/2018 1119   BASOSABS 30 10/30/2018 1422   BASOSABS 0.0 04/05/2018 1119   Iron/TIBC/Ferritin/ %Sat No results found for: IRON, TIBC, FERRITIN, IRONPCTSAT Lipid Panel     Component Value Date/Time   CHOL 211 (H) 02/27/2019 1556   TRIG 52 02/27/2019 1556   HDL 57 02/27/2019 1556   CHOLHDL 3.8 11/06/2017 1041   CHOLHDL 6.6 07/09/2014 0819   VLDL 13 07/09/2014 0819   LDLCALC 144 (H)  02/27/2019 1556   Hepatic Function Panel     Component Value Date/Time   PROT 7.1 02/27/2019 1556   ALBUMIN 4.4 02/27/2019 1556   AST 20 02/27/2019 1556   ALT 15 02/27/2019 1556   ALKPHOS 65 02/27/2019 1556   BILITOT 0.5 02/27/2019 1556   BILIDIR 0.15 12/20/2016 0857   IBILI 0.4 07/09/2014 0819      Component Value Date/Time   TSH 1.180 10/31/2018 1253   TSH 0.67 03/06/2017 0923   TSH 1.899 03/18/2009 0000      OBESITY BEHAVIORAL INTERVENTION VISIT  Today's visit was # 8   Starting weight: 267 lbs Starting date: 10/31/2018 Today's weight : 243 lbs Today's date: 02/27/2019 Total lbs lost to date: 24 At least 15 minutes were spent on discussing the following behavioral intervention visit.   ASK: We discussed the diagnosis of obesity with [name] today and [name] agreed to give [name] permission to discuss obesity behavioral modification therapy today.  ASSESS: Jolicia has the diagnosis of obesity and her BMI today is 43.06 Betha is in the action stage of change   ADVISE: Jacqualine was educated on the multiple health risks of obesity as well as the benefit of weight loss to improve her health. She was  advised of the need for long term treatment and the importance of lifestyle modifications to improve her current health and to decrease her risk of future health problems.  AGREE: Multiple dietary modification options and treatment options were discussed and  Ayen agreed to follow the recommendations documented in the above note.  ARRANGE: Kecia was educated on the importance of frequent visits to treat obesity as outlined per CMS and USPSTF guidelines and agreed to schedule her next follow up appointment today.  I, Trixie Dredge, am acting as transcriptionist for Ilene Qua, MD  I have reviewed the above documentation for accuracy and completeness, and I agree with the above. - Ilene Qua, MD

## 2019-03-10 ENCOUNTER — Other Ambulatory Visit: Payer: Self-pay

## 2019-03-10 ENCOUNTER — Ambulatory Visit (INDEPENDENT_AMBULATORY_CARE_PROVIDER_SITE_OTHER): Payer: PPO | Admitting: Psychiatry

## 2019-03-10 DIAGNOSIS — F331 Major depressive disorder, recurrent, moderate: Secondary | ICD-10-CM | POA: Diagnosis not present
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
## 2019-03-24 ENCOUNTER — Encounter (HOSPITAL_COMMUNITY): Payer: Self-pay | Admitting: Psychiatry

## 2019-03-24 ENCOUNTER — Ambulatory Visit (INDEPENDENT_AMBULATORY_CARE_PROVIDER_SITE_OTHER): Payer: PPO | Admitting: Psychiatry

## 2019-03-24 ENCOUNTER — Other Ambulatory Visit: Payer: Self-pay

## 2019-03-24 DIAGNOSIS — F331 Major depressive disorder, recurrent, moderate: Secondary | ICD-10-CM | POA: Diagnosis not present
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
## 2019-03-27 ENCOUNTER — Other Ambulatory Visit: Payer: Self-pay

## 2019-03-27 ENCOUNTER — Encounter (INDEPENDENT_AMBULATORY_CARE_PROVIDER_SITE_OTHER): Payer: Self-pay | Admitting: Family Medicine

## 2019-03-27 ENCOUNTER — Telehealth (INDEPENDENT_AMBULATORY_CARE_PROVIDER_SITE_OTHER): Payer: PPO | Admitting: Family Medicine

## 2019-03-27 DIAGNOSIS — E7849 Other hyperlipidemia: Secondary | ICD-10-CM | POA: Diagnosis not present

## 2019-03-27 DIAGNOSIS — Z6841 Body Mass Index (BMI) 40.0 and over, adult: Secondary | ICD-10-CM

## 2019-03-27 MED ORDER — ATORVASTATIN CALCIUM 80 MG PO TABS: ORAL_TABLET | ORAL | 0 refills | Status: DC

## 2019-03-27 NOTE — Progress Notes (Signed)
Office: [phone]  /  Fax: [phone] TeleHealth Visit:  [name] has verbally consented to this TeleHealth visit today. The patient is located at home, the provider is located at the [facility] office. The participants in this visit include the listed provider, patient, and the patient's husband, [name]. The visit was conducted today via FaceTime.  HPI:   Chief Complaint: OBESITY [name] is here to discuss her progress with her obesity treatment plan. She is on the Category 2 plan and is following her eating plan approximately 70% of the time. She states she is walking 10 minutes 6 times per week. [name] voices she has probably been eating more protein than previously and this is secondary to her husband eating more protein. She denies hunger but does report cravings. She reports she has lost approximately 2.5 lbs (weight 245). We were unable to weigh the patient today for this TeleHealth visit. She feels as if she has lost 2.5 lbs since her last visit. She has lost 24 lbs since starting treatment with [place].  Hypercalcemia  [name]'s calcium has increased to 10.9 (was previously 10.6). She reports that she was previously evaluated for hyperparathyroidism but cannot recall the results.  Hyperlipidemia [name] has hyperlipidemia and is on atorvastatin 40 mg (high intensity but lower dose). She has been trying to improve her cholesterol levels with intensive lifestyle modification including a low saturated fat diet, exercise and weight loss.  ASSESSMENT AND PLAN:  Hypercalcemia - Plan: Comprehensive metabolic panel, Parathyroid hormone, intact (no Ca)  Other hyperlipidemia  Class 3 severe obesity with serious comorbidity and body mass index (BMI) of 40.0 to 44.9 in adult, unspecified obesity type ([name])  PLAN:  Hypercalcemia Jannet will have PTH and CMP checked. She will discuss follow-up with Dr. Wolfgang Phoenix.  Hyperlipidemia [name] was informed of the American  Heart Association Guidelines emphasizing intensive lifestyle modifications as the first line treatment for hyperlipidemia. We discussed many lifestyle modifications today in depth, and Aimar will continue to work on decreasing saturated fats such as fatty red meat, butter and many fried foods. Doshia will increase her atorvastatin to 80 mg PO daily #30 with 0 refills and agrees to follow-up with our clinic in 2 weeks. She will also increase vegetables and lean protein in her diet and continue to work on exercise and weight loss efforts.  Obesity [name] is currently in the action stage of change. As such, her goal is to continue with weight loss efforts. She has agreed to follow the Category 2 plan. [name] has been instructed to work up to a goal of 150 minutes of combined cardio and strengthening exercise per week for weight loss and overall health benefits. We discussed the following Behavioral Modification Strategies today: increasing lean protein intake, increasing vegetables, work on meal planning and easy cooking plans, keeping healthy foods in the home, and planning for success.  [name] has agreed to follow-up with our clinic in 2 weeks. She was informed of the importance of frequent follow-up visits to maximize her success with intensive lifestyle modifications for her multiple health conditions.  ALLERGIES: Allergies  Allergen Reactions  . Codeine     REACTION: Nausea  . Cyclosporine Swelling    MEDICATIONS: Current Outpatient Medications on File Prior to Visit  Medication Sig Dispense Refill  . acetaminophen (TYLENOL) 500 MG tablet Take 500 mg by mouth every 8 (eight) hours as needed (pain).     . BESIVANCE 0.6 % SUSP     . chlorzoxazone (PARAFON)  500 MG tablet Take by mouth 4 (four) times daily as needed for muscle spasms.    . fluorometholone (FML) 0.1 % ophthalmic suspension     . hydroxychloroquine (PLAQUENIL) 200 MG tablet Take 1 tablet (200 mg total) by mouth 2 (two)  times daily. 60 tablet 2  . lisinopril (ZESTRIL) 10 MG tablet TAKE 1 TABLET BY MOUTH EVERY DAY 90 tablet 1  . OVER THE COUNTER MEDICATION daily.     [name] Propylene Glycol (SYSTANE BALANCE OP) Apply to eye.    . Vitamin D, Ergocalciferol, (DRISDOL) 1.25 MG (50000 UT) CAPS capsule Take 1 capsule (50,000 Units total) by mouth every 7 (seven) days. 12 capsule 0  . cimetidine (TAGAMET) 200 MG tablet Take 200 mg by mouth 2 (two) times daily.    [name] HYDROcodone-acetaminophen (NORCO/VICODIN) 5-325 MG tablet Take one tablet by mouth every 6 hours as needed for pain (Patient not taking: Reported on 03/27/2019) 16 tablet 0   Current Facility-Administered Medications on File Prior to Visit  Medication Dose Route Frequency Provider Last Rate Last Dose  . methylPREDNISolone acetate (DEPO-MEDROL) injection 40 mg  40 mg Intra-articular Once [name], MD        PAST MEDICAL HISTORY: Past Medical History:  Diagnosis Date  . Allergic rhinitis   . Anxiety   . Arthritis   . Asthma   . Constipation   . Depression   . Gallbladder problem   . Hematuria 08/25/2015  . Hyperlipidemia   . Hypertension   . Obesity   . Osteoarthritis   . Pelvic pain in female 08/25/2015  . Rheumatoid arthritis ([place])   . Swallowing difficulty   . Swelling     PAST SURGICAL HISTORY: Past Surgical History:  Procedure Laterality Date  . BACK SURGERY    . CHOLECYSTECTOMY    . COLONOSCOPY  2007   sessile sigmoid polyp x 1; path: serrated adenoma  . COLONOSCOPY N/A 10/12/2014   Procedure: COLONOSCOPY;  Surgeon: [name], MD;  [facility];  Service: Endoscopy;  Laterality: N/A;  . JOINT REPLACEMENT     BIL knee   . KNEE ARTHROPLASTY    . KNEE SURGERY     bilateral knee replacement  . LIPOMA EXCISION     x 2  . TONSILLECTOMY AND ADENOIDECTOMY      SOCIAL HISTORY: Social History   Tobacco Use  . Smoking status: Never Smoker  . Smokeless tobacco: Never Used  Substance Use Topics  . Alcohol use: Yes     Alcohol/week: 0.0 standard drinks    Comment: sometimes; rarely  . Drug use: No    FAMILY HISTORY: Family History  Problem Relation Age of Onset  . Heart attack Father   . Alcoholism Father   . Depression Father   . Heart disease Father   . Hyperlipidemia Father   . Hypertension Father   . Leukemia Mother   . Hypertension Mother   . Cancer Mother   . Depression Mother   . Obesity Sister   . Other Sister        breathing problems  . Hepatitis C Sister   . Arthritis Sister   . Depression Sister   . Emphysema Brother   . Obesity Daughter   . Polycystic ovary syndrome Daughter   . Obesity Son   . Other Son        knee problems  . Cancer Maternal Grandmother        breast  . Other Brother  aneursym  . Arthritis Sister   . Depression Sister   . Obesity Son   . Asthma Son   . Obesity Son   . Diabetes Other        runs on dad's side of the family  . Colon cancer Neg Hx        states she doesn't know a lot about her family, but has never heard of specific colon ca   ROS: ROS none noted.  PHYSICAL EXAM: Pt in no acute distress  RECENT LABS AND TESTS: BMET    Component Value Date/Time   NA 142 02/27/2019 1556   K 4.5 02/27/2019 1556   CL 104 02/27/2019 1556   CO2 24 02/27/2019 1556   GLUCOSE 79 02/27/2019 1556   GLUCOSE 89 10/30/2018 1422   BUN 16 02/27/2019 1556   CREATININE 0.73 02/27/2019 1556   CREATININE 0.95 10/30/2018 1422   CALCIUM 10.9 (H) 02/27/2019 1556   GFRNONAA 85 02/27/2019 1556   GFRNONAA 62 10/30/2018 1422   GFRAA 99 02/27/2019 1556   GFRAA 72 10/30/2018 1422   Lab Results  Component Value Date   HGBA1C 5.4 02/27/2019   HGBA1C 5.5 10/31/2018   Lab Results  Component Value Date   INSULIN 8.3 02/27/2019   INSULIN 12.3 10/31/2018   CBC    Component Value Date/Time   WBC 6.0 10/30/2018 1422   RBC 4.65 10/30/2018 1422   HGB 12.7 10/30/2018 1422   HGB 11.9 04/05/2018 1119   HCT 38.9 10/30/2018 1422   HCT 37.5 04/05/2018  1119   PLT 227 10/30/2018 1422   PLT 235 04/05/2018 1119   MCV 83.7 10/30/2018 1422   MCV 88 04/05/2018 1119   MCH 27.3 10/30/2018 1422   MCHC 32.6 10/30/2018 1422   RDW 14.9 10/30/2018 1422   RDW 18.4 (H) 04/05/2018 1119   LYMPHSABS 1,704 10/30/2018 1422   LYMPHSABS 1.0 04/05/2018 1119   MONOABS 924 03/06/2017 0923   EOSABS 108 10/30/2018 1422   EOSABS 0.1 04/05/2018 1119   BASOSABS 30 10/30/2018 1422   BASOSABS 0.0 04/05/2018 1119   Iron/TIBC/Ferritin/ %Sat No results found for: IRON, TIBC, FERRITIN, IRONPCTSAT Lipid Panel     Component Value Date/Time   CHOL 211 (H) 02/27/2019 1556   TRIG 52 02/27/2019 1556   HDL 57 02/27/2019 1556   CHOLHDL 3.8 11/06/2017 1041   CHOLHDL 6.6 07/09/2014 0819   VLDL 13 07/09/2014 0819   LDLCALC 144 (H) 02/27/2019 1556   Hepatic Function Panel     Component Value Date/Time   PROT 7.1 02/27/2019 1556   ALBUMIN 4.4 02/27/2019 1556   AST 20 02/27/2019 1556   ALT 15 02/27/2019 1556   ALKPHOS 65 02/27/2019 1556   BILITOT 0.5 02/27/2019 1556   BILIDIR 0.15 12/20/2016 0857   IBILI 0.4 07/09/2014 0819      Component Value Date/Time   TSH 1.180 10/31/2018 1253   TSH 0.67 03/06/2017 0923   TSH 1.899 03/18/2009 0000    Results for CYPRESS, HINKSON (MRN [id number]) as of 03/27/2019 09:53  Ref. Range 02/27/2019 15:56  Vitamin D, 25-Hydroxy Latest Ref Range: 30.0 - 100.0 ng/mL 41.6    I, Michaelene Song, am acting as Location manager for Ilene Qua, MD   I have reviewed the above documentation for accuracy and completeness, and I agree with the above. - Ilene Qua, MD

## 2019-04-04 LAB — PARATHYROID HORMONE, INTACT (NO CA): PTH: 33 pg/mL (ref 15–65)

## 2019-04-04 LAB — COMPREHENSIVE METABOLIC PANEL
ALT: 16 IU/L (ref 0–32)
AST: 19 IU/L (ref 0–40)
Albumin/Globulin Ratio: 1.5 (ref 1.2–2.2)
Albumin: 4 g/dL (ref 3.8–4.8)
Alkaline Phosphatase: 75 IU/L (ref 39–117)
BUN/Creatinine Ratio: 22 (ref 12–28)
BUN: 20 mg/dL (ref 8–27)
Bilirubin Total: 0.6 mg/dL (ref 0.0–1.2)
CO2: 24 mmol/L (ref 20–29)
Calcium: 10.5 mg/dL — ABNORMAL HIGH (ref 8.7–10.3)
Chloride: 105 mmol/L (ref 96–106)
Creatinine, Ser: 0.91 mg/dL (ref 0.57–1.00)
GFR calc Af Amer: 76 mL/min/{1.73_m2} (ref >59)
GFR calc non Af Amer: 65 mL/min/{1.73_m2} (ref >59)
Globulin, Total: 2.7 g/dL (ref 1.5–4.5)
Glucose: 82 mg/dL (ref 65–99)
Potassium: 5.1 mmol/L (ref 3.5–5.2)
Sodium: 142 mmol/L (ref 134–144)
Total Protein: 6.7 g/dL (ref 6.0–8.5)

## 2019-04-07 ENCOUNTER — Ambulatory Visit (INDEPENDENT_AMBULATORY_CARE_PROVIDER_SITE_OTHER): Payer: PPO | Admitting: Psychiatry

## 2019-04-07 ENCOUNTER — Other Ambulatory Visit: Payer: Self-pay

## 2019-04-07 ENCOUNTER — Encounter (HOSPITAL_COMMUNITY): Payer: Self-pay | Admitting: Psychiatry

## 2019-04-07 DIAGNOSIS — F331 Major depressive disorder, recurrent, moderate: Secondary | ICD-10-CM

## 2019-04-07 DIAGNOSIS — Z79899 Other long term (current) drug therapy: Secondary | ICD-10-CM | POA: Diagnosis not present
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
## 2019-04-10 ENCOUNTER — Other Ambulatory Visit: Payer: Self-pay

## 2019-04-10 ENCOUNTER — Telehealth (INDEPENDENT_AMBULATORY_CARE_PROVIDER_SITE_OTHER): Payer: PPO | Admitting: Family Medicine

## 2019-04-10 ENCOUNTER — Encounter (INDEPENDENT_AMBULATORY_CARE_PROVIDER_SITE_OTHER): Payer: Self-pay | Admitting: Family Medicine

## 2019-04-10 DIAGNOSIS — E66813 Obesity, class 3: Secondary | ICD-10-CM

## 2019-04-10 DIAGNOSIS — I10 Essential (primary) hypertension: Secondary | ICD-10-CM | POA: Diagnosis not present

## 2019-04-10 DIAGNOSIS — Z6841 Body Mass Index (BMI) 40.0 and over, adult: Secondary | ICD-10-CM | POA: Diagnosis not present

## 2019-04-15 NOTE — Progress Notes (Signed)
Office: [phone]  /  Fax: [phone] TeleHealth Visit:  [name] has verbally consented to this TeleHealth visit today. The patient is located at home, the provider is located at the [facility] office. The participants in this visit include the listed provider and patient and patient's husband. The visit was conducted today via face time.  HPI:   Chief Complaint: OBESITY [name] is here to discuss her progress with her obesity treatment plan. She is on the Category 2 plan and is following her eating plan approximately 70 % of the time. She states she is walking for 10 minutes 6 times per week. [name] states her son is here visiting for another week and is doing some indulgent eating with him. She did indulge in [name] and ice cream. She is still going to therapy and working on some emotional issues. Her weight is of 244.6 lbs.  We were unable to weigh the patient today for this TeleHealth visit. She feels as if she has lost 1.5 lbs since her last visit. She has lost 24-25 lbs since starting treatment with [place].  Hypercalcemia [name]'s PTH was within normal limits, Ca 10.5.  Hypertension [name] is a 68 y.o. female with hypertension. [name]'s blood pressure was of 107/89 this morning. She denies dizziness or lightheadedness. She is working on weight loss to help control her blood pressure with the goal of decreasing her risk of heart attack and stroke.   ASSESSMENT AND PLAN:  No diagnosis found.  PLAN:  Hypercalcemia [name] is to follow up with her primary care physician for further workup, may want to include PTH as Vit D was done previously.  Hypertension We discussed sodium restriction, working on healthy weight loss, and a regular exercise program as the means to achieve improved blood pressure control. [name] agreed with this plan and agreed to follow up as directed. We will continue to monitor her blood pressure as well as her progress  with the above lifestyle modifications. She will continue her medications and will watch for signs of hypotension as she continues her lifestyle modifications. We will follow up on her blood pressure at her next appointment. [name] agrees to follow up with our clinic in 3 weeks.  Obesity [name] is currently in the action stage of change. As such, her goal is to continue with weight loss efforts She has agreed to follow the Category 2 plan [name] has been instructed to work up to a goal of 150 minutes of combined cardio and strengthening exercise per week for weight loss and overall health benefits. We discussed the following Behavioral Modification Strategies today: increasing lean protein intake, increasing vegetables and work on meal planning and easy cooking plans, keeping healthy foods in the home, and planning for success   [name] has agreed to follow up with our clinic in 3 weeks. She was informed of the importance of frequent follow up visits to maximize her success with intensive lifestyle modifications for her multiple health conditions.  ALLERGIES: Allergies  Allergen Reactions  . Codeine     REACTION: Nausea  . Cyclosporine Swelling    MEDICATIONS: Current Outpatient Medications on File Prior to Visit  Medication Sig Dispense Refill  . acetaminophen (TYLENOL) 500 MG tablet Take 500 mg by mouth every 8 (eight) hours as needed (pain).     [name] atorvastatin (LIPITOR) 80 MG tablet TAKE 1 TABLET BY MOUTH EVERY DAY 30 tablet 0  . chlorzoxazone (PARAFON) 500 MG tablet Take by mouth  4 (four) times daily as needed for muscle spasms.    . fluorometholone (FML) 0.1 % ophthalmic suspension     . HYDROcodone-acetaminophen (NORCO/VICODIN) 5-325 MG tablet Take one tablet by mouth every 6 hours as needed for pain 16 tablet 0  . hydroxychloroquine (PLAQUENIL) 200 MG tablet Take 1 tablet (200 mg total) by mouth 2 (two) times daily. 60 tablet 2  . lisinopril (ZESTRIL) 10 MG tablet TAKE 1 TABLET  BY MOUTH EVERY DAY 90 tablet 1  . OVER THE COUNTER MEDICATION daily.     [name] Propylene Glycol (SYSTANE BALANCE OP) Apply to eye.    . Vitamin D, Ergocalciferol, (DRISDOL) 1.25 MG (50000 UT) CAPS capsule Take 1 capsule (50,000 Units total) by mouth every 7 (seven) days. 12 capsule 0  . BESIVANCE 0.6 % SUSP     . cimetidine (TAGAMET) 200 MG tablet Take 200 mg by mouth 2 (two) times daily.     Current Facility-Administered Medications on File Prior to Visit  Medication Dose Route Frequency Provider Last Rate Last Dose  . methylPREDNISolone acetate (DEPO-MEDROL) injection 40 mg  40 mg Intra-articular Once [name], MD        PAST MEDICAL HISTORY: Past Medical History:  Diagnosis Date  . Allergic rhinitis   . Anxiety   . Arthritis   . Asthma   . Constipation   . Depression   . Gallbladder problem   . Hematuria 08/25/2015  . Hyperlipidemia   . Hypertension   . Obesity   . Osteoarthritis   . Pelvic pain in female 08/25/2015  . Rheumatoid arthritis ([place])   . Swallowing difficulty   . Swelling     PAST SURGICAL HISTORY: Past Surgical History:  Procedure Laterality Date  . BACK SURGERY    . CHOLECYSTECTOMY    . COLONOSCOPY  2007   sessile sigmoid polyp x 1; path: serrated adenoma  . COLONOSCOPY N/A 10/12/2014   Procedure: COLONOSCOPY;  Surgeon: [name], MD;  Location[facility];  Service: Endoscopy;  Laterality: N/A;  . JOINT REPLACEMENT     BIL knee   . KNEE ARTHROPLASTY    . KNEE SURGERY     bilateral knee replacement  . LIPOMA EXCISION     x 2  . TONSILLECTOMY AND ADENOIDECTOMY      SOCIAL HISTORY: Social History   Tobacco Use  . Smoking status: Never Smoker  . Smokeless tobacco: Never Used  Substance Use Topics  . Alcohol use: Yes    Alcohol/week: 0.0 standard drinks    Comment: sometimes; rarely  . Drug use: No    FAMILY HISTORY: Family History  Problem Relation Age of Onset  . Heart attack Father   . Alcoholism Father   . Depression  Father   . Heart disease Father   . Hyperlipidemia Father   . Hypertension Father   . Leukemia Mother   . Hypertension Mother   . Cancer Mother   . Depression Mother   . Obesity Sister   . Other Sister        breathing problems  . Hepatitis C Sister   . Arthritis Sister   . Depression Sister   . Emphysema Brother   . Obesity Daughter   . Polycystic ovary syndrome Daughter   . Obesity Son   . Other Son        knee problems  . Cancer Maternal Grandmother        breast  . Other Brother  aneursym  . Arthritis Sister   . Depression Sister   . Obesity Son   . Asthma Son   . Obesity Son   . Diabetes Other        runs on dad's side of the family  . Colon cancer Neg Hx        states she doesn't know a lot about her family, but has never heard of specific colon ca    ROS: Review of Systems  Constitutional: Positive for weight loss.  Neurological: Negative for dizziness.       Negative lightheadedness    PHYSICAL EXAM: Pt in no acute distress  RECENT LABS AND TESTS: BMET    Component Value Date/Time   NA 142 04/03/2019 1030   K 5.1 04/03/2019 1030   CL 105 04/03/2019 1030   CO2 24 04/03/2019 1030   GLUCOSE 82 04/03/2019 1030   GLUCOSE 89 10/30/2018 1422   BUN 20 04/03/2019 1030   CREATININE 0.91 04/03/2019 1030   CREATININE 0.95 10/30/2018 1422   CALCIUM 10.5 (H) 04/03/2019 1030   GFRNONAA 65 04/03/2019 1030   GFRNONAA 62 10/30/2018 1422   GFRAA 76 04/03/2019 1030   GFRAA 72 10/30/2018 1422   Lab Results  Component Value Date   HGBA1C 5.4 02/27/2019   HGBA1C 5.5 10/31/2018   Lab Results  Component Value Date   INSULIN 8.3 02/27/2019   INSULIN 12.3 10/31/2018   CBC    Component Value Date/Time   WBC 6.0 10/30/2018 1422   RBC 4.65 10/30/2018 1422   HGB 12.7 10/30/2018 1422   HGB 11.9 04/05/2018 1119   HCT 38.9 10/30/2018 1422   HCT 37.5 04/05/2018 1119   PLT 227 10/30/2018 1422   PLT 235 04/05/2018 1119   MCV 83.7 10/30/2018 1422   MCV 88  04/05/2018 1119   MCH 27.3 10/30/2018 1422   MCHC 32.6 10/30/2018 1422   RDW 14.9 10/30/2018 1422   RDW 18.4 (H) 04/05/2018 1119   LYMPHSABS 1,704 10/30/2018 1422   LYMPHSABS 1.0 04/05/2018 1119   MONOABS 924 03/06/2017 0923   EOSABS 108 10/30/2018 1422   EOSABS 0.1 04/05/2018 1119   BASOSABS 30 10/30/2018 1422   BASOSABS 0.0 04/05/2018 1119   Iron/TIBC/Ferritin/ %Sat No results found for: IRON, TIBC, FERRITIN, IRONPCTSAT Lipid Panel     Component Value Date/Time   CHOL 211 (H) 02/27/2019 1556   TRIG 52 02/27/2019 1556   HDL 57 02/27/2019 1556   CHOLHDL 3.8 11/06/2017 1041   CHOLHDL 6.6 07/09/2014 0819   VLDL 13 07/09/2014 0819   LDLCALC 144 (H) 02/27/2019 1556   Hepatic Function Panel     Component Value Date/Time   PROT 6.7 04/03/2019 1030   ALBUMIN 4.0 04/03/2019 1030   AST 19 04/03/2019 1030   ALT 16 04/03/2019 1030   ALKPHOS 75 04/03/2019 1030   BILITOT 0.6 04/03/2019 1030   BILIDIR 0.15 12/20/2016 0857   IBILI 0.4 07/09/2014 0819      Component Value Date/Time   TSH 1.180 10/31/2018 1253   TSH 0.67 03/06/2017 0923   TSH 1.899 03/18/2009 0000      I, Trixie Dredge, am acting as transcriptionist for Ilene Qua, MD  I have reviewed the above documentation for accuracy and completeness, and I agree with the above. - Ilene Qua, MD

## 2019-04-19 ENCOUNTER — Other Ambulatory Visit (INDEPENDENT_AMBULATORY_CARE_PROVIDER_SITE_OTHER): Payer: Self-pay | Admitting: Family Medicine

## 2019-04-21 ENCOUNTER — Ambulatory Visit (HOSPITAL_COMMUNITY): Payer: PPO | Admitting: Psychiatry

## 2019-04-21 ENCOUNTER — Other Ambulatory Visit: Payer: Self-pay

## 2019-04-23 ENCOUNTER — Other Ambulatory Visit (INDEPENDENT_AMBULATORY_CARE_PROVIDER_SITE_OTHER): Payer: Self-pay | Admitting: Family Medicine

## 2019-04-23 DIAGNOSIS — E559 Vitamin D deficiency, unspecified: Secondary | ICD-10-CM

## 2019-04-29 ENCOUNTER — Other Ambulatory Visit: Payer: Self-pay

## 2019-04-29 ENCOUNTER — Ambulatory Visit (INDEPENDENT_AMBULATORY_CARE_PROVIDER_SITE_OTHER): Payer: PPO | Admitting: Psychiatry

## 2019-04-29 ENCOUNTER — Encounter (HOSPITAL_COMMUNITY): Payer: Self-pay | Admitting: Psychiatry

## 2019-04-29 DIAGNOSIS — F331 Major depressive disorder, recurrent, moderate: Secondary | ICD-10-CM
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
## 2019-05-01 ENCOUNTER — Other Ambulatory Visit: Payer: Self-pay

## 2019-05-01 ENCOUNTER — Encounter (INDEPENDENT_AMBULATORY_CARE_PROVIDER_SITE_OTHER): Payer: Self-pay | Admitting: Family Medicine

## 2019-05-01 ENCOUNTER — Telehealth (INDEPENDENT_AMBULATORY_CARE_PROVIDER_SITE_OTHER): Payer: PPO | Admitting: Family Medicine

## 2019-05-01 DIAGNOSIS — Z6841 Body Mass Index (BMI) 40.0 and over, adult: Secondary | ICD-10-CM

## 2019-05-01 DIAGNOSIS — I10 Essential (primary) hypertension: Secondary | ICD-10-CM

## 2019-05-01 DIAGNOSIS — E559 Vitamin D deficiency, unspecified: Secondary | ICD-10-CM | POA: Diagnosis not present

## 2019-05-01 MED ORDER — VITAMIN D (ERGOCALCIFEROL) 1.25 MG (50000 UNIT) PO CAPS: 50000.0000 [IU] | ORAL_CAPSULE | ORAL | 0 refills | Status: DC

## 2019-05-05 NOTE — Progress Notes (Signed)
Office: [phone]  /  Fax: [phone] TeleHealth Visit:  [name] has verbally consented to this TeleHealth visit today. The patient is located at home, the provider is located at the [facility] office. The participants in this visit include the listed provider and patient and patient's husband. The visit was conducted today via face time.  HPI:   Chief Complaint: OBESITY [name] is here to discuss her progress with her obesity treatment plan. She is on the Category 2 plan and is following her eating plan approximately 100 % of the time. She states she is doing house work, and walking for 10 minutes 7 times per week. [name]'s weight is of 245 lbs today, no real change since her last appointment. Her son had visited which she voices she didn't follow the plan as strictly. She has been deviating off her structured plan. She has been trying to incorporate more rich and creamy foods.  We were unable to weigh the patient today for this TeleHealth visit. She feels as if she has maintained her weight since her last visit. She has lost 25 lbs since starting treatment with [place].  Vitamin D Deficiency [name] has a diagnosis of vitamin D deficiency. She is currently taking prescription Vit D. She notes fatigue and denies nausea, vomiting or muscle weakness.  Hypertension [name] is a 68 y.o. female with hypertension. [name]'s blood pressure was 89/63 this morning (often low in the morning but gets to [id number] systolic in the afternoon). She notes issues with cuffs occasionally getting an error or getting low readings. She denies chest pain. She is working is weight loss to help control her blood pressure with the goal of decreasing her risk of heart attack and stroke.   ASSESSMENT AND PLAN:  Essential hypertension  Vitamin D deficiency - Plan: Vitamin D, Ergocalciferol, (DRISDOL) 1.25 MG (50000 UT) CAPS capsule  Class 3 severe obesity with serious comorbidity and body  mass index (BMI) of 40.0 to 44.9 in adult, unspecified obesity type ([name])  PLAN:  Vitamin D Deficiency [name] was informed that low vitamin D levels contributes to fatigue and are associated with obesity, breast, and colon cancer. [name] agrees to continue taking prescription Vit D 50,000 IU every week #12, 90 days supply with no refills. She will follow up for routine testing of vitamin D, at least 2-3 times per year. She was informed of the risk of over-replacement of vitamin D and agrees to not increase her dose unless she discusses this with [place] first. [name] agrees to follow up with our clinic in 3 weeks.  Hypertension We discussed sodium restriction, working on healthy weight loss, and a regular exercise program as the means to achieve improved blood pressure control. [name] agreed with this plan and agreed to follow up as directed. We will continue to monitor her blood pressure as well as her progress with the above lifestyle modifications. [name] agrees to continue her current medications and will watch for signs of hypotension as she continues her lifestyle modifications. [name] agrees to follow up with our clinic in 3 weeks.  Obesity [name] is currently in the action stage of change. As such, her goal is to continue with weight loss efforts She has agreed to keep a food journal with 1150-1300 calories and 85+ grams of protein daily or follow the Category 2 plan [name] has been instructed to work up to a goal of 150 minutes of combined cardio and strengthening exercise per week for weight loss  and overall health benefits. We discussed the following Behavioral Modification Strategies today: increasing lean protein intake, increasing vegetables and work on meal planning and easy cooking plans, keeping healthy foods in the home, better snacking choices, and planning for success   [name] has agreed to follow up with our clinic in 3 weeks. She was informed of the importance of frequent  follow up visits to maximize her success with intensive lifestyle modifications for her multiple health conditions.  ALLERGIES: Allergies  Allergen Reactions  . Codeine     REACTION: Nausea  . Cyclosporine Swelling    MEDICATIONS: Current Outpatient Medications on File Prior to Visit  Medication Sig Dispense Refill  . acetaminophen (TYLENOL) 500 MG tablet Take 500 mg by mouth every 8 (eight) hours as needed (pain).     [name] atorvastatin (LIPITOR) 80 MG tablet TAKE 1 TABLET BY MOUTH EVERY DAY 30 tablet 0  . chlorzoxazone (PARAFON) 500 MG tablet Take by mouth 4 (four) times daily as needed for muscle spasms.    . fluorometholone (FML) 0.1 % ophthalmic suspension     . HYDROcodone-acetaminophen (NORCO/VICODIN) 5-325 MG tablet Take one tablet by mouth every 6 hours as needed for pain 16 tablet 0  . hydroxychloroquine (PLAQUENIL) 200 MG tablet Take 1 tablet (200 mg total) by mouth 2 (two) times daily. 60 tablet 2  . lisinopril (ZESTRIL) 10 MG tablet TAKE 1 TABLET BY MOUTH EVERY DAY 90 tablet 1  . OVER THE COUNTER MEDICATION daily.     [name] Propylene Glycol (SYSTANE BALANCE OP) Apply to eye.    [name] BESIVANCE 0.6 % SUSP     . cimetidine (TAGAMET) 200 MG tablet Take 200 mg by mouth 2 (two) times daily.     Current Facility-Administered Medications on File Prior to Visit  Medication Dose Route Frequency Provider Last Rate Last Dose  . methylPREDNISolone acetate (DEPO-MEDROL) injection 40 mg  40 mg Intra-articular Once [name], MD        PAST MEDICAL HISTORY: Past Medical History:  Diagnosis Date  . Allergic rhinitis   . Anxiety   . Arthritis   . Asthma   . Constipation   . Depression   . Gallbladder problem   . Hematuria 08/25/2015  . Hyperlipidemia   . Hypertension   . Obesity   . Osteoarthritis   . Pelvic pain in female 08/25/2015  . Rheumatoid arthritis ([place])   . Swallowing difficulty   . Swelling     PAST SURGICAL HISTORY: Past Surgical History:  Procedure Laterality  Date  . BACK SURGERY    . CHOLECYSTECTOMY    . COLONOSCOPY  2007   sessile sigmoid polyp x 1; path: serrated adenoma  . COLONOSCOPY N/A 10/12/2014   Procedure: COLONOSCOPY;  Surgeon: [name], MD;  Location: [facility];  Service: Endoscopy;  Laterality: N/A;  . JOINT REPLACEMENT     BIL knee   . KNEE ARTHROPLASTY    . KNEE SURGERY     bilateral knee replacement  . LIPOMA EXCISION     x 2  . TONSILLECTOMY AND ADENOIDECTOMY      SOCIAL HISTORY: Social History   Tobacco Use  . Smoking status: Never Smoker  . Smokeless tobacco: Never Used  Substance Use Topics  . Alcohol use: Yes    Alcohol/week: 0.0 standard drinks    Comment: sometimes; rarely  . Drug use: No    FAMILY HISTORY: Family History  Problem Relation Age of Onset  . Heart attack Father   .  Alcoholism Father   . Depression Father   . Heart disease Father   . Hyperlipidemia Father   . Hypertension Father   . Leukemia Mother   . Hypertension Mother   . Cancer Mother   . Depression Mother   . Obesity Sister   . Other Sister        breathing problems  . Hepatitis C Sister   . Arthritis Sister   . Depression Sister   . Emphysema Brother   . Obesity Daughter   . Polycystic ovary syndrome Daughter   . Obesity Son   . Other Son        knee problems  . Cancer Maternal Grandmother        breast  . Other Brother        aneursym  . Arthritis Sister   . Depression Sister   . Obesity Son   . Asthma Son   . Obesity Son   . Diabetes Other        runs on dad's side of the family  . Colon cancer Neg Hx        states she doesn't know a lot about her family, but has never heard of specific colon ca    ROS: Review of Systems  Constitutional: Positive for malaise/fatigue. Negative for weight loss.  Cardiovascular: Negative for chest pain.  Gastrointestinal: Negative for nausea and vomiting.  Musculoskeletal:       Negative muscle weakness    PHYSICAL EXAM: Pt in no acute distress  RECENT LABS  AND TESTS: BMET    Component Value Date/Time   NA 142 04/03/2019 1030   K 5.1 04/03/2019 1030   CL 105 04/03/2019 1030   CO2 24 04/03/2019 1030   GLUCOSE 82 04/03/2019 1030   GLUCOSE 89 10/30/2018 1422   BUN 20 04/03/2019 1030   CREATININE 0.91 04/03/2019 1030   CREATININE 0.95 10/30/2018 1422   CALCIUM 10.5 (H) 04/03/2019 1030   GFRNONAA 65 04/03/2019 1030   GFRNONAA 62 10/30/2018 1422   GFRAA 76 04/03/2019 1030   GFRAA 72 10/30/2018 1422   Lab Results  Component Value Date   HGBA1C 5.4 02/27/2019   HGBA1C 5.5 10/31/2018   Lab Results  Component Value Date   INSULIN 8.3 02/27/2019   INSULIN 12.3 10/31/2018   CBC    Component Value Date/Time   WBC 6.0 10/30/2018 1422   RBC 4.65 10/30/2018 1422   HGB 12.7 10/30/2018 1422   HGB 11.9 04/05/2018 1119   HCT 38.9 10/30/2018 1422   HCT 37.5 04/05/2018 1119   PLT 227 10/30/2018 1422   PLT 235 04/05/2018 1119   MCV 83.7 10/30/2018 1422   MCV 88 04/05/2018 1119   MCH 27.3 10/30/2018 1422   MCHC 32.6 10/30/2018 1422   RDW 14.9 10/30/2018 1422   RDW 18.4 (H) 04/05/2018 1119   LYMPHSABS 1,704 10/30/2018 1422   LYMPHSABS 1.0 04/05/2018 1119   MONOABS 924 03/06/2017 0923   EOSABS 108 10/30/2018 1422   EOSABS 0.1 04/05/2018 1119   BASOSABS 30 10/30/2018 1422   BASOSABS 0.0 04/05/2018 1119   Iron/TIBC/Ferritin/ %Sat No results found for: IRON, TIBC, FERRITIN, IRONPCTSAT Lipid Panel     Component Value Date/Time   CHOL 211 (H) 02/27/2019 1556   TRIG 52 02/27/2019 1556   HDL 57 02/27/2019 1556   CHOLHDL 3.8 11/06/2017 1041   CHOLHDL 6.6 07/09/2014 0819   VLDL 13 07/09/2014 0819   LDLCALC 144 (H) 02/27/2019 1556   Hepatic Function  Panel     Component Value Date/Time   PROT 6.7 04/03/2019 1030   ALBUMIN 4.0 04/03/2019 1030   AST 19 04/03/2019 1030   ALT 16 04/03/2019 1030   ALKPHOS 75 04/03/2019 1030   BILITOT 0.6 04/03/2019 1030   BILIDIR 0.15 12/20/2016 0857   IBILI 0.4 07/09/2014 0819      Component Value  Date/Time   TSH 1.180 10/31/2018 1253   TSH 0.67 03/06/2017 0923   TSH 1.899 03/18/2009 0000      I, Trixie Dredge, am acting as transcriptionist for Ilene Qua, MD  I have reviewed the above documentation for accuracy and completeness, and I agree with the above. - Ilene Qua, MD

## 2019-05-13 ENCOUNTER — Other Ambulatory Visit: Payer: Self-pay

## 2019-05-13 ENCOUNTER — Ambulatory Visit (INDEPENDENT_AMBULATORY_CARE_PROVIDER_SITE_OTHER): Payer: PPO | Admitting: Psychiatry

## 2019-05-13 DIAGNOSIS — F331 Major depressive disorder, recurrent, moderate: Secondary | ICD-10-CM
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
## 2019-05-22 ENCOUNTER — Encounter (INDEPENDENT_AMBULATORY_CARE_PROVIDER_SITE_OTHER): Payer: Self-pay | Admitting: Family Medicine

## 2019-05-22 ENCOUNTER — Other Ambulatory Visit: Payer: Self-pay

## 2019-05-22 ENCOUNTER — Telehealth (INDEPENDENT_AMBULATORY_CARE_PROVIDER_SITE_OTHER): Payer: PPO | Admitting: Family Medicine

## 2019-05-22 DIAGNOSIS — Z6841 Body Mass Index (BMI) 40.0 and over, adult: Secondary | ICD-10-CM | POA: Diagnosis not present

## 2019-05-22 DIAGNOSIS — E785 Hyperlipidemia, unspecified: Secondary | ICD-10-CM

## 2019-05-22 DIAGNOSIS — I10 Essential (primary) hypertension: Secondary | ICD-10-CM | POA: Diagnosis not present

## 2019-05-22 MED ORDER — ATORVASTATIN CALCIUM 80 MG PO TABS: ORAL_TABLET | ORAL | 0 refills | Status: DC

## 2019-05-24 ENCOUNTER — Other Ambulatory Visit: Payer: Self-pay | Admitting: Physician Assistant

## 2019-05-24 DIAGNOSIS — M0579 Rheumatoid arthritis with rheumatoid factor of multiple sites without organ or systems involvement: Secondary | ICD-10-CM

## 2019-05-26 NOTE — Telephone Encounter (Addendum)
Last Visit: 02/06/19 Next Visit: due November 2020. Message sent to the front to schedule patient.  Labs: 10/30/18 Calcium is elevated-10.6. Rest of lab work is WNL.  Eye exam:  03/28/2019 WNL

## 2019-05-26 NOTE — Telephone Encounter (Signed)
Please schedule patient for a follow up visit. Patient due November 2020. Thanks! 

## 2019-05-26 NOTE — Progress Notes (Signed)
Office: [phone]  /  Fax: [phone] TeleHealth Visit:  [name] has verbally consented to this TeleHealth visit today. The patient is located at home, the provider is located at the [facility] office. The participants in this visit include the listed provider, patient, and the patient's husband, [name]. The visit was conducted today via FaceTime.  HPI:   Chief Complaint: OBESITY [name] is here to discuss her progress with her obesity treatment plan. She is on the Category 2 plan and is following her eating plan approximately 0% of the time. She states she is walking and doing chair exercises 10-30 minutes 3 times per week. [name] has been monitoring calories and protein instead of following her plan, eating approximately 915 calories and between 60 and 95 grams of protein each day. She reports that sometimes she just doesn't feel like eating. She went to [employer] today. Her weight was reported at 240.2 lbs this a.m. She has made 2 separate recipes - a casserole and no bake cookies. We were unable to weigh the patient today for this TeleHealth visit. She feels as if she has lost weight since her last visit. She has lost 24 lbs since starting treatment with [place].  Hyperlipidemia [name] has hyperlipidemia and is on alternating days of a higher statin dose. She has been trying to improve her cholesterol levels with intensive lifestyle modification including a low saturated fat diet, exercise and weight loss. She denies any chest pain or claudication, but reports minimal myalgias.  Hypertension [name] is a 68 y.o. female with hypertension.  [name] denies chest pain, chest pressure, or headache. She is working weight loss to help control her blood pressure with the goal of decreasing her risk of heart attack and stroke. [name]'s blood pressure is controlled today.  ASSESSMENT AND PLAN:  Hyperlipidemia LDL goal <130 - Plan: atorvastatin (LIPITOR)  80 MG tablet  Essential hypertension  Class 3 severe obesity with serious comorbidity and body mass index (BMI) of 40.0 to 44.9 in adult, unspecified obesity type ([place])  PLAN:  Hyperlipidemia [name] was informed of the American Heart Association Guidelines emphasizing intensive lifestyle modifications as the first line treatment for hyperlipidemia. We discussed many lifestyle modifications today in depth, and [name] will continue to work on decreasing saturated fats such as fatty red meat, butter and many fried foods. [name] was given a refill on her atorvastatin 80 mg daily #30 with 0 refills and agrees to follow-up with our clinic in 4 weeks. She will also increase vegetables and lean protein in her diet and continue to work on exercise and weight loss efforts.  Hypertension We discussed sodium restriction, working on healthy weight loss, and a regular exercise program as the means to achieve improved blood pressure control. [name] agreed with this plan and agreed to follow up as directed. We will continue to monitor her blood pressure as well as her progress with the above lifestyle modifications. She will continue her medications as prescribed and will watch for signs of hypotension as she continues her lifestyle modifications.  Obesity [name] is currently in the action stage of change. As such, her goal is to continue with weight loss efforts. She has agreed to follow the Category 2 plan. [name] has been instructed to increase her time of activity 10 minutes the days she does do activity/chair exercise 2 times per week. for weight loss and overall health benefits. We discussed the following Behavioral Modification Strategies today: increasing lean protein  intake, increasing vegetables, work on meal planning and easy cooking plans, keeping healthy foods in the home, and planning for success.  [name] has agreed to follow-up with our clinic in 4 weeks. She was informed of the importance  of frequent follow-up visits to maximize her success with intensive lifestyle modifications for her multiple health conditions.  ALLERGIES: Allergies  Allergen Reactions  . Codeine     REACTION: Nausea  . Cyclosporine Swelling    MEDICATIONS: Current Outpatient Medications on File Prior to Visit  Medication Sig Dispense Refill  . acetaminophen (TYLENOL) 500 MG tablet Take 500 mg by mouth every 8 (eight) hours as needed (pain).     . chlorzoxazone (PARAFON) 500 MG tablet Take by mouth 4 (four) times daily as needed for muscle spasms.    . fluorometholone (FML) 0.1 % ophthalmic suspension     . hydroxychloroquine (PLAQUENIL) 200 MG tablet Take 1 tablet (200 mg total) by mouth 2 (two) times daily. 60 tablet 2  . lisinopril (ZESTRIL) 10 MG tablet TAKE 1 TABLET BY MOUTH EVERY DAY 90 tablet 1  . OVER THE COUNTER MEDICATION daily.     [name] Propylene Glycol (SYSTANE BALANCE OP) Apply to eye.    . Vitamin D, Ergocalciferol, (DRISDOL) 1.25 MG (50000 UT) CAPS capsule Take 1 capsule (50,000 Units total) by mouth every 7 (seven) days. 12 capsule 0   Current Facility-Administered Medications on File Prior to Visit  Medication Dose Route Frequency Provider Last Rate Last Dose  . methylPREDNISolone acetate (DEPO-MEDROL) injection 40 mg  40 mg Intra-articular Once [name], MD        PAST MEDICAL HISTORY: Past Medical History:  Diagnosis Date  . Allergic rhinitis   . Anxiety   . Arthritis   . Asthma   . Constipation   . Depression   . Gallbladder problem   . Hematuria 08/25/2015  . Hyperlipidemia   . Hypertension   . Obesity   . Osteoarthritis   . Pelvic pain in female 08/25/2015  . Rheumatoid arthritis ([place])   . Swallowing difficulty   . Swelling     PAST SURGICAL HISTORY: Past Surgical History:  Procedure Laterality Date  . BACK SURGERY    . CHOLECYSTECTOMY    . COLONOSCOPY  2007   sessile sigmoid polyp x 1; path: serrated adenoma  . COLONOSCOPY N/A 10/12/2014   Procedure:  COLONOSCOPY;  Surgeon: [name], MD;  Location: [facility];  Service: Endoscopy;  Laterality: N/A;  . JOINT REPLACEMENT     BIL knee   . KNEE ARTHROPLASTY    . KNEE SURGERY     bilateral knee replacement  . LIPOMA EXCISION     x 2  . TONSILLECTOMY AND ADENOIDECTOMY      SOCIAL HISTORY: Social History   Tobacco Use  . Smoking status: Never Smoker  . Smokeless tobacco: Never Used  Substance Use Topics  . Alcohol use: Yes    Alcohol/week: 0.0 standard drinks    Comment: sometimes; rarely  . Drug use: No    FAMILY HISTORY: Family History  Problem Relation Age of Onset  . Heart attack Father   . Alcoholism Father   . Depression Father   . Heart disease Father   . Hyperlipidemia Father   . Hypertension Father   . Leukemia Mother   . Hypertension Mother   . Cancer Mother   . Depression Mother   . Obesity Sister   . Other Sister  breathing problems  . Hepatitis C Sister   . Arthritis Sister   . Depression Sister   . Emphysema Brother   . Obesity Daughter   . Polycystic ovary syndrome Daughter   . Obesity Son   . Other Son        knee problems  . Cancer Maternal Grandmother        breast  . Other Brother        aneursym  . Arthritis Sister   . Depression Sister   . Obesity Son   . Asthma Son   . Obesity Son   . Diabetes Other        runs on dad's side of the family  . Colon cancer Neg Hx        states she doesn't know a lot about her family, but has never heard of specific colon ca   ROS: Review of Systems  Cardiovascular: Negative for chest pain and claudication.       Negative for chest pressure.  Musculoskeletal: Positive for myalgias (minimal).  Neurological: Negative for headaches.   PHYSICAL EXAM: Pt in no acute distress  RECENT LABS AND TESTS: BMET    Component Value Date/Time   NA 142 04/03/2019 1030   K 5.1 04/03/2019 1030   CL 105 04/03/2019 1030   CO2 24 04/03/2019 1030   GLUCOSE 82 04/03/2019 1030   GLUCOSE 89  10/30/2018 1422   BUN 20 04/03/2019 1030   CREATININE 0.91 04/03/2019 1030   CREATININE 0.95 10/30/2018 1422   CALCIUM 10.5 (H) 04/03/2019 1030   GFRNONAA 65 04/03/2019 1030   GFRNONAA 62 10/30/2018 1422   GFRAA 76 04/03/2019 1030   GFRAA 72 10/30/2018 1422   Lab Results  Component Value Date   HGBA1C 5.4 02/27/2019   HGBA1C 5.5 10/31/2018   Lab Results  Component Value Date   INSULIN 8.3 02/27/2019   INSULIN 12.3 10/31/2018   CBC    Component Value Date/Time   WBC 6.0 10/30/2018 1422   RBC 4.65 10/30/2018 1422   HGB 12.7 10/30/2018 1422   HGB 11.9 04/05/2018 1119   HCT 38.9 10/30/2018 1422   HCT 37.5 04/05/2018 1119   PLT 227 10/30/2018 1422   PLT 235 04/05/2018 1119   MCV 83.7 10/30/2018 1422   MCV 88 04/05/2018 1119   MCH 27.3 10/30/2018 1422   MCHC 32.6 10/30/2018 1422   RDW 14.9 10/30/2018 1422   RDW 18.4 (H) 04/05/2018 1119   LYMPHSABS 1,704 10/30/2018 1422   LYMPHSABS 1.0 04/05/2018 1119   MONOABS 924 03/06/2017 0923   EOSABS 108 10/30/2018 1422   EOSABS 0.1 04/05/2018 1119   BASOSABS 30 10/30/2018 1422   BASOSABS 0.0 04/05/2018 1119   Iron/TIBC/Ferritin/ %Sat No results found for: IRON, TIBC, FERRITIN, IRONPCTSAT Lipid Panel     Component Value Date/Time   CHOL 211 (H) 02/27/2019 1556   TRIG 52 02/27/2019 1556   HDL 57 02/27/2019 1556   CHOLHDL 3.8 11/06/2017 1041   CHOLHDL 6.6 07/09/2014 0819   VLDL 13 07/09/2014 0819   LDLCALC 144 (H) 02/27/2019 1556   Hepatic Function Panel     Component Value Date/Time   PROT 6.7 04/03/2019 1030   ALBUMIN 4.0 04/03/2019 1030   AST 19 04/03/2019 1030   ALT 16 04/03/2019 1030   ALKPHOS 75 04/03/2019 1030   BILITOT 0.6 04/03/2019 1030   BILIDIR 0.15 12/20/2016 0857   IBILI 0.4 07/09/2014 0819      Component Value Date/Time  TSH 1.180 10/31/2018 1253   TSH 0.67 03/06/2017 0923   TSH 1.899 03/18/2009 0000   Results for DEMYA, GREGORIAN (MRN [id number]) as of 05/26/2019 11:41  Ref. Range 02/27/2019 15:56   Vitamin D, 25-Hydroxy Latest Ref Range: 30.0 - 100.0 ng/mL 41.6    I, Michaelene Song, am acting as Location manager for Ilene Qua, MD  I have reviewed the above documentation for accuracy and completeness, and I agree with the above. - Ilene Qua, MD

## 2019-05-26 NOTE — Telephone Encounter (Signed)
I LMOM for patient to call, and schedule her 5 month follow up appointment with Dr. Estanislado Pandy in November.

## 2019-05-27 ENCOUNTER — Ambulatory Visit (INDEPENDENT_AMBULATORY_CARE_PROVIDER_SITE_OTHER): Payer: PPO | Admitting: Psychiatry

## 2019-05-27 ENCOUNTER — Other Ambulatory Visit: Payer: Self-pay

## 2019-05-27 DIAGNOSIS — F331 Major depressive disorder, recurrent, moderate: Secondary | ICD-10-CM
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
## 2019-06-10 ENCOUNTER — Other Ambulatory Visit (HOSPITAL_COMMUNITY): Payer: Self-pay | Admitting: Family Medicine

## 2019-06-10 DIAGNOSIS — Z1231 Encounter for screening mammogram for malignant neoplasm of breast: Secondary | ICD-10-CM

## 2019-06-15 ENCOUNTER — Other Ambulatory Visit (INDEPENDENT_AMBULATORY_CARE_PROVIDER_SITE_OTHER): Payer: Self-pay | Admitting: Family Medicine

## 2019-06-15 DIAGNOSIS — E785 Hyperlipidemia, unspecified: Secondary | ICD-10-CM

## 2019-06-16 ENCOUNTER — Ambulatory Visit (INDEPENDENT_AMBULATORY_CARE_PROVIDER_SITE_OTHER): Payer: PPO | Admitting: Psychiatry

## 2019-06-16 ENCOUNTER — Other Ambulatory Visit: Payer: Self-pay

## 2019-06-16 ENCOUNTER — Encounter (HOSPITAL_COMMUNITY): Payer: Self-pay | Admitting: Psychiatry

## 2019-06-16 DIAGNOSIS — F331 Major depressive disorder, recurrent, moderate: Secondary | ICD-10-CM | POA: Diagnosis not present
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
## 2019-06-19 ENCOUNTER — Other Ambulatory Visit: Payer: Self-pay

## 2019-06-19 ENCOUNTER — Ambulatory Visit (INDEPENDENT_AMBULATORY_CARE_PROVIDER_SITE_OTHER): Payer: PPO | Admitting: Family Medicine

## 2019-06-19 ENCOUNTER — Encounter (INDEPENDENT_AMBULATORY_CARE_PROVIDER_SITE_OTHER): Payer: Self-pay | Admitting: Family Medicine

## 2019-06-19 DIAGNOSIS — Z6841 Body Mass Index (BMI) 40.0 and over, adult: Secondary | ICD-10-CM

## 2019-06-19 DIAGNOSIS — F418 Other specified anxiety disorders: Secondary | ICD-10-CM | POA: Diagnosis not present

## 2019-06-19 NOTE — Progress Notes (Signed)
Office: [phone]  /  Fax: [phone] TeleHealth Visit:  [name] has verbally consented to this TeleHealth visit today. The patient is located at home, the provider is located at the [employer] [facility] office. The participants in this visit include the listed provider and patient. Siane was unable to use Face Time today and the telehealth visit was conducted via telephone. The visit was 12 minutes.  HPI:   Chief Complaint: OBESITY [name] is here to discuss her progress with her obesity treatment plan. She is not following a plan, but is watching her protein and calories. She states she is walking 10 minutes 7 times per week. [name] loves to cook and the Category 2 plan made her feel very restricted. She has been trying to keep calories around 1300 and keep protein at 90 calories rather than following category 2. [name] denies polyphagia.  We were unable to weigh the patient today for this TeleHealth visit. She feels as if she has maintained weight since her last visit. She has lost 24 lbs since starting treatment with [place].  Depression with Anxiety [name] is working with a counselor because she has tended to ignore feelings and eat instead. She opted for counseling rather than medication. She has been working on behavior modification techniques to help reduce her emotional eating and has been somewhat successful.   Depression screen [facility] 2/9 10/31/2018 02/11/2018 05/21/2017  Decreased Interest 1 1 3   Down, Depressed, Hopeless 0 1 2  PHQ - 2 Score 1 2 5   Altered sleeping 0 2 2  Tired, decreased energy 0 2 3  Change in appetite 2 2 2   Feeling bad or failure about yourself  0 1 2  Trouble concentrating 0 1 2  Moving slowly or fidgety/restless 1 2 0  Suicidal thoughts 0 0 -  PHQ-9 Score 4 12 16   Difficult doing work/chores Not difficult at all Not difficult at all Somewhat difficult   ASSESSMENT AND PLAN:  Depression with anxiety  Class 3 severe obesity with  serious comorbidity and body mass index (BMI) of 40.0 to 44.9 in adult, unspecified obesity type (HCC)  PLAN:  Depression with Emotional Eating Behaviors We discussed behavior modification techniques today to help [name] deal with her emotional eating and depression. She has agreed to continue to see the counselor to discuss overeating and agreed to follow up as directed in 4 weeks.  Obesity [name] is currently in the action stage of change. As such, her goal is to continue with weight loss efforts. She has agreed to keep a food journal with 1200 to 1300 calories and 85 grams of protein daily. [name] has been instructed to work up to a goal of 150 minutes of combined cardio and strengthening exercise per week for weight loss and overall health benefits. We discussed the following Behavioral Modification Strategies today: increasing lean protein intake, planning for success, keeping a strict food journal, and holiday eating strategies   [name] has agreed to follow up with our clinic in 4 weeks. She was informed of the importance of frequent follow up visits to maximize her success with intensive lifestyle modifications for her multiple health conditions.  ALLERGIES: Allergies  Allergen Reactions  . Codeine     REACTION: Nausea  . Cyclosporine Swelling    MEDICATIONS: Current Outpatient Medications on File Prior to Visit  Medication Sig Dispense Refill  . acetaminophen (TYLENOL) 500 MG tablet Take 500 mg by mouth every 8 (eight) hours as needed (pain).     [name]  atorvastatin (LIPITOR) 80 MG tablet TAKE 1 TABLET BY MOUTH EVERY DAY 30 tablet 0  . chlorzoxazone (PARAFON) 500 MG tablet Take by mouth 4 (four) times daily as needed for muscle spasms.    . fluorometholone (FML) 0.1 % ophthalmic suspension     . hydroxychloroquine (PLAQUENIL) 200 MG tablet TAKE 1 TABLET BY MOUTH TWICE A DAY 60 tablet 0  . lisinopril (ZESTRIL) 10 MG tablet TAKE 1 TABLET BY MOUTH EVERY DAY 90 tablet 1  . OVER  THE COUNTER MEDICATION daily.     [name] Propylene Glycol (SYSTANE BALANCE OP) Apply to eye.    . Vitamin D, Ergocalciferol, (DRISDOL) 1.25 MG (50000 UT) CAPS capsule Take 1 capsule (50,000 Units total) by mouth every 7 (seven) days. 12 capsule 0   Current Facility-Administered Medications on File Prior to Visit  Medication Dose Route Frequency Provider Last Rate Last Dose  . methylPREDNISolone acetate (DEPO-MEDROL) injection 40 mg  40 mg Intra-articular Once [name], MD        PAST MEDICAL HISTORY: Past Medical History:  Diagnosis Date  . Allergic rhinitis   . Anxiety   . Arthritis   . Asthma   . Constipation   . Depression   . Gallbladder problem   . Hematuria 08/25/2015  . Hyperlipidemia   . Hypertension   . Obesity   . Osteoarthritis   . Pelvic pain in female 08/25/2015  . Rheumatoid arthritis ([place])   . Swallowing difficulty   . Swelling     PAST SURGICAL HISTORY: Past Surgical History:  Procedure Laterality Date  . BACK SURGERY    . CHOLECYSTECTOMY    . COLONOSCOPY  2007   sessile sigmoid polyp x 1; path: serrated adenoma  . COLONOSCOPY N/A 10/12/2014   Procedure: COLONOSCOPY;  Surgeon: [name], MD;  Location: [facility];  Service: Endoscopy;  Laterality: N/A;  . JOINT REPLACEMENT     BIL knee   . KNEE ARTHROPLASTY    . KNEE SURGERY     bilateral knee replacement  . LIPOMA EXCISION     x 2  . TONSILLECTOMY AND ADENOIDECTOMY      SOCIAL HISTORY: Social History   Tobacco Use  . Smoking status: Never Smoker  . Smokeless tobacco: Never Used  Substance Use Topics  . Alcohol use: Yes    Alcohol/week: 0.0 standard drinks    Comment: sometimes; rarely  . Drug use: No    FAMILY HISTORY: Family History  Problem Relation Age of Onset  . Heart attack Father   . Alcoholism Father   . Depression Father   . Heart disease Father   . Hyperlipidemia Father   . Hypertension Father   . Leukemia Mother   . Hypertension Mother   . Cancer Mother   .  Depression Mother   . Obesity Sister   . Other Sister        breathing problems  . Hepatitis C Sister   . Arthritis Sister   . Depression Sister   . Emphysema Brother   . Obesity Daughter   . Polycystic ovary syndrome Daughter   . Obesity Son   . Other Son        knee problems  . Cancer Maternal Grandmother        breast  . Other Brother        aneursym  . Arthritis Sister   . Depression Sister   . Obesity Son   . Asthma Son   . Obesity  Son   . Diabetes Other        runs on dad's side of the family  . Colon cancer Neg Hx        states she doesn't know a lot about her family, but has never heard of specific colon ca    ROS: Review of Systems  Endo/Heme/Allergies:       Negative for polyphagia.  Psychiatric/Behavioral: Positive for depression. The patient is nervous/anxious.     PHYSICAL EXAM: Pt in no acute distress  RECENT LABS AND TESTS: BMET    Component Value Date/Time   NA 142 04/03/2019 1030   K 5.1 04/03/2019 1030   CL 105 04/03/2019 1030   CO2 24 04/03/2019 1030   GLUCOSE 82 04/03/2019 1030   GLUCOSE 89 10/30/2018 1422   BUN 20 04/03/2019 1030   CREATININE 0.91 04/03/2019 1030   CREATININE 0.95 10/30/2018 1422   CALCIUM 10.5 (H) 04/03/2019 1030   GFRNONAA 65 04/03/2019 1030   GFRNONAA 62 10/30/2018 1422   GFRAA 76 04/03/2019 1030   GFRAA 72 10/30/2018 1422   Lab Results  Component Value Date   HGBA1C 5.4 02/27/2019   HGBA1C 5.5 10/31/2018   Lab Results  Component Value Date   INSULIN 8.3 02/27/2019   INSULIN 12.3 10/31/2018   CBC    Component Value Date/Time   WBC 6.0 10/30/2018 1422   RBC 4.65 10/30/2018 1422   HGB 12.7 10/30/2018 1422   HGB 11.9 04/05/2018 1119   HCT 38.9 10/30/2018 1422   HCT 37.5 04/05/2018 1119   PLT 227 10/30/2018 1422   PLT 235 04/05/2018 1119   MCV 83.7 10/30/2018 1422   MCV 88 04/05/2018 1119   MCH 27.3 10/30/2018 1422   MCHC 32.6 10/30/2018 1422   RDW 14.9 10/30/2018 1422   RDW 18.4 (H) 04/05/2018  1119   LYMPHSABS 1,704 10/30/2018 1422   LYMPHSABS 1.0 04/05/2018 1119   MONOABS 924 03/06/2017 0923   EOSABS 108 10/30/2018 1422   EOSABS 0.1 04/05/2018 1119   BASOSABS 30 10/30/2018 1422   BASOSABS 0.0 04/05/2018 1119   Iron/TIBC/Ferritin/ %Sat No results found for: IRON, TIBC, FERRITIN, IRONPCTSAT Lipid Panel     Component Value Date/Time   CHOL 211 (H) 02/27/2019 1556   TRIG 52 02/27/2019 1556   HDL 57 02/27/2019 1556   CHOLHDL 3.8 11/06/2017 1041   CHOLHDL 6.6 07/09/2014 0819   VLDL 13 07/09/2014 0819   LDLCALC 144 (H) 02/27/2019 1556   Hepatic Function Panel     Component Value Date/Time   PROT 6.7 04/03/2019 1030   ALBUMIN 4.0 04/03/2019 1030   AST 19 04/03/2019 1030   ALT 16 04/03/2019 1030   ALKPHOS 75 04/03/2019 1030   BILITOT 0.6 04/03/2019 1030   BILIDIR 0.15 12/20/2016 0857   IBILI 0.4 07/09/2014 0819      Component Value Date/Time   TSH 1.180 10/31/2018 1253   TSH 0.67 03/06/2017 0923   TSH 1.899 03/18/2009 0000   Results for JAZELYNN, FRONING (MRN [id number]) as of 06/19/2019 09:57  Ref. Range 02/27/2019 15:56  Vitamin D, 25-Hydroxy Latest Ref Range: 30.0 - 100.0 ng/mL 41.6     I, Marcille Blanco, CMA, am acting as Location manager for [employer], FNP-C  I have reviewed the above documentation for accuracy and completeness, and I agree with the above.  -  , FNP-C.

## 2019-06-20 ENCOUNTER — Other Ambulatory Visit: Payer: Self-pay | Admitting: Rheumatology

## 2019-06-20 DIAGNOSIS — Z79899 Other long term (current) drug therapy: Secondary | ICD-10-CM

## 2019-06-20 DIAGNOSIS — M0579 Rheumatoid arthritis with rheumatoid factor of multiple sites without organ or systems involvement: Secondary | ICD-10-CM

## 2019-06-20 NOTE — Telephone Encounter (Addendum)
Last Visit: 01/27/19 Next Visit: 06/26/19 Labs: 04/03/19 Calcium 10.5, CBC 10/30/18 WNL Eye exam:  03/28/2019 WNL  Patient will update labs 06/23/19  Okay to refill per Dr. Estanislado Pandy

## 2019-06-23 DIAGNOSIS — Z79899 Other long term (current) drug therapy: Secondary | ICD-10-CM | POA: Diagnosis not present

## 2019-06-23 NOTE — Progress Notes (Signed)
Virtual Visit via Video Note  I connected with [name] on 06/26/19 at  3:45 PM EST by a video enabled telemedicine application and verified that I am speaking with the correct person using two identifiers.  Location: Patient: At home Provider: In the office This service was conducted via virtual visit.  Both audio and visual tools were used.  The patient was located at home. I was located in my office.  Consent was obtained prior to the virtual visit and is aware of possible charges through their insurance for this visit.  The patient is an established patient.  Dr. Estanislado Pandy, MD conducted the virtual visit .  Office staff helped with scheduling follow up visits after the service was conducted.  Patient's video did not work and we had to conduct televisit.   I discussed the limitations of evaluation and management by telemedicine and the availability of in person appointments. The patient expressed understanding and agreed to proceed.  CC: Pain and stiffness in hands and other joints  History of Present Illness: Patient is a 68 year old female with a past medical history of seropositive rheumatoid arthritis.  She is taking PLQ 200 mg 1 tablet BID.  According to patient she continues to have some stiffness in her hands and feet.  She does not see any visible swelling.  She has some discomfort in her bilateral shoulders.  Her bilateral total knee replacements are doing well.  She has been having some stiffness in her neck and lower back.  She has right trochanteric bursitis off and on.  SI joint pain persist.  She had eye examination to monitor for Plaquenil toxicity on April 07, 2019.  She has been taking Plaquenil on a regular basis.  Review of Systems  Constitutional: Negative for fever and malaise/fatigue.  Eyes: Negative for photophobia, pain, discharge and redness.  Respiratory: Negative for cough, shortness of breath and wheezing.   Cardiovascular: Negative for chest pain and  palpitations.  Gastrointestinal: Negative for blood in stool, constipation and diarrhea.  Genitourinary: Negative for dysuria.  Musculoskeletal: Positive for joint pain and neck pain. Negative for back pain and myalgias.  Skin: Negative for rash.  Neurological: Negative for dizziness and headaches.  Psychiatric/Behavioral: Positive for depression. The patient is nervous/anxious and has insomnia.       Observations/Objective:  Patient reports morning stiffness for 2 hours.   Patient reports nocturnal pain.  Difficulty dressing/grooming: Reports Difficulty climbing stairs: Reports Difficulty getting out of chair: Reports Difficulty using hands for taps, buttons, cutlery, and/or writing: Reports  Assessment and Plan: Visit Diagnoses:Seropositive rheumatoid arthritis of multiple sites ([place])- positive RF, positive anti-CCP. Previous patient of Dr. Charlestine Night with multiple contractures, ultrasound of both hands on 10/30/18 revealed synovitis: Patient has been taking Plaquenil on regular basis.  She states she continues to have some changes in her hands and feet but no swelling.  She still have nocturnal pain and discomfort.  It is difficult to assess synovitis in her joints without an office visit.  At this point she would prefer to do only virtual visits.  She will come for an in office visit only when the pandemic is over.  High risk medication use -She has been off of Rasuvo 20 mg since September 2019.  She is taking PLQ 200 mg 1 tablet BID.  Last Plaquenil eye exam per patient was on April 07, 2019 which was normal.  Her most recent labs in October were normal.  History of bilateral knee replacement:Doing well.  DDD (degenerative disc disease), lumbar - s/p fusion:She continues to have some chronic pain.  Trochanteric bursitis of right hip:She has intermittent pain.  I have advised her to try some stretching exercises.   Chronic right SI joint: Chronic pain persist.  Other  medical conditions are listed as follows:  Osteopenia of multiple sites  Essential hypertension  History of hyperlipidemia  History of MRSA infection  History of adenomatous polyp of colon  History of anxiety  History of depression  History of asthma  Follow Up Instructions:    I discussed the assessment and treatment plan with the patient. The patient was provided an opportunity to ask questions and all were answered. The patient agreed with the plan and demonstrated an understanding of the instructions.   The patient was advised to call back or seek an in-person evaluation if the symptoms worsen or if the condition fails to improve as anticipated.  I provided 15 minutes of non-face-to-face time during this encounter.   [name], MD

## 2019-06-24 LAB — CBC WITH DIFFERENTIAL/PLATELET
Basophils Absolute: 0 10*3/uL (ref 0.0–0.2)
Basos: 0 %
EOS (ABSOLUTE): 0.1 10*3/uL (ref 0.0–0.4)
Eos: 2 %
Hematocrit: 42.5 % (ref 34.0–46.6)
Hemoglobin: 14.2 g/dL (ref 11.1–15.9)
Immature Grans (Abs): 0 10*3/uL (ref 0.0–0.1)
Immature Granulocytes: 0 %
Lymphocytes Absolute: 1.5 10*3/uL (ref 0.7–3.1)
Lymphs: 31 %
MCH: 30.5 pg (ref 26.6–33.0)
MCHC: 33.4 g/dL (ref 31.5–35.7)
MCV: 91 fL (ref 79–97)
Monocytes Absolute: 0.5 10*3/uL (ref 0.1–0.9)
Monocytes: 10 %
Neutrophils Absolute: 2.8 10*3/uL (ref 1.4–7.0)
Neutrophils: 57 %
Platelets: 190 10*3/uL (ref 150–450)
RBC: 4.66 x10E6/uL (ref 3.77–5.28)
RDW: 13.5 % (ref 11.7–15.4)
WBC: 4.8 10*3/uL (ref 3.4–10.8)

## 2019-06-24 LAB — CMP14+EGFR
ALT: 11 IU/L (ref 0–32)
AST: 14 IU/L (ref 0–40)
Albumin/Globulin Ratio: 2 (ref 1.2–2.2)
Albumin: 4.2 g/dL (ref 3.8–4.8)
Alkaline Phosphatase: 77 IU/L (ref 39–117)
BUN/Creatinine Ratio: 23 (ref 12–28)
BUN: 17 mg/dL (ref 8–27)
Bilirubin Total: 0.4 mg/dL (ref 0.0–1.2)
CO2: 25 mmol/L (ref 20–29)
Calcium: 10.4 mg/dL — ABNORMAL HIGH (ref 8.7–10.3)
Chloride: 106 mmol/L (ref 96–106)
Creatinine, Ser: 0.75 mg/dL (ref 0.57–1.00)
GFR calc Af Amer: 95 mL/min/{1.73_m2} (ref >59)
GFR calc non Af Amer: 82 mL/min/{1.73_m2} (ref >59)
Globulin, Total: 2.1 g/dL (ref 1.5–4.5)
Glucose: 92 mg/dL (ref 65–99)
Potassium: 5.2 mmol/L (ref 3.5–5.2)
Sodium: 142 mmol/L (ref 134–144)
Total Protein: 6.3 g/dL (ref 6.0–8.5)

## 2019-06-24 NOTE — Telephone Encounter (Signed)
Calcium stays mildly elevated.  Avoid extra calcium intake.  Total calcium intake should be 1200 mg a day between diet and calcium intake.

## 2019-06-25 ENCOUNTER — Encounter: Payer: Self-pay | Admitting: Rheumatology

## 2019-06-26 ENCOUNTER — Telehealth (INDEPENDENT_AMBULATORY_CARE_PROVIDER_SITE_OTHER): Payer: PPO | Admitting: Rheumatology

## 2019-06-26 ENCOUNTER — Other Ambulatory Visit: Payer: Self-pay

## 2019-06-26 ENCOUNTER — Telehealth: Payer: Self-pay | Admitting: *Deleted

## 2019-06-26 ENCOUNTER — Ambulatory Visit (HOSPITAL_COMMUNITY)
Admission: RE | Admit: 2019-06-26 | Discharge: 2019-06-26 | Disposition: A | Discharge status: Home / Self Care | Payer: PPO | Source: Ambulatory Visit | Attending: Family Medicine | Admitting: Family Medicine

## 2019-06-26 DIAGNOSIS — Z96653 Presence of artificial knee joint, bilateral: Secondary | ICD-10-CM

## 2019-06-26 DIAGNOSIS — Z79899 Other long term (current) drug therapy: Secondary | ICD-10-CM | POA: Diagnosis not present

## 2019-06-26 DIAGNOSIS — Z8659 Personal history of other mental and behavioral disorders: Secondary | ICD-10-CM | POA: Diagnosis not present

## 2019-06-26 DIAGNOSIS — Z8614 Personal history of Methicillin resistant Staphylococcus aureus infection: Secondary | ICD-10-CM

## 2019-06-26 DIAGNOSIS — Z8639 Personal history of other endocrine, nutritional and metabolic disease: Secondary | ICD-10-CM

## 2019-06-26 DIAGNOSIS — M5136 Other intervertebral disc degeneration, lumbar region: Secondary | ICD-10-CM

## 2019-06-26 DIAGNOSIS — I10 Essential (primary) hypertension: Secondary | ICD-10-CM | POA: Diagnosis not present

## 2019-06-26 DIAGNOSIS — M51369 Other intervertebral disc degeneration, lumbar region without mention of lumbar back pain or lower extremity pain: Secondary | ICD-10-CM

## 2019-06-26 DIAGNOSIS — M7061 Trochanteric bursitis, right hip: Secondary | ICD-10-CM | POA: Diagnosis not present

## 2019-06-26 DIAGNOSIS — Z1231 Encounter for screening mammogram for malignant neoplasm of breast: Secondary | ICD-10-CM | POA: Insufficient documentation

## 2019-06-26 DIAGNOSIS — M0579 Rheumatoid arthritis with rheumatoid factor of multiple sites without organ or systems involvement: Secondary | ICD-10-CM | POA: Diagnosis not present

## 2019-06-26 DIAGNOSIS — M8589 Other specified disorders of bone density and structure, multiple sites: Secondary | ICD-10-CM

## 2019-06-26 DIAGNOSIS — Z8709 Personal history of other diseases of the respiratory system: Secondary | ICD-10-CM

## 2019-06-26 NOTE — Telephone Encounter (Signed)
Please schedule virtual visit for 4 months. Thank you.

## 2019-06-30 ENCOUNTER — Other Ambulatory Visit (HOSPITAL_COMMUNITY): Payer: Self-pay | Admitting: Family Medicine

## 2019-06-30 DIAGNOSIS — R928 Other abnormal and inconclusive findings on diagnostic imaging of breast: Secondary | ICD-10-CM

## 2019-07-01 ENCOUNTER — Other Ambulatory Visit: Payer: Self-pay

## 2019-07-01 ENCOUNTER — Ambulatory Visit (HOSPITAL_COMMUNITY)
Admission: RE | Admit: 2019-07-01 | Discharge: 2019-07-01 | Disposition: A | Discharge status: Home / Self Care | Payer: PPO | Source: Ambulatory Visit | Attending: Family Medicine | Admitting: Family Medicine

## 2019-07-01 DIAGNOSIS — R928 Other abnormal and inconclusive findings on diagnostic imaging of breast: Secondary | ICD-10-CM

## 2019-07-01 DIAGNOSIS — N6314 Unspecified lump in the right breast, lower inner quadrant: Secondary | ICD-10-CM | POA: Diagnosis not present

## 2019-07-03 ENCOUNTER — Encounter: Payer: Self-pay | Admitting: Family Medicine

## 2019-07-07 ENCOUNTER — Other Ambulatory Visit: Payer: Self-pay

## 2019-07-07 ENCOUNTER — Ambulatory Visit (INDEPENDENT_AMBULATORY_CARE_PROVIDER_SITE_OTHER): Payer: PPO | Admitting: Psychiatry

## 2019-07-07 DIAGNOSIS — F331 Major depressive disorder, recurrent, moderate: Secondary | ICD-10-CM | POA: Diagnosis not present
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
## 2019-07-09 ENCOUNTER — Ambulatory Visit (INDEPENDENT_AMBULATORY_CARE_PROVIDER_SITE_OTHER): Payer: PPO | Admitting: Family Medicine

## 2019-07-09 ENCOUNTER — Other Ambulatory Visit: Payer: Self-pay

## 2019-07-09 ENCOUNTER — Encounter: Payer: Self-pay | Admitting: Family Medicine

## 2019-07-09 DIAGNOSIS — I10 Essential (primary) hypertension: Secondary | ICD-10-CM

## 2019-07-09 DIAGNOSIS — M7541 Impingement syndrome of right shoulder: Secondary | ICD-10-CM | POA: Diagnosis not present

## 2019-07-09 MED ORDER — METHYLPREDNISOLONE ACETATE 40 MG/ML IJ SUSP: 40.0000 mg | Freq: Once | INTRAMUSCULAR | Status: DC

## 2019-07-09 NOTE — Progress Notes (Signed)
   Subjective:    Patient ID: [name], female    DOB: Apr 03, 1951, 68 y.o.   MRN: [id number]  HPI  Patient arrives with right shoulder pain. Patient states she has had a steroid injection in the past that helped and would like another one today if if possible.  Progressive shoulder pain.  Injection in the past helped.  Took a fall exacerbated.  Sees a rheumatologist on a regular basis for her rheumatoid arthritis.  Recent scare with abnormal mammogram.  Thankfully follow-up ultrasound in a focused manner showed benign process.  Sees wellness/weight loss/folks monthly.  They recently have seen managing liquids by changing medications and monitoring.  Blood pressure medicine and blood pressure levels reviewed today with patient. Compliant with blood pressure medicine. States does not miss a dose. No obvious side effects. Blood pressure generally good when checked elsewhere. Watching salt intake.     Review of Systems No headache no chest pain no shortness of breath    Objective:   Physical Exam Alert active good hydration lungs clear.  Heart regular rhythm.  Blood pressure very good on repeat.  Obesity present but BMI improving.  Right shoulder positive impingement sign.  Patient was prepped draped injected with 1 cc Depo-Medrol 2 cc Xylocaine posterior lateral approach into the right shoulder       Assessment & Plan:  Impression 1 chronic impingement right shoulder.  Codman's exercises discussed.  Hopefully injection will help  2.  Hypertension good control discussed maintain same  3.  Hyperlipidemia.  Advised patient there should not be more than 1 captain of this should be at any given time.  Currently the wellness/weight/?  Ottie Glazier have a assumed management of this  4.  Mental health.  Patient now receiving counseling which I think is a good idea and appears to be helpful  Follow-up in 6 months

## 2019-07-14 ENCOUNTER — Other Ambulatory Visit: Payer: Self-pay

## 2019-07-24 ENCOUNTER — Telehealth (INDEPENDENT_AMBULATORY_CARE_PROVIDER_SITE_OTHER): Payer: PPO | Admitting: Family Medicine

## 2019-07-24 ENCOUNTER — Other Ambulatory Visit: Payer: Self-pay

## 2019-07-24 ENCOUNTER — Encounter (INDEPENDENT_AMBULATORY_CARE_PROVIDER_SITE_OTHER): Payer: Self-pay | Admitting: Family Medicine

## 2019-07-24 DIAGNOSIS — E559 Vitamin D deficiency, unspecified: Secondary | ICD-10-CM | POA: Diagnosis not present

## 2019-07-24 DIAGNOSIS — Z6841 Body Mass Index (BMI) 40.0 and over, adult: Secondary | ICD-10-CM

## 2019-07-24 DIAGNOSIS — F418 Other specified anxiety disorders: Secondary | ICD-10-CM | POA: Diagnosis not present

## 2019-07-24 MED ORDER — VITAMIN D (ERGOCALCIFEROL) 1.25 MG (50000 UNIT) PO CAPS: 50000.0000 [IU] | ORAL_CAPSULE | ORAL | 0 refills | Status: DC

## 2019-07-28 ENCOUNTER — Other Ambulatory Visit: Payer: Self-pay

## 2019-07-28 ENCOUNTER — Ambulatory Visit (INDEPENDENT_AMBULATORY_CARE_PROVIDER_SITE_OTHER): Payer: PPO | Admitting: Psychiatry

## 2019-07-28 DIAGNOSIS — F331 Major depressive disorder, recurrent, moderate: Secondary | ICD-10-CM

## 2019-07-28 NOTE — Progress Notes (Signed)
Office: [phone]  /  Fax: [phone] TeleHealth Visit:  [name] has verbally consented to this TeleHealth visit today. The patient is located at home, the provider is located at the [employer] [facility] office. The participants in this visit include the listed provider, patient, husband [name] and any and all parties involved. The visit was conducted today via FaceTime.  HPI:   Chief Complaint: OBESITY [name] is here to discuss her progress with her obesity treatment plan. She is on the keep a food journal with 1200 to 1300 calories and 85 grams of protein daily and she is following her eating plan approximately 0 % of the time. She states she is walking 10 minutes 3 times per week. [name] did very well over Thanksgiving and reports she has lost some weight. [name] meets her protein goals about 6 out of 7 days per week. She is journaling daily. We were unable to weigh the patient today for this TeleHealth visit. She feels as if she has lost weight since her last visit (weight 237 lbs 07/24/19). She has lost 24 lbs since starting treatment with [place].  Vitamin D deficiency [name] has a diagnosis of vitamin D deficiency. Her last vitamin D level was at 41.6 (02/27/19) and was not at goal. [name] is currently taking vit D and she denies nausea, vomiting or muscle weakness.  Depression with anxiety [name] is working on emotional eating with her counselor and she reports that it is quite hard. [name] reports eating, rather than feeling her emotions. [name] struggles with emotional eating and using food for comfort to the extent that it is negatively impacting her health.  She has been working on behavior modification techniques to help reduce her emotional eating and has been somewhat successful. She shows no sign of suicidal or homicidal ideations.  ASSESSMENT AND PLAN:  Depression with anxiety  Vitamin D deficiency - Plan: Vitamin D, Ergocalciferol, (DRISDOL) 1.25 MG  (50000 UT) CAPS capsule  Class 3 severe obesity with serious comorbidity and body mass index (BMI) of 45.0 to 49.9 in adult, unspecified obesity type (HCC)  PLAN:  Vitamin D Deficiency Low vitamin D level contributes to fatigue and are associated with obesity, breast, and colon cancer. [name] agrees to continue to take prescription Vit D @50 ,000 IU every week #4 with no refills and she will follow up for routine testing of vitamin D, at least 2-3 times per year. She was informed of the risk of over-replacement of vitamin D. [name] agrees to follow up as directed.   Depression with anxiety [name] will continue with the counselor and she will follow up with our clinic in 5 weeks..  Obesity [name] is currently in the action stage of change. As such, her goal is to continue with weight loss efforts She has agreed to keep a food journal with 1200 to 1300 calories and 85 grams of protein daily [name] will increase walking for weight loss and overall health benefits. We discussed the following Behavioral Modification Strategies today: planning for success, keep a strict food journal, increasing lean protein intake and decreasing simple carbohydrates   [name] has agreed to follow up with our clinic in 5 weeks. She was informed of the importance of frequent follow up visits to maximize her success with intensive lifestyle modifications for her multiple health conditions.  ALLERGIES: Allergies  Allergen Reactions  . Codeine     REACTION: Nausea  . Cyclosporine Swelling    MEDICATIONS: Current Outpatient Medications on File Prior to  Visit  Medication Sig Dispense Refill  . acetaminophen (TYLENOL) 500 MG tablet Take 500 mg by mouth every 8 (eight) hours as needed (pain).     [name] atorvastatin (LIPITOR) 80 MG tablet TAKE 1 TABLET BY MOUTH EVERY DAY 30 tablet 0  . chlorzoxazone (PARAFON) 500 MG tablet Take by mouth 4 (four) times daily as needed for muscle spasms.    . fluorometholone (FML)  0.1 % ophthalmic suspension     . hydroxychloroquine (PLAQUENIL) 200 MG tablet TAKE 1 TABLET BY MOUTH TWICE A DAY 60 tablet 0  . lisinopril (ZESTRIL) 10 MG tablet TAKE 1 TABLET BY MOUTH EVERY DAY 90 tablet 1  . OVER THE COUNTER MEDICATION daily.     [name] Propylene Glycol (SYSTANE BALANCE OP) Apply to eye.     Current Facility-Administered Medications on File Prior to Visit  Medication Dose Route Frequency Provider Last Rate Last Dose  . methylPREDNISolone acetate (DEPO-MEDROL) injection 40 mg  40 mg Intra-articular Once [name], MD      . methylPREDNISolone acetate (DEPO-MEDROL) injection 40 mg  40 mg Intra-articular Once [name], MD        PAST MEDICAL HISTORY: Past Medical History:  Diagnosis Date  . Allergic rhinitis   . Anxiety   . Arthritis   . Asthma   . Constipation   . Depression   . Gallbladder problem   . Hematuria 08/25/2015  . Hyperlipidemia   . Hypertension   . Obesity   . Osteoarthritis   . Pelvic pain in female 08/25/2015  . Rheumatoid arthritis ([place])   . Swallowing difficulty   . Swelling     PAST SURGICAL HISTORY: Past Surgical History:  Procedure Laterality Date  . BACK SURGERY    . CHOLECYSTECTOMY    . COLONOSCOPY  2007   sessile sigmoid polyp x 1; path: serrated adenoma  . COLONOSCOPY N/A 10/12/2014   Procedure: COLONOSCOPY;  Surgeon: [name], MD;  [facility];  Service: Endoscopy;  Laterality: N/A;  . JOINT REPLACEMENT     BIL knee   . KNEE ARTHROPLASTY    . KNEE SURGERY     bilateral knee replacement  . LIPOMA EXCISION     x 2  . TONSILLECTOMY AND ADENOIDECTOMY      SOCIAL HISTORY: Social History   Tobacco Use  . Smoking status: Never Smoker  . Smokeless tobacco: Never Used  Substance Use Topics  . Alcohol use: Yes    Alcohol/week: 0.0 standard drinks    Comment: sometimes; rarely  . Drug use: No    FAMILY HISTORY: Family History  Problem Relation Age of Onset  . Heart attack Father   .  Alcoholism Father   . Depression Father   . Heart disease Father   . Hyperlipidemia Father   . Hypertension Father   . Leukemia Mother   . Hypertension Mother   . Cancer Mother   . Depression Mother   . Obesity Sister   . Other Sister        breathing problems  . Hepatitis C Sister   . Arthritis Sister   . Depression Sister   . Emphysema Brother   . Obesity Daughter   . Polycystic ovary syndrome Daughter   . Obesity Son   . Other Son        knee problems  . Cancer Maternal Grandmother        breast  . Other Brother  aneursym  . Arthritis Sister   . Depression Sister   . Obesity Son   . Asthma Son   . Obesity Son   . Diabetes Other        runs on dad's side of the family  . Colon cancer Neg Hx        states she doesn't know a lot about her family, but has never heard of specific colon ca    ROS: Review of Systems  Constitutional: Positive for weight loss.  Gastrointestinal: Negative for nausea and vomiting.  Musculoskeletal:       Negative for muscle weakness  Psychiatric/Behavioral: Positive for depression. Negative for suicidal ideas. The patient is nervous/anxious.     PHYSICAL EXAM: Pt in no acute distress  RECENT LABS AND TESTS: BMET    Component Value Date/Time   NA 142 06/23/2019 0853   K 5.2 06/23/2019 0853   CL 106 06/23/2019 0853   CO2 25 06/23/2019 0853   GLUCOSE 92 06/23/2019 0853   GLUCOSE 89 10/30/2018 1422   BUN 17 06/23/2019 0853   CREATININE 0.75 06/23/2019 0853   CREATININE 0.95 10/30/2018 1422   CALCIUM 10.4 (H) 06/23/2019 0853   GFRNONAA 82 06/23/2019 0853   GFRNONAA 62 10/30/2018 1422   GFRAA 95 06/23/2019 0853   GFRAA 72 10/30/2018 1422   Lab Results  Component Value Date   HGBA1C 5.4 02/27/2019   HGBA1C 5.5 10/31/2018   Lab Results  Component Value Date   INSULIN 8.3 02/27/2019   INSULIN 12.3 10/31/2018   CBC    Component Value Date/Time   WBC 4.8 06/23/2019 0853   WBC 6.0 10/30/2018 1422   RBC 4.66  06/23/2019 0853   RBC 4.65 10/30/2018 1422   HGB 14.2 06/23/2019 0853   HCT 42.5 06/23/2019 0853   PLT 190 06/23/2019 0853   MCV 91 06/23/2019 0853   MCH 30.5 06/23/2019 0853   MCH 27.3 10/30/2018 1422   MCHC 33.4 06/23/2019 0853   MCHC 32.6 10/30/2018 1422   RDW 13.5 06/23/2019 0853   LYMPHSABS 1.5 06/23/2019 0853   MONOABS 924 03/06/2017 0923   EOSABS 0.1 06/23/2019 0853   BASOSABS 0.0 06/23/2019 0853   Iron/TIBC/Ferritin/ %Sat No results found for: IRON, TIBC, FERRITIN, IRONPCTSAT Lipid Panel     Component Value Date/Time   CHOL 211 (H) 02/27/2019 1556   TRIG 52 02/27/2019 1556   HDL 57 02/27/2019 1556   CHOLHDL 3.8 11/06/2017 1041   CHOLHDL 6.6 07/09/2014 0819   VLDL 13 07/09/2014 0819   LDLCALC 144 (H) 02/27/2019 1556   Hepatic Function Panel     Component Value Date/Time   PROT 6.3 06/23/2019 0853   ALBUMIN 4.2 06/23/2019 0853   AST 14 06/23/2019 0853   ALT 11 06/23/2019 0853   ALKPHOS 77 06/23/2019 0853   BILITOT 0.4 06/23/2019 0853   BILIDIR 0.15 12/20/2016 0857   IBILI 0.4 07/09/2014 0819      Component Value Date/Time   TSH 1.180 10/31/2018 1253   TSH 0.67 03/06/2017 0923   TSH 1.899 03/18/2009 0000     Ref. Range 02/27/2019 15:56  Vitamin D, 25-Hydroxy Latest Ref Range: 30.0 - 100.0 ng/mL 41.6    I, Doreene Nest, am acting as Location manager for [employer], FNP-C.  I have reviewed the above documentation for accuracy and completeness, and I agree with the above.  -  , FNP-C.
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
## 2019-07-29 DIAGNOSIS — E559 Vitamin D deficiency, unspecified: Secondary | ICD-10-CM | POA: Insufficient documentation

## 2019-08-05 ENCOUNTER — Other Ambulatory Visit: Payer: Self-pay | Admitting: Family Medicine

## 2019-08-12 ENCOUNTER — Other Ambulatory Visit: Payer: Self-pay | Admitting: Rheumatology

## 2019-08-12 DIAGNOSIS — M0579 Rheumatoid arthritis with rheumatoid factor of multiple sites without organ or systems involvement: Secondary | ICD-10-CM

## 2019-08-12 NOTE — Telephone Encounter (Signed)
Last Visit: 06/26/2019 telemedicine  Next Visit: 11/06/2019 Labs: 06/23/2019 Eye exam: 03/28/2019   Okay to refill per Dr. Estanislado Pandy.

## 2019-08-25 ENCOUNTER — Encounter (INDEPENDENT_AMBULATORY_CARE_PROVIDER_SITE_OTHER): Payer: Self-pay | Admitting: Family Medicine

## 2019-08-25 ENCOUNTER — Telehealth (INDEPENDENT_AMBULATORY_CARE_PROVIDER_SITE_OTHER): Payer: PPO | Admitting: Family Medicine

## 2019-08-25 ENCOUNTER — Other Ambulatory Visit: Payer: Self-pay

## 2019-08-25 DIAGNOSIS — Z6841 Body Mass Index (BMI) 40.0 and over, adult: Secondary | ICD-10-CM | POA: Diagnosis not present

## 2019-08-25 DIAGNOSIS — I10 Essential (primary) hypertension: Secondary | ICD-10-CM

## 2019-08-25 DIAGNOSIS — E559 Vitamin D deficiency, unspecified: Secondary | ICD-10-CM | POA: Diagnosis not present

## 2019-08-25 MED ORDER — VITAMIN D (ERGOCALCIFEROL) 1.25 MG (50000 UNIT) PO CAPS: 50000.0000 [IU] | ORAL_CAPSULE | ORAL | 0 refills | Status: DC

## 2019-08-26 ENCOUNTER — Telehealth (INDEPENDENT_AMBULATORY_CARE_PROVIDER_SITE_OTHER): Payer: PPO | Admitting: Family Medicine

## 2019-08-26 DIAGNOSIS — L851 Acquired keratosis [keratoderma] palmaris et plantaris: Secondary | ICD-10-CM | POA: Diagnosis not present

## 2019-08-26 DIAGNOSIS — L603 Nail dystrophy: Secondary | ICD-10-CM | POA: Diagnosis not present

## 2019-08-26 NOTE — Progress Notes (Addendum)
Office: [phone]  /  Fax: [phone] TeleHealth Visit:  [name] has verbally consented to this TeleHealth visit today. The patient is located at home, the provider is located at the [employer] [facility] office. The participants in this visit include the listed provider, patient and spouse [name] and any and all parties involved. The visit was conducted today via FaceTime.  HPI:  Chief Complaint: OBESITY [name] is here to discuss her progress with her obesity treatment plan. She is on the keep a food journal of 1200 to 1300 calories and 85 grams of protein daily plan and states she is following her eating plan approximately 90 % of the time. She states she is doing weight exercises 20 minutes, 3 times per week and chair exercises 20 minutes 2 times per week.  [name] had started exercising recently and has discovered that she has a lot of aches and pains after exercise. She is doing 20 minutes, 2 times per week. She has been off the plan, but she is now back on the plan. [name] admits to having too many sweets over the holidays.  Vitamin D deficiency [name] has a diagnosis of vitamin D deficiency. Her last vitamin D level was 41.6 on 02/27/19 and was nearly at goal.  Hypertension [name] has a diagnosis of hypertension which is well controlled on 10 mg of Lisinopril. Her blood pressure at home today is 126/86.  ASSESSMENT AND PLAN:  Vitamin D deficiency - Plan: Vitamin D, Ergocalciferol, (DRISDOL) 1.25 MG (50000 UT) CAPS capsule  Essential hypertension  Class 3 severe obesity with serious comorbidity and body mass index (BMI) of 40.0 to 44.9 in adult, unspecified obesity type ([place])  PLAN:  Vitamin D deficiency Low Vitamin D level contributes to fatigue and are associated with obesity, breast, and colon cancer. [name] agrees to continue to take prescription Vitamin D @50 ,000 IU every week #12 with Catherine refills and she will follow-up for routine testing of vitamin D,  at least 2-3 times per year to avoid over-replacement. We will check vitamin D level at the next in-office visit and [name] agrees to follow up as directed.  Hypertension [name] is working on healthy weight loss and exercise to improve blood pressure control. [name] will continue Lisinopril. We will watch for signs of hypotension as she continues her lifestyle modifications.  Obesity [name] is currently in the action stage of change. As such, her goal is to continue with weight loss efforts. She has agreed to keep a food journal and adhere to recommended goals of 1200 to 1300 calories and 85 grams of protein daily. [name] has been instructed to decrease exercise to 15 minutes, 2 times per week since she is having quite a few aches and pains with current regimen. We discussed doing more walking. We discussed the following Behavioral Modification Strategies today: decreasing simple carbohydrates , planning for success and keeping a strict food journal. Handout was sent to patient via MyChart for Category 1 and Category 2 breakfast options.  [name] has agreed to follow-up with our clinic in 4 weeks. She was informed of the importance of frequent follow-up visits to maximize her success with intensive lifestyle modifications for her multiple health conditions.  ALLERGIES: Allergies  Allergen Reactions  . Codeine     REACTION: Nausea  . Cyclosporine Swelling    MEDICATIONS: Current Outpatient Medications on File Prior to Visit  Medication Sig Dispense Refill  . acetaminophen (TYLENOL) 500 MG tablet Take 500 mg by mouth every 8 (eight)  hours as needed (pain).     [name] atorvastatin (LIPITOR) 80 MG tablet TAKE 1 TABLET BY MOUTH EVERY DAY 30 tablet 0  . chlorzoxazone (PARAFON) 500 MG tablet Take by mouth 4 (four) times daily as needed for muscle spasms.    . fluorometholone (FML) 0.1 % ophthalmic suspension     . hydroxychloroquine (PLAQUENIL) 200 MG tablet TAKE 1 TABLET BY MOUTH TWICE A DAY  180 tablet 0  . lisinopril (ZESTRIL) 10 MG tablet TAKE 1 TABLET BY MOUTH EVERY DAY 90 tablet 1  . OVER THE COUNTER MEDICATION daily.     [name] Propylene Glycol (SYSTANE BALANCE OP) Apply to eye.     Current Facility-Administered Medications on File Prior to Visit  Medication Dose Route Frequency Provider Last Rate Last Admin  . methylPREDNISolone acetate (DEPO-MEDROL) injection 40 mg  40 mg Intra-articular Once [name], MD      . methylPREDNISolone acetate (DEPO-MEDROL) injection 40 mg  40 mg Intra-articular Once [name], MD        PAST MEDICAL HISTORY: Past Medical History:  Diagnosis Date  . Allergic rhinitis   . Anxiety   . Arthritis   . Asthma   . Constipation   . Depression   . Gallbladder problem   . Hematuria 08/25/2015  . Hyperlipidemia   . Hypertension   . Obesity   . Osteoarthritis   . Pelvic pain in female 08/25/2015  . Rheumatoid arthritis ([place])   . Swallowing difficulty   . Swelling     PAST SURGICAL HISTORY: Past Surgical History:  Procedure Laterality Date  . BACK SURGERY    . CHOLECYSTECTOMY    . COLONOSCOPY  2007   sessile sigmoid polyp x 1; path: serrated adenoma  . COLONOSCOPY N/A 10/12/2014   Procedure: COLONOSCOPY;  Surgeon: [name], MD;  Location[facility];  Service: Endoscopy;  Laterality: N/A;  . JOINT REPLACEMENT     BIL knee   . KNEE ARTHROPLASTY    . KNEE SURGERY     bilateral knee replacement  . LIPOMA EXCISION     x 2  . TONSILLECTOMY AND ADENOIDECTOMY      SOCIAL HISTORY: Social History   Tobacco Use  . Smoking status: Never Smoker  . Smokeless tobacco: Never Used  Substance Use Topics  . Alcohol use: Yes    Alcohol/week: 0.0 standard drinks    Comment: sometimes; rarely  . Drug use: Catherine    FAMILY HISTORY: Family History  Problem Relation Age of Onset  . Heart attack Father   . Alcoholism Father   . Depression Father   . Heart disease Father   . Hyperlipidemia Father   . Hypertension Father     . Leukemia Mother   . Hypertension Mother   . Cancer Mother   . Depression Mother   . Obesity Sister   . Other Sister        breathing problems  . Hepatitis C Sister   . Arthritis Sister   . Depression Sister   . Emphysema Brother   . Obesity Daughter   . Polycystic ovary syndrome Daughter   . Obesity Son   . Other Son        knee problems  . Cancer Maternal Grandmother        breast  . Other Brother        aneursym  . Arthritis Sister   . Depression Sister   . Obesity Son   . Asthma Son   .  Obesity Son   . Diabetes Other        runs on dad's side of the family  . Colon cancer Neg Hx        states she doesn't know a lot about her family, but has never heard of specific colon ca    ROS: Review of Systems  Constitutional: Negative for weight loss.    PHYSICAL EXAM: There were Catherine vitals taken for this visit. There is Catherine height or weight on file to calculate BMI. Physical Exam Constitutional:      General: She is not in acute distress.    Appearance: She is well-developed.  Neurological:     Mental Status: She is alert and oriented to person, place, and time.  Psychiatric:        Mood and Affect: Mood normal.        Behavior: Behavior normal.     RECENT LABS AND TESTS: BMET    Component Value Date/Time   NA 142 06/23/2019 0853   K 5.2 06/23/2019 0853   CL 106 06/23/2019 0853   CO2 25 06/23/2019 0853   GLUCOSE 92 06/23/2019 0853   GLUCOSE 89 10/30/2018 1422   BUN 17 06/23/2019 0853   CREATININE 0.75 06/23/2019 0853   CREATININE 0.95 10/30/2018 1422   CALCIUM 10.4 (H) 06/23/2019 0853   GFRNONAA 82 06/23/2019 0853   GFRNONAA 62 10/30/2018 1422   GFRAA 95 06/23/2019 0853   GFRAA 72 10/30/2018 1422   Lab Results  Component Value Date   HGBA1C 5.4 02/27/2019   HGBA1C 5.5 10/31/2018   Lab Results  Component Value Date   INSULIN 8.3 02/27/2019   INSULIN 12.3 10/31/2018   CBC    Component Value Date/Time   WBC 4.8 06/23/2019 0853   WBC 6.0  10/30/2018 1422   RBC 4.66 06/23/2019 0853   RBC 4.65 10/30/2018 1422   HGB 14.2 06/23/2019 0853   HCT 42.5 06/23/2019 0853   PLT 190 06/23/2019 0853   MCV 91 06/23/2019 0853   MCH 30.5 06/23/2019 0853   MCH 27.3 10/30/2018 1422   MCHC 33.4 06/23/2019 0853   MCHC 32.6 10/30/2018 1422   RDW 13.5 06/23/2019 0853   LYMPHSABS 1.5 06/23/2019 0853   MONOABS 924 03/06/2017 0923   EOSABS 0.1 06/23/2019 0853   BASOSABS 0.0 06/23/2019 0853   Iron/TIBC/Ferritin/ %Sat Catherine results [name] for: IRON, TIBC, FERRITIN, IRONPCTSAT Lipid Panel     Component Value Date/Time   CHOL 211 (H) 02/27/2019 1556   TRIG 52 02/27/2019 1556   HDL 57 02/27/2019 1556   CHOLHDL 3.8 11/06/2017 1041   CHOLHDL 6.6 07/09/2014 0819   VLDL 13 07/09/2014 0819   LDLCALC 144 (H) 02/27/2019 1556   Hepatic Function Panel     Component Value Date/Time   PROT 6.3 06/23/2019 0853   ALBUMIN 4.2 06/23/2019 0853   AST 14 06/23/2019 0853   ALT 11 06/23/2019 0853   ALKPHOS 77 06/23/2019 0853   BILITOT 0.4 06/23/2019 0853   BILIDIR 0.15 12/20/2016 0857   IBILI 0.4 07/09/2014 0819      Component Value Date/Time   TSH 1.180 10/31/2018 1253   TSH 0.67 03/06/2017 0923   TSH 1.899 03/18/2009 0000    Ref. Range 02/27/2019 15:56  Vitamin D, 25-Hydroxy Latest Ref Range: 30.0 - 100.0 ng/mL 41.6    I, Doreene Nest, am acting as Location manager for [employer], FNP-C  I have reviewed the above documentation for accuracy and completeness, and I agree with the  above.  - Jisele Price, FNP-C.

## 2019-08-28 ENCOUNTER — Ambulatory Visit (INDEPENDENT_AMBULATORY_CARE_PROVIDER_SITE_OTHER): Payer: PPO | Admitting: Psychiatry

## 2019-08-28 ENCOUNTER — Other Ambulatory Visit: Payer: Self-pay

## 2019-08-28 DIAGNOSIS — F331 Major depressive disorder, recurrent, moderate: Secondary | ICD-10-CM | POA: Diagnosis not present
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
## 2019-09-05 ENCOUNTER — Other Ambulatory Visit (INDEPENDENT_AMBULATORY_CARE_PROVIDER_SITE_OTHER): Payer: Self-pay | Admitting: Family Medicine

## 2019-09-05 DIAGNOSIS — E559 Vitamin D deficiency, unspecified: Secondary | ICD-10-CM

## 2019-09-05 DIAGNOSIS — E785 Hyperlipidemia, unspecified: Secondary | ICD-10-CM

## 2019-09-18 ENCOUNTER — Other Ambulatory Visit: Payer: Self-pay

## 2019-09-18 ENCOUNTER — Ambulatory Visit (INDEPENDENT_AMBULATORY_CARE_PROVIDER_SITE_OTHER): Payer: PPO | Admitting: Psychiatry

## 2019-09-18 DIAGNOSIS — F331 Major depressive disorder, recurrent, moderate: Secondary | ICD-10-CM
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
## 2019-09-22 ENCOUNTER — Encounter (INDEPENDENT_AMBULATORY_CARE_PROVIDER_SITE_OTHER): Payer: Self-pay | Admitting: Family Medicine

## 2019-09-22 ENCOUNTER — Other Ambulatory Visit: Payer: Self-pay

## 2019-09-22 ENCOUNTER — Telehealth (INDEPENDENT_AMBULATORY_CARE_PROVIDER_SITE_OTHER): Payer: PPO | Admitting: Family Medicine

## 2019-09-22 DIAGNOSIS — Z6841 Body Mass Index (BMI) 40.0 and over, adult: Secondary | ICD-10-CM | POA: Diagnosis not present

## 2019-09-22 DIAGNOSIS — E8881 Metabolic syndrome: Secondary | ICD-10-CM | POA: Diagnosis not present

## 2019-09-22 NOTE — Progress Notes (Signed)
TeleHealth Visit:  Due to the COVID-19 pandemic, this visit was completed with telemedicine (audio/video) technology to reduce patient and provider exposure as well as to preserve personal protective equipment.   [name] has verbally consented to this TeleHealth visit. The patient is located at home, the provider is located at the [facility] office. The participants in this visit include the listed provider, patient, husband [name]) and any and all parties involved. The visit was conducted today via FaceTime.  Chief Complaint: OBESITY [name] is here to discuss her progress with her obesity treatment plan along with follow-up of her obesity related diagnoses. [name] is on the Category 2 Plan and keeping a food journal of 1200 to 1300 calories and 93 grams of protein daily plan and states she is following her eating plan approximately 80% of the time. [name] states she is exercising to an exercise video 30 minutes 3 times per week (ballet moves).  Today's visit was #: 16 Starting weight: 267 lbs Starting date: 10/31/2018  Interim History: [name] reports weight loss and her weight today is 235 pounds (09/21/18). She is meeting her protein and calorie goals and. She had a question about whether she should lower her calories or not.  [name] has increased her exercise and reports she is enjoying it.  Subjective:   Insulin resistance [name] has a diagnosis of insulin resistance based on her elevated fasting insulin level 8.3. She continues to work on diet and exercise to decrease her risk of diabetes. [name] denies polyphagia. She is not on metformin. [name] reports that she does not have polyphagia if she eats adequate protein.  Lab Results  Component Value Date   INSULIN 8.3 02/27/2019   INSULIN 12.3 10/31/2018   Lab Results  Component Value Date   HGBA1C 5.4 02/27/2019    Assessment/Plan:   Insulin resistance [name] will continue with the meal plan  and she will continue to work on weight loss, exercise, and decreasing simple carbohydrates to help decrease the risk of diabetes. [name] agreed to follow-up with [place] as directed to closely monitor her progress.  Class 3 severe obesity with serious comorbidity and body mass index (BMI) of 45.0 to 49.9 in adult, unspecified obesity type [facility]) [name] is currently in the action stage of change. As such, her goal is to continue with weight loss efforts. She has agreed to the Category 2 Plan or keeping a food journal and adhering to recommended goals of 1200 to 1300 calories and 85 grams of protein daily.   Exercise goals:  [name] will continue her current exercise regimen.  Behavioral modification strategies: planning for success and keeping a strict food journal. We discussed that she should keep her calorie and protein goals the same, unless we instruct her otherwise.  [name] has agreed to follow-up with our clinic in 4 weeks with Dr. Adair Patter.   Objective:   VITALS: Per patient if applicable, see vitals. GENERAL: Alert and in no acute distress. CARDIOPULMONARY: No increased WOB. Speaking in clear sentences.  PSYCH: Pleasant and cooperative. Speech normal rate and rhythm. Affect is appropriate. Insight and judgement are appropriate. Attention is focused, linear, and appropriate.  NEURO: Oriented as arrived to appointment on time with no prompting.   Lab Results  Component Value Date   CREATININE 0.75 06/23/2019   BUN 17 06/23/2019   NA 142 06/23/2019   K 5.2 06/23/2019   CL 106 06/23/2019   CO2 25 06/23/2019   Lab Results  Component Value  Date   ALT 11 06/23/2019   AST 14 06/23/2019   ALKPHOS 77 06/23/2019   BILITOT 0.4 06/23/2019   Lab Results  Component Value Date   HGBA1C 5.4 02/27/2019   HGBA1C 5.5 10/31/2018   Lab Results  Component Value Date   INSULIN 8.3 02/27/2019   INSULIN 12.3 10/31/2018   Lab Results  Component Value Date   TSH 1.180 10/31/2018   Lab  Results  Component Value Date   CHOL 211 (H) 02/27/2019   HDL 57 02/27/2019   LDLCALC 144 (H) 02/27/2019   TRIG 52 02/27/2019   CHOLHDL 3.8 11/06/2017   Lab Results  Component Value Date   WBC 4.8 06/23/2019   HGB 14.2 06/23/2019   HCT 42.5 06/23/2019   MCV 91 06/23/2019   PLT 190 06/23/2019   No results found for: IRON, TIBC, FERRITIN   Ref. Range 02/27/2019 15:56  Vitamin D, 25-Hydroxy Latest Ref Range: 30.0 - 100.0 ng/mL 41.6   Attestation Statements:   Reviewed by clinician on day of visit: allergies, medications, problem list, medical history, surgical history, family history, social history, and previous encounter notes.  Corey Skains, am acting as Location manager for [employer], FNP-C.  I have reviewed the above documentation for accuracy and completeness, and I agree with the above. - Delitha Elms [employer], FNP-C

## 2019-09-23 ENCOUNTER — Encounter: Payer: Self-pay | Admitting: Gastroenterology

## 2019-09-25 ENCOUNTER — Encounter: Payer: Self-pay | Admitting: Family Medicine

## 2019-10-09 ENCOUNTER — Ambulatory Visit (HOSPITAL_COMMUNITY): Payer: PPO | Admitting: Psychiatry

## 2019-10-12 ENCOUNTER — Ambulatory Visit: Payer: PPO | Attending: Internal Medicine

## 2019-10-12 DIAGNOSIS — Z23 Encounter for immunization: Secondary | ICD-10-CM

## 2019-10-12 NOTE — Progress Notes (Signed)
   Covid-19 Vaccination Clinic  Name:  [name]    MRN: [id number] DOB: 1950-11-29  10/12/2019  [name] was observed post Covid-19 immunization for 15 minutes without incidence. She was provided with Vaccine Information Sheet and instruction to access the V-Safe system.   [name] was instructed to call 911 with any severe reactions post vaccine: [name] Difficulty breathing  . Swelling of your face and throat  . A fast heartbeat  . A bad rash all over your body  . Dizziness and weakness    Immunizations Administered    Name Date Dose VIS Date Route   Pfizer COVID-19 Vaccine 10/12/2019  8:58 AM 0.3 mL 08/01/2019 Intramuscular   Manufacturer: [place]   Lot: [id number]   [place]: [id number]

## 2019-10-16 ENCOUNTER — Encounter (INDEPENDENT_AMBULATORY_CARE_PROVIDER_SITE_OTHER): Payer: Self-pay | Admitting: Family Medicine

## 2019-10-16 ENCOUNTER — Encounter (HOSPITAL_COMMUNITY): Payer: Self-pay | Admitting: Psychiatry

## 2019-10-16 ENCOUNTER — Ambulatory Visit (INDEPENDENT_AMBULATORY_CARE_PROVIDER_SITE_OTHER): Payer: PPO | Admitting: Psychiatry

## 2019-10-16 ENCOUNTER — Other Ambulatory Visit: Payer: Self-pay

## 2019-10-16 ENCOUNTER — Telehealth (INDEPENDENT_AMBULATORY_CARE_PROVIDER_SITE_OTHER): Payer: PPO | Admitting: Family Medicine

## 2019-10-16 DIAGNOSIS — F331 Major depressive disorder, recurrent, moderate: Secondary | ICD-10-CM

## 2019-10-16 DIAGNOSIS — I10 Essential (primary) hypertension: Secondary | ICD-10-CM | POA: Diagnosis not present

## 2019-10-16 DIAGNOSIS — E785 Hyperlipidemia, unspecified: Secondary | ICD-10-CM | POA: Diagnosis not present

## 2019-10-16 DIAGNOSIS — Z6841 Body Mass Index (BMI) 40.0 and over, adult: Secondary | ICD-10-CM | POA: Diagnosis not present

## 2019-10-16 MED ORDER — ATORVASTATIN CALCIUM 80 MG PO TABS: 80.0000 mg | ORAL_TABLET | Freq: Every day | ORAL | 0 refills | Status: DC

## 2019-10-16 NOTE — Progress Notes (Signed)
TeleHealth Visit:  Due to the COVID-19 pandemic, this visit was completed with telemedicine (audio/video) technology to reduce patient and provider exposure as well as to preserve personal protective equipment.   [name] has verbally consented to this TeleHealth visit. The patient is located at home, the provider is located at the [facility] office. The participants in this visit include the listed provider, patient, spouse [name]) and any and all parties involved. The visit was conducted today via FaceTime.  Chief Complaint: OBESITY [name] is here to discuss her progress with her obesity treatment plan along with follow-up of her obesity related diagnoses. [name] is keeping a food journal of 1200 calories and 93 grams of protein daily and states she is following her eating plan approximately 98% of the time. 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  Today's visit was #: 17 Starting weight: 267 lbs Starting date: 10/31/2018  Interim History: Jully reports a weight of 235 pounds this morning (no change since the last appointment). [name] has incorporated some exercise into the regimen of two times per week. She is staying home most of the time. She received the first COVID vaccine. [name] is mostly journaling.  Subjective:   Hyperlipidemia LDL goal <130  [name] has hyperlipidemia and her last fasting lipid panel was in July 2020. Her LDL was at 144 on the last lab draw. She has been trying to improve her cholesterol levels with intensive lifestyle modification including a low saturated fat diet, exercise and weight loss.  Lab Results  Component Value Date   ALT 11 06/23/2019   AST 14 06/23/2019   ALKPHOS 77 06/23/2019   BILITOT 0.4 06/23/2019   Lab Results  Component Value Date   CHOL 211 (H) 02/27/2019   HDL 57 02/27/2019   LDLCALC 144 (H) 02/27/2019   TRIG 52 02/27/2019   CHOLHDL 3.8 11/06/2017    Essential hypertension [name]'s blood pressure is controlled today at 113/81. She denies chest pain, chest pressure, headache, lightheadedness or faintness.  BP Readings from Last 3 Encounters:  02/27/19 120/79  10/31/18 139/80  10/30/18 118/75   Lab Results  Component Value Date   CREATININE 0.75 06/23/2019   CREATININE 0.91 04/03/2019   CREATININE 0.73 02/27/2019   Assessment/Plan:   Hyperlipidemia LDL goal <130  Cardiovascular risk and specific lipid/LDL goals reviewed. We discussed several lifestyle modifications today and [name] will continue to work on diet, exercise and weight loss efforts. [name] will continue atorvastatin (LIPITOR) 80 MG PO once every other day #30 with no refills. Orders and follow up as documented in patient record.   Essential hypertension [name] is working on healthy weight loss and exercise to improve blood pressure control. [name] will continue Lisinopril (no refill needed). We will watch for signs of hypotension as she continues her lifestyle modifications.  Class 3 severe obesity with serious comorbidity and body mass index (BMI) of 40.0 to 44.9 in adult, unspecified obesity type [facility]) [name] is currently in the action stage of change. As such, her goal is to continue with weight loss efforts. She has agreed to the Category 2 Plan and keeping a food journal and adhering to recommended goals of 1200 calories and 90 grams of protein daily.   Behavioral modification strategies: increasing lean protein intake, increasing vegetables, meal planning and cooking strategies, keeping healthy foods in the home and planning for success.  [name] has agreed to follow-up with our clinic in  4 weeks. She was informed of the importance of frequent follow-up visits to maximize her success with intensive lifestyle modifications for her multiple health conditions.  Objective:   VITALS: Per patient if applicable, see vitals. GENERAL: Alert and in no acute  distress. CARDIOPULMONARY: No increased WOB. Speaking in clear sentences.  PSYCH: Pleasant and cooperative. Speech normal rate and rhythm. Affect is appropriate. Insight and judgement are appropriate. Attention is focused, linear, and appropriate.  NEURO: Oriented as arrived to appointment on time with no prompting.   Lab Results  Component Value Date   CREATININE 0.75 06/23/2019   BUN 17 06/23/2019   NA 142 06/23/2019   K 5.2 06/23/2019   CL 106 06/23/2019   CO2 25 06/23/2019   Lab Results  Component Value Date   ALT 11 06/23/2019   AST 14 06/23/2019   ALKPHOS 77 06/23/2019   BILITOT 0.4 06/23/2019   Lab Results  Component Value Date   HGBA1C 5.4 02/27/2019   HGBA1C 5.5 10/31/2018   Lab Results  Component Value Date   INSULIN 8.3 02/27/2019   INSULIN 12.3 10/31/2018   Lab Results  Component Value Date   TSH 1.180 10/31/2018   Lab Results  Component Value Date   CHOL 211 (H) 02/27/2019   HDL 57 02/27/2019   LDLCALC 144 (H) 02/27/2019   TRIG 52 02/27/2019   CHOLHDL 3.8 11/06/2017   Lab Results  Component Value Date   WBC 4.8 06/23/2019   HGB 14.2 06/23/2019   HCT 42.5 06/23/2019   MCV 91 06/23/2019   PLT 190 06/23/2019   No results found for: IRON, TIBC, FERRITIN   Ref. Range 02/27/2019 15:56  Vitamin D, 25-Hydroxy Latest Ref Range: 30.0 - 100.0 ng/mL 41.6   Attestation Statements:   Reviewed by clinician on day of visit: allergies, medications, problem list, medical history, surgical history, family history, social history, and previous encounter notes.  Time spent on visit including pre-visit chart review and post-visit care was 16 minutes.   I, Doreene Nest, am acting as transcriptionist for Coralie Common, MD.  I have reviewed the above documentation for accuracy and completeness, and I agree with the above. - Ilene Qua, MD
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
## 2019-10-23 ENCOUNTER — Other Ambulatory Visit: Payer: Self-pay

## 2019-10-23 ENCOUNTER — Encounter (HOSPITAL_COMMUNITY): Payer: Self-pay | Admitting: Psychiatry

## 2019-10-23 ENCOUNTER — Ambulatory Visit (INDEPENDENT_AMBULATORY_CARE_PROVIDER_SITE_OTHER): Payer: PPO | Admitting: Psychiatry

## 2019-10-23 DIAGNOSIS — F331 Major depressive disorder, recurrent, moderate: Secondary | ICD-10-CM | POA: Diagnosis not present
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
## 2019-10-31 NOTE — Progress Notes (Signed)
Virtual Visit via Video Note  I connected with [name] on 11/06/19 at  3:45 PM EDT by a video enabled telemedicine application and verified that I am speaking with the correct person using two identifiers.  Location: Patient: Home  Provider: Clinic  This service was conducted via virtual visit.  Both audio and visual tools were used.  The patient was located at home. I was located in my office.  Consent was obtained prior to the virtual visit and is aware of possible charges through their insurance for this visit.  The patient is an established patient.  Dr. Estanislado Pandy, MD conducted the virtual visit and Hazel Sams, PA-C acted as scribe during the service.  Office staff helped with scheduling follow up visits after the service was conducted.   I discussed the limitations of evaluation and management by telemedicine and the availability of in person appointments. The patient expressed understanding and agreed to proceed.  CC: Pain in multiple joints  History of Present Illness: Patient is a 69 year old female with a past medical history of seropositive rheumatoid arthritis and DDD.  She is taking plaquenil 200 mg 1 tablet by mouth twice daily.  She denies any joint swelling.  She has persistent pain in both shoulder joints, elbows, and both wrist joints. She continues to take tylenol as needed for pain relief.  She states both knee replacements are doing well.  She continues to have chronic lower back pain and is planning on following up with her neurosurgeon.   She states right trochanteric bursitis has resolved.     Review of Systems  Constitutional: Positive for malaise/fatigue. Negative for fever.  HENT: Negative for congestion.   Eyes: Positive for pain. Negative for photophobia, discharge and redness.  Respiratory: Negative for cough, shortness of breath and wheezing.   Cardiovascular: Negative for chest pain, palpitations and leg swelling.  Gastrointestinal: Negative for blood in  stool, constipation and diarrhea.  Genitourinary: Negative for dysuria and frequency.  Musculoskeletal: Positive for back pain and joint pain. Negative for myalgias and neck pain.       +Morning stiffness  Denies joint swelling  Skin: Positive for rash.  Neurological: Negative for dizziness and headaches.  Endo/Heme/Allergies: Does not bruise/bleed easily.  Psychiatric/Behavioral: Negative for depression. The patient is not nervous/anxious and does not have insomnia.       Observations/Objective: Physical Exam  Constitutional: She is oriented to person, place, and time and well-developed, well-nourished, and in no distress.  HENT:  Head: Normocephalic and atraumatic.  Eyes: Conjunctivae are normal.  Pulmonary/Chest: Effort normal.  Neurological: She is alert and oriented to person, place, and time.  Psychiatric: Mood, memory, affect and judgment normal.   Patient reports morning stiffness for 1 hour.   Patient denies reportsl pain.  Difficulty dressing/grooming: Denies Difficulty climbing stairs: Reports Difficulty getting out of chair: Reports Difficulty using hands for taps, buttons, cutlery, and/or writing: Reports   Assessment and Plan: Visit Diagnoses:Seropositive rheumatoid arthritis of multiple sites ([place])- positive RF, positive anti-CCP. Previous patient of Dr. Charlestine Night with multiple contractures, ultrasound of both hands on 10/30/18 revealed synovitis: She continues to have chronic pain in both shoulder joints, both elbow joints, and both wrist joints.  She has not noticed any joint swelling recently.  She continues to have morning stiffness lasting about 1 hour daily.  She takes Plaquenil 200 mg 1 tablet twice daily.  She takes Tylenol as needed for pain relief.  She is unsure if her rheumatoid arthritis is  well controlled due to her persistent discomfort.  She will continue taking Plaquenil as prescribed.  We will reassess at her in-office visit in 3 months.  High risk  medication use -She has been off of Rasuvo 20 mg since September 2019.  She is taking PLQ 200 mg 1 tablet by mouth twice daily.  PLQ Eye Exam: 04/07/2019 WNL @ [facility] Follow up in 1 year. CBC and CMP were drawn on 06/23/19.  She is due to update lab work in April and every 5 months.  Standing orders for CBC and CMP are placed today.  History of bilateral knee replacement:Doing well. She has no discomfort or joint swelling at this time. She continues to have difficulty climbing steps and getting up from a chair.  DDD (degenerative disc disease), lumbar - s/p fusion:She has chronic lower back pain and stiffness.  She is planning on following up with her neurosurgeon for further evaluation.  Trochanteric bursitis of right hip: Resolved   Chronic right SI joint: She has intermittent right SI joint pain.   Other medical conditions are listed as follows:  Osteopenia of multiple sites  Essential hypertension  History of hyperlipidemia  History of MRSA infection  History of adenomatous polyp of colon  History of anxiety  History of depression  History of asthma  Follow Up Instructions: She will follow up in 3 months   I discussed the assessment and treatment plan with the patient. The patient was provided an opportunity to ask questions and all were answered. The patient agreed with the plan and demonstrated an understanding of the instructions.   The patient was advised to call back or seek an in-person evaluation if the symptoms worsen or if the condition fails to improve as anticipated.  I provided 20 minutes of non-face-to-face time during this encounter.   [name], MD   Scribed by-  Hazel Sams, PA-C

## 2019-11-04 ENCOUNTER — Ambulatory Visit: Payer: PPO | Attending: Internal Medicine

## 2019-11-04 DIAGNOSIS — Z23 Encounter for immunization: Secondary | ICD-10-CM

## 2019-11-04 NOTE — Progress Notes (Signed)
   Covid-19 Vaccination Clinic  Name:  [name]    MRN: [id number] DOB: [birth year]  11/04/2019  [name] was observed post Covid-19 immunization for 15 minutes without incident. She was provided with Vaccine Information Sheet and instruction to access the V-Safe system.   [name] was instructed to call 911 with any severe reactions post vaccine: [name] Difficulty breathing  . Swelling of face and throat  . A fast heartbeat  . A bad rash all over body  . Dizziness and weakness   Immunizations Administered    Name Date Dose VIS Date Route   Pfizer COVID-19 Vaccine 11/04/2019  3:51 PM 0.3 mL 08/01/2019 Intramuscular   Manufacturer: [place]   Lot: [id number]   [place]: [id number]

## 2019-11-06 ENCOUNTER — Other Ambulatory Visit: Payer: Self-pay

## 2019-11-06 ENCOUNTER — Telehealth (INDEPENDENT_AMBULATORY_CARE_PROVIDER_SITE_OTHER): Payer: PPO | Admitting: Rheumatology

## 2019-11-06 ENCOUNTER — Encounter: Payer: Self-pay | Admitting: Rheumatology

## 2019-11-06 ENCOUNTER — Ambulatory Visit (INDEPENDENT_AMBULATORY_CARE_PROVIDER_SITE_OTHER): Payer: PPO | Admitting: Psychiatry

## 2019-11-06 ENCOUNTER — Encounter (HOSPITAL_COMMUNITY): Payer: Self-pay | Admitting: Psychiatry

## 2019-11-06 DIAGNOSIS — M7061 Trochanteric bursitis, right hip: Secondary | ICD-10-CM

## 2019-11-06 DIAGNOSIS — M8589 Other specified disorders of bone density and structure, multiple sites: Secondary | ICD-10-CM

## 2019-11-06 DIAGNOSIS — Z8709 Personal history of other diseases of the respiratory system: Secondary | ICD-10-CM

## 2019-11-06 DIAGNOSIS — Z86010 Personal history of colonic polyps: Secondary | ICD-10-CM

## 2019-11-06 DIAGNOSIS — Z8639 Personal history of other endocrine, nutritional and metabolic disease: Secondary | ICD-10-CM | POA: Diagnosis not present

## 2019-11-06 DIAGNOSIS — M5136 Other intervertebral disc degeneration, lumbar region: Secondary | ICD-10-CM | POA: Diagnosis not present

## 2019-11-06 DIAGNOSIS — Z8659 Personal history of other mental and behavioral disorders: Secondary | ICD-10-CM | POA: Diagnosis not present

## 2019-11-06 DIAGNOSIS — I10 Essential (primary) hypertension: Secondary | ICD-10-CM | POA: Diagnosis not present

## 2019-11-06 DIAGNOSIS — Z96653 Presence of artificial knee joint, bilateral: Secondary | ICD-10-CM

## 2019-11-06 DIAGNOSIS — Z79899 Other long term (current) drug therapy: Secondary | ICD-10-CM

## 2019-11-06 DIAGNOSIS — F331 Major depressive disorder, recurrent, moderate: Secondary | ICD-10-CM | POA: Diagnosis not present

## 2019-11-06 DIAGNOSIS — Z8614 Personal history of Methicillin resistant Staphylococcus aureus infection: Secondary | ICD-10-CM | POA: Diagnosis not present

## 2019-11-06 DIAGNOSIS — M0579 Rheumatoid arthritis with rheumatoid factor of multiple sites without organ or systems involvement: Secondary | ICD-10-CM | POA: Diagnosis not present
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
## 2019-11-07 ENCOUNTER — Encounter: Payer: Self-pay | Admitting: Nurse Practitioner

## 2019-11-07 ENCOUNTER — Ambulatory Visit (INDEPENDENT_AMBULATORY_CARE_PROVIDER_SITE_OTHER): Payer: PPO | Admitting: Nurse Practitioner

## 2019-11-07 ENCOUNTER — Other Ambulatory Visit: Payer: Self-pay

## 2019-11-07 VITALS — BP 122/80 | Temp 96.7°F | Wt 234.2 lb

## 2019-11-07 DIAGNOSIS — R21 Rash and other nonspecific skin eruption: Secondary | ICD-10-CM

## 2019-11-07 MED ORDER — CLOBETASOL PROPIONATE 0.05 % EX CREA: 1.0000 "application " | TOPICAL_CREAM | Freq: Two times a day (BID) | CUTANEOUS | 0 refills | Status: DC

## 2019-11-07 NOTE — Progress Notes (Signed)
   Subjective:    Patient ID: [name], female    DOB: [birth year], 69 y.o.   MRN: [id number]  Rash This is a recurrent problem. The current episode started more than 1 year ago. Location: back. The rash is characterized by itchiness (some raised areas and some itchy patches ). Treatments tried: regular body lotion hurts and burns, 10% hydrocortisone cream that does help a very small bit, very hot showers work the best       Review of Systems  Skin: Positive for rash.       Objective:   Physical Exam        Assessment & Plan:

## 2019-11-07 NOTE — Progress Notes (Signed)
   Acute Office Visit  Subjective:    Patient ID: [name], female    DOB: May 26, 1951, 69 y.o.   MRN: [id number]  Chief Complaint  Patient presents with  . Rash    Rash This is a new problem. The current episode started more than 1 year ago (Lasting 1 year). The problem is unchanged.   69 year old female presents today for a nonspecific large rash located on her lower mid back that has been ongoing and persistent for the last year. She describes the rash as itchy, burning, painless, and aggravating. She also states that the rash sometimes spreads to the upper mid area of her back but is mostly localized. She has tried multiple lotions, oils, creams which does not alleviate the rash at all. She states the only thing that alleviates the itching for a short period of time is hot water.    Review of Systems  Skin: Positive for rash.       Objective:    Physical Exam Skin:    General: Skin is warm and dry.     Findings: Ecchymosis and rash present.         Skin:  Large light pink localized rash located over mid lower region of back. Edges appear defined. There are multiple areas of patches, and some areas of mild papules but overall has a flat appearance. There are two distinct areas at the lower edge which show lichenification.        Assessment & Plan:   Problem List Items Addressed This Visit    None    Visit Diagnoses    Rash and nonspecific skin eruption    -  Primary       Meds ordered this encounter  Medications  . clobetasol cream (TEMOVATE) 0.05 %    Sig: Apply 1 application topically 2 (two) times daily. Prn rash up to 2 weeks    Dispense:  30 g    Refill:  0    Order Specific Question:   Supervising Provider    Answer:   [name] Drown [phone]   Education: Apply clobetasol cream twice a day for two weeks. If rash has not improved within 2 weeks please contact the office.                    Apply unscented lotion such as Cetaphil OTC as needed  but not at the same time as when you apply the steroid cream (clobetasol)   Gertie Fey, RN

## 2019-11-09 ENCOUNTER — Other Ambulatory Visit (INDEPENDENT_AMBULATORY_CARE_PROVIDER_SITE_OTHER): Payer: Self-pay | Admitting: Family Medicine

## 2019-11-09 DIAGNOSIS — E785 Hyperlipidemia, unspecified: Secondary | ICD-10-CM

## 2019-11-12 ENCOUNTER — Telehealth: Payer: Self-pay | Admitting: Rheumatology

## 2019-11-12 DIAGNOSIS — M0579 Rheumatoid arthritis with rheumatoid factor of multiple sites without organ or systems involvement: Secondary | ICD-10-CM

## 2019-11-12 NOTE — Telephone Encounter (Signed)
I LMOM for patient to call for a follow up appointment in June 2021.

## 2019-11-12 NOTE — Telephone Encounter (Signed)
Please schedule patient for a follow up visit. Patient due June 2021. Thanks!

## 2019-11-12 NOTE — Telephone Encounter (Signed)
Last Visit:  11/06/2019 telemedicine  Next Visit: due June 2021. Message sent to the front to schedule patient.  Labs: 06/23/2019 Eye exam: 03/28/2019   Current Dose per office note on 11/06/19:  Plaquenil 200 mg 1 tablet twice daily.  Okay to refill per Dr. Estanislado Pandy

## 2019-11-13 ENCOUNTER — Encounter (INDEPENDENT_AMBULATORY_CARE_PROVIDER_SITE_OTHER): Payer: Self-pay | Admitting: Family Medicine

## 2019-11-13 ENCOUNTER — Telehealth (INDEPENDENT_AMBULATORY_CARE_PROVIDER_SITE_OTHER): Payer: PPO | Admitting: Family Medicine

## 2019-11-13 ENCOUNTER — Other Ambulatory Visit: Payer: Self-pay

## 2019-11-13 DIAGNOSIS — Z6841 Body Mass Index (BMI) 40.0 and over, adult: Secondary | ICD-10-CM | POA: Diagnosis not present

## 2019-11-13 DIAGNOSIS — E559 Vitamin D deficiency, unspecified: Secondary | ICD-10-CM | POA: Diagnosis not present

## 2019-11-13 DIAGNOSIS — I10 Essential (primary) hypertension: Secondary | ICD-10-CM | POA: Diagnosis not present

## 2019-11-13 DIAGNOSIS — E785 Hyperlipidemia, unspecified: Secondary | ICD-10-CM

## 2019-11-13 NOTE — Progress Notes (Signed)
TeleHealth Visit:  Due to the COVID-19 pandemic, this visit was completed with telemedicine (audio/video) technology to reduce patient and provider exposure as well as to preserve personal protective equipment.   [name] has verbally consented to this TeleHealth visit. The patient is located at home, the provider is located at the [facility] office. The participants in this visit include the listed provider, patient, and the patient's husband, [name]. The visit was conducted today via FaceTime.  Chief Complaint: OBESITY [name] is here to discuss her progress with her obesity treatment plan along with follow-up of her obesity related diagnoses. [name] is keeping a food journal and adhering to recommended goals of 1200 calories and 93 grams of protein and states she is following her eating plan approximately 100% of the time. [name] states she is walking 35 minutes 2 times per week.  Today's visit was #: 18 Starting weight: 267 lbs Starting date: 10/31/2018  Interim History: [name] voices she's journaling daily and getting ~1200 calories and ~93 grams of protein. She reports having a weight of 235 today. She voices that she focuses on protein amount and doesn't pay attention to carbs.  Subjective:   Vitamin D deficiency. No nausea, vomiting, or muscle weakness. She endorses fatigue. Last Vitamin D 41.6 on 02/27/2019 (8 months ago).  Hyperlipidemia LDL goal <130. [name] is on Lipitor.   Lab Results  Component Value Date   CHOL 211 (H) 02/27/2019   HDL 57 02/27/2019   LDLCALC 144 (H) 02/27/2019   TRIG 52 02/27/2019   CHOLHDL 3.8 11/06/2017   Lab Results  Component Value Date   ALT 11 06/23/2019   AST 14 06/23/2019   ALKPHOS 77 06/23/2019   BILITOT 0.4 06/23/2019   The 10-year ASCVD risk score [name] DC Jr., et al., 2013) is: 9.4%   Values used to calculate the score:     Age: 69 years     Sex: Female     Is Non-Hispanic African American: No  Diabetic: No     Tobacco smoker: No     Systolic Blood Pressure: 123XX123 mmHg     Is BP treated: Yes     HDL Cholesterol: 57 mg/dL     Total Cholesterol: 211 mg/dL  Essential hypertension. Blood pressure today 115/81. No dizziness or lightheadedness. [name] is on lisinopril 10 mg daily.  BP Readings from Last 3 Encounters:  11/07/19 122/80  02/27/19 120/79  10/31/18 139/80   Lab Results  Component Value Date   CREATININE 0.75 06/23/2019   CREATININE 0.91 04/03/2019   CREATININE 0.73 02/27/2019   Assessment/Plan:   Vitamin D deficiency. Low Vitamin D level contributes to fatigue and are associated with obesity, breast, and colon cancer. She was given a refill on her VITAMIN D 25 Hydroxy (Vit-D Deficiency, Fractures) every week #4 with 0 refills and routine testing of Vitamin D ordered.  Hyperlipidemia LDL goal <130. Cardiovascular risk and specific lipid/LDL goals reviewed.  We discussed several lifestyle modifications today and [name] will continue to work on diet, exercise and weight loss efforts. Orders and follow up as documented in patient record. Lipid Profile ordered.  Counseling Intensive lifestyle modifications are the first line treatment for this issue. . Dietary changes: Increase soluble fiber. Decrease simple carbohydrates. . Exercise changes: Moderate to vigorous-intensity aerobic activity 150 minutes per week if tolerated. . Lipid-lowering medications: see documented in medical record.    Essential hypertension. [name] is working on healthy weight loss and exercise to improve blood  pressure control. We will watch for signs of hypotension as she continues her lifestyle modifications. [name] will decrease lisinopril to 5 mg. Will follow-up at her next appointment to see if we can stop lisinopril.  Class 3 severe obesity with serious comorbidity and body mass index (BMI) of 40.0 to 44.9 in adult, unspecified obesity type ([place]).  [name] is currently in the action stage  of change. As such, her goal is to continue with weight loss efforts. She has agreed to keeping a food journal and adhering to recommended goals of 1200 calories and 93 grams of protein daily.   Exercise goals: [name] will continue her current exercise regimen.  Behavioral modification strategies: increasing lean protein intake, increasing vegetables, planning for success and keeping a strict food journal.  [name] has agreed to follow-up with our clinic in 2 weeks. She was informed of the importance of frequent follow-up visits to maximize her success with intensive lifestyle modifications for her multiple health conditions.  [name] was informed we would discuss her lab results at her next visit unless there is a critical issue that needs to be addressed sooner. [name] agreed to keep her next visit at the agreed upon time to discuss these results.  Objective:   VITALS: Per patient if applicable, see vitals. GENERAL: Alert and in no acute distress. CARDIOPULMONARY: No increased WOB. Speaking in clear sentences.  PSYCH: Pleasant and cooperative. Speech normal rate and rhythm. Affect is appropriate. Insight and judgement are appropriate. Attention is focused, linear, and appropriate.  NEURO: Oriented as arrived to appointment on time with no prompting.   Lab Results  Component Value Date   CREATININE 0.75 06/23/2019   BUN 17 06/23/2019   NA 142 06/23/2019   K 5.2 06/23/2019   CL 106 06/23/2019   CO2 25 06/23/2019   Lab Results  Component Value Date   ALT 11 06/23/2019   AST 14 06/23/2019   ALKPHOS 77 06/23/2019   BILITOT 0.4 06/23/2019   Lab Results  Component Value Date   HGBA1C 5.4 02/27/2019   HGBA1C 5.5 10/31/2018   Lab Results  Component Value Date   INSULIN 8.3 02/27/2019   INSULIN 12.3 10/31/2018   Lab Results  Component Value Date   TSH 1.180 10/31/2018   Lab Results  Component Value Date   CHOL 211 (H) 02/27/2019   HDL 57 02/27/2019   LDLCALC 144 (H)  02/27/2019   TRIG 52 02/27/2019   CHOLHDL 3.8 11/06/2017   Lab Results  Component Value Date   WBC 4.8 06/23/2019   HGB 14.2 06/23/2019   HCT 42.5 06/23/2019   MCV 91 06/23/2019   PLT 190 06/23/2019   No results found for: IRON, TIBC, FERRITIN  Attestation Statements:   Reviewed by clinician on day of visit: allergies, medications, problem list, medical history, surgical history, family history, social history, and previous encounter notes.  I, Michaelene Song, am acting as transcriptionist for Coralie Common, MD   I have reviewed the above documentation for accuracy and completeness, and I agree with the above. - Ilene Qua, MD

## 2019-11-20 ENCOUNTER — Other Ambulatory Visit: Payer: Self-pay

## 2019-11-20 ENCOUNTER — Ambulatory Visit (INDEPENDENT_AMBULATORY_CARE_PROVIDER_SITE_OTHER): Payer: PPO | Admitting: Psychiatry

## 2019-11-20 DIAGNOSIS — F331 Major depressive disorder, recurrent, moderate: Secondary | ICD-10-CM | POA: Diagnosis not present
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
## 2019-11-28 DIAGNOSIS — Z79899 Other long term (current) drug therapy: Secondary | ICD-10-CM | POA: Diagnosis not present

## 2019-11-29 LAB — CMP14+EGFR
ALT: 11 IU/L (ref 0–32)
AST: 19 IU/L (ref 0–40)
Albumin/Globulin Ratio: 2 (ref 1.2–2.2)
Albumin: 4.3 g/dL (ref 3.8–4.8)
Alkaline Phosphatase: 70 IU/L (ref 39–117)
BUN/Creatinine Ratio: 20 (ref 12–28)
BUN: 17 mg/dL (ref 8–27)
Bilirubin Total: 0.4 mg/dL (ref 0.0–1.2)
CO2: 23 mmol/L (ref 20–29)
Calcium: 10.7 mg/dL — ABNORMAL HIGH (ref 8.7–10.3)
Chloride: 106 mmol/L (ref 96–106)
Creatinine, Ser: 0.85 mg/dL (ref 0.57–1.00)
GFR calc Af Amer: 81 mL/min/{1.73_m2} (ref >59)
GFR calc non Af Amer: 71 mL/min/{1.73_m2} (ref >59)
Globulin, Total: 2.2 g/dL (ref 1.5–4.5)
Glucose: 98 mg/dL (ref 65–99)
Potassium: 5.2 mmol/L (ref 3.5–5.2)
Sodium: 141 mmol/L (ref 134–144)
Total Protein: 6.5 g/dL (ref 6.0–8.5)

## 2019-11-29 LAB — CBC WITH DIFFERENTIAL/PLATELET
Basophils Absolute: 0 10*3/uL (ref 0.0–0.2)
Basos: 0 %
EOS (ABSOLUTE): 0.1 10*3/uL (ref 0.0–0.4)
Eos: 2 %
Hematocrit: 40.5 % (ref 34.0–46.6)
Hemoglobin: 13 g/dL (ref 11.1–15.9)
Immature Grans (Abs): 0 10*3/uL (ref 0.0–0.1)
Immature Granulocytes: 0 %
Lymphocytes Absolute: 1.7 10*3/uL (ref 0.7–3.1)
Lymphs: 35 %
MCH: 28.3 pg (ref 26.6–33.0)
MCHC: 32.1 g/dL (ref 31.5–35.7)
MCV: 88 fL (ref 79–97)
Monocytes Absolute: 0.5 10*3/uL (ref 0.1–0.9)
Monocytes: 10 %
Neutrophils Absolute: 2.5 10*3/uL (ref 1.4–7.0)
Neutrophils: 53 %
Platelets: 210 10*3/uL (ref 150–450)
RBC: 4.59 x10E6/uL (ref 3.77–5.28)
RDW: 14.2 % (ref 11.7–15.4)
WBC: 4.8 10*3/uL (ref 3.4–10.8)

## 2019-11-30 NOTE — Telephone Encounter (Signed)
CBC and CMP are normal except calcium is high. Avoid Ca and Vit D supplement.

## 2019-12-02 ENCOUNTER — Ambulatory Visit (INDEPENDENT_AMBULATORY_CARE_PROVIDER_SITE_OTHER): Payer: PPO | Admitting: Family Medicine

## 2019-12-02 ENCOUNTER — Other Ambulatory Visit: Payer: Self-pay

## 2019-12-02 ENCOUNTER — Encounter (INDEPENDENT_AMBULATORY_CARE_PROVIDER_SITE_OTHER): Payer: Self-pay | Admitting: Family Medicine

## 2019-12-02 VITALS — BP 120/64 | HR 67 | Temp 98.0°F | Ht 63.0 in | Wt 228.0 lb

## 2019-12-02 DIAGNOSIS — Z6841 Body Mass Index (BMI) 40.0 and over, adult: Secondary | ICD-10-CM

## 2019-12-02 DIAGNOSIS — E559 Vitamin D deficiency, unspecified: Secondary | ICD-10-CM | POA: Diagnosis not present

## 2019-12-02 MED ORDER — VITAMIN D (ERGOCALCIFEROL) 1.25 MG (50000 UNIT) PO CAPS: 50000.0000 [IU] | ORAL_CAPSULE | ORAL | 0 refills | Status: DC

## 2019-12-02 NOTE — Progress Notes (Signed)
Chief Complaint:   OBESITY [name] is here to discuss her progress with her obesity treatment plan along with follow-up of her obesity related diagnoses. [name] is on keeping a food journal and adhering to recommended goals of 1200 calories and 93 grams of protein and states she is following her eating plan approximately 100% of the time. [name] states she is walking and taking the stairs more.  Today's visit was #: 25 Starting weight: 267 lbs Starting date: 10/31/2018 Today's weight: 228 lbs Today's date: 12/02/2019 Total lbs lost to date: 39 lbs Total lbs lost since last in-office visit: 15 lbs  Interim History: [name] reports that she has been experiencing vertigo.  She is getting around 1200 calories and over 90 grams of protein daily.  She says she is not hungry when she gets up, but when she eats she is very hungry.  Subjective:   1. Vitamin D deficiency [name]'s Vitamin D level was 41.6 on 02/27/2019. She is currently taking prescription vitamin D 50,000 IU each week. She denies nausea, vomiting or muscle weakness.  2. Hypercalcemia Calcium has increased to 10.7 (highest since 06/23/2019).  PTH 33 (within normal limits).  Assessment/Plan:   1. Vitamin D deficiency Low Vitamin D level contributes to fatigue and are associated with obesity, breast, and colon cancer. She agrees to continue to take prescription Vitamin D @50 ,000 IU every week and will follow-up for routine testing of Vitamin D, at least 2-3 times per year to avoid over-replacement. - Vitamin D, Ergocalciferol, (DRISDOL) 1.25 MG (50000 UNIT) CAPS capsule; Take 1 capsule (50,000 Units total) by mouth every 7 (seven) days.  Dispense: 12 capsule; Refill: 0  2. Hypercalcemia She is going to follow-up with Dr. Lovena Le (Dr. Lance Sell replacement). - Ambulatory referral to Endocrinology  3. Class 3 severe obesity with serious comorbidity and body mass index (BMI) of 40.0 to 44.9 in adult, unspecified obesity type  [facility]) [name] is currently in the action stage of change. As such, her goal is to continue with weight loss efforts. She has agreed to keeping a food journal and adhering to recommended goals of 1200 calories and 93 grams of protein.   Exercise goals: As is.  Behavioral modification strategies: increasing lean protein intake, meal planning and cooking strategies, keeping healthy foods in the home and planning for success.  [name] has agreed to follow-up with our clinic in 4 weeks. She was informed of the importance of frequent follow-up visits to maximize her success with intensive lifestyle modifications for her multiple health conditions.   Objective:   Blood pressure 120/64, pulse 67, temperature 98 F (36.7 C), temperature source Oral, height 5\' 3"  (1.6 m), weight 228 lb (103.4 kg), SpO2 99 %. Body mass index is 40.39 kg/m.  General: Cooperative, alert, well developed, in no acute distress. HEENT: Conjunctivae and lids unremarkable. Cardiovascular: Regular rhythm.  Lungs: Normal work of breathing. Neurologic: No focal deficits.   Lab Results  Component Value Date   CREATININE 0.85 11/28/2019   BUN 17 11/28/2019   NA 141 11/28/2019   K 5.2 11/28/2019   CL 106 11/28/2019   CO2 23 11/28/2019   Lab Results  Component Value Date   ALT 11 11/28/2019   AST 19 11/28/2019   ALKPHOS 70 11/28/2019   BILITOT 0.4 11/28/2019   Lab Results  Component Value Date   HGBA1C 5.4 02/27/2019   HGBA1C 5.5 10/31/2018   Lab Results  Component Value Date   INSULIN 8.3 02/27/2019   INSULIN  12.3 10/31/2018   Lab Results  Component Value Date   TSH 1.180 10/31/2018   Lab Results  Component Value Date   CHOL 211 (H) 02/27/2019   HDL 57 02/27/2019   LDLCALC 144 (H) 02/27/2019   TRIG 52 02/27/2019   CHOLHDL 3.8 11/06/2017   Lab Results  Component Value Date   WBC 4.8 11/28/2019   HGB 13.0 11/28/2019   HCT 40.5 11/28/2019   MCV 88 11/28/2019   PLT 210 11/28/2019   Obesity  Behavioral Intervention Documentation for Insurance:   Approximately 15 minutes were spent on the discussion below.  ASK: We discussed the diagnosis of obesity with Curt Bears today and Kyarra agreed to give [place] permission to discuss obesity behavioral modification therapy today.  ASSESS: [name] has the diagnosis of obesity and her BMI today is 40.5. [name] is in the action stage of change.   ADVISE: [name] was educated on the multiple health risks of obesity as well as the benefit of weight loss to improve her health. She was advised of the need for long term treatment and the importance of lifestyle modifications to improve her current health and to decrease her risk of future health problems.  AGREE: Multiple dietary modification options and treatment options were discussed and [name] agreed to follow the recommendations documented in the above note.  ARRANGE: [name] was educated on the importance of frequent visits to treat obesity as outlined per CMS and USPSTF guidelines and agreed to schedule her next follow up appointment today.  Attestation Statements:   Reviewed by clinician on day of visit: allergies, medications, problem list, medical history, surgical history, family history, social history, and previous encounter notes.  I, Water quality scientist, CMA, am acting as transcriptionist for Coralie Common, MD.  I have reviewed the above documentation for accuracy and completeness, and I agree with the above. - Ilene Qua, MD

## 2019-12-03 DIAGNOSIS — H43813 Vitreous degeneration, bilateral: Secondary | ICD-10-CM | POA: Diagnosis not present

## 2019-12-04 ENCOUNTER — Ambulatory Visit (INDEPENDENT_AMBULATORY_CARE_PROVIDER_SITE_OTHER): Payer: PPO | Admitting: Psychiatry

## 2019-12-04 ENCOUNTER — Other Ambulatory Visit: Payer: Self-pay

## 2019-12-04 DIAGNOSIS — F331 Major depressive disorder, recurrent, moderate: Secondary | ICD-10-CM | POA: Diagnosis not present
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
## 2019-12-15 ENCOUNTER — Other Ambulatory Visit: Payer: Self-pay

## 2019-12-15 ENCOUNTER — Ambulatory Visit (INDEPENDENT_AMBULATORY_CARE_PROVIDER_SITE_OTHER): Payer: Self-pay | Admitting: *Deleted

## 2019-12-15 DIAGNOSIS — Z86010 Personal history of colonic polyps: Secondary | ICD-10-CM

## 2019-12-15 NOTE — Progress Notes (Signed)
Gastroenterology Pre-Procedure Review  Request Date: 12/15/2019 Requesting Physician: 5 year recall, Last TCS 10/12/2014 done by Dr. Oneida Alar, tubular adenoma (2 fragments), 3 hyperplastic polyps  PATIENT REVIEW QUESTIONS: The patient responded to the following health history questions as indicated:    1. Diabetes Melitis: no 2. Joint replacements in the past 12 months: no 3. Major health problems in the past 3 months: no 4. Has an artificial valve or MVP: no 5. Has a defibrillator: no 6. Has been advised in past to take antibiotics in advance of a procedure like teeth cleaning: no 7. Family history of colon cancer: no 8. Alcohol Use: yes, 1 glass of wine every 6 months 9. Illicit drug Use: no 10. History of sleep apnea: no 11. History of coronary artery or other vascular stents placed within the last 12 months: no 12. History of any prior anesthesia complications: no 13. There is no height or weight on file to calculate BMI. ht: 5'3 wt: 230 lbs    MEDICATIONS & ALLERGIES:    Patient reports the following regarding taking any blood thinners:   Plavix? no Aspirin? no Coumadin? no Brilinta? no Xarelto? no Eliquis? no Pradaxa? no Savaysa? no Effient? no  Patient confirms/reports the following medications:  Current Outpatient Medications  Medication Sig Dispense Refill  . acetaminophen (TYLENOL) 500 MG tablet Take 500 mg by mouth as needed (pain).     [name] atorvastatin (LIPITOR) 80 MG tablet Take 1 tablet (80 mg total) by mouth daily. 30 tablet 0  . B Complex Vitamins (VITAMIN B COMPLEX) TABS Take by mouth daily.    . calcium carbonate (TUMS - DOSED IN MG ELEMENTAL CALCIUM) 500 MG chewable tablet Chew 1 tablet by mouth as needed for indigestion or heartburn.    . chlorzoxazone (PARAFON) 500 MG tablet Take by mouth as needed for muscle spasms.     . fluorometholone (FML) 0.1 % ophthalmic suspension as needed.     . hydroxychloroquine (PLAQUENIL) 200 MG tablet TAKE 1 TABLET BY MOUTH  TWICE A DAY 180 tablet 0  . lisinopril (ZESTRIL) 10 MG tablet TAKE 1 TABLET BY MOUTH EVERY DAY 90 tablet 1  . Probiotic Product (ALIGN PO) Take by mouth daily.    [name] Propylene Glycol (SYSTANE BALANCE OP) Apply to eye.    . Vitamin D, Ergocalciferol, (DRISDOL) 1.25 MG (50000 UNIT) CAPS capsule Take 1 capsule (50,000 Units total) by mouth every 7 (seven) days. 12 capsule 0  . clobetasol cream (TEMOVATE) [id number] % Apply 1 application topically 2 (two) times daily. Prn rash up to 2 weeks (Patient not taking: Reported on 12/15/2019) 30 g 0   Current Facility-Administered Medications  Medication Dose Route Frequency Provider Last Rate Last Admin  . methylPREDNISolone acetate (DEPO-MEDROL) injection 40 mg  40 mg Intra-articular Once [name], MD      . methylPREDNISolone acetate (DEPO-MEDROL) injection 40 mg  40 mg Intra-articular Once [name], MD        Patient confirms/reports the following allergies:  Allergies  Allergen Reactions  . Codeine     REACTION: Nausea  . Cyclosporine Swelling    No orders of the defined types were placed in this encounter.   AUTHORIZATION INFORMATION Primary Insurance: [employer],  ID #: [id number] Pre-Cert / Josem Kaufmann required: No, not required  SCHEDULE INFORMATION: Procedure has been scheduled as follows:  Date: 03/05/2020, Time: 9:30 Location: [facility] with Dr. Gala Romney  This Gastroenterology Pre-Precedure Review Form is being routed to the following provider(s): Walden Field, NP

## 2019-12-15 NOTE — Progress Notes (Signed)
Ok to schedule with RMR in [facility] absence.

## 2019-12-15 NOTE — Patient Instructions (Signed)
[place]   Please notify us immediately if you are diabetic, take iron supplements, or if you are on coumadin or any blood thinners.   Patient Name: [name]  Date of procedure: 03/05/2020  Time to register at [facility]: 8:30 am Provider: Dr. Gala Romney   Purchase: MIRALAX 238 gram bottle, 1 FLEET ENEMA, 1 box of DULCOLAX (All over the counter medications)    03/03/2020- 2 Days prior to procedure: START CLEAR LIQUID DIET AFTER YOUR LUNCH MEAL--NO SOLID FOODS!   03/04/2020- 1 Day prior to procedure:   CLEAR LIQUIDS ALL DAY--NO SOLID FOODS!   Diabetic medication adjustments for today:    At 10:00 AM, take 2 DULCOLAX '5mg'$  tablets   At 12:00 PM, Mix 5 teaspoons of Miralax in any 4-6 ounces of CLEAR LIQUIDS (Gatorade) every hour for 5 hours until passing clear, watery stools. Be sure to drink 4 ounces of clear liquid 30 minutes after each dose of Miralax.   At 3:00 PM, take 2 Dulcolax '5mg'$  tablets   If stools are not clear & watery by 6:00 PM, take 5 teaspoons of Miralax every 30 minutes until stools are clear (no color)   You must have a complete prep to ensure the most effective cleaning.   CONTINUE CLEAR LIQUIDS ONLY UNTIL MIDNIGHT. Make a conscious effort to drink as much as you can before, during & after the preparation.    NOTHING TO EAT OR DRINK AFTER MIDNIGHT except for your heart, blood pressure & breathing medications. You may take them with a sip of clear liquids.    03/05/2020- Day of Procedure  Give yourself one Fleet enema about 1 hour prior to leaving for the hospital.   Diabetic medication adjustments for today:   You may take TYLENOL products. Please continue your regular medications unless we have instructed you otherwise.    Please note, on the day of your procedure you MUST be accompanied by an adult who is willing to assume responsibility for you at time of discharge. If you do not have such person with you, your  procedure will have to be rescheduled.                                                             Please leave ALL jewelry at home prior to coming to the hospital for your procedure.   *It is your responsibility to check with your insurance company for the benefits of coverage you have for this procedure. Unfortunately, not all insurance companies have benefits to cover all or part of these types of procedures. It is your responsibility to check your benefits, however we will be glad to assist you with any codes your insurance company may need.   Please note that most insurance companies will not cover a screening colonoscopy for people under the age of 21. For example, with some insurance companies you may have benefits for a screening colonoscopy, but if polyps are found the diagnosis will change and then you may have a deductible that will need to be met. Please make sure you check your benefits for screening colonoscopy as well as a diagnostic colonoscopy.    CLEAR LIQUIDS: (NO RED)  Jello Apple Juice White Grape Juice Water  Banana popsicles Kool-Aid Coffee(No cream or milk)  Tea (No cream or milk) Soft drinks Broth (fat free beef/chicken/vegetable)   Clear liquids allow you to see your fingers on the other side of the glass. Be sure they are NOT RED in color, cloudy, but CLEAR.   Do Not Eat:  Dairy products of any kind Cranberry juice  Tomato or V8 Juice Orange Juice  Grapefruit Juice Red Grape Juice  Solid foods like cereal, oatmeal, yogurt, fruits, vegetables, creamed soups, eggs, bread, etc   HELPFUL HINTS TO MAKE DRINKING EASIER:  -Make sure prep is extremely COLD. Refrigerate the night before. You may also put in freezer.  -You may try mixing Crystal Light or Country Time Lemonade if you prefer. MIx in small amounts. Add more if necessary.  -Trying drinking through a straw.  -Rinse mouth with water or mouthwash  between glasses to remove aftertaste.  -Try sipping on a cold beverage/ice popsicles between glasses of prep.  -Place a piece of sugar-free hard candy in mouth between glasses.  -If you become nauseated, try consuming smaller amounts or stretch out the time between glasses. Stop for 30 minutes to an hour & slowly start back drinking.   Call our office with any questions or concerns at [phone].   Thank You

## 2019-12-17 DIAGNOSIS — H8112 Benign paroxysmal vertigo, left ear: Secondary | ICD-10-CM | POA: Diagnosis not present

## 2019-12-17 DIAGNOSIS — T161XXA Foreign body in right ear, initial encounter: Secondary | ICD-10-CM | POA: Diagnosis not present

## 2019-12-17 DIAGNOSIS — R42 Dizziness and giddiness: Secondary | ICD-10-CM | POA: Diagnosis not present

## 2019-12-17 DIAGNOSIS — H903 Sensorineural hearing loss, bilateral: Secondary | ICD-10-CM | POA: Diagnosis not present

## 2019-12-23 ENCOUNTER — Encounter (INDEPENDENT_AMBULATORY_CARE_PROVIDER_SITE_OTHER): Payer: Self-pay

## 2019-12-23 ENCOUNTER — Ambulatory Visit (INDEPENDENT_AMBULATORY_CARE_PROVIDER_SITE_OTHER): Payer: PPO | Admitting: Psychiatry

## 2019-12-23 ENCOUNTER — Other Ambulatory Visit: Payer: Self-pay

## 2019-12-23 DIAGNOSIS — F331 Major depressive disorder, recurrent, moderate: Secondary | ICD-10-CM | POA: Diagnosis not present
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
## 2019-12-31 ENCOUNTER — Ambulatory Visit: Payer: PPO | Admitting: "Endocrinology

## 2019-12-31 ENCOUNTER — Encounter: Payer: Self-pay | Admitting: "Endocrinology

## 2019-12-31 ENCOUNTER — Other Ambulatory Visit: Payer: Self-pay

## 2019-12-31 NOTE — Progress Notes (Signed)
Endocrinology Consult Note       12/31/2019, 9:09 AM  [name] is a 69 y.o.-year-old female, referred by her  Catherine Kirschner, MD  , for evaluation for hypercalcemia   Past Medical History:  Diagnosis Date  . Allergic rhinitis   . Anxiety   . Arthritis   . Asthma   . Constipation   . Depression   . Gallbladder problem   . Hematuria 08/25/2015  . Hyperlipidemia   . Hypertension   . Obesity   . Osteoarthritis   . Pelvic pain in female 08/25/2015  . Rheumatoid arthritis ([place])   . Swallowing difficulty   . Swelling     Past Surgical History:  Procedure Laterality Date  . BACK SURGERY    . CHOLECYSTECTOMY    . COLONOSCOPY  2007   sessile sigmoid polyp x 1; path: serrated adenoma  . COLONOSCOPY N/A 10/12/2014   Procedure: COLONOSCOPY;  Surgeon: Catherine Binder, MD;  Location: [facility];  Service: Endoscopy;  Laterality: N/A;  . JOINT REPLACEMENT     BIL knee   . KNEE ARTHROPLASTY    . KNEE SURGERY     bilateral knee replacement  . LIPOMA EXCISION     x 2  . TONSILLECTOMY AND ADENOIDECTOMY      Social History   Tobacco Use  . Smoking status: Never Smoker  . Smokeless tobacco: Never Used  Substance Use Topics  . Alcohol use: Yes    Alcohol/week: 0.0 standard drinks    Comment: sometimes; rarely  . Drug use: No    Family History  Problem Relation Age of Onset  . Heart attack Father   . Alcoholism Father   . Depression Father   . Heart disease Father   . Hyperlipidemia Father   . Hypertension Father   . Leukemia Mother   . Hypertension Mother   . Cancer Mother   . Depression Mother   . Obesity Sister   . Other Sister        breathing problems  . Hepatitis C Sister   . Arthritis Sister   . Depression Sister   . Emphysema Brother   . Obesity Daughter   . Polycystic ovary syndrome Daughter   . Obesity Son   . Other Son        knee problems  . Cancer Maternal Grandmother        breast   . Other Brother        aneursym  . Arthritis Sister   . Depression Sister   . Obesity Son   . Asthma Son   . Obesity Son   . Diabetes Other        runs on dad's side of the family  . Colon cancer Neg Hx        states she doesn't know a lot about her family, but has never heard of specific colon ca    Outpatient Encounter Medications as of 12/31/2019  Medication Sig  . acetaminophen (TYLENOL) 500 MG tablet Take 500 mg by mouth as needed (pain).   [name] atorvastatin (LIPITOR) 80 MG tablet Take 1 tablet (80 mg total)  by mouth daily.  . B Complex Vitamins (VITAMIN B COMPLEX) TABS Take by mouth daily.  . chlorzoxazone (PARAFON) 500 MG tablet Take by mouth as needed for muscle spasms.   . fluorometholone (FML) 0.1 % ophthalmic suspension as needed.   . hydroxychloroquine (PLAQUENIL) 200 MG tablet TAKE 1 TABLET BY MOUTH TWICE A DAY  . lisinopril (ZESTRIL) 10 MG tablet TAKE 1 TABLET BY MOUTH EVERY DAY  . Probiotic Product (ALIGN PO) Take by mouth daily.  [name] Propylene Glycol (SYSTANE BALANCE OP) Apply to eye.  . Vitamin D, Ergocalciferol, (DRISDOL) 1.25 MG (50000 UNIT) CAPS capsule Take 1 capsule (50,000 Units total) by mouth every 7 (seven) days.  . [DISCONTINUED] calcium carbonate (TUMS - DOSED IN MG ELEMENTAL CALCIUM) 500 MG chewable tablet Chew 1 tablet by mouth as needed for indigestion or heartburn.  . [DISCONTINUED] clobetasol cream (TEMOVATE) [id number] % Apply 1 application topically 2 (two) times daily. Prn rash up to 2 weeks (Patient not taking: Reported on 12/15/2019)   Facility-Administered Encounter Medications as of 12/31/2019  Medication  . methylPREDNISolone acetate (DEPO-MEDROL) injection 40 mg  . methylPREDNISolone acetate (DEPO-MEDROL) injection 40 mg    Allergies  Allergen Reactions  . Codeine     REACTION: Nausea  . Cyclosporine Swelling     HPI  [name] was diagnosed with hypercalcemia approximately at age 9.  She reports that she never required any intervention.   Patient has no previously known history of parathyroid, pituitary, adrenal dysfunctions; no family history of such dysfunctions. -Review of herreferral package of most recent labs reveals calcium of 10.5 the corresponding PTH of 33 on April 10, 2019.  Her highest calcium has been 10.9 in July 2019. She did have a normal DEXA scan in 2009. No prior history of fragility fractures or falls. No history of  kidney stones.  No history of CKD.  she is not on HCTZ or other thiazide therapy, however she has used Tums for acid reflux for a long time and still on it.   No history of  vitamin D deficiency. Last vitamin D level was 41 in April 2021.  she  eats dairy and green, leafy, vegetables on average amounts.  she does not have a family history of hypercalcemia, pituitary tumors, thyroid cancer, or osteoporosis.   ROS:  Constitutional: + Minimally fluctuating body weight, current BMI 40.8 , no fatigue, no subjective hyperthermia, no subjective hypothermia Eyes: no blurry vision, no xerophthalmia ENT: no sore throat, no nodules palpated in throat, no dysphagia/odynophagia, no hoarseness Cardiovascular: no Chest Pain, no Shortness of Breath, no palpitations, no leg swelling Respiratory: no cough, no shortness of breath  Gastrointestinal: no Nausea/Vomiting/Diarhhea Musculoskeletal: no muscle/joint aches Skin: no rashes Neurological: no tremors, no numbness, no tingling, no dizziness Psychiatric: no depression, no anxiety  PE: BP 128/81   Pulse 62   Ht 5\' 3"  (1.6 m)   Wt 230 lb 9.6 oz (104.6 kg)   BMI 40.85 kg/m , Body mass index is 40.85 kg/m. Wt Readings from Last 3 Encounters:  12/31/19 230 lb 9.6 oz (104.6 kg)  12/02/19 228 lb (103.4 kg)  11/07/19 234 lb 3.2 oz (106.2 kg)    Constitutional:  + BMI of 40.8, not in acute distress, normal state of mind Eyes: PERRLA, EOMI, no exophthalmos ENT: moist mucous membranes, no gross thyromegaly, no gross cervical  lymphadenopathy Cardiovascular: normal precordial activity, Regular Rate and Rhythm, no Murmur/Rubs/Gallops Respiratory:  adequate breathing efforts, no gross chest deformity, Clear to auscultation bilaterally  Gastrointestinal: abdomen soft, Non -tender, No distension, Bowel Sounds present Musculoskeletal: no gross deformities, strength intact in all four extremities Skin: moist, warm, no rashes Neurological: no tremor with outstretched hands, Deep tendon reflexes normal in bilateral lower extremities.     CMP ( most recent) CMP     Component Value Date/Time   NA 141 11/28/2019 0843   K 5.2 11/28/2019 0843   CL 106 11/28/2019 0843   CO2 23 11/28/2019 0843   GLUCOSE 98 11/28/2019 0843   GLUCOSE 89 10/30/2018 1422   BUN 17 11/28/2019 0843   CREATININE 0.85 11/28/2019 0843   CREATININE 0.95 10/30/2018 1422   CALCIUM 10.7 (H) 11/28/2019 0843   PROT 6.5 11/28/2019 0843   ALBUMIN 4.3 11/28/2019 0843   AST 19 11/28/2019 0843   ALT 11 11/28/2019 0843   ALKPHOS 70 11/28/2019 0843   BILITOT 0.4 11/28/2019 0843   GFRNONAA 71 11/28/2019 0843   GFRNONAA 62 10/30/2018 1422   GFRAA 81 11/28/2019 0843   GFRAA 72 10/30/2018 1422     Diabetic Labs (most recent): Lab Results  Component Value Date   HGBA1C 5.4 02/27/2019   HGBA1C 5.5 10/31/2018     Lipid Panel ( most recent) Lipid Panel     Component Value Date/Time   CHOL 211 (H) 02/27/2019 1556   TRIG 52 02/27/2019 1556   HDL 57 02/27/2019 1556   CHOLHDL 3.8 11/06/2017 1041   CHOLHDL 6.6 07/09/2014 0819   VLDL 13 07/09/2014 0819   LDLCALC 144 (H) 02/27/2019 1556   LABVLDL 10 02/27/2019 1556      Lab Results  Component Value Date   TSH 1.180 10/31/2018   TSH 0.67 03/06/2017   TSH 1.899 03/18/2009   TSH 1.613 03/08/2007   FREET4 1.47 10/31/2018      Assessment: 1. Hypercalcemia  Plan: Patient has had chronic mild hypercalcemia.  She did have several instances of elevated calcium, with the highest level being at  10.9 mg/dL.  Another calcium measurement of 10.5 with a corresponding PTH of 33 in August 2020.     -She did have vitamin D deficiency, currently is vitamin D replete, continue on regular vitamin D supplement.    - No apparent complications from hypercalcemia/hyperparathyroidism: no history of  nephrolithiasis,  osteoporosis,fragility fractures. No abdominal pain, no major mood disorders, no bone pain.  - I discussed with the patient about the physiology of calcium and parathyroid hormone, and possible  effects of  increased PTH/ Calcium , including kidney stones, cardiac dysrhythmias, osteoporosis, abdominal pain, etc.   - The work up so far is not sufficient to reach a conclusion for definitive therapy.  she  needs more studies to confirm and classify the parathyroid dysfunction she may have. -Her mild chronic hypercalcemia could be a complication of her regular use of calcium carbonate ( TUMS)  which I have advised her to discontinue.  After 3 months off of TUMS: She will have labs for  repeat intact PTH/calcium, serum magnesium, serum phosphorus.  It is also essential to obtain 24-hour urine calcium/creatinine to rule out the rare but important cause of mild elevation in calcium and PTH- [place] ( Familial Hypocalciuric Hypercalcemia), which may not require any active intervention.  -She had normal DEXA scan in 2009, I will request for her next DEXA scan to include the distal  33% of  radius for evaluation of cortical bone, which is predominantly affected by hyperparathyroidism.   Her labs will also include thyroid function test and vitamin D  panel.   If she is confirmed to have primary hyperparathyroidism, she is a surgical candidate.  She is advised to continue close follow-up with her PMD Dr. Baltazar Apo.  - Time spent with the patient: 60 minutes, of which >50% was spent in obtaining information about her symptoms, reviewing her previous labs, evaluations, and treatments, counseling her  about her hypercalcemia, vitamin D deficiency, and developing a plan to confirm the diagnosis and long term treatment as necessary.  Please refer to " Patient Self Inventory" in the Media  tab for reviewed elements of pertinent patient history.  Josephina Shih participated in the discussions, expressed understanding, and voiced agreement with the above plans.  All questions were answered to her satisfaction. she is encouraged to contact clinic should she have any questions or concerns prior to her return visit.  - Return in about 3 months (around 04/01/2020) for DXA Scan B4 NV, 24 Hr Urine Ca & Cr, F/U with Pre-visit Labs.   Glade Lloyd, MD [facility] [address] [place], [place] [address] Phone: [phone]  Fax: [phone]    This note was partially dictated with voice recognition software. Similar sounding words can be transcribed inadequately or may not  be corrected upon review.  12/31/2019, 9:09 AM

## 2020-01-01 ENCOUNTER — Telehealth: Payer: Self-pay

## 2020-01-01 NOTE — Telephone Encounter (Signed)
Pt scheduled for bone density study at [facility] on March 24th at 9:00a.m.[name] Pt made aware of appointment also to stop any calcium supplements 24-48hrs prior to test, and understanding voiced.

## 2020-01-06 ENCOUNTER — Ambulatory Visit (INDEPENDENT_AMBULATORY_CARE_PROVIDER_SITE_OTHER): Payer: PPO | Admitting: Family Medicine

## 2020-01-06 ENCOUNTER — Other Ambulatory Visit (INDEPENDENT_AMBULATORY_CARE_PROVIDER_SITE_OTHER): Payer: Self-pay | Admitting: Family Medicine

## 2020-01-06 DIAGNOSIS — E785 Hyperlipidemia, unspecified: Secondary | ICD-10-CM

## 2020-01-07 ENCOUNTER — Encounter: Payer: Self-pay | Admitting: Nurse Practitioner

## 2020-01-07 ENCOUNTER — Other Ambulatory Visit: Payer: Self-pay

## 2020-01-07 ENCOUNTER — Ambulatory Visit (INDEPENDENT_AMBULATORY_CARE_PROVIDER_SITE_OTHER): Payer: PPO | Admitting: Nurse Practitioner

## 2020-01-07 VITALS — BP 138/88 | Temp 95.2°F | Wt 231.6 lb

## 2020-01-07 DIAGNOSIS — R21 Rash and other nonspecific skin eruption: Secondary | ICD-10-CM

## 2020-01-07 DIAGNOSIS — R42 Dizziness and giddiness: Secondary | ICD-10-CM | POA: Diagnosis not present

## 2020-01-07 MED ORDER — FAMOTIDINE 40 MG PO TABS: 40.0000 mg | ORAL_TABLET | Freq: Every day | ORAL | 0 refills | Status: DC

## 2020-01-07 MED ORDER — TRIAMCINOLONE ACETONIDE 0.1 % EX CREA
1.0000 | TOPICAL_CREAM | Freq: Two times a day (BID) | CUTANEOUS | 0 refills | Status: DC
Start: 2020-01-07 — End: 2021-12-26

## 2020-01-07 NOTE — Progress Notes (Signed)
   Subjective:    Patient ID: MILAH SIEBEL, female    DOB: [birth year], 69 y.o.   MRN: [id number]  HPI Patient presents for a followup check on her lower back rash. Patient reports there is still frequent itching that is worsened by sweat and made better by hot showers. Endorses some improvement with Cerave lotion, but denies long term benefit. Reports did not start steroid cream that was preciously ordered due to increased cost.     Review of Systems  Respiratory: Negative for chest tightness and shortness of breath.   Cardiovascular: Negative for chest pain.  Gastrointestinal: Negative for abdominal pain, diarrhea, nausea and vomiting.  Skin: Positive for rash. Negative for pallor and wound.       Patient reports the rash at her back is still itching, but denies pain.       Objective:   Physical Exam Constitutional:      General: She is not in acute distress.    Appearance: Normal appearance. She is obese. She is not ill-appearing, toxic-appearing or diaphoretic.  Skin:    General: Skin is warm and dry.     Coloration: Skin is not ashen, cyanotic, jaundiced or pale.     Findings: Rash present. No abscess, bruising, laceration, petechiae or wound. Rash is not crusting, nodular or urticarial.       Neurological:     Mental Status: She is alert and oriented to person, place, and time. Mental status is at baseline.  Psychiatric:        Attention and Perception: Attention and perception normal.        Mood and Affect: Mood and affect normal.        Speech: Speech normal.        Behavior: Behavior normal. Behavior is cooperative.        Thought Content: Thought content normal.        Cognition and Memory: Cognition and memory normal.        Judgment: Judgment normal.       Assessment & Plan:   Problem List Items Addressed This Visit    None    Visit Diagnoses    Rash and nonspecific skin eruption    -  Primary     Meds ordered this encounter  Medications  .  triamcinolone cream (KENALOG) 0.1 %    Sig: Apply 1 application topically 2 (two) times daily. Prn rash; use up to 2 weeks    Dispense:  30 g    Refill:  0    Order Specific Question:   Supervising Provider    Answer:   Sallee Lange A [[id number]]  . famotidine (PEPCID) 40 MG tablet    Sig: Take 1 tablet (40 mg total) by mouth daily. PRN    Dispense:  30 tablet    Refill:  0    Order Specific Question:   Supervising Provider    Answer:   Sallee Lange A [[id number]]    Patient to use the triamcinolone cream x2 daily for up to two weeks to help with itching. Patient to call if unable to get prescription. Discussion of keeping area covered with lotion and to take Claritin OTC during the morning as an antihistamine along with utilization of pepcid as an H2 blocker for the stomach to see if that will decrease itching and rash side effects. Patient to discuss rash with her rheumatologist at her appointment next week. If not related to her psoriasis, call back.

## 2020-01-07 NOTE — Progress Notes (Signed)
   Subjective:    Patient ID: [name], female    DOB: April 24, 1951, 69 y.o.   MRN: [id number]  Rash This is a recurrent problem. Location: lower back. The rash is characterized by itchiness. Treatments tried: moisturizing cream; prescribed cream was to expensive for patient.       Review of Systems  Skin: Positive for rash.       Objective:   Physical Exam        Assessment & Plan:

## 2020-01-08 ENCOUNTER — Ambulatory Visit (INDEPENDENT_AMBULATORY_CARE_PROVIDER_SITE_OTHER): Payer: PPO | Admitting: Psychiatry

## 2020-01-08 DIAGNOSIS — F331 Major depressive disorder, recurrent, moderate: Secondary | ICD-10-CM
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
## 2020-01-12 ENCOUNTER — Ambulatory Visit (HOSPITAL_COMMUNITY)
Admission: RE | Admit: 2020-01-12 | Discharge: 2020-01-12 | Disposition: A | Discharge status: Home / Self Care | Payer: PPO | Source: Ambulatory Visit | Attending: "Endocrinology | Admitting: "Endocrinology

## 2020-01-12 ENCOUNTER — Other Ambulatory Visit: Payer: Self-pay

## 2020-01-12 DIAGNOSIS — M85851 Other specified disorders of bone density and structure, right thigh: Secondary | ICD-10-CM | POA: Diagnosis not present

## 2020-01-12 DIAGNOSIS — M8589 Other specified disorders of bone density and structure, multiple sites: Secondary | ICD-10-CM | POA: Diagnosis not present

## 2020-01-12 DIAGNOSIS — Z780 Asymptomatic menopausal state: Secondary | ICD-10-CM | POA: Diagnosis not present

## 2020-01-13 ENCOUNTER — Encounter (INDEPENDENT_AMBULATORY_CARE_PROVIDER_SITE_OTHER): Payer: Self-pay | Admitting: Family Medicine

## 2020-01-13 ENCOUNTER — Ambulatory Visit (INDEPENDENT_AMBULATORY_CARE_PROVIDER_SITE_OTHER): Payer: PPO | Admitting: Family Medicine

## 2020-01-13 VITALS — BP 90/62 | HR 62 | Temp 98.1°F | Ht 63.0 in | Wt 230.0 lb

## 2020-01-13 DIAGNOSIS — E785 Hyperlipidemia, unspecified: Secondary | ICD-10-CM | POA: Diagnosis not present

## 2020-01-13 DIAGNOSIS — Z6841 Body Mass Index (BMI) 40.0 and over, adult: Secondary | ICD-10-CM | POA: Diagnosis not present

## 2020-01-13 MED ORDER — ATORVASTATIN CALCIUM 80 MG PO TABS: 80.0000 mg | ORAL_TABLET | Freq: Every day | ORAL | 0 refills | Status: DC

## 2020-01-13 NOTE — Progress Notes (Signed)
Chief Complaint:   OBESITY [name] is here to discuss her progress with her obesity treatment plan along with follow-up of her obesity related diagnoses. [name] is on keeping a food journal and adhering to recommended goals of 1200 calories and 93 grams of protein and states she is following her eating plan approximately 100% of the time. [name] states she is walking for 20 minutes 5 times per week.  Today's visit was #: 20 Starting weight: 267 lbs Starting date: 10/31/2018 Today's weight: 230 lbs Today's date: 01/13/2020 Total lbs lost to date: 37 lbs Total lbs lost since last in-office visit: 0  Interim History: [name] has had many doctor's appointments and is feeling very despondent over her many health issues.  She is working with a Social worker with regard to being overwhelmed by her numerous chronic medical conditions.  She has not been emotionally eating but has eaten emotionally due to the above.  She has eaten greater than 200 calories over her goal in the past week.  Subjective:   1. Hyperlipidemia LDL goal <130 [name] has hyperlipidemia and has been trying to improve her cholesterol levels with intensive lifestyle modification including a low saturated fat diet, exercise and weight loss. She denies any chest pain, claudication or myalgias.  [name] is taking atorvastatin 80 mg and 40 mg (alternating dose).    Lab Results  Component Value Date   ALT 11 11/28/2019   AST 19 11/28/2019   ALKPHOS 70 11/28/2019   BILITOT 0.4 11/28/2019   Lab Results  Component Value Date   CHOL 211 (H) 02/27/2019   HDL 57 02/27/2019   LDLCALC 144 (H) 02/27/2019   TRIG 52 02/27/2019   CHOLHDL 3.8 11/06/2017   2. Hypercalcemia Last Ca 10.7.  She saw Dr. Dorris Fetch for hypercalcemia and had a Dexa scan showing a T-score of -2.2.  Assessment/Plan:   1. Hyperlipidemia LDL goal <130 Cardiovascular risk and specific lipid/LDL goals reviewed.  We discussed several lifestyle modifications today  and Annarosa will continue to work on diet, exercise and weight loss efforts. Orders and follow up as documented in patient record.   Counseling Intensive lifestyle modifications are the first line treatment for this issue. . Dietary changes: Increase soluble fiber. Decrease simple carbohydrates. . Exercise changes: Moderate to vigorous-intensity aerobic activity 150 minutes per week if tolerated. . Lipid-lowering medications: see documented in medical record. - atorvastatin (LIPITOR) 80 MG tablet; Take 1 tablet (80 mg total) by mouth daily.  Dispense: 30 tablet; Refill: 0 - Lipid Panel With LDL/HDL Ratio  2. Hypercalcemia Follow-up with Dr. Dorris Fetch on Dexa and hyperalcemia (labs in August).  3. Class 3 severe obesity with serious comorbidity and body mass index (BMI) of 40.0 to 44.9 in adult, unspecified obesity type [facility]) [name] is currently in the action stage of change. As such, her goal is to continue with weight loss efforts. She has agreed to keeping a food journal and adhering to recommended goals of 1200 calories and 90+ grams of protein.   Exercise goals: All adults should avoid inactivity. Some physical activity is better than none, and adults who participate in any amount of physical activity gain some health benefits.  Behavioral modification strategies: increasing lean protein intake, meal planning and cooking strategies and keeping healthy foods in the home.  [name] has agreed to follow-up with our clinic in 4 weeks. She was informed of the importance of frequent follow-up visits to maximize her success with intensive lifestyle modifications for her multiple health conditions.  [name] was informed we would discuss her lab results at her next visit unless there is a critical issue that needs to be addressed sooner. [name] agreed to keep her next visit at the agreed upon time to discuss these results.  Objective:   Blood pressure 90/62, pulse 62, temperature 98.1 F (36.7 C),  temperature source Oral, height 5\' 3"  (1.6 m), weight 230 lb (104.3 kg), SpO2 99 %. Body mass index is 40.74 kg/m.  General: Cooperative, alert, well developed, in no acute distress. HEENT: Conjunctivae and lids unremarkable. Cardiovascular: Regular rhythm.  Lungs: Normal work of breathing. Neurologic: No focal deficits.   Lab Results  Component Value Date   CREATININE 0.85 11/28/2019   BUN 17 11/28/2019   NA 141 11/28/2019   K 5.2 11/28/2019   CL 106 11/28/2019   CO2 23 11/28/2019   Lab Results  Component Value Date   ALT 11 11/28/2019   AST 19 11/28/2019   ALKPHOS 70 11/28/2019   BILITOT 0.4 11/28/2019   Lab Results  Component Value Date   HGBA1C 5.4 02/27/2019   HGBA1C 5.5 10/31/2018   Lab Results  Component Value Date   INSULIN 8.3 02/27/2019   INSULIN 12.3 10/31/2018   Lab Results  Component Value Date   TSH 1.180 10/31/2018   Lab Results  Component Value Date   CHOL 211 (H) 02/27/2019   HDL 57 02/27/2019   LDLCALC 144 (H) 02/27/2019   TRIG 52 02/27/2019   CHOLHDL 3.8 11/06/2017   Lab Results  Component Value Date   WBC 4.8 11/28/2019   HGB 13.0 11/28/2019   HCT 40.5 11/28/2019   MCV 88 11/28/2019   PLT 210 11/28/2019   Obesity Behavioral Intervention Documentation for Insurance:   Approximately 15 minutes were spent on the discussion below.  ASK: We discussed the diagnosis of obesity with Curt Bears today and [name] agreed to give [place] permission to discuss obesity behavioral modification therapy today.  ASSESS: [name] has the diagnosis of obesity and her BMI today is 40.8. [name] is in the action stage of change.   ADVISE: [name] was educated on the multiple health risks of obesity as well as the benefit of weight loss to improve her health. She was advised of the need for long term treatment and the importance of lifestyle modifications to improve her current health and to decrease her risk of future health problems.  AGREE: Multiple  dietary modification options and treatment options were discussed and [name] agreed to follow the recommendations documented in the above note.  ARRANGE: [name] was educated on the importance of frequent visits to treat obesity as outlined per CMS and USPSTF guidelines and agreed to schedule her next follow up appointment today.  Attestation Statements:   Reviewed by clinician on day of visit: allergies, medications, problem list, medical history, surgical history, family history, social history, and previous encounter notes.  I, Water quality scientist, CMA, am acting as transcriptionist for Coralie Common, MD.  I have reviewed the above documentation for accuracy and completeness, and I agree with the above. - Jinny Blossom, MD

## 2020-01-20 DIAGNOSIS — E785 Hyperlipidemia, unspecified: Secondary | ICD-10-CM | POA: Diagnosis not present

## 2020-01-20 NOTE — Progress Notes (Signed)
Office Visit Note  Patient: [name]             Date of Birth: 01-26-1951           MRN: [id number]             PCP: [name], MD Referring: [name], MD Visit Date: 02/02/2020 Occupation: @GUAROCC @  Subjective:  Rheumatoid Arthritis (Not doing good)   History of Present Illness: [name] is a 69 y.o. female with history of seropositive rheumatoid arthritis.  She continues to have a lot of pain and discomfort in her joints.  She denies any joint swelling but she continues to have discomfort in her bilateral hands, bilateral wrists, bilateral feet and her right shoulder.  She has difficulty lifting her right arm.  Patient states she has been thinking a lot and would like to be on more aggressive therapy now.  She continues to have some discomfort in the thoracic and lumbar spine.  Activities of Daily Living:  Patient reports morning stiffness for 3 hours.   Patient Reports nocturnal pain.  Difficulty dressing/grooming: Reports Difficulty climbing stairs: Reports Difficulty getting out of chair: Denies Difficulty using hands for taps, buttons, cutlery, and/or writing: Reports  Review of Systems  Constitutional: Positive for fatigue. Negative for night sweats, weight gain and weight loss.  HENT: Positive for mouth dryness. Negative for mouth sores, trouble swallowing, trouble swallowing and nose dryness.   Eyes: Positive for dryness. Negative for pain, redness and visual disturbance.  Respiratory: Negative for cough, shortness of breath and difficulty breathing.   Cardiovascular: Negative for chest pain, palpitations, hypertension, irregular heartbeat and swelling in legs/feet.  Gastrointestinal: Negative for blood in stool, constipation and diarrhea.  Endocrine: Negative for excessive thirst and increased urination.  Genitourinary: Negative for difficulty urinating and vaginal dryness.  Musculoskeletal: Positive for arthralgias, gait problem, joint  pain, morning stiffness and muscle tenderness. Negative for joint swelling, myalgias, muscle weakness and myalgias.  Skin: Negative for color change, rash, hair loss, skin tightness, ulcers and sensitivity to sunlight.  Allergic/Immunologic: Negative for susceptible to infections.  Neurological: Positive for weakness. Negative for dizziness, memory loss and night sweats.  Hematological: Negative for bruising/bleeding tendency and swollen glands.  Psychiatric/Behavioral: Positive for depressed mood and sleep disturbance. The patient is nervous/anxious.     PMFS History:  Patient Active Problem List   Diagnosis Date Noted  . Vitamin D deficiency 07/29/2019  . Depression with anxiety 06/19/2019  . History of total knee replacement, bilateral 10/30/2018  . History of MRSA infection 04/04/2017  . High risk medication use 03/02/2017  . Hypercalcemia 03/02/2017  . Pelvic pain in female 08/25/2015  . Hematuria 08/25/2015  . History of adenomatous polyp of colon 10/01/2014  . Seropositive rheumatoid arthritis of multiple sites ([place]) 07/20/2014  . History of juvenile rheumatoid arthritis 07/20/2014  . Impaired fasting glucose 07/20/2014  . NEOPLASM, SKIN, UNCERTAIN BEHAVIOR 0000000  . Unilateral primary osteoarthritis, right knee 09/23/2008  . CALF PAIN, RIGHT 09/23/2008  . ANXIETY DEPRESSION 08/07/2008  . OBESITY 12/19/2007  . DDD (degenerative disc disease), lumbar 06/27/2007  . Osteopenia 06/27/2007  . HIP PAIN, LEFT 06/18/2007  . CONSTIPATION 05/16/2007  . Essential hypertension 04/04/2007  . Psoriasis 03/07/2007  . OSTEOARTHROSIS, GENERALIZED, MULTIPLE SITES 03/07/2007  . ANOSMIA 03/07/2007  . LIPOMA 02/21/2007  . Hyperlipidemia LDL goal <130 02/21/2007  . ALLERGIC RHINITIS 02/21/2007  . ASTHMA 02/21/2007  . ARTHRITIS 02/21/2007  . KNEE PAIN, LEFT 02/21/2007  .  LOW BACK PAIN, CHRONIC 02/21/2007    Past Medical History:  Diagnosis Date  . Allergic rhinitis   . Anxiety   .  Arthritis   . Asthma   . Constipation   . Depression   . Gallbladder problem   . Hematuria 08/25/2015  . Hyperlipidemia   . Hypertension   . Obesity   . Osteoarthritis   . Pelvic pain in female 08/25/2015  . Rheumatoid arthritis ([place])   . Swallowing difficulty   . Swelling     Family History  Problem Relation Age of Onset  . Heart attack Father   . Alcoholism Father   . Depression Father   . Heart disease Father   . Hyperlipidemia Father   . Hypertension Father   . Leukemia Mother   . Hypertension Mother   . Cancer Mother   . Depression Mother   . Obesity Sister   . Other Sister        breathing problems  . Hepatitis C Sister   . Arthritis Sister   . Depression Sister   . Emphysema Brother   . Obesity Daughter   . Polycystic ovary syndrome Daughter   . Obesity Son   . Other Son        knee problems  . Cancer Maternal Grandmother        breast  . Other Brother        aneursym  . Arthritis Sister   . Depression Sister   . Obesity Son   . Asthma Son   . Obesity Son   . Diabetes Other        runs on dad's side of the family  . Colon cancer Neg Hx        states she doesn't know a lot about her family, but has never heard of specific colon ca   Past Surgical History:  Procedure Laterality Date  . BACK SURGERY    . CHOLECYSTECTOMY    . COLONOSCOPY  2007   sessile sigmoid polyp x 1; path: serrated adenoma  . COLONOSCOPY N/A 10/12/2014   Procedure: COLONOSCOPY;  Surgeon: [name], MD;  Location: [facility];  Service: Endoscopy;  Laterality: N/A;  . JOINT REPLACEMENT     BIL knee   . KNEE ARTHROPLASTY    . KNEE SURGERY     bilateral knee replacement  . LIPOMA EXCISION     x 2  . TONSILLECTOMY AND ADENOIDECTOMY     Social History   Social History Narrative  . Not on file   Immunization History  Administered Date(s) Administered  . Influenza Split 06/17/2013  . Influenza Whole 06/27/2007, 05/08/2008  . Influenza, High Dose Seasonal PF 06/14/2017,  06/18/2018  . Influenza,inj,Quad PF,6+ Mos 07/12/2015  . Influenza-Unspecified 06/14/2017, 06/18/2018, 05/11/2019  . PFIZER SARS-COV-2 Vaccination 10/12/2019, 11/04/2019  . Pneumococcal Polysaccharide-23 02/18/1993, 06/27/2007  . Zoster Recombinat (Shingrix) 08/19/2018     Objective: Vital Signs: BP 111/73 (BP Location: Left Arm, Patient Position: Sitting, Cuff Size: Normal)   Pulse 73   Resp 16   Ht 5\' 3"  (1.6 m)   Wt 229 lb (103.9 kg)   BMI 40.57 kg/m    Physical Exam Vitals and nursing note reviewed.  Constitutional:      Appearance: She is well-developed.  HENT:     Head: Normocephalic and atraumatic.  Eyes:     Conjunctiva/sclera: Conjunctivae normal.  Cardiovascular:     Rate and Rhythm: Normal rate and regular rhythm.  Heart sounds: Normal heart sounds.  Pulmonary:     Effort: Pulmonary effort is normal.     Breath sounds: Normal breath sounds.  Abdominal:     General: Bowel sounds are normal.     Palpations: Abdomen is soft.  Musculoskeletal:     Cervical back: Normal range of motion.  Lymphadenopathy:     Cervical: No cervical adenopathy.  Skin:    General: Skin is warm and dry.     Capillary Refill: Capillary refill takes less than 2 seconds.  Neurological:     Mental Status: She is alert and oriented to person, place, and time.  Psychiatric:        Behavior: Behavior normal.      Musculoskeletal Exam: C-spine had good range of motion.  Shoulder joint had good range of motion with discomfort with range of motion of right shoulder joint.  She had bilateral ulnar deviation with thickening of bilateral MCPs and PIPs.  She had tenderness over wrist joints.  She had good range of motion of her hip joints.  She had bilateral knee joint replacement.  She had tenderness across MTPs and PIPs and overcrowding of her toes.  CDAI Exam: CDAI Score: 9  Patient Global: 5 mm; Provider Global: 5 mm Swollen: 0 ; Tender: 18  Joint Exam 02/02/2020      Right  Left    Wrist   Tender   Tender  MCP 2   Tender   Tender  MCP 3   Tender   Tender  MCP 4   Tender   Tender  Thoracic Spine   Tender     MTP 1   Tender   Tender  MTP 2   Tender   Tender  MTP 3   Tender   Tender  MTP 4   Tender   Tender  MTP 5      Tender     Investigation: No additional findings.  Imaging: DG Bone Density  Result Date: 01/12/2020 EXAM: DUAL X-RAY ABSORPTIOMETRY (DXA) FOR BONE MINERAL DENSITY IMPRESSION: Your patient Zikeria Glaub completed a BMD test on 01/12/2020 using the [place] (software version: 14.10) manufactured by [employer]. The following summarizes the results of our evaluation. Technologist: [facility] PATIENT BIOGRAPHICAL: Name: Niccole, Mancil Patient ID: [id number] Birth Date: Jul 20, 1951 Height: 63.0 in. Gender: Female Exam Date: 01/12/2020 Weight: 231.6 lbs. Indications: Back surgery, Caucasian, Follow up Osteopenia, Height Loss, Low Calcium Intake, Post Menopausal, Rheumatoid Arthritis, Vitamin D Deficiency, Secondary Osteoporosis Fractures: Treatments: Vitamin D DENSITOMETRY RESULTS: Site         Region     Measured Date Measured Age WHO Classification Young Adult T-score BMD         %Change vs. Previous Significant Change (*) DualFemur Neck Right 01/12/2020 68.6 Osteopenia -1.2 0.871 g/cm2 -15.3% Yes DualFemur Neck Right 12/24/2007 56.6 Normal -0.1 1.028 g/cm2 - - DualFemur Total Mean 01/12/2020 68.6 Normal -0.8 0.903 g/cm2 -12.0% Yes DualFemur Total Mean 12/24/2007 56.6 Normal 0.1 1.026 g/cm2 - - Left Forearm Radius 33% 01/12/2020 68.6 Osteopenia -2.2 0.555 g/cm2 -21.4% Yes Left Forearm Radius 33% 12/24/2007 56.6 Normal -0.1 0.706 g/cm2 - - ASSESSMENT: The BMD measured at Forearm Radius 33% is 0.555 g/cm2 with a T-score of -2.2. This patient is considered osteopenic according to [facility]) criteria. The scan quality is good. Compared with the prior study on 12/24/2007, the BMD of the total mean and lt. forearm show a  statistically significant decrease. Lumbar spine  was excluded due to advanced degenerative changes. Pt. has hypercalcemia and is being worked up for hyperparathyroidism. World Pharmacologist [facility]) criteria for post-menopausal, Caucasian Women: Normal:       T-score at or above -1 SD Osteopenia:   T-score between -1 and -2.5 SD Osteoporosis: T-score at or below -2.5 SD RECOMMENDATIONS: 1. All patients should optimize calcium and vitamin D intake. 2. Consider FDA-approved medical therapies in postmenopausal women and med aged 33 years and older, based on the following: a. A hip or vertebral (clinical or morphometric) fracture b. T-score< -2.5 at the femoral neck or spine after appropriate evaluation to exclude secondary causes c. Low bone mass (T-score between -1.0 and -2.5 at the femoral neck or spine) and a 10-year probability of a hip fracture > 3% or a 10-year probability of a major osteoporosis-related fracture > 20% based on the [place]-adapted WHO algorithm d. Clinician judgment and/or patient preferences may indicate treatment for people with 10-year fracture probabilities above or below these levels FOLLOW-UP: People with diagnosed cases of osteoporosis or at high risk for fracture should have regular bone mineral density tests. For patients eligible for Medicare, routine testing is allowed once every 2 years. The testing frequency can be increased to one year for patients who have rapidly progressing disease, those who are receiving or discontinuing medical therapy to restore bone mass, or have additional risk factors. I have reviewed this report, and agree with the above findings. [facility] Your patient CARRIANN JERZAK completed a FRAX assessment on 01/12/2020 using the [place] (analysis version: 14.10) manufactured by [employer]. The following summarizes the results of our evaluation. PATIENT BIOGRAPHICAL: Name: Mistelle, Boffa Patient ID: [id number] Birth Date:  [birth year] Height:    63.0 in. Gender:     [age]    [age]       Weight:    231.6 lbs. Ethnicity:  White                            Exam Date: 01/12/2020 FRAX* RESULTS:  (version: 3.5) 10-year Probability of Fracture1 Major Osteoporotic Fracture2 Hip Fracture 10.2% 1.1% Population: [place] (Caucasian) Risk Factors: Rheumatoid Arthritis, Secondary Osteoporosis Based on Femur (Right) Neck BMD 1 -The 10-year probability of fracture may be lower than reported if the patient has received treatment. 2 -Major Osteoporotic Fracture: Clinical Spine, Forearm, Hip or Shoulder *FRAX is a Materials engineer of the [employer] for Metabolic Bone Disease, a [place] (WHO) [facility]. ASSESSMENT: The probability of a major osteoporotic fracture is 10.2% within the next ten years. The probability of a hip fracture is 1.1% within the next ten years. Electronically Signed   By: Rolm Baptise M.D.   On: 01/12/2020 09:18    Recent Labs: Lab Results  Component Value Date   WBC 4.8 11/28/2019   HGB 13.0 11/28/2019   PLT 210 11/28/2019   NA 141 11/28/2019   K 5.2 11/28/2019   CL 106 11/28/2019   CO2 23 11/28/2019   GLUCOSE 98 11/28/2019   BUN 17 11/28/2019   CREATININE 0.85 11/28/2019   BILITOT 0.4 11/28/2019   ALKPHOS 70 11/28/2019   AST 19 11/28/2019   ALT 11 11/28/2019   PROT 6.5 11/28/2019   ALBUMIN 4.3 11/28/2019   CALCIUM 10.7 (H) 11/28/2019   GFRAA 81 11/28/2019    Speciality Comments: PLQ Eye Exam: 12/03/2019  WNL @ [facility] Follow up in 1 year  Procedures:  No procedures performed Allergies: Codeine and Cyclosporine   Assessment / Plan:     Visit Diagnoses: Seropositive rheumatoid arthritis of multiple sites ([place]) - positive RF, positive anti-CCP. Previous patient of Dr. Charlestine Night with multiple contractures, ultrasound of both hands on 10/30/18 revealed synovitis.  Patient continues to have ongoing pain and discomfort in multiple joints.   No obvious synovitis was noted on clinical examination.  She has been on Rasuvo and Plaquenil combination.  She states her joints have been getting worse over the last year.  We had discussed use of Biologics at each visit.  She had detailed discussion regarding the use of biologic agents.  She states she is ready to start on a biologic agent.  Indication side effects contraindications of Enbrel were discussed at length.  Patient wants to proceed with the medication.  Handout was given and consent was taken.  We will apply for Enbrel.  She will need updated TB Gold.  When she starts Enbrel she can discontinue Plaquenil.  High risk medication use - She has been off of Rasuvo 20 mg since September 2019.  She is taking PLQ 200 mg 1 tablet by mouth twice daily. PLQ Eye Exam: 12/03/2019.  Her labs are up-to-date and within normal limits.  She will have repeat labs in July and every 3 months to monitor for drug toxicity.  History of total knee replacement, bilateral-she continues to have some discomfort.  DDD (degenerative disc disease), lumbar - s/p fusion.  She has limited range of motion.  Osteopenia of multiple sites-patient states that she has been followed by an endocrinologist.  Trochanteric bursitis of right hip -she is off-and-on discomfort.  History of MRSA infection  History of depression-she is currently not on a medication but has been followed by a counselor.  History of anxiety  History of asthma  Essential hypertension  History of adenomatous polyp of colon  History of hyperlipidemia  Orders: Orders Placed This Encounter  Procedures  . QuantiFERON-TB Gold Plus  . CBC with Differential/Platelet  . COMPLETE METABOLIC PANEL WITH GFR   No orders of the defined types were placed in this encounter.   Face-to-face time spent with patient was 30 minutes. Greater than 50% of time was spent in counseling and coordination of care.  Follow-Up Instructions: Return in about 6 weeks  (around 03/15/2020) for Rheumatoid arthritis.   [name], MD  Note - This record has been created using Editor, commissioning.  Chart creation errors have been sought, but may not always  have been located. Such creation errors do not reflect on  the standard of medical care.

## 2020-01-21 LAB — LIPID PANEL WITH LDL/HDL RATIO
Cholesterol, Total: 207 mg/dL — ABNORMAL HIGH (ref 100–199)
HDL: 57 mg/dL (ref >39)
LDL Chol Calc (NIH): 142 mg/dL — ABNORMAL HIGH (ref 0–99)
LDL/HDL Ratio: 2.5 ratio (ref 0.0–3.2)
Triglycerides: 47 mg/dL (ref 0–149)
VLDL Cholesterol Cal: 8 mg/dL (ref 5–40)

## 2020-01-22 ENCOUNTER — Other Ambulatory Visit: Payer: Self-pay

## 2020-01-22 ENCOUNTER — Ambulatory Visit (HOSPITAL_COMMUNITY): Payer: PPO | Admitting: Psychiatry

## 2020-02-01 ENCOUNTER — Other Ambulatory Visit: Payer: Self-pay | Admitting: Nurse Practitioner

## 2020-02-02 ENCOUNTER — Telehealth: Payer: Self-pay | Admitting: Pharmacist

## 2020-02-02 ENCOUNTER — Ambulatory Visit: Payer: PPO | Admitting: Rheumatology

## 2020-02-02 ENCOUNTER — Other Ambulatory Visit: Payer: Self-pay

## 2020-02-02 ENCOUNTER — Encounter: Payer: Self-pay | Admitting: Rheumatology

## 2020-02-02 VITALS — BP 111/73 | HR 73 | Resp 16 | Ht 63.0 in | Wt 229.0 lb

## 2020-02-02 DIAGNOSIS — Z8614 Personal history of Methicillin resistant Staphylococcus aureus infection: Secondary | ICD-10-CM | POA: Diagnosis not present

## 2020-02-02 DIAGNOSIS — Z96653 Presence of artificial knee joint, bilateral: Secondary | ICD-10-CM | POA: Diagnosis not present

## 2020-02-02 DIAGNOSIS — M5136 Other intervertebral disc degeneration, lumbar region: Secondary | ICD-10-CM | POA: Diagnosis not present

## 2020-02-02 DIAGNOSIS — Z8709 Personal history of other diseases of the respiratory system: Secondary | ICD-10-CM | POA: Diagnosis not present

## 2020-02-02 DIAGNOSIS — M7061 Trochanteric bursitis, right hip: Secondary | ICD-10-CM | POA: Diagnosis not present

## 2020-02-02 DIAGNOSIS — M0579 Rheumatoid arthritis with rheumatoid factor of multiple sites without organ or systems involvement: Secondary | ICD-10-CM

## 2020-02-02 DIAGNOSIS — I10 Essential (primary) hypertension: Secondary | ICD-10-CM | POA: Diagnosis not present

## 2020-02-02 DIAGNOSIS — Z79899 Other long term (current) drug therapy: Secondary | ICD-10-CM

## 2020-02-02 DIAGNOSIS — M8589 Other specified disorders of bone density and structure, multiple sites: Secondary | ICD-10-CM | POA: Diagnosis not present

## 2020-02-02 DIAGNOSIS — Z8659 Personal history of other mental and behavioral disorders: Secondary | ICD-10-CM | POA: Diagnosis not present

## 2020-02-02 DIAGNOSIS — Z8639 Personal history of other endocrine, nutritional and metabolic disease: Secondary | ICD-10-CM | POA: Diagnosis not present

## 2020-02-02 DIAGNOSIS — Z86010 Personal history of colonic polyps: Secondary | ICD-10-CM

## 2020-02-02 NOTE — Telephone Encounter (Signed)
Submitted a Prior Authorization request to [facility] for ENBREL via Cover My Meds. Will update once we receive a response.   (Key: [id number]) - [id number]

## 2020-02-02 NOTE — Patient Instructions (Addendum)
Etanercept injection What is this medicine? ETANERCEPT (et a [employer]) is used for the treatment of rheumatoid arthritis in adults and children. The medicine is also used to treat psoriatic arthritis, ankylosing spondylitis, and psoriasis. This medicine may be used for other purposes; ask your health care provider or pharmacist if you have questions. COMMON BRAND NAME(S): Enbrel What should I tell my health care provider before I take this medicine? They need to know if you have any of these conditions:  blood disorders  cancer  congestive heart failure  diabetes  exposure to chickenpox  immune system problems  infection  multiple sclerosis  seizure disorder  tuberculosis, a positive skin test for tuberculosis or have recently been in close contact with someone who has tuberculosis  Wegener's granulomatosis  an unusual or allergic reaction to etanercept, latex, other medicines, foods, dyes, or preservatives  pregnant or trying to get pregnant  breast-feeding How should I use this medicine? The medicine is given by injection under the skin. You will be taught how to prepare and give this medicine. Use exactly as directed. Take your medicine at regular intervals. Do not take your medicine more often than directed. It is important that you put your used needles and syringes in a special sharps container. Do not put them in a trash can. If you do not have a sharps container, call your pharmacist or healthcare provider to get one. A special MedGuide will be given to you by the pharmacist with each prescription and refill. Be sure to read this information carefully each time. Talk to your pediatrician regarding the use of this medicine in children. While this drug may be prescribed for children as young as 69 years of age for selected conditions, precautions do apply. Overdosage: If you think you have taken too much of this medicine contact a poison control center or emergency room  at once. NOTE: This medicine is only for you. Do not share this medicine with others. What if I miss a dose? If you miss a dose, contact your health care professional to find out when you should take your next dose. Do not take double or extra doses without advice. What may interact with this medicine? Do not take this medicine with any of the following medications:  anakinra This medicine may also interact with the following medications:  cyclophosphamide  sulfasalazine  vaccines This list may not describe all possible interactions. Give your health care provider a list of all the medicines, herbs, non-prescription drugs, or dietary supplements you use. Also tell them if you smoke, drink alcohol, or use illegal drugs. Some items may interact with your medicine. What should I watch for while using this medicine? Tell your doctor or healthcare professional if your symptoms do not start to get better or if they get worse. You will be tested for tuberculosis (TB) before you start this medicine. If your doctor prescribes any medicine for TB, you should start taking the TB medicine before starting this medicine. Make sure to finish the full course of TB medicine. Call your doctor or health care professional for advice if you get a fever, chills or sore throat, or other symptoms of a cold or flu. Do not treat yourself. This drug decreases your body's ability to fight infections. Try to avoid being around people who are sick. What side effects may I notice from receiving this medicine? Side effects that you should report to your doctor or health care professional as soon as possible:  allergic  reactions like skin rash, itching or hives, swelling of the face, lips, or tongue  changes in vision  fever, chills or any other sign of infection  numbness or tingling in legs or other parts of the body  red, scaly patches or raised bumps on the skin  shortness of breath or difficulty  breathing  swollen lymph nodes in the neck, underarm, or groin areas  unexplained weight loss  unusual bleeding or bruising  unusual swelling or fluid retention in the legs  unusually weak or tired Side effects that usually do not require medical attention (report to your doctor or health care professional if they continue or are bothersome):  dizziness  headache  nausea  redness, itching, or swelling at the injection site  vomiting This list may not describe all possible side effects. Call your doctor for medical advice about side effects. You may report side effects to FDA at 1-800-FDA-1088. Where should I keep my medicine? Keep out of the reach of children. Enbrel products: Store unopened Enbrel vials, cartridges, or pens in a refrigerator between 2 and 8 degrees C (36 and 46 degrees F). Do not freeze. Do not shake. Keep in the original container to protect from light. Throw away any unopened and unused medicine that has been stored in the refrigerator after the expiration date. If needed, you may store an Enbrel single-use vial, single-dose prefilled syringe, SureClick autoinjector, or Enbrel Mini cartridge at room temperature between 20 to 25 degrees C (68 to 77 degrees F) for up to 14 days. Protect from light, heat, and moisture. Do not shake. Once any of these items are stored at room temperature, do not place them back into the refrigerator. Throw the item away after 14 days, even if it still contains medicine. If you are using the Enbrel multi-dose vials, you will be instructed on how to dilute and store this medicine once it has been diluted. The Enbrel AutoTouch reusable autoinjector device should be stored at room temperature. Do NOT refrigerate the AutoTouch reusable autoinjector. Erelzi products: Store Actuary pen in the refrigerator between 2 and 8 degrees C (36 and 46 degrees F). Do not freeze. Do not shake. Keep in the original container  to protect from light. Throw away any unopened and unused medicine that has been stored in the refrigerator after the expiration date. If needed, you may store the [facility] prefilled syringe or pen at room temperature between 20 to 25 degrees C (68 to 77 degrees F) for up to 28 days. Protect from light, heat, and moisture. Do not shake. Once any of these items are stored at room temperature, do not place them back into the refrigerator. Throw the item away after 28 days, even if it still contains medicine. NOTE: This sheet is a summary. It may not cover all possible information. If you have questions about this medicine, talk to your doctor, pharmacist, or health care provider.  2020 Elsevier/Gold Standard (2018-11-04 09:49:58)

## 2020-02-02 NOTE — Telephone Encounter (Signed)
Please start benefits investigation for Enbrel for the treatment of premature arthritis.  Prior therapy includes chronic prednisone use, oral and injectable methotrexate, Plaquenil, and Orencia.  Mariella Saa, PharmD, [place], CPP Clinical Specialty Pharmacist (Rheumatology and Pulmonology)  02/02/2020 10:49 AM

## 2020-02-02 NOTE — Progress Notes (Signed)
Pharmacy Note  Subjective: Patient presents today to the [facility] for follow up office visit.  Patient seen by the pharmacist for counseling on Enbrel for rheumatoid arthritis.  Prior therapy includes chronic prednisone use, methotrexate, orencia and currently on Plaquenil.  Objective:  CBC    Component Value Date/Time   WBC 4.8 11/28/2019 0843   WBC 6.0 10/30/2018 1422   RBC 4.59 11/28/2019 0843   RBC 4.65 10/30/2018 1422   HGB 13.0 11/28/2019 0843   HCT 40.5 11/28/2019 0843   PLT 210 11/28/2019 0843   MCV 88 11/28/2019 0843   MCH 28.3 11/28/2019 0843   MCH 27.3 10/30/2018 1422   MCHC 32.1 11/28/2019 0843   MCHC 32.6 10/30/2018 1422   RDW 14.2 11/28/2019 0843   LYMPHSABS 1.7 11/28/2019 0843   MONOABS 924 03/06/2017 0923   EOSABS 0.1 11/28/2019 0843   BASOSABS 0.0 11/28/2019 0843     CMP     Component Value Date/Time   NA 141 11/28/2019 0843   K 5.2 11/28/2019 0843   CL 106 11/28/2019 0843   CO2 23 11/28/2019 0843   GLUCOSE 98 11/28/2019 0843   GLUCOSE 89 10/30/2018 1422   BUN 17 11/28/2019 0843   CREATININE 0.85 11/28/2019 0843   CREATININE 0.95 10/30/2018 1422   CALCIUM 10.7 (H) 11/28/2019 0843   PROT 6.5 11/28/2019 0843   ALBUMIN 4.3 11/28/2019 0843   AST 19 11/28/2019 0843   ALT 11 11/28/2019 0843   ALKPHOS 70 11/28/2019 0843   BILITOT 0.4 11/28/2019 0843   GFRNONAA 71 11/28/2019 0843   GFRNONAA 62 10/30/2018 1422   GFRAA 81 11/28/2019 0843   GFRAA 72 10/30/2018 1422     Baseline Immunosuppressant Therapy Labs TB GOLD Pending 02/02/2020  Hepatitis Panel Hepatitis Latest Ref Rng & Units 03/06/2017  Hep B Surface Ag NEGATIVE NEGATIVE  Hep B IgM NON REACTIVE NON REACTIVE  Hep C Ab NEGATIVE NEGATIVE   HIV Lab Results  Component Value Date   HIV NONREACTIVE 03/06/2017   Immunoglobulins Immunoglobulin Electrophoresis Latest Ref Rng & Units 03/06/2017  IgG 694 - 1,618 mg/dL 876  IgM 48 - 271 mg/dL 73   SPEP Serum Protein  Electrophoresis Latest Ref Rng & Units 11/28/2019  Total Protein 6.0 - 8.5 g/dL 6.5  Albumin 3.8 - 4.8 g/dL -  Alpha-1 0.2 - 0.3 g/dL -  Alpha-2 0.5 - 0.9 g/dL -  Beta Globulin 0.4 - 0.6 g/dL -  Beta 2 0.2 - 0.5 g/dL -  Gamma Globulin 0.8 - 1.7 g/dL -   G6PD No results found for: G6PDH TPMT No results found for: TPMT   Chest x-ray: no active disease  Does patient have diagnosis of heart failure?  No  Assessment/Plan:  Counseled patient that Enbrel is a TNF blocking agent. Counseled patient on purpose, proper use, and adverse effects of Enbrel.  Reviewed the most common adverse effects including infections, headache, and injection site reactions. Discussed that there is the possibility of an increased risk of malignancy but it is not well understood if this increased risk is due to the medication or the disease state.  Advised patient to get yearly dermatology exams due to risk of skin cancer.  Counseled patient that Enbrel should be held prior to scheduled surgery.  Counseled patient to avoid live vaccines while on Enbrel. Recommend annual influenza, Pneumovax 23, Prevnar 13, and Shingrix as indicated.   Reviewed the importance of regular labs while on Enbrel therapy.  Advised patient to get standing  labs one month after starting Enbrel then every 2 months.  Provided patient with standing lab orders.  Provided patient with medication education material and answered all questions.  Patient voiced understanding.  Patient consented to Enbrel.  Will upload consent into the media tab.  Reviewed storage instructions for Enbrel.  Advised initial injection must be administered in office.  Patient voiced understanding.    Patient dose will be Enbrel 50 mg every 7 days.  Prescription pending labs and/or insurance.  She has [employer] and will need to apply for patient assistance.  Patient given patient assistance application in office which she will return via mail along with income  documents.  All questions encouraged and answered.  Instructed patient to call with any further questions or concerns.   Mariella Saa, PharmD, [place], CPP Clinical Specialty Pharmacist (Rheumatology and Pulmonology)  02/02/2020 10:36 AM

## 2020-02-02 NOTE — Telephone Encounter (Signed)
Received notification from [facility] regarding a prior authorization for ENBREL. Authorization has been APPROVED from 02/02/20 to 02/01/21.   Authorization # [id number]  Ran test claim, 1 month supply is $1,759.65. Patient will need to apply for [employer] Pap.

## 2020-02-03 NOTE — Telephone Encounter (Signed)
Patient receive PAP application in office.   Called patient, left message about approval and will need to move forward with PAP application. Advised her to call with any questions.

## 2020-02-03 NOTE — Progress Notes (Signed)
CBC showed low MCHC and elevated RDW. CMP showed slightly elevated potassium and elevated calcium which is chronic.  TB gold still pending.  Patient new start Enbrel pending.

## 2020-02-03 NOTE — Progress Notes (Signed)
Please forward labs to her PCP for elevated potassium.

## 2020-02-04 LAB — CBC WITH DIFFERENTIAL/PLATELET
Absolute Monocytes: 419 cells/uL (ref 200–950)
Basophils Absolute: 32 cells/uL (ref 0–200)
Basophils Relative: 0.7 %
Eosinophils Absolute: 90 cells/uL (ref 15–500)
Eosinophils Relative: 2 %
HCT: 45 % (ref 35.0–45.0)
Hemoglobin: 14.3 g/dL (ref 11.7–15.5)
Lymphs Abs: 1350 cells/uL (ref 850–3900)
MCH: 28.8 pg (ref 27.0–33.0)
MCHC: 31.8 g/dL — ABNORMAL LOW (ref 32.0–36.0)
MCV: 90.7 fL (ref 80.0–100.0)
MPV: 10.3 fL (ref 7.5–12.5)
Monocytes Relative: 9.3 %
Neutro Abs: 2610 cells/uL (ref 1500–7800)
Neutrophils Relative %: 58 %
Platelets: 196 10*3/uL (ref 140–400)
RBC: 4.96 10*6/uL (ref 3.80–5.10)
RDW: 15.9 % — ABNORMAL HIGH (ref 11.0–15.0)
Total Lymphocyte: 30 %
WBC: 4.5 10*3/uL (ref 3.8–10.8)

## 2020-02-04 LAB — COMPLETE METABOLIC PANEL WITH GFR
AG Ratio: 1.7 (calc) (ref 1.0–2.5)
ALT: 18 U/L (ref 6–29)
AST: 20 U/L (ref 10–35)
Albumin: 4.2 g/dL (ref 3.6–5.1)
Alkaline phosphatase (APISO): 63 U/L (ref 37–153)
BUN: 21 mg/dL (ref 7–25)
CO2: 27 mmol/L (ref 20–32)
Calcium: 10.6 mg/dL — ABNORMAL HIGH (ref 8.6–10.4)
Chloride: 108 mmol/L (ref 98–110)
Creat: 0.95 mg/dL (ref 0.50–0.99)
GFR, Est African American: 71 mL/min/{1.73_m2} (ref >=60)
GFR, Est Non African American: 62 mL/min/{1.73_m2} (ref >=60)
Globulin: 2.5 g/dL (calc) (ref 1.9–3.7)
Glucose, Bld: 91 mg/dL (ref 65–99)
Potassium: 5.4 mmol/L — ABNORMAL HIGH (ref 3.5–5.3)
Sodium: 142 mmol/L (ref 135–146)
Total Bilirubin: 0.6 mg/dL (ref 0.2–1.2)
Total Protein: 6.7 g/dL (ref 6.1–8.1)

## 2020-02-04 LAB — QUANTIFERON-TB GOLD PLUS
Mitogen-NIL: 5.55 IU/mL
NIL: 0.05 IU/mL
QuantiFERON-TB Gold Plus: NEGATIVE
TB1-NIL: 0.01 IU/mL
TB2-NIL: 0 IU/mL

## 2020-02-06 ENCOUNTER — Other Ambulatory Visit: Payer: Self-pay | Admitting: Rheumatology

## 2020-02-06 DIAGNOSIS — M0579 Rheumatoid arthritis with rheumatoid factor of multiple sites without organ or systems involvement: Secondary | ICD-10-CM

## 2020-02-06 NOTE — Telephone Encounter (Signed)
Last Visit: 02/02/2020 Next Visit: due July 2021. Message sent to the front to schedule patient. Labs: 02/02/2020 CBC showed low MCHC and elevated RDW. CMP showed slightly elevated potassium and elevated calcium which is chronic. PLQ Eye Exam: 12/03/2019 WNL  Current Dose per office note on 02/02/2020: PLQ 200 mg 1 tablet by mouth twice daily  Okay to refill PLQ?

## 2020-02-09 ENCOUNTER — Other Ambulatory Visit (INDEPENDENT_AMBULATORY_CARE_PROVIDER_SITE_OTHER): Payer: Self-pay | Admitting: Family Medicine

## 2020-02-09 DIAGNOSIS — E785 Hyperlipidemia, unspecified: Secondary | ICD-10-CM

## 2020-02-10 ENCOUNTER — Other Ambulatory Visit: Payer: Self-pay

## 2020-02-10 ENCOUNTER — Ambulatory Visit (INDEPENDENT_AMBULATORY_CARE_PROVIDER_SITE_OTHER): Payer: PPO | Admitting: Family Medicine

## 2020-02-10 ENCOUNTER — Encounter (INDEPENDENT_AMBULATORY_CARE_PROVIDER_SITE_OTHER): Payer: Self-pay | Admitting: Family Medicine

## 2020-02-10 VITALS — BP 107/69 | HR 70 | Temp 98.1°F | Ht 63.0 in | Wt 221.0 lb

## 2020-02-10 DIAGNOSIS — E7849 Other hyperlipidemia: Secondary | ICD-10-CM

## 2020-02-10 DIAGNOSIS — I10 Essential (primary) hypertension: Secondary | ICD-10-CM

## 2020-02-10 DIAGNOSIS — Z6839 Body mass index (BMI) 39.0-39.9, adult: Secondary | ICD-10-CM | POA: Diagnosis not present

## 2020-02-10 MED ORDER — ATORVASTATIN CALCIUM 80 MG PO TABS: 80.0000 mg | ORAL_TABLET | Freq: Every day | ORAL | 0 refills | Status: DC

## 2020-02-10 MED ORDER — LISINOPRIL 10 MG PO TABS: 5.0000 mg | ORAL_TABLET | Freq: Every day | ORAL | 0 refills | Status: DC

## 2020-02-11 NOTE — Progress Notes (Signed)
Chief Complaint:   OBESITY [name] is here to discuss her progress with her obesity treatment plan along with follow-up of her obesity related diagnoses. [name] is on keeping a food journal and adhering to recommended goals of 1200 calories and 90+ grams of protein and states she is following her eating plan approximately 100% of the time. [name] states she is walking for 20 minutes 5 times per week.  Today's visit was #: 21 Starting weight: 267 lbs Starting date: 10/31/2018 Today's weight: 221 lbs Today's date: 02/10/2020 Total lbs lost to date: 46 lbs Total lbs lost since last in-office visit: 9 lbs  Interim History: Airi feels that her weight loss is catching up and she is concentrating on protein content, averaging 90-95 grams per day.  Her most logged foods were a spinach wrap and coffee.  Her garden vegetables are coming in, so she is eating significant fresh vegetables.  Averaging about 1290 calories per day.  Some hunger in the morning.  She says her biggest meal is lunch.  Subjective:   1. Other hyperlipidemia [name] has hyperlipidemia and has been trying to improve her cholesterol levels with intensive lifestyle modification including a low saturated fat diet, exercise and weight loss. She denies any chest pain, claudication.  She is taking atorvastatin.  Occasional myalgias.  Lab Results  Component Value Date   ALT 18 02/02/2020   AST 20 02/02/2020   ALKPHOS 70 11/28/2019   BILITOT 0.6 02/02/2020   Lab Results  Component Value Date   CHOL 207 (H) 01/20/2020   HDL 57 01/20/2020   LDLCALC 142 (H) 01/20/2020   TRIG 47 01/20/2020   CHOLHDL 3.8 11/06/2017   2. Essential hypertension Review: taking medications as instructed, no medication side effects noted, no chest pain on exertion, no dyspnea on exertion, no swelling of ankles.  Blood pressure is well-controlled (blood pressure low today and at last appointment).  BP Readings from Last 3 Encounters:    02/10/20 107/69  02/02/20 111/73  01/13/20 90/62   Assessment/Plan:   1. Other hyperlipidemia Cardiovascular risk and specific lipid/LDL goals reviewed.  We discussed several lifestyle modifications today and Korrina will continue to work on diet, exercise and weight loss efforts. Orders and follow up as documented in patient record.   Counseling Intensive lifestyle modifications are the first line treatment for this issue. . Dietary changes: Increase soluble fiber. Decrease simple carbohydrates. . Exercise changes: Moderate to vigorous-intensity aerobic activity 150 minutes per week if tolerated. . Lipid-lowering medications: see documented in medical record. - atorvastatin (LIPITOR) 80 MG tablet; Take 1 tablet (80 mg total) by mouth daily.  Dispense: 30 tablet; Refill: 0  2. Essential hypertension [name] is working on healthy weight loss and exercise to improve blood pressure control. We will watch for signs of hypotension as she continues her lifestyle modifications.  Will decrease lisinopril to 5 mg daily. - lisinopril (ZESTRIL) 10 MG tablet; Take 0.5 tablets (5 mg total) by mouth daily.  Dispense: 30 tablet; Refill: 0  3. Class 2 severe obesity with serious comorbidity and body mass index (BMI) of 39.0 to 39.9 in adult, unspecified obesity type [name] is currently in the action stage of change. As such, her goal is to continue with weight loss efforts. She has agreed to keeping a food journal and adhering to recommended goals of 1200 calories and 90+ grams of protein.   Exercise goals: As is.  Behavioral modification strategies: increasing lean protein intake, increasing vegetables, meal planning  and cooking strategies, keeping healthy foods in the home and planning for success.  [name] has agreed to follow-up with our clinic in 4 weeks. She was informed of the importance of frequent follow-up visits to maximize her success with intensive lifestyle modifications for her  multiple health conditions.   Objective:   Blood pressure 107/69, pulse 70, temperature 98.1 F (36.7 C), temperature source Oral, height 5\' 3"  (1.6 m), weight 221 lb (100.2 kg), SpO2 95 %. Body mass index is 39.15 kg/m.  General: Cooperative, alert, well developed, in no acute distress. HEENT: Conjunctivae and lids unremarkable. Cardiovascular: Regular rhythm.  Lungs: Normal work of breathing. Neurologic: No focal deficits.   Lab Results  Component Value Date   CREATININE 0.95 02/02/2020   BUN 21 02/02/2020   NA 142 02/02/2020   K 5.4 (H) 02/02/2020   CL 108 02/02/2020   CO2 27 02/02/2020   Lab Results  Component Value Date   ALT 18 02/02/2020   AST 20 02/02/2020   ALKPHOS 70 11/28/2019   BILITOT 0.6 02/02/2020   Lab Results  Component Value Date   HGBA1C 5.4 02/27/2019   HGBA1C 5.5 10/31/2018   Lab Results  Component Value Date   INSULIN 8.3 02/27/2019   INSULIN 12.3 10/31/2018   Lab Results  Component Value Date   TSH 1.180 10/31/2018   Lab Results  Component Value Date   CHOL 207 (H) 01/20/2020   HDL 57 01/20/2020   LDLCALC 142 (H) 01/20/2020   TRIG 47 01/20/2020   CHOLHDL 3.8 11/06/2017   Lab Results  Component Value Date   WBC 4.5 02/02/2020   HGB 14.3 02/02/2020   HCT 45.0 02/02/2020   MCV 90.7 02/02/2020   PLT 196 02/02/2020   Obesity Behavioral Intervention Documentation for Insurance:   Approximately 15 minutes were spent on the discussion below.  ASK: We discussed the diagnosis of obesity with [name] today and [name] agreed to give [place] permission to discuss obesity behavioral modification therapy today.  ASSESS: [name] has the diagnosis of obesity and her BMI today is 39.3. [name] is in the action stage of change.   ADVISE: [name] was educated on the multiple health risks of obesity as well as the benefit of weight loss to improve her health. She was advised of the need for long term treatment and the importance of lifestyle  modifications to improve her current health and to decrease her risk of future health problems.  AGREE: Multiple dietary modification options and treatment options were discussed and [name] agreed to follow the recommendations documented in the above note.  ARRANGE: [name] was educated on the importance of frequent visits to treat obesity as outlined per CMS and USPSTF guidelines and agreed to schedule her next follow up appointment today.  Attestation Statements:   Reviewed by clinician on day of visit: allergies, medications, problem list, medical history, surgical history, family history, social history, and previous encounter notes.  I, Water quality scientist, CMA, am acting as transcriptionist for Coralie Common, MD.  I have reviewed the above documentation for accuracy and completeness, and I agree with the above. - Jinny Blossom, MD

## 2020-02-12 ENCOUNTER — Ambulatory Visit (INDEPENDENT_AMBULATORY_CARE_PROVIDER_SITE_OTHER): Payer: PPO | Admitting: Psychiatry

## 2020-02-12 ENCOUNTER — Encounter (HOSPITAL_COMMUNITY): Payer: Self-pay | Admitting: Psychiatry

## 2020-02-12 ENCOUNTER — Other Ambulatory Visit: Payer: Self-pay

## 2020-02-12 DIAGNOSIS — F331 Major depressive disorder, recurrent, moderate: Secondary | ICD-10-CM

## 2020-02-12 NOTE — Telephone Encounter (Signed)
Received patient portion of application along with income documents.  Faxed along with provider portion to [employer].  We will update when we receive a response.   Mariella Saa, PharmD, [place], CPP Clinical Specialty Pharmacist (Rheumatology and Pulmonology)  02/12/2020 8:34 AM
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
## 2020-02-25 ENCOUNTER — Encounter: Payer: Self-pay | Admitting: Rheumatology

## 2020-02-25 ENCOUNTER — Other Ambulatory Visit: Payer: Self-pay | Admitting: Nurse Practitioner

## 2020-02-25 NOTE — Telephone Encounter (Signed)
07/09/19 was last med check up

## 2020-03-01 ENCOUNTER — Telehealth: Payer: Self-pay | Admitting: Internal Medicine

## 2020-03-01 ENCOUNTER — Other Ambulatory Visit (INDEPENDENT_AMBULATORY_CARE_PROVIDER_SITE_OTHER): Payer: Self-pay | Admitting: Family Medicine

## 2020-03-01 DIAGNOSIS — E559 Vitamin D deficiency, unspecified: Secondary | ICD-10-CM

## 2020-03-01 NOTE — Telephone Encounter (Signed)
Spoke with pt. Pt wants to make sure her results for her TCS coming will be mailed to her. Pt is aware that RMR writes a letter to his pts and when the procedure notes have resulted, we will send a letter via mail. Pt was asked to call back if she doesn't receive a letter.

## 2020-03-01 NOTE — Telephone Encounter (Signed)
Called pt back.  She said that she thinks she may have lost her prep instructions.  She is going to come by today to pick up a copy.

## 2020-03-01 NOTE — Telephone Encounter (Signed)
Patient needs to be called with her instructions for dulcolax prep   She said she can not find them

## 2020-03-01 NOTE — Telephone Encounter (Signed)
Pt came to front office window to pick up her prep instructions for her upcoming procedure with RMR. Before she left she said that SF did her last colonoscopy and she never heard back from [place] regarding her results or receiving a letter. SF did her colonoscopy on 10/12/2014 and it's documented that DS called and spoke with patient regarding her results on 10/15/2014. Pt is requesting a call and letter about her results with this procedure. I told her she would get both.

## 2020-03-03 ENCOUNTER — Other Ambulatory Visit: Payer: Self-pay

## 2020-03-03 ENCOUNTER — Other Ambulatory Visit (HOSPITAL_COMMUNITY)
Admission: RE | Admit: 2020-03-03 | Discharge: 2020-03-03 | Disposition: A | Discharge status: Home / Self Care | Payer: PPO | Source: Ambulatory Visit | Attending: Internal Medicine | Admitting: Internal Medicine

## 2020-03-03 DIAGNOSIS — Z01812 Encounter for preprocedural laboratory examination: Secondary | ICD-10-CM | POA: Insufficient documentation

## 2020-03-03 DIAGNOSIS — Z20822 Contact with and (suspected) exposure to covid-19: Secondary | ICD-10-CM | POA: Insufficient documentation

## 2020-03-03 LAB — SARS CORONAVIRUS 2 (TAT 6-24 HRS): SARS Coronavirus 2: NEGATIVE

## 2020-03-03 NOTE — Telephone Encounter (Signed)
Received notification from [employer], application has been APPROVED. Coverage is from 02/25/20 to 08/20/20.  Pt received 1st shipment on 02/27/2020.  Phone# [phone] Fax# [phone]

## 2020-03-05 ENCOUNTER — Encounter (HOSPITAL_COMMUNITY): Payer: Self-pay | Admitting: Internal Medicine

## 2020-03-05 ENCOUNTER — Ambulatory Visit (HOSPITAL_COMMUNITY)
Admission: RE | Admit: 2020-03-05 | Discharge: 2020-03-05 | Disposition: A | Discharge status: Home / Self Care | Payer: PPO | Attending: Internal Medicine | Admitting: Internal Medicine

## 2020-03-05 ENCOUNTER — Encounter (HOSPITAL_COMMUNITY)
Admission: RE | Disposition: A | Discharge status: Home / Self Care | Payer: Self-pay | Source: Home / Self Care | Attending: Internal Medicine

## 2020-03-05 ENCOUNTER — Other Ambulatory Visit: Payer: Self-pay

## 2020-03-05 DIAGNOSIS — Z1211 Encounter for screening for malignant neoplasm of colon: Secondary | ICD-10-CM | POA: Diagnosis not present

## 2020-03-05 DIAGNOSIS — Z6838 Body mass index (BMI) 38.0-38.9, adult: Secondary | ICD-10-CM | POA: Diagnosis not present

## 2020-03-05 DIAGNOSIS — Z86010 Personal history of colonic polyps: Secondary | ICD-10-CM

## 2020-03-05 DIAGNOSIS — M069 Rheumatoid arthritis, unspecified: Secondary | ICD-10-CM | POA: Insufficient documentation

## 2020-03-05 DIAGNOSIS — Z8719 Personal history of other diseases of the digestive system: Secondary | ICD-10-CM | POA: Diagnosis not present

## 2020-03-05 DIAGNOSIS — Z96653 Presence of artificial knee joint, bilateral: Secondary | ICD-10-CM | POA: Insufficient documentation

## 2020-03-05 DIAGNOSIS — Z79899 Other long term (current) drug therapy: Secondary | ICD-10-CM | POA: Insufficient documentation

## 2020-03-05 DIAGNOSIS — I10 Essential (primary) hypertension: Secondary | ICD-10-CM | POA: Insufficient documentation

## 2020-03-05 HISTORY — PX: COLONOSCOPY: SHX5424

## 2020-03-05 SURGERY — COLONOSCOPY
Anesthesia: Moderate Sedation

## 2020-03-05 MED ORDER — MIDAZOLAM HCL 5 MG/5ML IJ SOLN
INTRAMUSCULAR | Status: DC | PRN
  Administered 2020-03-05: 2 mg via INTRAVENOUS
  Administered 2020-03-05 (×2): 1 mg via INTRAVENOUS
  Administered 2020-03-05 (×2): 2 mg via INTRAVENOUS
  Administered 2020-03-05: 1 mg via INTRAVENOUS

## 2020-03-05 MED ORDER — ONDANSETRON HCL 4 MG/2ML IJ SOLN
INTRAMUSCULAR | Status: AC
  Filled 2020-03-05: qty 2

## 2020-03-05 MED ORDER — MEPERIDINE HCL 100 MG/ML IJ SOLN
INTRAMUSCULAR | Status: DC | PRN
  Administered 2020-03-05 (×2): 25 mg via INTRAVENOUS

## 2020-03-05 MED ORDER — MEPERIDINE HCL 50 MG/ML IJ SOLN
INTRAMUSCULAR | Status: AC
  Filled 2020-03-05: qty 1

## 2020-03-05 MED ORDER — ONDANSETRON HCL 4 MG/2ML IJ SOLN
INTRAMUSCULAR | Status: DC | PRN
  Administered 2020-03-05: 4 mg via INTRAVENOUS

## 2020-03-05 MED ORDER — SODIUM CHLORIDE 0.9 % IV SOLN
INTRAVENOUS | Status: DC
  Administered 2020-03-05: 1000 mL via INTRAVENOUS

## 2020-03-05 MED ORDER — MIDAZOLAM HCL 5 MG/5ML IJ SOLN
INTRAMUSCULAR | Status: AC
  Filled 2020-03-05: qty 10

## 2020-03-05 MED ORDER — STERILE WATER FOR IRRIGATION IR SOLN
Status: DC | PRN
  Administered 2020-03-05: 1.5 mL

## 2020-03-05 NOTE — Op Note (Signed)
[facility] Patient Name: [name] Procedure Date: 03/05/2020 8:29 AM MRN: [id number] Date of Birth: 03-28-1951 Attending MD: Norvel Richards , MD CSN: [id number] Age: 69 Admit Type: Outpatient Procedure:                Colonoscopy Indications:              High risk colon cancer surveillance: Personal                            history of colonic polyps Providers:                Norvel Richards, MD, Otis Peak B. Sharon Seller, RN,                            Nelma Rothman, Technician Referring MD:              Medicines:                Midazolam 9 mg IV, Meperidine 50 mg IV Complications:            No immediate complications. Estimated Blood Loss:     Estimated blood loss: none. Procedure:                Pre-Anesthesia Assessment:                           - Prior to the procedure, a History and Physical                            was performed, and patient medications and                            allergies were reviewed. The patient's tolerance of                            previous anesthesia was also reviewed. The risks                            and benefits of the procedure and the sedation                            options and risks were discussed with the patient.                            All questions were answered, and informed consent                            was obtained. Prior Anticoagulants: The patient has                            taken no previous anticoagulant or antiplatelet                            agents. ASA Grade Assessment: II - A patient with  mild systemic disease. After reviewing the risks                            and benefits, the patient was deemed in                            satisfactory condition to undergo the procedure.                           After obtaining informed consent, the colonoscope                            was passed under direct vision. Throughout the                            procedure,  the patient's blood pressure, pulse, and                            oxygen saturations were monitored continuously. The                            [id number]-[id number] ([id number]) scope was introduced through                            the anus and advanced to the the cecum, identified                            by appendiceal orifice and ileocecal valve. The                            colonoscopy was performed without difficulty. The                            patient tolerated the procedure well. The quality                            of the bowel preparation was adequate. Scope In: 9:04:54 AM Scope Out: 9:22:35 AM Scope Withdrawal Time: 0 hours 9 minutes 52 seconds  Total Procedure Duration: 0 hours 17 minutes 41 seconds  Findings:      The perianal and digital rectal examinations were normal.      The colon (entire examined portion) appeared normal.      The retroflexed view of the distal rectum and anal verge was normal and       showed no anal or rectal abnormalities. Impression:               - The entire examined colon is normal.                           - The distal rectum and anal verge are normal on                            retroflexion view.                           -  No specimens collected. Moderate Sedation:      Moderate (conscious) sedation was administered by the endoscopy nurse       and supervised by the endoscopist. The following parameters were       monitored: oxygen saturation, heart rate, blood pressure, respiratory       rate, EKG, adequacy of pulmonary ventilation, and response to care.       Total physician intraservice time was 31 minutes. Recommendation:           - Patient has a contact number available for                            emergencies. The signs and symptoms of potential                            delayed complications were discussed with the                            patient. Return to normal activities tomorrow.                            Written  discharge instructions were provided to the                            patient.                           - Resume previous diet.                           - Continue present medications.                           - Repeat colonoscopy in 7 years for surveillance.                           - Return to GI office (date not yet determined). Procedure Code(s):        --- Professional ---                           [phone], Colonoscopy, flexible; diagnostic, including                            collection of specimen(s) by brushing or washing,                            when performed (separate procedure)                           99153, Moderate sedation; each additional 15                            minutes intraservice time                           G0500, Moderate sedation services provided by the  same physician or other qualified health care                            professional performing a gastrointestinal                            endoscopic service that sedation supports,                            requiring the presence of an independent trained                            observer to assist in the monitoring of the                            patient's level of consciousness and physiological                            status; initial 15 minutes of intra-service time;                            patient age 59 years or older (additional time may                            be reported with [phone], as appropriate) Diagnosis Code(s):        --- Professional ---                           Z86.010, Personal history of colonic polyps CPT copyright 2019 American Medical Association. All rights reserved. The codes documented in this report are preliminary and upon coder review may  be revised to meet current compliance requirements. [name]. Catherine Brunelle, MD Norvel Richards, MD 03/05/2020 9:45:23 AM This report has been signed electronically. Number of Addenda: 0

## 2020-03-05 NOTE — Discharge Instructions (Signed)
  Colonoscopy Discharge Instructions  Read the instructions outlined below and refer to this sheet in the next few weeks. These discharge instructions provide you with general information on caring for yourself after you leave the hospital. Your doctor may also give you specific instructions. While your treatment has been planned according to the most current medical practices available, unavoidable complications occasionally occur. If you have any problems or questions after discharge, call Dr. Gala Romney at [phone]. ACTIVITY  You may resume your regular activity, but move at a slower pace for the next 24 hours.   Take frequent rest periods for the next 24 hours.   Walking will help get rid of the air and reduce the bloated feeling in your belly (abdomen).   No driving for 24 hours (because of the medicine (anesthesia) used during the test).    Do not sign any important legal documents or operate any machinery for 24 hours (because of the anesthesia used during the test).  NUTRITION  Drink plenty of fluids.   You may resume your normal diet as instructed by your doctor.   Begin with a light meal and progress to your normal diet. Heavy or fried foods are harder to digest and may make you feel sick to your stomach (nauseated).   Avoid alcoholic beverages for 24 hours or as instructed.  MEDICATIONS  You may resume your normal medications unless your doctor tells you otherwise.  WHAT YOU CAN EXPECT TODAY  Some feelings of bloating in the abdomen.   Passage of more gas than usual.   Spotting of blood in your stool or on the toilet paper.  IF YOU HAD POLYPS REMOVED DURING THE COLONOSCOPY:  No aspirin products for 7 days or as instructed.   No alcohol for 7 days or as instructed.   Eat a soft diet for the next 24 hours.  FINDING OUT THE RESULTS OF YOUR TEST Not all test results are available during your visit. If your test results are not back during the visit, make an appointment  with your caregiver to find out the results. Do not assume everything is normal if you have not heard from your caregiver or the medical facility. It is important for you to follow up on all of your test results.  SEEK IMMEDIATE MEDICAL ATTENTION IF:  You have more than a spotting of blood in your stool.   Your belly is swollen (abdominal distention).   You are nauseated or vomiting.   You have a temperature over 101.   You have abdominal pain or discomfort that is severe or gets worse throughout the day.   No polyps found today  I recommend you have a repeat colonoscopy in 7 years  At patient request, I called Dennis at [phone] and left a message

## 2020-03-05 NOTE — H&P (Signed)
@LOGO @   Primary Care Physician:  [name], MD Primary Gastroenterologist:  Dr. Gala Romney  Pre-Procedure History & Physical: HPI:  [name] is a 69 y.o. female here for surveillance colonoscopy.  History colonic adenomas removed with Dr. Oneida Alar 5 years ago.  Past Medical History:  Diagnosis Date  . Allergic rhinitis   . Anxiety   . Arthritis   . Asthma   . Constipation   . Depression   . Gallbladder problem   . Hematuria 08/25/2015  . Hyperlipidemia   . Hypertension   . Obesity   . Osteoarthritis   . Pelvic pain in female 08/25/2015  . Rheumatoid arthritis ([place])   . Swallowing difficulty   . Swelling     Past Surgical History:  Procedure Laterality Date  . BACK SURGERY    . CHOLECYSTECTOMY    . COLONOSCOPY  2007   sessile sigmoid polyp x 1; path: serrated adenoma  . COLONOSCOPY N/A 10/12/2014   Procedure: COLONOSCOPY;  Surgeon: [name], MD;  Location: [facility];  Service: Endoscopy;  Laterality: N/A;  . JOINT REPLACEMENT     BIL knee   . KNEE ARTHROPLASTY    . KNEE SURGERY     bilateral knee replacement  . LIPOMA EXCISION     x 2  . TONSILLECTOMY AND ADENOIDECTOMY      Prior to Admission medications   Medication Sig Start Date End Date Taking? Authorizing Provider  acetaminophen (TYLENOL) 500 MG tablet Take 500-1,000 mg by mouth every 6 (six) hours as needed (pain).    Yes [provider]  atorvastatin (LIPITOR) 80 MG tablet Take 1 tablet (80 mg total) by mouth daily. Patient taking differently: Take 40-80 mg by mouth See admin instructions. Alternating between 40 mg & 80 mg by mouth every other day in the morning 02/10/20  Yes Eber Jones, MD  B Complex Vitamins (VITAMIN B COMPLEX) TABS Take 1 tablet by mouth at bedtime.    Yes [provider]  hydroxychloroquine (PLAQUENIL) 200 MG tablet TAKE 1 TABLET BY MOUTH TWICE A DAY Patient taking differently: Take 200 mg by mouth in the morning and at bedtime.  02/06/20  Yes  Deveshwar, Abel Presto, MD  lisinopril (ZESTRIL) 10 MG tablet Take 0.5 tablets (5 mg total) by mouth daily. Patient taking differently: Take 10 mg by mouth daily.  02/10/20  Yes Eber Jones, MD  Probiotic Product (ALIGN PO) Take 1 capsule by mouth daily.    Yes [provider]  Propylene Glycol (SYSTANE BALANCE OP) Place 1 drop into both eyes 4 (four) times daily as needed (dry/irritated eyes.).    Yes [provider]  triamcinolone cream (KENALOG) 0.1 % Apply 1 application topically 2 (two) times daily. Prn rash; use up to 2 weeks Patient taking differently: Apply 1 application topically 2 (two) times daily as needed (skin irritation/rash.).  01/07/20  Yes Nilda Simmer, NP  Vitamin D, Ergocalciferol, (DRISDOL) 1.25 MG (50000 UNIT) CAPS capsule Take 1 capsule (50,000 Units total) by mouth every 7 (seven) days. Patient taking differently: Take 50,000 Units by mouth every Sunday.  12/02/19  Yes Eber Jones, MD  chlorzoxazone (PARAFON) 500 MG tablet Take 500 mg by mouth daily as needed for muscle spasms (typically with travel only).     [provider]  famotidine (PEPCID) 40 MG tablet TAKE ONE TABLET DAILY AS NEEDED 02/25/20   [name] Drown, MD    Allergies as of 12/16/2019 - Review Complete  12/15/2019  Allergen Reaction Noted  . Codeine  02/21/2007  . Cyclosporine Swelling 11/02/2015    Family History  Problem Relation Age of Onset  . Heart attack Father   . Alcoholism Father   . Depression Father   . Heart disease Father   . Hyperlipidemia Father   . Hypertension Father   . Leukemia Mother   . Hypertension Mother   . Cancer Mother   . Depression Mother   . Obesity Sister   . Other Sister        breathing problems  . Hepatitis C Sister   . Arthritis Sister   . Depression Sister   . Emphysema Brother   . Obesity Daughter   . Polycystic ovary syndrome Daughter   . Obesity Son   . Other Son        knee problems  . Cancer Maternal  Grandmother        breast  . Other Brother        aneursym  . Arthritis Sister   . Depression Sister   . Obesity Son   . Asthma Son   . Obesity Son   . Diabetes Other        runs on dad's side of the family  . Colon cancer Neg Hx        states she doesn't know a lot about her family, but has never heard of specific colon ca    Social History   Socioeconomic History  . Marital status: Married    Spouse name: Kenyia Wambolt  . Number of children: 4  . Years of education: Not on file  . Highest education level: Not on file  Occupational History  . Occupation: Stay at home  Tobacco Use  . Smoking status: Never Smoker  . Smokeless tobacco: Never Used  Vaping Use  . Vaping Use: Never used  Substance and Sexual Activity  . Alcohol use: Yes    Alcohol/week: 0.0 standard drinks    Comment: sometimes; rarely  . Drug use: No  . Sexual activity: Yes    Birth control/protection: Post-menopausal  Other Topics Concern  . Not on file  Social History Narrative  . Not on file   Social Determinants of Health   Financial Resource Strain:   . Difficulty of Paying Living Expenses:   Food Insecurity:   . Worried About Charity fundraiser in the Last Year:   . Arboriculturist in the Last Year:   Transportation Needs:   . Film/video editor (Medical):   [name] Lack of Transportation (Non-Medical):   Physical Activity:   . Days of Exercise per Week:   . Minutes of Exercise per Session:   Stress:   . Feeling of Stress :   Social Connections:   . Frequency of Communication with Friends and Family:   . Frequency of Social Gatherings with Friends and Family:   . Attends Religious Services:   . Active Member of Clubs or Organizations:   . Attends Archivist Meetings:   [name] Marital Status:   Intimate Partner Violence:   . Fear of Current or Ex-Partner:   . Emotionally Abused:   [name] Physically Abused:   . Sexually Abused:     Review of Systems: See HPI, otherwise negative  ROS  Physical Exam: BP (!) 119/57   Pulse 74   Temp 98.6 F (37 C) (Oral)   Resp 13   Ht 5\' 4"  (1.626 m)   Wt  102.5 kg   SpO2 97%   BMI 38.79 kg/m  General:   Alert,  Well-developed, well-nourished, pleasant and cooperative in NAD Neck:  Supple; no masses or thyromegaly. No significant cervical adenopathy. Lungs:  Clear throughout to auscultation.   No wheezes, crackles, or rhonchi. No acute distress. Heart:  Regular rate and rhythm; no murmurs, clicks, rubs,  or gallops. Abdomen: Non-distended, normal bowel sounds.  Soft and nontender without appreciable mass or hepatosplenomegaly.  Pulses:  Normal pulses noted. Extremities:  Without clubbing or edema.  Impression/Plan: 69 year old lady with history colonic adenoma; here for surveillance colonoscopy. The risks, benefits, limitations, alternatives and imponderables have been reviewed with the patient. Questions have been answered. All parties are agreeable.      Notice: This dictation was prepared with Dragon dictation along with smaller phrase technology. Any transcriptional errors that result from this process are unintentional and may not be corrected upon review.

## 2020-03-08 NOTE — Progress Notes (Signed)
Pharmacy Note  Subjective:   Patient presents to clinic today to receive first dose of Enbrel.  Patient running a fever or have signs/symptoms of infection? No  Patient currently on antibiotics for the treatment of infection? No  Patient have any upcoming invasive procedures/surgeries? No  Objective: CMP     Component Value Date/Time   NA 142 02/02/2020 1031   NA 141 11/28/2019 0843   K 5.4 (H) 02/02/2020 1031   CL 108 02/02/2020 1031   CO2 27 02/02/2020 1031   GLUCOSE 91 02/02/2020 1031   BUN 21 02/02/2020 1031   BUN 17 11/28/2019 0843   CREATININE 0.95 02/02/2020 1031   CALCIUM 10.6 (H) 02/02/2020 1031   PROT 6.7 02/02/2020 1031   PROT 6.5 11/28/2019 0843   ALBUMIN 4.3 11/28/2019 0843   AST 20 02/02/2020 1031   ALT 18 02/02/2020 1031   ALKPHOS 70 11/28/2019 0843   BILITOT 0.6 02/02/2020 1031   BILITOT 0.4 11/28/2019 0843   GFRNONAA 62 02/02/2020 1031   GFRAA 71 02/02/2020 1031    CBC    Component Value Date/Time   WBC 4.5 02/02/2020 1031   RBC 4.96 02/02/2020 1031   HGB 14.3 02/02/2020 1031   HGB 13.0 11/28/2019 0843   HCT 45.0 02/02/2020 1031   HCT 40.5 11/28/2019 0843   PLT 196 02/02/2020 1031   PLT 210 11/28/2019 0843   MCV 90.7 02/02/2020 1031   MCV 88 11/28/2019 0843   MCH 28.8 02/02/2020 1031   MCHC 31.8 (L) 02/02/2020 1031   RDW 15.9 (H) 02/02/2020 1031   RDW 14.2 11/28/2019 0843   LYMPHSABS 1,350 02/02/2020 1031   LYMPHSABS 1.7 11/28/2019 0843   MONOABS 924 03/06/2017 0923   EOSABS 90 02/02/2020 1031   EOSABS 0.1 11/28/2019 0843   BASOSABS 32 02/02/2020 1031   BASOSABS 0.0 11/28/2019 0843    Baseline Immunosuppressant Therapy Labs TB GOLD Quantiferon TB Gold Latest Ref Rng & Units 02/02/2020  Quantiferon TB Gold Plus NEGATIVE NEGATIVE   Hepatitis Panel Hepatitis Latest Ref Rng & Units 03/06/2017  Hep B Surface Ag NEGATIVE NEGATIVE  Hep B IgM NON REACTIVE NON REACTIVE  Hep C Ab NEGATIVE NEGATIVE   HIV Lab Results  Component Value Date    HIV NONREACTIVE 03/06/2017   Immunoglobulins Immunoglobulin Electrophoresis Latest Ref Rng & Units 03/06/2017  IgG 694 - 1,618 mg/dL 876  IgM 48 - 271 mg/dL 73   SPEP Serum Protein Electrophoresis Latest Ref Rng & Units 02/02/2020  Total Protein 6.1 - 8.1 g/dL 6.7  Albumin 3.8 - 4.8 g/dL -  Alpha-1 0.2 - 0.3 g/dL -  Alpha-2 0.5 - 0.9 g/dL -  Beta Globulin 0.4 - 0.6 g/dL -  Beta 2 0.2 - 0.5 g/dL -  Gamma Globulin 0.8 - 1.7 g/dL -   G6PD No results found for: G6PDH TPMT No results found for: TPMT   Chest x-ray: no active disease  Assessment/Plan:  Demonstrated proper injection technique with Enbrel Mini demo pen.  Patient able to demonstrate proper injection technique using the teach back method. Patient self injected in the right abdomen with:  Sample Medication: Enbrel Mini 50 mg/ml Lot: * Expiration:   Patient tolerated well.  Observed for 30 mins in office for adverse reaction and without issue.   Patient is to return in 1 month follow up and for labs.  Standing orders placed.  Patient was approved for [employer] safety net foundation and will be receiving medication through [facility].  Patient received her  first shipment on 02/27/2020.  All questions encouraged and answered.  Instructed patient to call with any further questions or concerns.  Mariella Saa, PharmD, [facility] Rheumatology Clinical Pharmacist  03/08/2020 11:36 AM

## 2020-03-09 ENCOUNTER — Encounter (INDEPENDENT_AMBULATORY_CARE_PROVIDER_SITE_OTHER): Payer: Self-pay | Admitting: Family Medicine

## 2020-03-09 ENCOUNTER — Ambulatory Visit (INDEPENDENT_AMBULATORY_CARE_PROVIDER_SITE_OTHER): Payer: PPO | Admitting: Family Medicine

## 2020-03-09 ENCOUNTER — Other Ambulatory Visit: Payer: Self-pay

## 2020-03-09 VITALS — BP 118/65 | HR 66 | Temp 98.1°F | Ht 63.0 in | Wt 220.0 lb

## 2020-03-09 DIAGNOSIS — I10 Essential (primary) hypertension: Secondary | ICD-10-CM

## 2020-03-09 DIAGNOSIS — E559 Vitamin D deficiency, unspecified: Secondary | ICD-10-CM

## 2020-03-09 DIAGNOSIS — Z6839 Body mass index (BMI) 39.0-39.9, adult: Secondary | ICD-10-CM | POA: Diagnosis not present

## 2020-03-10 ENCOUNTER — Ambulatory Visit (INDEPENDENT_AMBULATORY_CARE_PROVIDER_SITE_OTHER): Payer: PPO | Admitting: Pharmacist

## 2020-03-10 ENCOUNTER — Encounter (HOSPITAL_COMMUNITY): Payer: Self-pay | Admitting: Internal Medicine

## 2020-03-10 VITALS — BP 119/69 | HR 61

## 2020-03-10 DIAGNOSIS — Z7189 Other specified counseling: Secondary | ICD-10-CM | POA: Diagnosis not present

## 2020-03-10 DIAGNOSIS — M0579 Rheumatoid arthritis with rheumatoid factor of multiple sites without organ or systems involvement: Secondary | ICD-10-CM

## 2020-03-10 LAB — VITAMIN D 25 HYDROXY (VIT D DEFICIENCY, FRACTURES): Vit D, 25-Hydroxy: 49.1 ng/mL (ref 30.0–100.0)

## 2020-03-10 MED ORDER — ENBREL MINI 50 MG/ML ~~LOC~~ SOCT: 50.0000 mg | Freq: Once | SUBCUTANEOUS | 0 refills | Status: DC

## 2020-03-10 NOTE — Patient Instructions (Addendum)
Remember the 5 C's:  COUNTER- leave on the counter at least 30 mins but up to overnight to bring medication to room temperature and prevent stinging  COLD- Placing something cold (like and ice gel pack or cold water bottle) on the injection site just before cleansing with alcohol may help reduce pain  CLARITIN- for the first two weeks of treatment or  the day of, the day before, and the day after injecting to minimize injection site reactions  CORTISONE CREAM- apply if injection site is irritated and itching  CALL ME- if injection site reaction is bigger than the size of your fist, looks infected, blisters, or develop hives  Standing Labs We placed an order today for your standing lab work.   Please have your standing labs drawn in 1 month and then every 3 months.  If possible, please have your labs drawn 2 weeks prior to your appointment so that the provider can discuss your results at your appointment.  We have open lab daily Monday through Thursday from 8:30-12:30 PM and 1:30-4:30 PM and Friday from 8:30-12:30 PM and 1:30-4:00 PM at the office of Dr. Shaili Deveshwar, [facility].   Please be advised, patients with office appointments requiring lab work will take precedents over walk-in lab work.  If possible, please come for your lab work on Monday and Friday afternoons, as you may experience shorter wait times. The office is located at [address], [address], [place], [place] [address] No appointment is necessary.   Labs are drawn by [facility]. Please bring your co-pay at the time of your lab draw.  You may receive a bill from [facility] for your lab work.  If you wish to have your labs drawn at another location, please call the office 24 hours in advance to send orders.  If you have any questions regarding directions or hours of operation,  please call [phone].   As a reminder, please drink plenty of water prior to coming for your lab work. Thanks!  

## 2020-03-11 NOTE — Progress Notes (Signed)
Chief Complaint:   OBESITY [name] is here to discuss her progress with her obesity treatment plan along with follow-up of her obesity related diagnoses. [name] is on keeping a food journal and adhering to recommended goals of 1200 calories and 90+ grams of protein daily and states she is following her eating plan approximately 80% of the time. [name] states she is walking for 20 minutes 3 times per week.  Today's visit was #: 22 Starting weight: 267 lbs Starting date: 10/31/2018 Today's weight: 220 lbs Today's date: 03/09/2020 Total lbs lost to date: 47 Total lbs lost since last in-office visit: 1  Interim History: [name] had a colonoscopy in the past few weeks, and everything was fine. She also has been canning peppers. She has been sticking to the plan most days. She is getting back to eating after her colonoscopy was harder than she expected. She is always getting >90 grams of protein and generally getting over 1200 calories.  Subjective:   1. Vitamin D deficiency [name] denies nausea, vomiting, or muscle weakness, but she notes fatigue. Last Vit D level was 63.  2. Essential hypertension [name]'s blood pressure is well controlled. She denies chest pain, chest pressure, or headaches. She is on lisinopril 5 mg.  Assessment/Plan:   1. Vitamin D deficiency Low Vitamin D level contributes to fatigue and are associated with obesity, breast, and colon cancer. We will check labs today. [name] will follow-up for routine testing of Vitamin D, at least 2-3 times per year to avoid over-replacement.  - VITAMIN D 25 Hydroxy (Vit-D Deficiency, Fractures)  2. Essential hypertension [name] is working on healthy weight loss and exercise to improve blood pressure control. We will watch for signs of hypotension as she continues her lifestyle modifications. [name] will continue her current medications.  3. Class 2 severe obesity with serious comorbidity and body mass index (BMI) of  39.0 to 39.9 in adult, unspecified obesity type [name] is currently in the action stage of change. As such, her goal is to continue with weight loss efforts. She has agreed to keeping a food journal and adhering to recommended goals of 1200 calories and 90+ grams of protein daily.   Exercise goals: All adults should avoid inactivity. Some physical activity is better than none, and adults who participate in any amount of physical activity gain some health benefits.  Behavioral modification strategies: increasing lean protein intake, meal planning and cooking strategies, keeping healthy foods in the home, planning for success and keeping a strict food journal.  [name] has agreed to follow-up with our clinic in 4 weeks. She was informed of the importance of frequent follow-up visits to maximize her success with intensive lifestyle modifications for her multiple health conditions.   [name] was informed we would discuss her lab results at her next visit unless there is a critical issue that needs to be addressed sooner. [name] agreed to keep her next visit at the agreed upon time to discuss these results.  Objective:   Blood pressure 118/65, pulse 66, temperature 98.1 F (36.7 C), temperature source Oral, height 5\' 3"  (1.6 m), weight 220 lb (99.8 kg), SpO2 100 %. Body mass index is 38.97 kg/m.  General: Cooperative, alert, well developed, in no acute distress. HEENT: Conjunctivae and lids unremarkable. Cardiovascular: Regular rhythm.  Lungs: Normal work of breathing. Neurologic: No focal deficits.   Lab Results  Component Value Date   CREATININE 0.95 02/02/2020   BUN 21 02/02/2020   NA 142 02/02/2020  K 5.4 (H) 02/02/2020   CL 108 02/02/2020   CO2 27 02/02/2020   Lab Results  Component Value Date   ALT 18 02/02/2020   AST 20 02/02/2020   ALKPHOS 70 11/28/2019   BILITOT 0.6 02/02/2020   Lab Results  Component Value Date   HGBA1C 5.4 02/27/2019   HGBA1C 5.5 10/31/2018     Lab Results  Component Value Date   INSULIN 8.3 02/27/2019   INSULIN 12.3 10/31/2018   Lab Results  Component Value Date   TSH 1.180 10/31/2018   Lab Results  Component Value Date   CHOL 207 (H) 01/20/2020   HDL 57 01/20/2020   LDLCALC 142 (H) 01/20/2020   TRIG 47 01/20/2020   CHOLHDL 3.8 11/06/2017   Lab Results  Component Value Date   WBC 4.5 02/02/2020   HGB 14.3 02/02/2020   HCT 45.0 02/02/2020   MCV 90.7 02/02/2020   PLT 196 02/02/2020   No results found for: IRON, TIBC, FERRITIN  Obesity Behavioral Intervention Documentation for Insurance:   Approximately 15 minutes were spent on the discussion below.  ASK: We discussed the diagnosis of obesity with [name] today and [name] agreed to give [place] permission to discuss obesity behavioral modification therapy today.  ASSESS: [name] has the diagnosis of obesity and her BMI today is 38.98. [name] is in the action stage of change.   ADVISE: [name] was educated on the multiple health risks of obesity as well as the benefit of weight loss to improve her health. She was advised of the need for long term treatment and the importance of lifestyle modifications to improve her current health and to decrease her risk of future health problems.  AGREE: Multiple dietary modification options and treatment options were discussed and [name] agreed to follow the recommendations documented in the above note.  ARRANGE: [name] was educated on the importance of frequent visits to treat obesity as outlined per CMS and USPSTF guidelines and agreed to schedule her next follow up appointment today.  Attestation Statements:   Reviewed by clinician on day of visit: allergies, medications, problem list, medical history, surgical history, family history, social history, and previous encounter notes.   I, Trixie Dredge, am acting as transcriptionist for Coralie Common, MD.  I have reviewed the above documentation for accuracy and  completeness, and I agree with the above. - Jinny Blossom, MD

## 2020-03-15 ENCOUNTER — Ambulatory Visit (INDEPENDENT_AMBULATORY_CARE_PROVIDER_SITE_OTHER): Payer: PPO | Admitting: Psychiatry

## 2020-03-15 ENCOUNTER — Other Ambulatory Visit: Payer: Self-pay

## 2020-03-15 DIAGNOSIS — F331 Major depressive disorder, recurrent, moderate: Secondary | ICD-10-CM
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
## 2020-03-18 LAB — PHOSPHORUS: Phosphorus: 3.2 mg/dL (ref 2.1–4.3)

## 2020-03-18 LAB — T4, FREE: Free T4: 1.3 ng/dL (ref 0.8–1.8)

## 2020-03-18 LAB — MAGNESIUM: Magnesium: 1.6 mg/dL (ref 1.5–2.5)

## 2020-03-18 LAB — PTH, INTACT AND CALCIUM
Calcium: 10 mg/dL (ref 8.6–10.4)
PTH: 91 pg/mL — ABNORMAL HIGH (ref 14–64)

## 2020-03-18 LAB — TSH: TSH: 2.53 mIU/L (ref 0.40–4.50)

## 2020-03-23 LAB — CALCIUM, URINE, 24 HOUR: Calcium, 24H Urine: 80 mg/24 h

## 2020-03-23 LAB — CREATININE, URINE, 24 HOUR: Creatinine, 24H Ur: 0.77 g/(24.h) (ref 0.50–2.15)

## 2020-03-24 DIAGNOSIS — Z79899 Other long term (current) drug therapy: Secondary | ICD-10-CM | POA: Diagnosis not present

## 2020-03-29 ENCOUNTER — Other Ambulatory Visit: Payer: Self-pay | Admitting: Family Medicine

## 2020-03-29 ENCOUNTER — Ambulatory Visit (INDEPENDENT_AMBULATORY_CARE_PROVIDER_SITE_OTHER): Payer: PPO | Admitting: Psychiatry

## 2020-03-29 ENCOUNTER — Other Ambulatory Visit: Payer: Self-pay

## 2020-03-29 DIAGNOSIS — F330 Major depressive disorder, recurrent, mild: Secondary | ICD-10-CM | POA: Diagnosis not present

## 2020-03-29 DIAGNOSIS — F411 Generalized anxiety disorder: Secondary | ICD-10-CM
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
## 2020-04-01 ENCOUNTER — Ambulatory Visit: Payer: PPO | Admitting: "Endocrinology

## 2020-04-01 ENCOUNTER — Encounter: Payer: Self-pay | Admitting: "Endocrinology

## 2020-04-01 ENCOUNTER — Other Ambulatory Visit: Payer: Self-pay

## 2020-04-01 DIAGNOSIS — M85832 Other specified disorders of bone density and structure, left forearm: Secondary | ICD-10-CM

## 2020-04-01 DIAGNOSIS — M85831 Other specified disorders of bone density and structure, right forearm: Secondary | ICD-10-CM

## 2020-04-01 NOTE — Progress Notes (Signed)
04/01/2020, 12:54 PM  Endocrinology follow-up note  [name] is a 69 y.o.-year-old female, referred for evaluation for hypercalcemia.   Past Medical History:  Diagnosis Date  . Allergic rhinitis   . Anxiety   . Arthritis   . Asthma   . Constipation   . Depression   . Gallbladder problem   . Hematuria 08/25/2015  . Hyperlipidemia   . Hypertension   . Obesity   . Osteoarthritis   . Pelvic pain in female 08/25/2015  . Rheumatoid arthritis ([place])   . Swallowing difficulty   . Swelling     Past Surgical History:  Procedure Laterality Date  . BACK SURGERY    . CHOLECYSTECTOMY    . COLONOSCOPY  2007   sessile sigmoid polyp x 1; path: serrated adenoma  . COLONOSCOPY N/A 10/12/2014   Procedure: COLONOSCOPY;  Surgeon: [name], MD;  [facility];  Service: Endoscopy;  Laterality: N/A;  . COLONOSCOPY N/A 03/05/2020   Procedure: COLONOSCOPY;  Surgeon: Daneil Dolin, MD;  Location: [facility];  Service: Endoscopy;  Laterality: N/A;  9:30  . JOINT REPLACEMENT     BIL knee   . KNEE ARTHROPLASTY    . KNEE SURGERY     bilateral knee replacement  . LIPOMA EXCISION     x 2  . TONSILLECTOMY AND ADENOIDECTOMY      Social History   Tobacco Use  . Smoking status: Never Smoker  . Smokeless tobacco: Never Used  Vaping Use  . Vaping Use: Never used  Substance Use Topics  . Alcohol use: Yes    Alcohol/week: 0.0 standard drinks    Comment: sometimes; rarely  . Drug use: No    Family History  Problem Relation Age of Onset  . Heart attack Father   . Alcoholism Father   . Depression Father   . Heart disease Father   . Hyperlipidemia Father   . Hypertension Father   . Leukemia Mother   . Hypertension Mother   . Cancer Mother   . Depression Mother   . Obesity Sister   . Other Sister        breathing problems  . Hepatitis C Sister   . Arthritis Sister   . Depression Sister   . Emphysema  Brother   . Obesity Daughter   . Polycystic ovary syndrome Daughter   . Obesity Son   . Other Son        knee problems  . Cancer Maternal Grandmother        breast  . Other Brother        aneursym  . Arthritis Sister   . Depression Sister   . Obesity Son   . Asthma Son   . Obesity Son   . Diabetes Other        runs on dad's side of the family  . Colon cancer Neg Hx        states she doesn't know a lot about her family, but has never heard of specific colon ca    Outpatient Encounter Medications as of 04/01/2020  Medication Sig  . Cholecalciferol (VITAMIN D) 125 MCG (  5000 UT) CAPS Take 5,000 Units by mouth daily after breakfast.  . acetaminophen (TYLENOL) 500 MG tablet Take 500-1,000 mg by mouth every 6 (six) hours as needed (pain).   [name] atorvastatin (LIPITOR) 80 MG tablet Take 1 tablet (80 mg total) by mouth daily. (Patient taking differently: Take 40-80 mg by mouth See admin instructions. Alternating between 40 mg & 80 mg by mouth every other day in the morning)  . B Complex Vitamins (VITAMIN B COMPLEX) TABS Take 1 tablet by mouth at bedtime.   . chlorzoxazone (PARAFON) 500 MG tablet Take 500 mg by mouth daily as needed for muscle spasms (typically with travel only).   . Etanercept (ENBREL MINI) 50 MG/ML SOCT Inject 50 mg into the skin once for 1 dose.  . famotidine (PEPCID) 40 MG tablet TAKE ONE TABLET DAILY AS NEEDED (Patient taking differently: 40 mg at bedtime. )  . hydroxychloroquine (PLAQUENIL) 200 MG tablet TAKE 1 TABLET BY MOUTH TWICE A DAY (Patient taking differently: Take 200 mg by mouth in the morning and at bedtime. )  . lisinopril (ZESTRIL) 10 MG tablet Take 0.5 tablets (5 mg total) by mouth daily. (Patient taking differently: Take 10 mg by mouth daily. )  . Probiotic Product (ALIGN PO) Take 1 capsule by mouth daily.   [name] Propylene Glycol (SYSTANE BALANCE OP) Place 1 drop into both eyes 4 (four) times daily as needed (dry/irritated eyes.).   [name] triamcinolone cream  (KENALOG) 0.1 % Apply 1 application topically 2 (two) times daily. Prn rash; use up to 2 weeks (Patient taking differently: Apply 1 application topically 2 (two) times daily as needed (skin irritation/rash.). )  . [DISCONTINUED] Vitamin D, Ergocalciferol, (DRISDOL) 1.25 MG (50000 UNIT) CAPS capsule Take 1 capsule (50,000 Units total) by mouth every 7 (seven) days. (Patient taking differently: Take 50,000 Units by mouth every Sunday. )   Facility-Administered Encounter Medications as of 04/01/2020  Medication  . methylPREDNISolone acetate (DEPO-MEDROL) injection 40 mg  . methylPREDNISolone acetate (DEPO-MEDROL) injection 40 mg    Allergies  Allergen Reactions  . Codeine Nausea Only  . Cyclosporine Swelling  . Propylene Glycol Nausea And Vomiting     HPI  [name] was diagnosed with hypercalcemia approximately at age 18.  She reports that she never required any intervention.  Patient has no previously known history of parathyroid, pituitary, adrenal dysfunctions; no family history of such dysfunctions. -Review of herreferral package of most recent labs reveals calcium of 10.5 the corresponding PTH of 33 on April 10, 2019.  Her highest calcium has been 10.9 in July 2019.  After she was off of Tums for 3 months, her repeat labs show calcium of 10 along with PTH of 91.  Her 24-hour urine calcium was not elevated at 80.  Her recent DEXA scan shows osteopenia of femur and forearm. She did have a normal DEXA scan in 2009. No prior history of fragility fractures or falls.  She reports rheumatoid arthritis.  No history of  kidney stones.  No history of CKD.  she is not on HCTZ or other thiazide therapy, however she has used Tums for acid reflux for a long time and still on it.   No history of  vitamin D deficiency. Last vitamin D level was 41 in April 2021.  she  eats dairy and green, leafy, vegetables on average amounts.  she does not have a family history of hypercalcemia, pituitary  tumors, thyroid cancer, or osteoporosis.   ROS:  Constitutional: + Minimally fluctuating  body weight, current BMI 39.18 , no fatigue, no subjective hyperthermia, no subjective hypothermia Eyes: no blurry vision, no xerophthalmia ENT: no sore throat, no nodules palpated in throat, no dysphagia/odynophagia, no hoarseness Cardiovascular: no Chest Pain, no Shortness of Breath, no palpitations, no leg swelling Respiratory: no cough, no shortness of breath  Gastrointestinal: no Nausea/Vomiting/Diarhhea Musculoskeletal: no muscle/joint aches Skin: no rashes Neurological: no tremors, no numbness, no tingling, no dizziness Psychiatric: no depression, no anxiety  PE: BP 114/74   Pulse 60   Ht 5\' 3"  (1.6 m)   Wt 221 lb 3.2 oz (100.3 kg)   BMI 39.18 kg/m , Body mass index is 39.18 kg/m. Wt Readings from Last 3 Encounters:  04/01/20 221 lb 3.2 oz (100.3 kg)  03/09/20 220 lb (99.8 kg)  03/05/20 226 lb (102.5 kg)    Constitutional:  + BMI of 39.18, not in acute distress, normal state of mind Eyes: PERRLA, EOMI, no exophthalmos ENT: moist mucous membranes, no gross thyromegaly, no gross cervical lymphadenopathy  Musculoskeletal: no gross deformities, strength intact in all four extremities Skin: moist, warm, no rashes Neurological: no tremor with outstretched hands, Deep tendon reflexes normal in bilateral lower extremities.     CMP ( most recent) CMP     Component Value Date/Time   NA 142 02/02/2020 1031   NA 141 11/28/2019 0843   K 5.4 (H) 02/02/2020 1031   CL 108 02/02/2020 1031   CO2 27 02/02/2020 1031   GLUCOSE 91 02/02/2020 1031   BUN 21 02/02/2020 1031   BUN 17 11/28/2019 0843   CREATININE 0.95 02/02/2020 1031   CALCIUM 10.0 03/17/2020 0935   PROT 6.7 02/02/2020 1031   PROT 6.5 11/28/2019 0843   ALBUMIN 4.3 11/28/2019 0843   AST 20 02/02/2020 1031   ALT 18 02/02/2020 1031   ALKPHOS 70 11/28/2019 0843   BILITOT 0.6 02/02/2020 1031   BILITOT 0.4 11/28/2019 0843    GFRNONAA 62 02/02/2020 1031   GFRAA 71 02/02/2020 1031     Diabetic Labs (most recent): Lab Results  Component Value Date   HGBA1C 5.4 02/27/2019   HGBA1C 5.5 10/31/2018     Lipid Panel ( most recent) Lipid Panel     Component Value Date/Time   CHOL 207 (H) 01/20/2020 0926   TRIG 47 01/20/2020 0926   HDL 57 01/20/2020 0926   CHOLHDL 3.8 11/06/2017 1041   CHOLHDL 6.6 07/09/2014 0819   VLDL 13 07/09/2014 0819   LDLCALC 142 (H) 01/20/2020 0926   LABVLDL 8 01/20/2020 0926      Lab Results  Component Value Date   TSH 2.53 03/17/2020   TSH 1.180 10/31/2018   TSH 0.67 03/06/2017   TSH 1.899 03/18/2009   TSH 1.613 03/08/2007   FREET4 1.3 03/17/2020   FREET4 1.47 10/31/2018     Recent Results (from the past 2160 hour(s))  Lipid Panel With LDL/HDL Ratio     Status: Abnormal   Collection Time: 01/20/20  9:26 AM  Result Value Ref Range   Cholesterol, Total 207 (H) 100 - 199 mg/dL   Triglycerides 47 0 - 149 mg/dL   HDL 57 >39 mg/dL   VLDL Cholesterol Cal 8 5 - 40 mg/dL   LDL Chol Calc (NIH) 142 (H) 0 - 99 mg/dL   LDL/HDL Ratio 2.5 0.0 - 3.2 ratio    Comment:  LDL/HDL Ratio                                             Men  Women                               1/2 Avg.Risk  1.0    1.5                                   Avg.Risk  3.6    3.2                                2X Avg.Risk  6.2    5.0                                3X Avg.Risk  8.0    6.1   QuantiFERON-TB Gold Plus     Status: None   Collection Time: 02/02/20 10:31 AM  Result Value Ref Range   QuantiFERON-TB Gold Plus NEGATIVE NEGATIVE    Comment: Negative test result. M. tuberculosis complex  infection unlikely.    NIL 0.05 IU/mL   Mitogen-NIL 5.55 IU/mL   TB1-NIL 0.01 IU/mL   TB2-NIL 0.00 IU/mL    Comment: . The Nil tube value reflects the background interferon gamma immune response of the patient's blood sample. This value has been subtracted from the  patient's displayed TB and Mitogen results. . Lower than expected results with the Mitogen tube prevent false-negative Quantiferon readings by detecting a patient with a potential immune suppressive condition and/or suboptimal pre-analytical specimen handling. . The TB1 Antigen tube is coated with the M. tuberculosis-specific antigens designed to elicit responses from TB antigen primed CD4+ helper T-lymphocytes. . The TB2 Antigen tube is coated with the M. tuberculosis-specific antigens designed to elicit responses from TB antigen primed CD4+ helper and CD8+ cytotoxic T-lymphocytes. . For additional information, please refer to https://education.questdiagnostics.com/faq/FAQ204 (This link is being provided for informational/ educational purposes only.) .   CBC with Differential/Platelet     Status: Abnormal   Collection Time: 02/02/20 10:31 AM  Result Value Ref Range   WBC 4.5 3.8 - 10.8 Thousand/uL   RBC 4.96 3.80 - 5.10 Million/uL   Hemoglobin 14.3 11.7 - 15.5 g/dL   HCT 45.0 35 - 45 %   MCV 90.7 80.0 - 100.0 fL   MCH 28.8 27.0 - 33.0 pg   MCHC 31.8 (L) 32.0 - 36.0 g/dL   RDW 15.9 (H) 11.0 - 15.0 %   Platelets 196 140 - 400 Thousand/uL   MPV 10.3 7.5 - 12.5 fL   Neutro Abs 2,610 1,500 - 7,800 cells/uL   Lymphs Abs 1,350 850 - 3,900 cells/uL   Absolute Monocytes 419 200 - 950 cells/uL   Eosinophils Absolute 90 15 - 500 cells/uL   Basophils Absolute 32 0 - 200 cells/uL   Neutrophils Relative % 58 %   Total Lymphocyte 30.0 %   Monocytes Relative 9.3 %   Eosinophils Relative 2.0 %   Basophils Relative 0.7 %  COMPLETE METABOLIC PANEL WITH GFR     Status: Abnormal   Collection Time: 02/02/20 10:31  AM  Result Value Ref Range   Glucose, Bld 91 65 - 99 mg/dL    Comment: .            Fasting reference interval .    BUN 21 7 - 25 mg/dL   Creat 0.95 0.50 - 0.99 mg/dL    Comment: For patients >37 years of age, the reference limit for Creatinine is approximately 13%  higher for people identified as African-American. .    GFR, Est Non African American 62 > OR = 60 mL/min/1.56m2   GFR, Est African American 71 > OR = 60 mL/min/1.80m2   BUN/Creatinine Ratio NOT APPLICABLE 6 - 22 (calc)   Sodium 142 135 - 146 mmol/L   Potassium 5.4 (H) 3.5 - 5.3 mmol/L   Chloride 108 98 - 110 mmol/L   CO2 27 20 - 32 mmol/L   Calcium 10.6 (H) 8.6 - 10.4 mg/dL   Total Protein 6.7 6.1 - 8.1 g/dL   Albumin 4.2 3.6 - 5.1 g/dL   Globulin 2.5 1.9 - 3.7 g/dL (calc)   AG Ratio 1.7 1.0 - 2.5 (calc)   Total Bilirubin 0.6 0.2 - 1.2 mg/dL   Alkaline phosphatase (APISO) 63 37 - 153 U/L   AST 20 10 - 35 U/L   ALT 18 6 - 29 U/L  SARS CORONAVIRUS 2 (TAT 6-24 HRS) Nasopharyngeal Nasopharyngeal Swab     Status: None   Collection Time: 03/03/20  3:30 PM   Specimen: Nasopharyngeal Swab  Result Value Ref Range   SARS Coronavirus 2 NEGATIVE NEGATIVE    Comment: (NOTE) SARS-CoV-2 target nucleic acids are NOT DETECTED.  The SARS-CoV-2 RNA is generally detectable in upper and lower respiratory specimens during the acute phase of infection. Negative results do not preclude SARS-CoV-2 infection, do not rule out co-infections with other pathogens, and should not be used as the sole basis for treatment or other patient management decisions. Negative results must be combined with clinical observations, patient history, and epidemiological information. The expected result is Negative.  Fact Sheet for Patients: SugarRoll.be  Fact Sheet for Healthcare Providers: https://www.woods-mathews.com/  This test is not yet approved or cleared by the [place] FDA and  has been authorized for detection and/or diagnosis of SARS-CoV-2 by FDA under an Emergency Use Authorization (EUA). This EUA will remain  in effect (meaning this test can be used) for the duration of the COVID-19 declaration under Se ction 564(b)(1) of the Act, 21 U.S.C. section  360bbb-3(b)(1), unless the authorization is terminated or revoked sooner.  Performed at [facility], [place] [address], [place], [place] [address]   VITAMIN D 25 Hydroxy (Vit-D Deficiency, Fractures)     Status: None   Collection Time: 03/09/20  3:29 PM  Result Value Ref Range   Vit D, 25-Hydroxy 49.1 30.0 - 100.0 ng/mL    Comment: Vitamin D deficiency has been defined by the [place] practice guideline as a level of serum 25-OH vitamin D less than 20 ng/mL (1,2). The Endocrine Society went on to further define vitamin D insufficiency as a level between 21 and 29 ng/mL (2). 1. IOM (Institute of Medicine). 2010. Dietary reference    intakes for calcium and D. [place]: The    [employer]. 2. Holick MF, [place], Bischoff-Ferrari HA, et al.    Evaluation, treatment, and prevention of vitamin D    deficiency: an Endocrine Society clinical practice    guideline. JCEM. 2011 Jul; 96(7):1911-30.  Phosphorus     Status: None   Collection Time: 03/17/20  9:35 AM  Result Value Ref Range   Phosphorus 3.2 2.1 - 4.3 mg/dL  Magnesium     Status: None   Collection Time: 03/17/20  9:35 AM  Result Value Ref Range   Magnesium 1.6 1.5 - 2.5 mg/dL  PTH, intact and calcium     Status: Abnormal   Collection Time: 03/17/20  9:35 AM  Result Value Ref Range   PTH 91 (H) 14 - 64 pg/mL    Comment: . Interpretive Guide    Intact PTH           Calcium ------------------    ----------           ------- Normal Parathyroid    Normal               Normal Hypoparathyroidism    Low or Low Normal    Low Hyperparathyroidism    Primary            Normal or High       High    Secondary          High                 Normal or Low    Tertiary           High                 High Non-Parathyroid    Hypercalcemia      Low or Low Normal    High .    Calcium 10.0 8.6 - 10.4 mg/dL  TSH     Status: None   Collection Time: 03/17/20  9:35 AM  Result  Value Ref Range   TSH 2.53 0.40 - 4.50 mIU/L  T4, free     Status: None   Collection Time: 03/17/20  9:35 AM  Result Value Ref Range   Free T4 1.3 0.8 - 1.8 ng/dL  Calcium, urine, 24 hour     Status: None   Collection Time: 03/22/20  2:15 PM  Result Value Ref Range   Calcium, 24H Urine 80 mg/24 h    Comment:                           Reference Range  35-250                            Low calcium diet 35-200   Creatinine, urine, 24 hour     Status: None   Collection Time: 03/22/20  2:15 PM  Result Value Ref Range   Creatinine, 24H Ur 0.77 0.50 - 2.15 g/24 h    Assessment: 1. Hypercalcemia 2.  Vitamin D deficiency 3.  Osteopenia  Plan: -Her repeat labs, remains off of Tums, indicate normal calcium of 10 mg per DL, with elevated PTH of 91.  More importantly, her 24-hour urine calcium is not elevated at 80.   Patient has had chronic mild hypercalcemia.  This may still be primary hyperparathyroidism versus medications related.     -She does have osteopenia, she did have vitamin D deficiency, currently is vitamin D replete-she is advised to continue on regular vitamin D supplement.    -She will not need surgical or medication interventions at this time. -She will need repeat bone density in 2 years.  -Her labs rule out hyperthyroidism or any  etiology. -She will have repeat PTH/calcium and vitamin D levels measured in 6 months with office visit. If she is confirmed to have primary hyperparathyroidism, she is a surgical candidate.  She is advised to continue close follow-up with her PMD Dr. Baltazar Apo.     - Time spent on this patient care encounter:  20 minutes of which 50% was spent in  counseling and the rest reviewing  her current and  previous labs / studies and medications  doses and developing a plan for long term care. Josephina Shih  participated in the discussions, expressed understanding, and voiced agreement with the above plans.  All questions were answered to her  satisfaction. she is encouraged to contact clinic should she have any questions or concerns prior to her return visit.   - Return in about 6 months (around 10/02/2020) for F/U with Pre-visit Labs.   Glade Lloyd, MD [facility] [address] [place], [place] [address] Phone: [phone]  Fax: [phone]    This note was partially dictated with voice recognition software. Similar sounding words can be transcribed inadequately or may not  be corrected upon review.  04/01/2020, 12:54 PM

## 2020-04-06 ENCOUNTER — Other Ambulatory Visit: Payer: Self-pay

## 2020-04-06 ENCOUNTER — Ambulatory Visit (INDEPENDENT_AMBULATORY_CARE_PROVIDER_SITE_OTHER): Payer: PPO | Admitting: Family Medicine

## 2020-04-06 ENCOUNTER — Encounter (INDEPENDENT_AMBULATORY_CARE_PROVIDER_SITE_OTHER): Payer: Self-pay | Admitting: Family Medicine

## 2020-04-06 DIAGNOSIS — Z6838 Body mass index (BMI) 38.0-38.9, adult: Secondary | ICD-10-CM

## 2020-04-06 DIAGNOSIS — I10 Essential (primary) hypertension: Secondary | ICD-10-CM

## 2020-04-06 NOTE — Progress Notes (Signed)
Chief Complaint:   OBESITY [name] is here to discuss her progress with her obesity treatment plan along with follow-up of her obesity related diagnoses. [name] is on keeping a food journal and adhering to recommended goals of 1200 calories and 90+ grams of protein daily and states she is following her eating plan approximately 90% of the time. [name] states she is doing more dancing and moving.   Today's visit was #: 22 Starting weight: 267 lbs Starting date: 10/31/2018 Today's weight: 219 lbs Today's date: 04/06/2020 Total lbs lost to date: 48 Total lbs lost since last in-office visit: 1  Interim History: [name] voices in the next month she has plans to meet up with her daughter at [place], drive up to [place] to see her son, and her other son is driving down from [place]. She is wondering about if she will ever get to decrease protein amount and keep journaling in her head for the next few weeks. She can do there activity as tolerated.  Subjective:   1. Hypercalcemia [name] saw Dr. Dorris Fetch and is likely hyperparathyroidism. Her labs are to be repeated in 6 month (if she still has abnormality, may be a surgical candidate).  2. Essential hypertension [name]'s blood pressure is well controlled. She denies chest pain, chest pressure, or headache. She is on lisinopril 5 mg.  Assessment/Plan:   1. Hypercalcemia [name] will follow up with Dr. Dorris Fetch in 6 months, and will continue Vit D.  2. Essential hypertension [name] will continue lisinopril, no change in dose, and will continue working on healthy weight loss and exercise to improve blood pressure control. We will watch for signs of hypotension as she continues her lifestyle modifications.  3. Class 2 severe obesity with serious comorbidity and body mass index (BMI) of 38.0 to 38.9 in adult, unspecified obesity type [facility]) [name] is currently in the action stage of change. As such, her goal is to continue with weight loss  efforts. She has agreed to keeping a food journal and adhering to recommended goals of 1200 calories and 90+ grams of protein daily.   Exercise goals: All adults should avoid inactivity. Some physical activity is better than none, and adults who participate in any amount of physical activity gain some health benefits.  Behavioral modification strategies: increasing lean protein intake, meal planning and cooking strategies, keeping healthy foods in the home and planning for success.  [name] has agreed to follow-up with our clinic in 4 weeks. She was informed of the importance of frequent follow-up visits to maximize her success with intensive lifestyle modifications for her multiple health conditions.   Objective:   Blood pressure 114/81, pulse 63, temperature 98 F (36.7 C), temperature source Oral, height 5\' 3"  (1.6 m), weight 219 lb (99.3 kg), SpO2 98 %. Body mass index is 38.79 kg/m.  General: Cooperative, alert, well developed, in no acute distress. HEENT: Conjunctivae and lids unremarkable. Cardiovascular: Regular rhythm.  Lungs: Normal work of breathing. Neurologic: No focal deficits.   Lab Results  Component Value Date   CREATININE 0.95 02/02/2020   BUN 21 02/02/2020   NA 142 02/02/2020   K 5.4 (H) 02/02/2020   CL 108 02/02/2020   CO2 27 02/02/2020   Lab Results  Component Value Date   ALT 18 02/02/2020   AST 20 02/02/2020   ALKPHOS 70 11/28/2019   BILITOT 0.6 02/02/2020   Lab Results  Component Value Date   HGBA1C 5.4 02/27/2019   HGBA1C 5.5 10/31/2018   Lab  Results  Component Value Date   INSULIN 8.3 02/27/2019   INSULIN 12.3 10/31/2018   Lab Results  Component Value Date   TSH 2.53 03/17/2020   Lab Results  Component Value Date   CHOL 207 (H) 01/20/2020   HDL 57 01/20/2020   LDLCALC 142 (H) 01/20/2020   TRIG 47 01/20/2020   CHOLHDL 3.8 11/06/2017   Lab Results  Component Value Date   WBC 4.5 02/02/2020   HGB 14.3 02/02/2020   HCT 45.0  02/02/2020   MCV 90.7 02/02/2020   PLT 196 02/02/2020   No results found for: IRON, TIBC, FERRITIN  Attestation Statements:   Reviewed by clinician on day of visit: allergies, medications, problem list, medical history, surgical history, family history, social history, and previous encounter notes.  Time spent on visit including pre-visit chart review and post-visit care and charting was 16 minutes.    I, Trixie Dredge, am acting as transcriptionist for Coralie Common, MD.  I have reviewed the above documentation for accuracy and completeness, and I agree with the above. - Jinny Blossom, MD

## 2020-04-09 ENCOUNTER — Ambulatory Visit: Payer: PPO | Admitting: Family Medicine

## 2020-04-13 ENCOUNTER — Ambulatory Visit (HOSPITAL_COMMUNITY): Payer: PPO | Admitting: Psychiatry

## 2020-04-13 ENCOUNTER — Other Ambulatory Visit: Payer: Self-pay

## 2020-04-16 ENCOUNTER — Other Ambulatory Visit: Payer: Self-pay | Admitting: Nurse Practitioner

## 2020-04-27 ENCOUNTER — Ambulatory Visit (INDEPENDENT_AMBULATORY_CARE_PROVIDER_SITE_OTHER): Payer: PPO | Admitting: Psychiatry

## 2020-04-27 ENCOUNTER — Other Ambulatory Visit: Payer: Self-pay

## 2020-04-27 DIAGNOSIS — F330 Major depressive disorder, recurrent, mild: Secondary | ICD-10-CM
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
## 2020-04-29 ENCOUNTER — Ambulatory Visit: Payer: PPO | Admitting: Family Medicine

## 2020-05-02 ENCOUNTER — Other Ambulatory Visit: Payer: Self-pay | Admitting: Rheumatology

## 2020-05-02 DIAGNOSIS — M0579 Rheumatoid arthritis with rheumatoid factor of multiple sites without organ or systems involvement: Secondary | ICD-10-CM

## 2020-05-03 NOTE — Telephone Encounter (Signed)
Last Visit: 02/02/2020 Next Visit: due July 2021. Message sent to the front to schedule.  Labs: 02/02/2020 CBC showed low MCHC and elevated RDW. CMP showed slightly elevated potassium and elevated calcium which is chronic Eye exam: 12/03/2019 WNL  Current Dose per office note 02/02/2020:  PLQ 200 mg 1 tablet by mouth twice daily DX: Seropositive rheumatoid arthritis of multiple sites   Okay to refill Plaquenil?

## 2020-05-06 ENCOUNTER — Encounter: Payer: Self-pay | Admitting: Rheumatology

## 2020-05-06 ENCOUNTER — Other Ambulatory Visit: Payer: Self-pay | Admitting: *Deleted

## 2020-05-06 DIAGNOSIS — E7849 Other hyperlipidemia: Secondary | ICD-10-CM

## 2020-05-06 DIAGNOSIS — Z79899 Other long term (current) drug therapy: Secondary | ICD-10-CM

## 2020-05-11 ENCOUNTER — Ambulatory Visit (INDEPENDENT_AMBULATORY_CARE_PROVIDER_SITE_OTHER): Payer: PPO | Admitting: Psychiatry

## 2020-05-11 ENCOUNTER — Other Ambulatory Visit: Payer: Self-pay

## 2020-05-11 ENCOUNTER — Encounter (HOSPITAL_COMMUNITY): Payer: Self-pay | Admitting: Psychiatry

## 2020-05-11 DIAGNOSIS — F330 Major depressive disorder, recurrent, mild: Secondary | ICD-10-CM

## 2020-05-11 DIAGNOSIS — F411 Generalized anxiety disorder: Secondary | ICD-10-CM
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
## 2020-05-13 ENCOUNTER — Ambulatory Visit (INDEPENDENT_AMBULATORY_CARE_PROVIDER_SITE_OTHER): Payer: PPO | Admitting: Family Medicine

## 2020-05-13 ENCOUNTER — Other Ambulatory Visit: Payer: Self-pay

## 2020-05-13 ENCOUNTER — Encounter (INDEPENDENT_AMBULATORY_CARE_PROVIDER_SITE_OTHER): Payer: Self-pay | Admitting: Family Medicine

## 2020-05-13 VITALS — BP 102/70 | HR 69 | Temp 98.1°F | Ht 63.0 in | Wt 215.0 lb

## 2020-05-13 DIAGNOSIS — I10 Essential (primary) hypertension: Secondary | ICD-10-CM | POA: Diagnosis not present

## 2020-05-13 DIAGNOSIS — E7849 Other hyperlipidemia: Secondary | ICD-10-CM | POA: Diagnosis not present

## 2020-05-13 DIAGNOSIS — Z6838 Body mass index (BMI) 38.0-38.9, adult: Secondary | ICD-10-CM

## 2020-05-13 MED ORDER — ATORVASTATIN CALCIUM 80 MG PO TABS: 40.0000 mg | ORAL_TABLET | ORAL | 0 refills | Status: DC

## 2020-05-14 DIAGNOSIS — Z79899 Other long term (current) drug therapy: Secondary | ICD-10-CM | POA: Diagnosis not present

## 2020-05-14 DIAGNOSIS — E7849 Other hyperlipidemia: Secondary | ICD-10-CM | POA: Diagnosis not present

## 2020-05-14 NOTE — Progress Notes (Signed)
Chief Complaint:   OBESITY [name] is here to discuss her progress with her obesity treatment plan along with follow-up of her obesity related diagnoses. [name] is on keeping a food journal and adhering to recommended goals of 1200 calories and 90+ grams of protein daily and states she is following her eating plan approximately 75% of the time. [name] states she is walking and cleaning for 2.5 hours 1 time per week.  Today's visit was #: 23 Starting weight: 267 lbs Starting date: 10/31/2018 Today's weight: 215 lbs Today's date: 05/13/2020 Total lbs lost to date: 52 Total lbs lost since last in-office visit: 4  Interim History: [name] went to dinner at [name]'s the last week. She is following the plan around 75% of the time, and ranging calories and protein wise. She knows she hasn't been as consistent as she was. She has reached her weight loss goal. Her son is coming for a visit in the upcoming weeks. She does voice this may lead to some indulgent eating.  Subjective:   1. Other hyperlipidemia Reather is on Lipitor with some occasional myalgias but not attributed to statin.  2. Essential hypertension [name]'s blood pressure is well controlled today. She denies chest pain, chest pressure, or headache. She is on lisinopril (labs previously well controlled).  Assessment/Plan:   1. Other hyperlipidemia Cardiovascular risk and specific lipid/LDL goals reviewed. We discussed several lifestyle modifications today. Kristene will continue to work on diet, exercise and weight loss efforts. We will refill atorvastatin 80 mg (alternating between 40 and 80 mg every other day), with no refill. Orders and follow up as documented in patient record.   Counseling Intensive lifestyle modifications are the first line treatment for this issue. . Dietary changes: Increase soluble fiber. Decrease simple carbohydrates. . Exercise changes: Moderate to vigorous-intensity aerobic activity 150 minutes per  week if tolerated. . Lipid-lowering medications: see documented in medical record.  - atorvastatin (LIPITOR) 80 MG tablet; Take 0.5-1 tablets (40-80 mg total) by mouth See admin instructions. Alternating between 40 mg & 80 mg by mouth every other day in the morning  Dispense: 30 tablet; Refill: 0  2. Essential hypertension [name] is working on healthy weight loss and exercise to improve blood pressure control. We will watch for signs of hypotension as she continues her lifestyle modifications. [name] will discontinue lisinopril, and we will follow up on her blood pressure at her next upcoming appointment.  3. Class 2 severe obesity with serious comorbidity and body mass index (BMI) of 38.0 to 38.9 in adult, unspecified obesity type [facility]) [name] is currently in the action stage of change. As such, her goal is to continue with weight loss efforts. She has agreed to keeping a food journal and adhering to recommended goals of 1200 calories and 90+ grams of protein.   Exercise goals: As is.  Behavioral modification strategies: increasing lean protein intake, meal planning and cooking strategies, keeping healthy foods in the home and keeping a strict food journal.  [name] has agreed to follow-up with our clinic in 4 to 5 weeks. She was informed of the importance of frequent follow-up visits to maximize her success with intensive lifestyle modifications for her multiple health conditions.   Objective:   Blood pressure 102/70, pulse 69, temperature 98.1 F (36.7 C), temperature source Oral, height 5\' 3"  (1.6 m), weight 215 lb (97.5 kg), SpO2 95 %. Body mass index is 38.09 kg/m.  General: Cooperative, alert, well developed, in no acute distress. HEENT: Conjunctivae and lids  unremarkable. Cardiovascular: Regular rhythm.  Lungs: Normal work of breathing. Neurologic: No focal deficits.   Lab Results  Component Value Date   CREATININE 0.95 02/02/2020   BUN 21 02/02/2020   NA 142 02/02/2020     K 5.4 (H) 02/02/2020   CL 108 02/02/2020   CO2 27 02/02/2020   Lab Results  Component Value Date   ALT 18 02/02/2020   AST 20 02/02/2020   ALKPHOS 70 11/28/2019   BILITOT 0.6 02/02/2020   Lab Results  Component Value Date   HGBA1C 5.4 02/27/2019   HGBA1C 5.5 10/31/2018   Lab Results  Component Value Date   INSULIN 8.3 02/27/2019   INSULIN 12.3 10/31/2018   Lab Results  Component Value Date   TSH 2.53 03/17/2020   Lab Results  Component Value Date   CHOL 207 (H) 01/20/2020   HDL 57 01/20/2020   LDLCALC 142 (H) 01/20/2020   TRIG 47 01/20/2020   CHOLHDL 3.8 11/06/2017   Lab Results  Component Value Date   WBC 4.5 02/02/2020   HGB 14.3 02/02/2020   HCT 45.0 02/02/2020   MCV 90.7 02/02/2020   PLT 196 02/02/2020   No results found for: IRON, TIBC, FERRITIN  Obesity Behavioral Intervention:   Approximately 15 minutes were spent on the discussion below.  ASK: We discussed the diagnosis of obesity with [name] today and [name] agreed to give [place] permission to discuss obesity behavioral modification therapy today.  ASSESS: [name] has the diagnosis of obesity and her BMI today is 38.09. [name] is in the action stage of change.   ADVISE: [name] was educated on the multiple health risks of obesity as well as the benefit of weight loss to improve her health. She was advised of the need for long term treatment and the importance of lifestyle modifications to improve her current health and to decrease her risk of future health problems.  AGREE: Multiple dietary modification options and treatment options were discussed and [name] agreed to follow the recommendations documented in the above note.  ARRANGE: [name] was educated on the importance of frequent visits to treat obesity as outlined per CMS and USPSTF guidelines and agreed to schedule her next follow up appointment today.  Attestation Statements:   Reviewed by clinician on day of visit: allergies,  medications, problem list, medical history, surgical history, family history, social history, and previous encounter notes.   I, Trixie Dredge, am acting as transcriptionist for Coralie Common, MD. I have reviewed the above documentation for accuracy and completeness, and I agree with the above. - Jinny Blossom, MD

## 2020-05-15 LAB — LIPID PANEL
Chol/HDL Ratio: 4.6 ratio — ABNORMAL HIGH (ref 0.0–4.4)
Cholesterol, Total: 288 mg/dL — ABNORMAL HIGH (ref 100–199)
HDL: 62 mg/dL (ref >39)
LDL Chol Calc (NIH): 220 mg/dL — ABNORMAL HIGH (ref 0–99)
Triglycerides: 48 mg/dL (ref 0–149)
VLDL Cholesterol Cal: 6 mg/dL (ref 5–40)

## 2020-05-15 LAB — CBC WITH DIFFERENTIAL/PLATELET
Basophils Absolute: 0 10*3/uL (ref 0.0–0.2)
Basos: 0 %
EOS (ABSOLUTE): 0.1 10*3/uL (ref 0.0–0.4)
Eos: 2 %
Hematocrit: 43.2 % (ref 34.0–46.6)
Hemoglobin: 13.8 g/dL (ref 11.1–15.9)
Immature Grans (Abs): 0 10*3/uL (ref 0.0–0.1)
Immature Granulocytes: 0 %
Lymphocytes Absolute: 1.8 10*3/uL (ref 0.7–3.1)
Lymphs: 39 %
MCH: 28.8 pg (ref 26.6–33.0)
MCHC: 31.9 g/dL (ref 31.5–35.7)
MCV: 90 fL (ref 79–97)
Monocytes Absolute: 0.4 10*3/uL (ref 0.1–0.9)
Monocytes: 9 %
Neutrophils Absolute: 2.2 10*3/uL (ref 1.4–7.0)
Neutrophils: 50 %
Platelets: 190 10*3/uL (ref 150–450)
RBC: 4.79 x10E6/uL (ref 3.77–5.28)
RDW: 14.4 % (ref 11.7–15.4)
WBC: 4.5 10*3/uL (ref 3.4–10.8)

## 2020-05-15 LAB — CMP14+EGFR
ALT: 11 IU/L (ref 0–32)
AST: 17 IU/L (ref 0–40)
Albumin/Globulin Ratio: 1.8 (ref 1.2–2.2)
Albumin: 4.4 g/dL (ref 3.8–4.8)
Alkaline Phosphatase: 63 IU/L (ref 44–121)
BUN/Creatinine Ratio: 22 (ref 12–28)
BUN: 19 mg/dL (ref 8–27)
Bilirubin Total: 0.4 mg/dL (ref 0.0–1.2)
CO2: 23 mmol/L (ref 20–29)
Calcium: 10.1 mg/dL (ref 8.7–10.3)
Chloride: 100 mmol/L (ref 96–106)
Creatinine, Ser: 0.85 mg/dL (ref 0.57–1.00)
GFR calc Af Amer: 81 mL/min/{1.73_m2} (ref >59)
GFR calc non Af Amer: 70 mL/min/{1.73_m2} (ref >59)
Globulin, Total: 2.4 g/dL (ref 1.5–4.5)
Glucose: 94 mg/dL (ref 65–99)
Potassium: 4.1 mmol/L (ref 3.5–5.2)
Sodium: 138 mmol/L (ref 134–144)
Total Protein: 6.8 g/dL (ref 6.0–8.5)

## 2020-05-16 NOTE — Telephone Encounter (Signed)
CBC and CMP are normal.She has not been seen since start of Enbrel in July. Please sch a FU visit with me any day within next two weeks.[name]

## 2020-05-17 ENCOUNTER — Telehealth: Payer: Self-pay

## 2020-05-17 NOTE — Telephone Encounter (Signed)
I did not call patient.  Believe she was calling for lab results.

## 2020-05-17 NOTE — Telephone Encounter (Signed)
Patient left a voicemail stating she was returning Amber's call.

## 2020-05-17 NOTE — Telephone Encounter (Signed)
Spoke with patient and scheduled her for a follow up appointment on 05/19/2020 at 1:15 pm.

## 2020-05-18 NOTE — Progress Notes (Signed)
Office Visit Note  Patient: [name]             Date of Birth: 10/22/1950           MRN: [id number]             PCP: Erven Colla, DO Referring: Erven Colla, DO Visit Date: 05/19/2020 Occupation: @GUAROCC @  Subjective:  Medication Management (Enbrel follow-up)   History of Present Illness: [name] is a 69 y.o. female with history of rheumatoid arthritis, osteoarthritis and degenerative disc disease.  She was a started on Enbrel in July.  She has been tolerating Enbrel well.  She has noticed decrease of stiffness and decrease in fatigue.  She denies any joint swelling.  She continues to have some discomfort in her shoulder joint which is chronic.  Her bilateral knee replacement is doing well.  She continues to have some lower back pain.  She has intermittent right trochanteric bursitis.  She states she has had burning sensation in her feet for last several years.  She continues to have that and also has been experiencing some burning sensation in her hands lately.  She has appointment coming up with her PCP.  Activities of Daily Living:  Patient reports morning stiffness for 40 minutes.   Patient Reports nocturnal pain.  Difficulty dressing/grooming: Denies Difficulty climbing stairs: Denies Difficulty getting out of chair: Denies Difficulty using hands for taps, buttons, cutlery, and/or writing: Reports  Review of Systems  Constitutional: Negative for fatigue.  HENT: Positive for mouth dryness and nose dryness. Negative for mouth sores.   Eyes: Positive for dryness. Negative for pain and visual disturbance.  Respiratory: Positive for cough. Negative for hemoptysis, shortness of breath and difficulty breathing.        Recently developed a mild cough due to mouth dryness  Cardiovascular: Negative for chest pain, palpitations and swelling in legs/feet.  Gastrointestinal: Negative for abdominal pain, blood in stool, constipation and diarrhea.  Endocrine:  Negative for increased urination.  Genitourinary: Negative for painful urination.  Musculoskeletal: Positive for arthralgias, joint pain, joint swelling and morning stiffness. Negative for myalgias, muscle weakness, muscle tenderness and myalgias.  Skin: Negative for color change, rash and redness.  Allergic/Immunologic: Negative for susceptible to infections.  Neurological: Positive for headaches. Negative for dizziness, numbness, memory loss and weakness.  Hematological: Negative for swollen glands.  Psychiatric/Behavioral: Positive for depressed mood. Negative for confusion and sleep disturbance. The patient is nervous/anxious.     PMFS History:  Patient Active Problem List   Diagnosis Date Noted  . Vitamin D deficiency 07/29/2019  . Depression with anxiety 06/19/2019  . History of total knee replacement, bilateral 10/30/2018  . History of MRSA infection 04/04/2017  . High risk medication use 03/02/2017  . Hypercalcemia 03/02/2017  . Pelvic pain in female 08/25/2015  . Hematuria 08/25/2015  . History of adenomatous polyp of colon 10/01/2014  . Seropositive rheumatoid arthritis of multiple sites ([place]) 07/20/2014  . History of juvenile rheumatoid arthritis 07/20/2014  . Impaired fasting glucose 07/20/2014  . NEOPLASM, SKIN, UNCERTAIN BEHAVIOR 16/02/3709  . Unilateral primary osteoarthritis, right knee 09/23/2008  . CALF PAIN, RIGHT 09/23/2008  . ANXIETY DEPRESSION 08/07/2008  . OBESITY 12/19/2007  . DDD (degenerative disc disease), lumbar 06/27/2007  . Osteopenia of both forearms 06/27/2007  . HIP PAIN, LEFT 06/18/2007  . CONSTIPATION 05/16/2007  . Essential hypertension 04/04/2007  . Psoriasis 03/07/2007  . OSTEOARTHROSIS, GENERALIZED, MULTIPLE SITES 03/07/2007  . ANOSMIA 03/07/2007  . LIPOMA  02/21/2007  . Hyperlipidemia LDL goal <130 02/21/2007  . ALLERGIC RHINITIS 02/21/2007  . ASTHMA 02/21/2007  . ARTHRITIS 02/21/2007  . KNEE PAIN, LEFT 02/21/2007  . LOW BACK PAIN,  CHRONIC 02/21/2007    Past Medical History:  Diagnosis Date  . Allergic rhinitis   . Anxiety   . Arthritis   . Asthma   . Constipation   . Depression   . Gallbladder problem   . Hematuria 08/25/2015  . Hyperlipidemia   . Hypertension   . Obesity   . Osteoarthritis   . Pelvic pain in female 08/25/2015  . Rheumatoid arthritis ([place])   . Swallowing difficulty   . Swelling     Family History  Problem Relation Age of Onset  . Heart attack Father   . Alcoholism Father   . Depression Father   . Heart disease Father   . Hyperlipidemia Father   . Hypertension Father   . Leukemia Mother   . Hypertension Mother   . Cancer Mother   . Depression Mother   . Obesity Sister   . Other Sister        breathing problems  . Hepatitis C Sister   . Arthritis Sister   . Depression Sister   . Colon cancer Sister   . Emphysema Brother   . Obesity Daughter   . Polycystic ovary syndrome Daughter   . Obesity Son   . Other Son        knee problems  . Cancer Maternal Grandmother        breast  . Other Brother        aneursym  . Arthritis Sister   . Depression Sister   . Obesity Son   . Asthma Son   . Obesity Son   . Diabetes Other        runs on dad's side of the family   Past Surgical History:  Procedure Laterality Date  . BACK SURGERY    . CHOLECYSTECTOMY    . COLONOSCOPY  2007   sessile sigmoid polyp x 1; path: serrated adenoma  . COLONOSCOPY N/A 10/12/2014   Procedure: COLONOSCOPY;  Surgeon: [name], MD;  Location: [facility];  Service: Endoscopy;  Laterality: N/A;  . COLONOSCOPY N/A 03/05/2020   Procedure: COLONOSCOPY;  Surgeon: Daneil Dolin, MD;  Location: [facility];  Service: Endoscopy;  Laterality: N/A;  9:30  . JOINT REPLACEMENT     BIL knee   . KNEE ARTHROPLASTY    . KNEE SURGERY     bilateral knee replacement  . LIPOMA EXCISION     x 2  . TONSILLECTOMY AND ADENOIDECTOMY     Social History   Social History Narrative  . Not on file   Immunization  History  Administered Date(s) Administered  . Influenza Split 06/17/2013  . Influenza Whole 06/27/2007, 05/08/2008  . Influenza, High Dose Seasonal PF 06/14/2017, 06/18/2018  . Influenza,inj,Quad PF,6+ Mos 07/12/2015  . Influenza-Unspecified 06/14/2017, 06/18/2018, 05/11/2019  . PFIZER SARS-COV-2 Vaccination 10/12/2019, 11/04/2019  . Pneumococcal Polysaccharide-23 02/18/1993, 06/27/2007  . Zoster Recombinat (Shingrix) 08/19/2018     Objective: Vital Signs: BP 128/76 (BP Location: Left Arm, Patient Position: Sitting, Cuff Size: Small)   Pulse 79   Ht 5\' 3"  (1.6 m)   Wt 222 lb (100.7 kg)   BMI 39.33 kg/m    Physical Exam Vitals and nursing note reviewed.  Constitutional:      Appearance: She is well-developed.  HENT:     Head:  Normocephalic and atraumatic.  Eyes:     Conjunctiva/sclera: Conjunctivae normal.  Cardiovascular:     Rate and Rhythm: Normal rate and regular rhythm.     Heart sounds: Normal heart sounds.  Pulmonary:     Effort: Pulmonary effort is normal.     Breath sounds: Normal breath sounds.  Abdominal:     General: Bowel sounds are normal.     Palpations: Abdomen is soft.  Musculoskeletal:     Cervical back: Normal range of motion.  Lymphadenopathy:     Cervical: No cervical adenopathy.  Skin:    General: Skin is warm and dry.     Capillary Refill: Capillary refill takes less than 2 seconds.  Neurological:     Mental Status: She is alert and oriented to person, place, and time.  Psychiatric:        Behavior: Behavior normal.      Musculoskeletal Exam: C-spine was in good range of motion.  Shoulder joints were in good range of motion with minimal discomfort.  Elbow joints and wrist joints were in good range of motion with no synovitis.  She has thickening of her bilateral MCP joints but no synovitis.  Mild PIP and DIP thickening was noted.  Hip joints, knee joints, ankles with good range of motion.  She had no tenderness over ankles or MTPs.  CDAI  Exam: CDAI Score: 2.6  Patient Global: 3 mm; Provider Global: 3 mm Swollen: 0 ; Tender: 2  Joint Exam 05/19/2020      Right  Left  Glenohumeral   Tender   Tender     Investigation: No additional findings.  Imaging: No results found.  Recent Labs: Lab Results  Component Value Date   WBC 4.5 05/14/2020   HGB 13.8 05/14/2020   PLT 190 05/14/2020   NA 138 05/14/2020   K 4.1 05/14/2020   CL 100 05/14/2020   CO2 23 05/14/2020   GLUCOSE 94 05/14/2020   BUN 19 05/14/2020   CREATININE 0.85 05/14/2020   BILITOT 0.4 05/14/2020   ALKPHOS 63 05/14/2020   AST 17 05/14/2020   ALT 11 05/14/2020   PROT 6.8 05/14/2020   ALBUMIN 4.4 05/14/2020   CALCIUM 10.1 05/14/2020   GFRAA 81 05/14/2020   QFTBGOLDPLUS NEGATIVE 02/02/2020    Speciality Comments: PLQ Eye Exam: 12/03/2019 WNL @ [facility] Follow up in 1 year MTX -dcd 09/19 Enbrel start date 03/10/20  Procedures:  No procedures performed Allergies: Codeine, Cyclosporine, and Propylene glycol   Assessment / Plan:     Visit Diagnoses: Seropositive rheumatoid arthritis of multiple sites ([place]) - positive RF, positive anti-CCP. Previous patient of Dr. Charlestine Night with multiple contractures, ultrasound of both hands on 10/30/18 revealed synovitis.  She is doing much better on Enbrel.  She is on Enbrel and Plaquenil combination.  I discussed the option of stopping Plaquenil if she wants to.  She had no synovitis on my examination.  Side effects of Enbrel were reviewed.  High risk medication use - enbrel mini, PLQ 200 mg 1 tablet by mouth twice daily. PLQ Eye Exam: 12/03/2019 (MTX -dcd 09/19) her labs are up to date.  She will need labs in 3 months to monitor for drug toxicity.  Left findings were discussed.  History of total knee replacement, bilateral-she is currently not having any discomfort.  DDD (degenerative disc disease), lumbar - s/p fusion.  She has intermittent lower back pain.  Trochanteric bursitis of right hip-she has  intermittent trochanteric bursitis.  Stretching exercises  were emphasized.  Osteopenia of multiple sites - Jan 12, 2020 DEXA scan showed T score of -1.2.  I reviewed her DEXA report.  Use of calcium, vitamin D and resistive exercises were discussed.  Other medical problems are listed as follows:  History of MRSA infection-I have advised her to monitor closely for infection.  If she develops an infection she should stop Enbrel.  She can restart the medication and the infection resolves.  History of anxiety  History of depression  History of asthma  History of hyperlipidemia  Essential hypertension  History of adenomatous polyp of colon  Educated about COVID-19 virus infection-she is fully vaccinated against COVID-19.  Use of booster was discussed.  Use of mask, social distancing and hand hygiene was discussed.  Use of monoclonal antibodies were discussed in case she develops an infection.  Instructions placed in the AVS.  Orders: No orders of the defined types were placed in this encounter.  No orders of the defined types were placed in this encounter.     Follow-Up Instructions: Return in about 3 months (around 08/18/2020) for Rheumatoid arthritis, Osteoarthritis.   [name], MD  Note - This record has been created using Editor, commissioning.  Chart creation errors have been sought, but may not always  have been located. Such creation errors do not reflect on  the standard of medical care.

## 2020-05-19 ENCOUNTER — Other Ambulatory Visit: Payer: Self-pay

## 2020-05-19 ENCOUNTER — Ambulatory Visit: Payer: PPO | Admitting: Rheumatology

## 2020-05-19 ENCOUNTER — Encounter: Payer: Self-pay | Admitting: Rheumatology

## 2020-05-19 VITALS — BP 128/76 | HR 79 | Ht 63.0 in | Wt 222.0 lb

## 2020-05-19 DIAGNOSIS — Z7189 Other specified counseling: Secondary | ICD-10-CM

## 2020-05-19 DIAGNOSIS — M7061 Trochanteric bursitis, right hip: Secondary | ICD-10-CM

## 2020-05-19 DIAGNOSIS — Z96653 Presence of artificial knee joint, bilateral: Secondary | ICD-10-CM

## 2020-05-19 DIAGNOSIS — M8589 Other specified disorders of bone density and structure, multiple sites: Secondary | ICD-10-CM | POA: Diagnosis not present

## 2020-05-19 DIAGNOSIS — Z8639 Personal history of other endocrine, nutritional and metabolic disease: Secondary | ICD-10-CM | POA: Diagnosis not present

## 2020-05-19 DIAGNOSIS — Z79899 Other long term (current) drug therapy: Secondary | ICD-10-CM | POA: Diagnosis not present

## 2020-05-19 DIAGNOSIS — Z8709 Personal history of other diseases of the respiratory system: Secondary | ICD-10-CM

## 2020-05-19 DIAGNOSIS — Z8614 Personal history of Methicillin resistant Staphylococcus aureus infection: Secondary | ICD-10-CM

## 2020-05-19 DIAGNOSIS — M0579 Rheumatoid arthritis with rheumatoid factor of multiple sites without organ or systems involvement: Secondary | ICD-10-CM

## 2020-05-19 DIAGNOSIS — I10 Essential (primary) hypertension: Secondary | ICD-10-CM

## 2020-05-19 DIAGNOSIS — Z86010 Personal history of colonic polyps: Secondary | ICD-10-CM | POA: Diagnosis not present

## 2020-05-19 DIAGNOSIS — M5136 Other intervertebral disc degeneration, lumbar region: Secondary | ICD-10-CM | POA: Diagnosis not present

## 2020-05-19 DIAGNOSIS — Z8659 Personal history of other mental and behavioral disorders: Secondary | ICD-10-CM

## 2020-05-19 NOTE — Patient Instructions (Addendum)
Standing Labs We placed an order today for your standing lab work.   Please have your standing labs drawn in December and every 3 months  If possible, please have your labs drawn 2 weeks prior to your appointment so that the provider can discuss your results at your appointment.  We have open lab daily Monday through Thursday from 8:30-12:30 PM and 1:30-4:30 PM and Friday from 8:30-12:30 PM and 1:30-4:00 PM at the office of Dr. [name], [facility].   Please be advised, patients with office appointments requiring lab work will take precedents over walk-in lab work.  If possible, please come for your lab work on Monday and Friday afternoons, as you may experience shorter wait times. The office is located at [address], [place], [place], [place] [address] No appointment is necessary.   Labs are drawn by [facility]. Please bring your co-pay at the time of your lab draw.  You may receive a bill from [facility] for your lab work.  If you wish to have your labs drawn at another location, please call the office 24 hours in advance to send orders.  If you have any questions regarding directions or hours of operation,  please call [phone].   As a reminder, please drink plenty of water prior to coming for your lab work. Thanks!  COVID-19 vaccine recommendations:   COVID-19 vaccine is recommended for everyone (unless you are allergic to a vaccine component), even if you are on a medication that suppresses your immune system.   Do not take Tylenol or any anti-inflammatory medications (NSAIDs) 24 hours prior to the COVID-19 vaccination.   There is no direct evidence about the efficacy of the COVID-19 vaccine in individuals who are on medications that suppress the immune system.   Even if you are fully vaccinated, and you are on any medications that suppress your immune system, please continue to wear a mask, maintain at least six feet social distance and practice hand hygiene.    If you develop a COVID-19 infection, please contact your PCP or our office to determine if you need antibody infusion.  The booster vaccine is now available for immunocompromised patients. It is advised that if you had Pfizer vaccine you should get [employer] booster.  If you had a Moderna vaccine then you should get a Moderna booster. Johnson and Wynetta Emery does not have a booster vaccine at this time.  Please see the following web sites for updated information.   [email or web address]://[email or web address]/[email or web address]/Files/[email or web address]-19-Vaccination-Patient-Resources.pdf  [email or web address]://[email or web address]/About-Us/Newsroom/Press-Releases/ID/1159

## 2020-05-25 ENCOUNTER — Other Ambulatory Visit: Payer: Self-pay

## 2020-05-25 ENCOUNTER — Ambulatory Visit (INDEPENDENT_AMBULATORY_CARE_PROVIDER_SITE_OTHER): Payer: PPO | Admitting: Psychiatry

## 2020-05-25 DIAGNOSIS — F330 Major depressive disorder, recurrent, mild: Secondary | ICD-10-CM
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
## 2020-05-27 ENCOUNTER — Ambulatory Visit (INDEPENDENT_AMBULATORY_CARE_PROVIDER_SITE_OTHER): Payer: PPO | Admitting: Family Medicine

## 2020-05-27 ENCOUNTER — Encounter: Payer: Self-pay | Admitting: Family Medicine

## 2020-05-27 ENCOUNTER — Other Ambulatory Visit: Payer: Self-pay

## 2020-05-27 VITALS — BP 132/74 | HR 74 | Temp 97.4°F | Wt 226.2 lb

## 2020-05-27 DIAGNOSIS — E7849 Other hyperlipidemia: Secondary | ICD-10-CM | POA: Diagnosis not present

## 2020-05-27 DIAGNOSIS — Z23 Encounter for immunization: Secondary | ICD-10-CM | POA: Diagnosis not present

## 2020-05-27 DIAGNOSIS — M0579 Rheumatoid arthritis with rheumatoid factor of multiple sites without organ or systems involvement: Secondary | ICD-10-CM

## 2020-05-27 DIAGNOSIS — G5701 Lesion of sciatic nerve, right lower limb: Secondary | ICD-10-CM | POA: Diagnosis not present

## 2020-05-27 DIAGNOSIS — Z6841 Body Mass Index (BMI) 40.0 and over, adult: Secondary | ICD-10-CM | POA: Diagnosis not present

## 2020-05-27 DIAGNOSIS — F341 Dysthymic disorder: Secondary | ICD-10-CM

## 2020-05-27 MED ORDER — ATORVASTATIN CALCIUM 80 MG PO TABS: 40.0000 mg | ORAL_TABLET | ORAL | 1 refills | Status: DC

## 2020-05-27 NOTE — Patient Instructions (Signed)
Piriformis Syndrome Rehab Ask your health care provider which exercises are safe for you. Do exercises exactly as told by your health care provider and adjust them as directed. It is normal to feel mild stretching, pulling, tightness, or discomfort as you do these exercises. Stop right away if you feel sudden pain or your pain gets worse. Do not begin these exercises until told by your health care provider. Stretching and range-of-motion exercises These exercises warm up your muscles and joints and improve the movement and flexibility of your hip and pelvis. The exercises also help to relieve pain, numbness, and tingling. Hip rotation This is an exercise in which you lie on your back and stretch the muscles that rotate your hip (hip rotators) to stretch your buttocks. 1. Lie on your back on a firm surface. 2. Pull your left / right knee toward your same shoulder with your left / right hand until your knee is pointing toward the ceiling. Hold your left / right ankle with your other hand. 3. Keeping your knee steady, gently pull your left / right ankle toward your other shoulder until you feel a stretch in your buttocks. 4. Hold this position for __________ seconds. Repeat __________ times. Complete this exercise __________ times a day. Hip extensor This is an exercise in which you lie on your back and pull your knee to your chest. 1. Lie on your back on a firm surface. Both of your legs should be straight. 2. Pull your left / right knee to your chest. Hold your leg in this position by holding onto the back of your thigh or the front of your knee. 3. Hold this position for __________ seconds. 4. Slowly return to the starting position. Repeat __________ times. Complete this exercise __________ times a day. Strengthening exercises These exercises build strength and endurance in your hip and thigh muscles. Endurance is the ability to use your muscles for a long time, even after they get  tired. Straight leg raises, side-lying This exercise strengthens the muscles that rotate the leg at the hip and move it away from your body (hip abductors). 1. Lie on your side with your left / right leg in the top position. Lie so your head, shoulder, knee, and hip line up. Bend your bottom knee to help you balance. 2. Lift your top leg 4-6 inches (10-15 cm) while keeping your toes pointed straight ahead. 3. Hold this position for __________ seconds. 4. Slowly lower your leg to the starting position. 5. Let your muscles relax completely after each repetition. Repeat __________ times. Complete this exercise __________ times a day. Hip abduction and rotation This is sometimes called quadruped (on hands and knees) exercises. 1. Get on your hands and knees on a firm, lightly padded surface. Your hands should be directly below your shoulders, and your knees should be directly below your hips. 2. Lift your left / right knee out to the side. Keep your knee bent. Do not twist your body. 3. Hold this position for __________ seconds. 4. Slowly lower your leg. Repeat __________ times. Complete this exercise __________ times a day. Straight leg raises, face-down This exercise stretches the muscles that move your hips away from the front of the pelvis (hip extensors). 1. Lie on your abdomen on a bed or a firm surface with a pillow under your hips. 2. Squeeze your buttocks muscles and lift your left / right leg about 4-6 inches (10-15 cm) off the bed. Do not let your back arch. 3. Hold  this position for __________ seconds. 4. Slowly lower your leg to the starting position. 5. Let your muscles relax completely after each repetition. Repeat __________ times. Complete this exercise __________ times a day. This information is not intended to replace advice given to you by your health care provider. Make sure you discuss any questions you have with your health care provider. Document Revised: 11/28/2018  Document Reviewed: 05/30/2018 Elsevier Patient Education  2020 [employer]  

## 2020-05-27 NOTE — Progress Notes (Signed)
Patient ID: [name], female    DOB: 09-28-1950, 69 y.o.   MRN: [id number]   Chief Complaint  Patient presents with  . Hyperlipidemia  . Arthritis    Patient has increased right hip and shoulder pain   Subjective:    HPI  Pt seen to establish care.  Has h/o depression and anxiety, seeing [name] therapist.  1x per 3 wks. Long term concerns with depression/anxiety-chronic. Not on medication. At one time was on medications. Was on antidepressant.  Didn't like it due to feeling middle and not feeling emotions.  Anxiety is chronic now and related to changes with the pandemic and age related conditions. Away from family.  Extended family living out of state.  Seeing Dr. Estanislado [name], rheum and has RA. And is taking care of her RA. In past had shoulder in past and radiation down to rt trap. Pain in rt piriformis and inflamed in bursa. Feeling when cold and damp area feels it hurts her rt hip/si joint. Hard to walk at times. And rt shoulder pain.  Taking tylenol.  Told her to stop taking diclofenac.  No aleve.   HLD- pt not on her medications.  Cholesterol -288 ldl 220 Hdl -62.  Weight doctor- eating a lot of protein. Has elevated cholesterol and been off the lipitor. Going to stop eat eggs with yolk. Used to be on 20, then inc over time to 80. Seeing Dr. Adair [name], weight loss doctor and helping with diet. Dr. Adair [name] recommended -Alternating lipitor 40 mg and 80mg  medications. losing weight and seeing her monthly. Gained 5 lbs in past week. 93 g protein per day. Limited sugar and dairy and dec carbs. Pt stating she is wanting to go back to taking 80mg  lipitor daily.    Medical History [name] has a past medical history of Allergic rhinitis, Anxiety, Arthritis, Asthma, Constipation, Depression, Gallbladder problem, Hematuria (08/25/2015), Hyperlipidemia, Hypertension, Obesity, Osteoarthritis, Pelvic pain in female (08/25/2015), Rheumatoid arthritis ([place]), Swallowing  difficulty, and Swelling.   Outpatient Encounter Medications as of 05/27/2020  Medication Sig  . acetaminophen (TYLENOL) 500 MG tablet Take 500-1,000 mg by mouth every 6 (six) hours as needed (pain).   [name] atorvastatin (LIPITOR) 80 MG tablet Take 0.5-1 tablets (40-80 mg total) by mouth See admin instructions. Alternating between 40 mg & 80 mg by mouth every other day in the morning  . B Complex Vitamins (VITAMIN B COMPLEX) TABS Take 1 tablet by mouth at bedtime.   . chlorzoxazone (PARAFON) 500 MG tablet Take 500 mg by mouth daily as needed for muscle spasms (typically with travel only).   . Cholecalciferol (VITAMIN D) 125 MCG (5000 UT) CAPS Take 5,000 Units by mouth daily after breakfast.  . Etanercept (ENBREL MINI) 50 MG/ML SOCT Inject 50 mg into the skin once for 1 dose. (Patient taking differently: Inject 50 mg into the skin once a week. Once a week)  . famotidine (PEPCID) 40 MG tablet TAKE 1 TABLET BY MOUTH EVERY DAY AS NEEDED  . hydroxychloroquine (PLAQUENIL) 200 MG tablet TAKE 1 TABLET BY MOUTH TWICE A DAY  . Magnesium 500 MG CAPS Take 500 mg by mouth.  . Probiotic Product (ALIGN PO) Take 1 capsule by mouth daily.   [name] Propylene Glycol (SYSTANE BALANCE OP) Place 1 drop into both eyes 4 (four) times daily as needed (dry/irritated eyes.).   [name] triamcinolone cream (KENALOG) 0.1 % Apply 1 application topically 2 (two) times daily. Prn rash; use up to 2 weeks  . [DISCONTINUED] atorvastatin (LIPITOR)  80 MG tablet Take 0.5-1 tablets (40-80 mg total) by mouth See admin instructions. Alternating between 40 mg & 80 mg by mouth every other day in the morning   Facility-Administered Encounter Medications as of 05/27/2020  Medication  . methylPREDNISolone acetate (DEPO-MEDROL) injection 40 mg  . methylPREDNISolone acetate (DEPO-MEDROL) injection 40 mg     Review of Systems  Constitutional: Negative for chills and fever.  HENT: Negative for congestion, rhinorrhea and sore throat.   Respiratory:  Negative for cough, shortness of breath and wheezing.   Cardiovascular: Negative for chest pain and leg swelling.  Gastrointestinal: Negative for abdominal pain, diarrhea, nausea and vomiting.  Genitourinary: Negative for dysuria and frequency.  Musculoskeletal: Positive for arthralgias (rt shoulder pain). Negative for back pain.       +rt buttock pain/piriformis  Skin: Negative for rash.  Neurological: Negative for dizziness, weakness and headaches.     Vitals BP 132/74   Pulse 74   Temp (!) 97.4 F (36.3 C)   Wt 226 lb 3.2 oz (102.6 kg)   SpO2 100%   BMI 40.07 kg/m   Objective:   Physical Exam Vitals and nursing note reviewed.  Constitutional:      Appearance: Normal appearance. She is obese.  HENT:     Head: Normocephalic and atraumatic.     Nose: Nose normal.     Mouth/Throat:     Mouth: Mucous membranes are moist.     Pharynx: Oropharynx is clear.  Eyes:     Extraocular Movements: Extraocular movements intact.     Conjunctiva/sclera: Conjunctivae normal.     Pupils: Pupils are equal, round, and reactive to light.  Cardiovascular:     Rate and Rhythm: Normal rate and regular rhythm.     Pulses: Normal pulses.     Heart sounds: Murmur (3/6 systolic murmur) heard.   Pulmonary:     Effort: Pulmonary effort is normal.     Breath sounds: Normal breath sounds. No wheezing, rhonchi or rales.  Musculoskeletal:        General: Normal range of motion.     Right lower leg: No edema.     Left lower leg: No edema.  Skin:    General: Skin is warm and dry.     Findings: No lesion or rash.  Neurological:     General: No focal deficit present.     Mental Status: She is alert and oriented to person, place, and time.  Psychiatric:        Mood and Affect: Mood normal.        Behavior: Behavior normal.      Assessment and Plan   1. ANXIETY DEPRESSION  2. Other hyperlipidemia - atorvastatin (LIPITOR) 80 MG tablet; Take 0.5-1 tablets (40-80 mg total) by mouth See admin  instructions. Alternating between 40 mg & 80 mg by mouth every other day in the morning  Dispense: 90 tablet; Refill: 1  3. Piriformis syndrome of right side  4. Need for vaccination - Flu Vaccine QUAD High Dose(Fluad)  5. Seropositive rheumatoid arthritis of multiple sites ([place])  6. Class 3 severe obesity due to excess calories without serious comorbidity with body mass index (BMI) of 40.0 to 44.9 in adult [facility])   Anxiety and depression- stable. Seeing therapist now and not on medications at this time.  Wanting to try counseling before going back to medications.  RA- seeing Rheum. Going to try to go to her rheum for shoulder injections, but if needed we can do it.  Advising only 1-2x per yr of cortisone injections if needed.  Not able to take nsaids.  hld- uncontrolled.  pt to restart back on 80mg  lipitor. Refills sent.  Piriformis syndrome- Gave exercises for piriformis if not improving and wanting to see ortho pt to call or rto. Reviewed notes from Dr. Kathi Ludwig from 05/19/20.  Advised pt to do exercises for the pain in rt hip/piriformis also.  If not improving may need PT or to see ortho.  Obesity- cont with f/u with weight loss doctor and diet changes.  Increase exercising as tolerated.  F/u 65mo or prn.

## 2020-06-01 ENCOUNTER — Other Ambulatory Visit: Payer: Self-pay

## 2020-06-01 DIAGNOSIS — M0579 Rheumatoid arthritis with rheumatoid factor of multiple sites without organ or systems involvement: Secondary | ICD-10-CM

## 2020-06-01 MED ORDER — ENBREL MINI 50 MG/ML ~~LOC~~ SOCT: 50.0000 mg | SUBCUTANEOUS | 0 refills | Status: DC

## 2020-06-01 NOTE — Telephone Encounter (Signed)
Refill request received via fax from [facility] for enbrel mini.   Last Visit: 05/19/2020 Next Visit: 08/18/2020 Labs: 05/14/2020 CBC and CMP are normal. TB Gold: 02/02/2020 negative   Current Dose per office note on 05/19/2020: enbrel dose not specified.   AJ:OINOMVEHMCNO rheumatoid arthritis of multiple sites  Okay to refill enbrel mini?

## 2020-06-03 ENCOUNTER — Ambulatory Visit: Payer: PPO | Attending: Internal Medicine

## 2020-06-03 DIAGNOSIS — Z23 Encounter for immunization: Secondary | ICD-10-CM

## 2020-06-03 NOTE — Progress Notes (Signed)
   Covid-19 Vaccination Clinic  Name:  [name]    MRN: [id number] DOB: 21-Feb-1951  06/03/2020  Ms. [name] was observed post Covid-19 immunization for 15 minutes without incident. She was provided with Vaccine Information Sheet and instruction to access the V-Safe system.   Ms. [name] was instructed to call 911 with any severe reactions post vaccine: [name] Difficulty breathing  . Swelling of face and throat  . A fast heartbeat  . A bad rash all over body  . Dizziness and weakness

## 2020-06-08 ENCOUNTER — Other Ambulatory Visit: Payer: Self-pay

## 2020-06-08 ENCOUNTER — Ambulatory Visit (INDEPENDENT_AMBULATORY_CARE_PROVIDER_SITE_OTHER): Payer: PPO | Admitting: Psychiatry

## 2020-06-08 DIAGNOSIS — F411 Generalized anxiety disorder: Secondary | ICD-10-CM

## 2020-06-08 DIAGNOSIS — F330 Major depressive disorder, recurrent, mild: Secondary | ICD-10-CM
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
## 2020-06-14 ENCOUNTER — Telehealth: Payer: Self-pay | Admitting: Rheumatology

## 2020-06-14 NOTE — Telephone Encounter (Signed)
Tried to return call, but the voicemail did not sound like it was for [employer]. Our office can not verify who called from the patient assistance company. They are not directly associated with our office.

## 2020-06-14 NOTE — Telephone Encounter (Signed)
[facility] received a call in reference to patient from [employer] in regards to patient assistance. They need to verify person whom is requesting information is associated with our office. Please call to verify.

## 2020-06-17 ENCOUNTER — Ambulatory Visit (INDEPENDENT_AMBULATORY_CARE_PROVIDER_SITE_OTHER): Payer: PPO | Admitting: Family Medicine

## 2020-06-17 ENCOUNTER — Other Ambulatory Visit: Payer: Self-pay

## 2020-06-17 ENCOUNTER — Encounter (INDEPENDENT_AMBULATORY_CARE_PROVIDER_SITE_OTHER): Payer: Self-pay | Admitting: Family Medicine

## 2020-06-17 ENCOUNTER — Ambulatory Visit (INDEPENDENT_AMBULATORY_CARE_PROVIDER_SITE_OTHER): Payer: PPO | Admitting: Adult Health

## 2020-06-17 VITALS — BP 95/67 | HR 65 | Temp 98.0°F | Ht 63.0 in | Wt 217.0 lb

## 2020-06-17 DIAGNOSIS — Z6838 Body mass index (BMI) 38.0-38.9, adult: Secondary | ICD-10-CM

## 2020-06-17 DIAGNOSIS — M0579 Rheumatoid arthritis with rheumatoid factor of multiple sites without organ or systems involvement: Secondary | ICD-10-CM

## 2020-06-17 DIAGNOSIS — E785 Hyperlipidemia, unspecified: Secondary | ICD-10-CM | POA: Diagnosis not present

## 2020-06-17 NOTE — Progress Notes (Signed)
Chief Complaint:   OBESITY [name] is here to discuss her progress with her obesity treatment plan along with follow-up of her obesity related diagnoses. [name] is keeping a food journal and adhering to recommended goals of 1200 calories and 90+ grams of protein and states she is following her eating plan approximately 70% of the time. [name] states she is walking 2 times per week.  Today's visit was #: 25 Starting weight: 267 lbs Starting date: 10/31/2018 Today's weight: 217 lbs Today's date: 06/17/2020 Total lbs lost to date: 50 Total lbs lost since last in-office visit: 0  Interim History: [name] is walking with her husband 2 times per week. They walk "big box" stores for a few hours a few times per week. She is journaling daily and does keep calories at 1200 or less, but does not always meet her protein goals-sometimes averaging only 70 grams a day. She sometimes gets only 800 calories a day. She tends to skip a meal on the days her husband works. She has added in a scoop of protein powder recently to help meet her protein goals.   Subjective:   Hyperlipidemia, unspecified hyperlipidemia type. LDL is very high at 220; HDL and triglycerides within normal limits. [name] avoids egg yolks on the advice of her cardiologist. She is on 40 and 80 mg of atorvastatin alternating every other day.   Lab Results  Component Value Date   CHOL 288 (H) 05/14/2020   HDL 62 05/14/2020   LDLCALC 220 (H) 05/14/2020   TRIG 48 05/14/2020   CHOLHDL 4.6 (H) 05/14/2020   Lab Results  Component Value Date   ALT 11 05/14/2020   AST 17 05/14/2020   ALKPHOS 63 05/14/2020   BILITOT 0.4 05/14/2020   The 10-year ASCVD risk score [name] DC Jr., et al., 2013) is: 5.3%   Values used to calculate the score:     Age: 69 years     Sex: Female     Is Non-Hispanic African American: No     Diabetic: No     Tobacco smoker: No     Systolic Blood Pressure: 95 mmHg     Is BP treated: No     HDL  Cholesterol: 62 mg/dL     Total Cholesterol: 288 mg/dL  Rheumatoid arthritis, involving unspecified site, unspecified whether rheumatoid factor present ([name]). Evi notes she has good and bad days. She states pain and fatigue significantly impact her life. She says MS is well controlled on Enbrel currently.  Assessment/Plan:   Hyperlipidemia, unspecified hyperlipidemia type.  She will continue atorvastatin as directed and follow-up with her cardiologist as directed.   Rheumatoid arthritis, involving unspecified site, unspecified whether rheumatoid factor present ([name]). [name] will follow-up with Rheumatology as directed.   Class 2 severe obesity with serious comorbidity and body mass index (BMI) of 38.0 to 38.9 in adult, unspecified obesity type ([name]).  [name] is currently in the action stage of change. As such, her goal is to continue with weight loss efforts. She has agreed to keeping a food journal and adhering to recommended goals of 1200 calories and 90 grams of protein daily.   We discussed ideas for protein meal or snack on her husband's working days. She may also have a protein bar if she is unable to get in a snack or meal. She will try to increase her calories to 1100-1200 per day.  Exercise goals: [name] will add walking on treadmill for 5 minutes at a slow  pace a few days per week.  Behavioral modification strategies: increasing lean protein intake, no skipping meals and meal planning and cooking strategies.  [name] has agreed to follow-up with our clinic in 6 weeks per patient request.   Objective:   Blood pressure 95/67, pulse 65, temperature 98 F (36.7 C), SpO2 99 %. There is no height or weight on file to calculate BMI.  General: Cooperative, alert, well developed, in no acute distress. HEENT: Conjunctivae and lids unremarkable. Cardiovascular: Regular rhythm.  Lungs: Normal work of breathing. Neurologic: No focal deficits.   Lab Results  Component Value  Date   CREATININE 0.85 05/14/2020   BUN 19 05/14/2020   NA 138 05/14/2020   K 4.1 05/14/2020   CL 100 05/14/2020   CO2 23 05/14/2020   Lab Results  Component Value Date   ALT 11 05/14/2020   AST 17 05/14/2020   ALKPHOS 63 05/14/2020   BILITOT 0.4 05/14/2020   Lab Results  Component Value Date   HGBA1C 5.4 02/27/2019   HGBA1C 5.5 10/31/2018   Lab Results  Component Value Date   INSULIN 8.3 02/27/2019   INSULIN 12.3 10/31/2018   Lab Results  Component Value Date   TSH 2.53 03/17/2020   Lab Results  Component Value Date   CHOL 288 (H) 05/14/2020   HDL 62 05/14/2020   LDLCALC 220 (H) 05/14/2020   TRIG 48 05/14/2020   CHOLHDL 4.6 (H) 05/14/2020   Lab Results  Component Value Date   WBC 4.5 05/14/2020   HGB 13.8 05/14/2020   HCT 43.2 05/14/2020   MCV 90 05/14/2020   PLT 190 05/14/2020   No results found for: IRON, TIBC, FERRITIN  Obesity Behavioral Intervention:   Approximately 15 minutes were spent on the discussion below.  ASK: We discussed the diagnosis of obesity with [name] today and [name] agreed to give [place] permission to discuss obesity behavioral modification therapy today.  ASSESS: [name] has the diagnosis of obesity and her BMI today is 38.4. [name] is in the action stage of change.   ADVISE: [name] was educated on the multiple health risks of obesity as well as the benefit of weight loss to improve her health. She was advised of the need for long term treatment and the importance of lifestyle modifications to improve her current health and to decrease her risk of future health problems.  AGREE: Multiple dietary modification options and treatment options were discussed and [name] agreed to follow the recommendations documented in the above note.  ARRANGE: [name] was educated on the importance of frequent visits to treat obesity as outlined per CMS and USPSTF guidelines and agreed to schedule her next follow up appointment today.  Attestation  Statements:   Reviewed by clinician on day of visit: allergies, medications, problem list, medical history, surgical history, family history, social history, and previous encounter notes.  [name], am acting as Location manager for [employer], FNP-C   I have reviewed the above documentation for accuracy and completeness, and I agree with the above. -  Georgianne Fick, FNP

## 2020-06-22 ENCOUNTER — Other Ambulatory Visit (INDEPENDENT_AMBULATORY_CARE_PROVIDER_SITE_OTHER): Payer: Self-pay | Admitting: Family Medicine

## 2020-06-22 DIAGNOSIS — E7849 Other hyperlipidemia: Secondary | ICD-10-CM

## 2020-06-22 NOTE — Telephone Encounter (Signed)
[name] pt

## 2020-06-23 NOTE — Telephone Encounter (Signed)
Please review

## 2020-07-21 ENCOUNTER — Encounter: Payer: Self-pay | Admitting: Rheumatology

## 2020-07-21 DIAGNOSIS — Z79899 Other long term (current) drug therapy: Secondary | ICD-10-CM

## 2020-07-23 DIAGNOSIS — Z79899 Other long term (current) drug therapy: Secondary | ICD-10-CM | POA: Diagnosis not present

## 2020-07-24 LAB — CBC WITH DIFFERENTIAL/PLATELET
Basophils Absolute: 0 10*3/uL (ref 0.0–0.2)
Basos: 1 %
EOS (ABSOLUTE): 0.1 10*3/uL (ref 0.0–0.4)
Eos: 2 %
Hematocrit: 41.4 % (ref 34.0–46.6)
Hemoglobin: 13.4 g/dL (ref 11.1–15.9)
Immature Grans (Abs): 0 10*3/uL (ref 0.0–0.1)
Immature Granulocytes: 0 %
Lymphocytes Absolute: 2.4 10*3/uL (ref 0.7–3.1)
Lymphs: 44 %
MCH: 29.5 pg (ref 26.6–33.0)
MCHC: 32.4 g/dL (ref 31.5–35.7)
MCV: 91 fL (ref 79–97)
Monocytes Absolute: 0.6 10*3/uL (ref 0.1–0.9)
Monocytes: 11 %
Neutrophils Absolute: 2.3 10*3/uL (ref 1.4–7.0)
Neutrophils: 42 %
Platelets: 177 10*3/uL (ref 150–450)
RBC: 4.55 x10E6/uL (ref 3.77–5.28)
RDW: 14.7 % (ref 11.7–15.4)
WBC: 5.4 10*3/uL (ref 3.4–10.8)

## 2020-07-24 LAB — CMP14+EGFR
ALT: 16 IU/L (ref 0–32)
AST: 22 IU/L (ref 0–40)
Albumin/Globulin Ratio: 1.6 (ref 1.2–2.2)
Albumin: 4.1 g/dL (ref 3.8–4.8)
Alkaline Phosphatase: 90 IU/L (ref 44–121)
BUN/Creatinine Ratio: 22 (ref 12–28)
BUN: 20 mg/dL (ref 8–27)
Bilirubin Total: 0.4 mg/dL (ref 0.0–1.2)
CO2: 24 mmol/L (ref 20–29)
Calcium: 9.8 mg/dL (ref 8.7–10.3)
Chloride: 105 mmol/L (ref 96–106)
Creatinine, Ser: 0.93 mg/dL (ref 0.57–1.00)
GFR calc Af Amer: 73 mL/min/{1.73_m2} (ref >59)
GFR calc non Af Amer: 63 mL/min/{1.73_m2} (ref >59)
Globulin, Total: 2.6 g/dL (ref 1.5–4.5)
Glucose: 87 mg/dL (ref 65–99)
Potassium: 4.4 mmol/L (ref 3.5–5.2)
Sodium: 142 mmol/L (ref 134–144)
Total Protein: 6.7 g/dL (ref 6.0–8.5)

## 2020-07-26 NOTE — Telephone Encounter (Signed)
CBC and CMP WNL

## 2020-07-29 ENCOUNTER — Encounter (INDEPENDENT_AMBULATORY_CARE_PROVIDER_SITE_OTHER): Payer: Self-pay | Admitting: Family Medicine

## 2020-07-29 ENCOUNTER — Ambulatory Visit (INDEPENDENT_AMBULATORY_CARE_PROVIDER_SITE_OTHER): Payer: PPO | Admitting: Family Medicine

## 2020-07-29 ENCOUNTER — Other Ambulatory Visit: Payer: Self-pay

## 2020-07-29 VITALS — BP 123/81 | HR 66 | Temp 98.1°F | Ht 63.0 in | Wt 219.0 lb

## 2020-07-29 DIAGNOSIS — Z6838 Body mass index (BMI) 38.0-38.9, adult: Secondary | ICD-10-CM | POA: Diagnosis not present

## 2020-07-29 DIAGNOSIS — E7849 Other hyperlipidemia: Secondary | ICD-10-CM | POA: Diagnosis not present

## 2020-07-29 DIAGNOSIS — E559 Vitamin D deficiency, unspecified: Secondary | ICD-10-CM | POA: Diagnosis not present

## 2020-07-29 MED ORDER — ATORVASTATIN CALCIUM 80 MG PO TABS: 40.0000 mg | ORAL_TABLET | ORAL | 1 refills | Status: DC

## 2020-08-02 NOTE — Progress Notes (Signed)
Chief Complaint:   OBESITY [name] is here to discuss her progress with her obesity treatment plan along with follow-up of her obesity related diagnoses. [name] is on keeping a food journal and adhering to recommended goals of 1200 calories and 90 grams of protein daily and states she is following her eating plan approximately 90% of the time. [name] states she is walking and doing housework for 30+ minutes 7 times per week.  Today's visit was #: 26 Starting weight: 267 lbs Starting date: 10/31/2018 Today's weight: 219 lbs Today's date: 07/29/2020 Total lbs lost to date: 48 Total lbs lost since last in-office visit: 0  Interim History: [name] had a family Thanksgiving with a visit from her daughter. She has been journaling, and staying around 1100-1200 calories and aiming for 93 grams of protein daily. She has made a pasta soup this week and has some baked goods. She is trying to cut out meat.  Subjective:   1. Other hyperlipidemia [name]'s last LDL was 220, and total cholesterol was 288. She is on statin.  2. Vitamin D deficiency [name]'s last Vit D level was 49.1. She is on OTC Vit D.  Assessment/Plan:   1. Other hyperlipidemia Cardiovascular risk and specific lipid/LDL goals reviewed. We discussed several lifestyle modifications today and [name] will continue to work on diet, exercise and weight loss efforts. We will refill Lipitor for 90 days with 1 refill. Orders and follow up as documented in patient record.   Counseling Intensive lifestyle modifications are the first line treatment for this issue. . Dietary changes: Increase soluble fiber. Decrease simple carbohydrates. . Exercise changes: Moderate to vigorous-intensity aerobic activity 150 minutes per week if tolerated. . Lipid-lowering medications: see documented in medical record.  - atorvastatin (LIPITOR) 80 MG tablet; Take 0.5-1 tablets (40-80 mg total) by mouth See admin instructions. Alternating between 40  mg & 80 mg by mouth every other day in the morning  Dispense: 90 tablet; Refill: 1  2. Vitamin D deficiency Low Vitamin D level contributes to fatigue and are associated with obesity, breast, and colon cancer. [name] agreed to continue taking OTC Vitamin D, and we will repeat labs in March 2021. She will follow-up for routine testing of Vitamin D, at least 2-3 times per year to avoid over-replacement.  3. Class 2 severe obesity with serious comorbidity and body mass index (BMI) of 38.0 to 38.9 in adult, unspecified obesity type [name]) [name] is currently in the action stage of change. As such, her goal is to continue with weight loss efforts. She has agreed to keeping a food journal and adhering to recommended goals of 1200 calories and 93+ grams of protein daily.   Exercise goals: As is.  Behavioral modification strategies: increasing lean protein intake, meal planning and cooking strategies, keeping healthy foods in the home and holiday eating strategies .  [name] has agreed to follow-up with our clinic in 6 weeks. She was informed of the importance of frequent follow-up visits to maximize her success with intensive lifestyle modifications for her multiple health conditions.   Objective:   Blood pressure 123/81, pulse 66, temperature 98.1 F (36.7 C), temperature source Oral, height 5\' 3"  (1.6 m), weight 219 lb (99.3 kg), SpO2 99 %. Body mass index is 38.79 kg/m.  General: Cooperative, alert, well developed, in no acute distress. HEENT: Conjunctivae and lids unremarkable. Cardiovascular: Regular rhythm.  Lungs: Normal work of breathing. Neurologic: No focal deficits.   Lab Results  Component Value Date   CREATININE  0.93 07/23/2020   BUN 20 07/23/2020   NA 142 07/23/2020   K 4.4 07/23/2020   CL 105 07/23/2020   CO2 24 07/23/2020   Lab Results  Component Value Date   ALT 16 07/23/2020   AST 22 07/23/2020   ALKPHOS 90 07/23/2020   BILITOT 0.4 07/23/2020   Lab Results   Component Value Date   HGBA1C 5.4 02/27/2019   HGBA1C 5.5 10/31/2018   Lab Results  Component Value Date   INSULIN 8.3 02/27/2019   INSULIN 12.3 10/31/2018   Lab Results  Component Value Date   TSH 2.53 03/17/2020   Lab Results  Component Value Date   CHOL 288 (H) 05/14/2020   HDL 62 05/14/2020   LDLCALC 220 (H) 05/14/2020   TRIG 48 05/14/2020   CHOLHDL 4.6 (H) 05/14/2020   Lab Results  Component Value Date   WBC 5.4 07/23/2020   HGB 13.4 07/23/2020   HCT 41.4 07/23/2020   MCV 91 07/23/2020   PLT 177 07/23/2020   No results found for: IRON, TIBC, FERRITIN  Obesity Behavioral Intervention:   Approximately 15 minutes were spent on the discussion below.  ASK: We discussed the diagnosis of obesity with [name] today and [name] agreed to give [place] permission to discuss obesity behavioral modification therapy today.  ASSESS: [name] has the diagnosis of obesity and her BMI today is 38.8. [name] is in the action stage of change.   ADVISE: [name] was educated on the multiple health risks of obesity as well as the benefit of weight loss to improve her health. She was advised of the need for long term treatment and the importance of lifestyle modifications to improve her current health and to decrease her risk of future health problems.  AGREE: Multiple dietary modification options and treatment options were discussed and [name] agreed to follow the recommendations documented in the above note.  ARRANGE: [name] was educated on the importance of frequent visits to treat obesity as outlined per CMS and USPSTF guidelines and agreed to schedule her next follow up appointment today.  Attestation Statements:   Reviewed by clinician on day of visit: allergies, medications, problem list, medical history, surgical history, family history, social history, and previous encounter notes.   I, Trixie Dredge, am acting as transcriptionist for Coralie Common, MD.  I have  reviewed the above documentation for accuracy and completeness, and I agree with the above. - [name] Blossom, MD

## 2020-08-03 ENCOUNTER — Other Ambulatory Visit: Payer: Self-pay | Admitting: *Deleted

## 2020-08-03 DIAGNOSIS — M0579 Rheumatoid arthritis with rheumatoid factor of multiple sites without organ or systems involvement: Secondary | ICD-10-CM

## 2020-08-03 MED ORDER — ENBREL MINI 50 MG/ML ~~LOC~~ SOCT: 50.0000 mg | SUBCUTANEOUS | 0 refills | Status: DC

## 2020-08-03 NOTE — Telephone Encounter (Signed)
Refill request received via fax from [facility] for enbrel mini.   Last Visit: 05/19/2020 Next Visit: 08/18/2020 Labs: 05/14/2020 CBC and CMP are normal. TB Gold: 02/02/2020 negative   Current Dose per office note on 05/19/2020: enbrel dose not specified.   JP:[id number] rheumatoid arthritis of multiple sites  Okay to refill per Dr. Estanislado Pandy

## 2020-08-05 NOTE — Progress Notes (Signed)
Office Visit Note  Patient: [name]             Date of Birth: Oct 02, 1950           MRN: [id number]             PCP: Erven Colla, DO Referring: Erven Colla, DO Visit Date: 08/18/2020 Occupation: @GUAROCC @  Subjective:  Pain in both hands   History of Present Illness: [name] is a 69 y.o. female with history of seropositive rheumatoid arthritis and DDD.  She is on enbrel 50 mg sq injections once weekly and plaquenil 200 mg 1 tablet by mouth BID.  She denies any recent rheumatoid arthritis flares.  She continues to experience intermittent discomfort in both shoulders, both wrist joints, and both feet.  She follows up with her podiatrist every 3 months.  She tries to wear proper fitting shoes on a daily basis.  She has been using Voltaren gel topically as needed for bilateral wrist pain.  She also takes Tylenol as needed for pain relief.  She plans on discontinuing Plaquenil as recommended after her last office visit once her prescription runs out. She denies any recent infections.  She has received all 3 COVID-19 vaccinations as well as the first Shingrix vaccine.     Activities of Daily Living:  Patient reports morning stiffness for 1  hour.   Patient Denies nocturnal pain.  Difficulty dressing/grooming: Denies Difficulty climbing stairs: Denies Difficulty getting out of chair: Denies Difficulty using hands for taps, buttons, cutlery, and/or writing: Reports  Review of Systems  Constitutional: Positive for fatigue.  HENT: Positive for mouth dryness and nose dryness. Negative for mouth sores.   Eyes: Positive for pain and dryness. Negative for visual disturbance.  Respiratory: Negative for cough, hemoptysis, shortness of breath and difficulty breathing.   Cardiovascular: Negative for chest pain, palpitations, hypertension and swelling in legs/feet.  Gastrointestinal: Negative for blood in stool, constipation and diarrhea.  Endocrine: Negative for increased  urination.  Genitourinary: Negative for painful urination.  Musculoskeletal: Positive for arthralgias, joint pain, muscle weakness and morning stiffness. Negative for joint swelling, myalgias, muscle tenderness and myalgias.  Skin: Negative for color change, pallor, rash, hair loss, nodules/bumps, skin tightness, ulcers and sensitivity to sunlight.  Allergic/Immunologic: Negative for susceptible to infections.  Neurological: Positive for headaches. Negative for dizziness, numbness and weakness.  Hematological: Negative for swollen glands.  Psychiatric/Behavioral: Positive for depressed mood. Negative for sleep disturbance. The patient is nervous/anxious.     PMFS History:  Patient Active Problem List   Diagnosis Date Noted  . Vitamin D deficiency 07/29/2019  . Depression with anxiety 06/19/2019  . History of total knee replacement, bilateral 10/30/2018  . History of MRSA infection 04/04/2017  . High risk medication use 03/02/2017  . Hypercalcemia 03/02/2017  . Pelvic pain in female 08/25/2015  . Hematuria 08/25/2015  . History of adenomatous polyp of colon 10/01/2014  . Seropositive rheumatoid arthritis of multiple sites ([place]) 07/20/2014  . History of juvenile rheumatoid arthritis 07/20/2014  . Impaired fasting glucose 07/20/2014  . NEOPLASM, SKIN, UNCERTAIN BEHAVIOR 67/89/3810  . Unilateral primary osteoarthritis, right knee 09/23/2008  . CALF PAIN, RIGHT 09/23/2008  . ANXIETY DEPRESSION 08/07/2008  . Class 2 severe obesity with serious comorbidity and body mass index (BMI) of 38.0 to 38.9 in adult ([place]) 12/19/2007  . DDD (degenerative disc disease), lumbar 06/27/2007  . Osteopenia of both forearms 06/27/2007  . HIP PAIN, LEFT 06/18/2007  . CONSTIPATION 05/16/2007  .  Essential hypertension 04/04/2007  . Psoriasis 03/07/2007  . OSTEOARTHROSIS, GENERALIZED, MULTIPLE SITES 03/07/2007  . ANOSMIA 03/07/2007  . LIPOMA 02/21/2007  . Hyperlipidemia 02/21/2007  . ALLERGIC RHINITIS  02/21/2007  . ASTHMA 02/21/2007  . ARTHRITIS 02/21/2007  . KNEE PAIN, LEFT 02/21/2007  . LOW BACK PAIN, CHRONIC 02/21/2007    Past Medical History:  Diagnosis Date  . Allergic rhinitis   . Anxiety   . Arthritis   . Asthma   . Constipation   . Depression   . Gallbladder problem   . Hematuria 08/25/2015  . Hyperlipidemia   . Hypertension   . Obesity   . Osteoarthritis   . Pelvic pain in female 08/25/2015  . Rheumatoid arthritis ([facility])   . Swallowing difficulty   . Swelling     Family History  Problem Relation Age of Onset  . Heart attack Father   . Alcoholism Father   . Depression Father   . Heart disease Father   . Hyperlipidemia Father   . Hypertension Father   . Leukemia Mother   . Hypertension Mother   . Cancer Mother   . Depression Mother   . Obesity Sister   . Other Sister        breathing problems  . Hepatitis C Sister   . Arthritis Sister   . Depression Sister   . Colon cancer Sister   . Squamous cell carcinoma Sister   . Emphysema Brother   . Obesity Daughter   . Polycystic ovary syndrome Daughter   . Obesity Son   . Other Son        knee problems  . Cancer Maternal Grandmother        breast  . Other Brother        aneursym  . Arthritis Sister   . Depression Sister   . Obesity Son   . Asthma Son   . Obesity Son   . Diabetes Other        runs on dad's side of the family   Past Surgical History:  Procedure Laterality Date  . BACK SURGERY    . CHOLECYSTECTOMY    . COLONOSCOPY  2007   sessile sigmoid polyp x 1; path: serrated adenoma  . COLONOSCOPY N/A 10/12/2014   Procedure: COLONOSCOPY;  Surgeon: West Bali, MD;  Location: [facility];  Service: Endoscopy;  Laterality: N/A;  . COLONOSCOPY N/A 03/05/2020   Procedure: COLONOSCOPY;  Surgeon: Corbin Ade, MD;  Location: [facility];  Service: Endoscopy;  Laterality: N/A;  9:30  . JOINT REPLACEMENT     BIL knee   . KNEE ARTHROPLASTY    . KNEE SURGERY     bilateral knee replacement  .  LIPOMA EXCISION     x 2  . TONSILLECTOMY AND ADENOIDECTOMY     Social History   Social History Narrative  . Not on file   Immunization History  Administered Date(s) Administered  . Fluad Quad(high Dose 65+) 05/27/2020  . Influenza Split 06/17/2013  . Influenza Whole 06/27/2007, 05/08/2008  . Influenza, High Dose Seasonal PF 06/14/2017, 06/18/2018  . Influenza,inj,Quad PF,6+ Mos 07/12/2015  . Influenza-Unspecified 06/14/2017, 06/18/2018, 05/11/2019  . PFIZER SARS-COV-2 Vaccination 10/12/2019, 11/04/2019, 06/03/2020  . Pneumococcal Polysaccharide-23 02/18/1993, 06/27/2007  . Zoster Recombinat (Shingrix) 08/19/2018     Objective: Vital Signs: BP 113/78 (BP Location: Left Arm, Patient Position: Sitting, Cuff Size: Large)   Pulse 64   Resp 17   Ht 5\' 5"  (1.651 m)   Wt  230 lb 6.4 oz (104.5 kg)   BMI 38.34 kg/m    Physical Exam Vitals and nursing note reviewed.  Constitutional:      Appearance: She is well-developed and well-nourished.  HENT:     Head: Normocephalic and atraumatic.  Eyes:     Extraocular Movements: EOM normal.     Conjunctiva/sclera: Conjunctivae normal.  Cardiovascular:     Pulses: Intact distal pulses.  Pulmonary:     Effort: Pulmonary effort is normal.  Abdominal:     Palpations: Abdomen is soft.  Musculoskeletal:     Cervical back: Normal range of motion.  Skin:    General: Skin is warm and dry.     Capillary Refill: Capillary refill takes less than 2 seconds.  Neurological:     Mental Status: She is alert and oriented to person, place, and time.  Psychiatric:        Mood and Affect: Mood and affect normal.        Behavior: Behavior normal.      Musculoskeletal Exam: C-spine slightly limited ROM with lateral rotation.  Thoracic and lumbar spine good ROM.  Midline spinal tenderness in the lumbar region.  Shoulder joint abduction to about 120 degrees.  Elbow joints, wrist joints, MCPs, PIPs, and DIPs good ROM with no synovitis.  Complete fist  formation bilaterally.  Hip joints good ROM with no discomfort.  Knee joints good ROM with no discomfort.  No warmth or effusion of knee joints.  No tenderness of ankle joints.  Pedal edema bilaterally, L>R.  Overcrowding of toes.  PIP and DIP thickening consistent with osteoarthritis of both hands.  Loss of fat pad noted.  Tenderness along the plantar fascia of the right foot.    CDAI Exam: CDAI Score: 0.6  Patient Global: 3 mm; Provider Global: 3 mm Swollen: 0 ; Tender: 0  Joint Exam 08/18/2020   No joint exam has been documented for this visit   There is currently no information documented on the homunculus. Go to the Rheumatology activity and complete the homunculus joint exam.  Investigation: No additional findings.  Imaging: MM 3D SCREEN BREAST BILATERAL  Result Date: 08/12/2020 CLINICAL DATA:  Screening. EXAM: DIGITAL SCREENING BILATERAL MAMMOGRAM WITH TOMO AND CAD COMPARISON:  Previous exam(s). ACR Breast Density Category b: There are scattered areas of fibroglandular density. FINDINGS: There are no findings suspicious for malignancy. Images were processed with CAD. IMPRESSION: No mammographic evidence of malignancy. A result letter of this screening mammogram will be mailed directly to the patient. RECOMMENDATION: Screening mammogram in one year. (Code:SM-B-01Y) BI-RADS CATEGORY  1: Negative. Electronically Signed   By: Marin Olp M.D.   On: 08/12/2020 17:08    Recent Labs: Lab Results  Component Value Date   WBC 5.4 07/23/2020   HGB 13.4 07/23/2020   PLT 177 07/23/2020   NA 142 07/23/2020   K 4.4 07/23/2020   CL 105 07/23/2020   CO2 24 07/23/2020   GLUCOSE 87 07/23/2020   BUN 20 07/23/2020   CREATININE 0.93 07/23/2020   BILITOT 0.4 07/23/2020   ALKPHOS 90 07/23/2020   AST 22 07/23/2020   ALT 16 07/23/2020   PROT 6.7 07/23/2020   ALBUMIN 4.1 07/23/2020   CALCIUM 9.8 07/23/2020   GFRAA 73 07/23/2020   QFTBGOLDPLUS NEGATIVE 02/02/2020    Speciality Comments:  PLQ Eye Exam: 12/03/2019 WNL @ [facility] Follow up in 1 year MTX -dcd 09/19 Enbrel start date 03/10/20  Procedures:  No procedures performed Allergies: Codeine, Cyclosporine,  and Propylene glycol   Assessment / Plan:     Visit Diagnoses: Seropositive rheumatoid arthritis of multiple sites ([place]) - positive RF, positive anti-CCP. Previous patient of Dr. Charlestine Night with multiple contractures, ultrasound of both hands on 10/30/18 revealed synovitis: She has no synovitis on examination today.  She has not had any recent rheumatoid arthritis flares.  She is clinically doing well on Enbrel 50 mg sq injections once weekly and Plaquenil 200 mg 1 tablet by mouth twice daily.  She has been tolerating both medications without any side effects.  She has not had any recent infections.  She continues to experience intermittent pain in both shoulders, both wrists, and both feet but no inflammation was noted.  She continues to see her podiatrist every 3 months.  She takes Tylenol as needed and uses Voltaren gel topically as needed for pain relief.  We discussed the use of natural anti-inflammatories.  She was advised to discontinue Plaquenil after her last office visit on 05/19/2020 but she has continued taking it.  She plans on discontinuing Plaquenil when she runs out of the prescription next month.  She was advised to notify us if she develops increased joint pain or joint swelling while off of Plaquenil.  She will continue on Enbrel 50 mg sq injections once weekly.  She will follow-up in the office in 5 months.  High risk medication use - Enbrel 50 mg sq injections once weekly-started on 03/10/20.  PLQ Eye Exam: 12/03/2019 (MTX -dcd 09/19).  CBC and CMP were updated on 07/23/20.  She will be due to update lab work in March and every 3 months to monitor for drug toxicity.  Standing orders for CBC and CMP were placed today.  TB gold negative on 02/02/20 and will continue to be monitored yearly- Plan: CBC with  Differential/Platelet, CMP14+EGFR She has not had any recent infections.  We discussed the importance of holding Enbrel if she develops signs or symptoms of an infection and to resume once infection has completely cleared.  She has received all 3 COVID-19 vaccinations as well as the first Shingrix vaccine.  History of total knee replacement, bilateral: Doing well.  She has good range of motion with no discomfort at this time.  DDD (degenerative disc disease), lumbar: S/p fusion. Chronic pain.  Midline spinal tenderness in the lumbar region.  No symptoms of radiculopathy.  She takes Tylenol as needed for pain relief.  Trochanteric bursitis of right hip: Resolved.  No tenderness to palpation on exam.   Osteopenia of multiple sites: DEXA 01/12/20: The BMD measured at Forearm Radius 33% is 0.555 g/cm2 with a T-score of -2.2.  She continues to take vitamin D 5000 units daily.  She has not had any recent falls or fractures.  Other medical conditions are listed as follows:   History of MRSA infection  History of anxiety  History of depression  History of asthma  History of hyperlipidemia  Essential hypertension  History of adenomatous polyp of colon  Orders: Orders Placed This Encounter  Procedures  . CBC with Differential/Platelet  . CMP14+EGFR   No orders of the defined types were placed in this encounter.    Follow-Up Instructions: Return in about 5 months (around 01/16/2021) for Rheumatoid arthritis, DDD.   Ofilia Neas, PA-C  Note - This record has been created using Dragon software.  Chart creation errors have been sought, but may not always  have been located. Such creation errors do not reflect on  the standard of medical  care.

## 2020-08-07 ENCOUNTER — Other Ambulatory Visit: Payer: Self-pay | Admitting: Rheumatology

## 2020-08-07 DIAGNOSIS — M0579 Rheumatoid arthritis with rheumatoid factor of multiple sites without organ or systems involvement: Secondary | ICD-10-CM

## 2020-08-10 ENCOUNTER — Other Ambulatory Visit (HOSPITAL_COMMUNITY): Payer: Self-pay | Admitting: Family Medicine

## 2020-08-10 DIAGNOSIS — Z1231 Encounter for screening mammogram for malignant neoplasm of breast: Secondary | ICD-10-CM

## 2020-08-11 ENCOUNTER — Ambulatory Visit (HOSPITAL_COMMUNITY)
Admission: RE | Admit: 2020-08-11 | Discharge: 2020-08-11 | Disposition: A | Discharge status: Home / Self Care | Payer: PPO | Source: Ambulatory Visit | Attending: Family Medicine | Admitting: Family Medicine

## 2020-08-11 ENCOUNTER — Other Ambulatory Visit (HOSPITAL_COMMUNITY): Payer: Self-pay | Admitting: Family Medicine

## 2020-08-11 ENCOUNTER — Other Ambulatory Visit: Payer: Self-pay

## 2020-08-11 DIAGNOSIS — Z1231 Encounter for screening mammogram for malignant neoplasm of breast: Secondary | ICD-10-CM | POA: Diagnosis not present

## 2020-08-18 ENCOUNTER — Encounter: Payer: Self-pay | Admitting: Physician Assistant

## 2020-08-18 ENCOUNTER — Other Ambulatory Visit: Payer: Self-pay

## 2020-08-18 ENCOUNTER — Ambulatory Visit: Payer: PPO | Admitting: Physician Assistant

## 2020-08-18 VITALS — BP 113/78 | HR 64 | Resp 17 | Ht 65.0 in | Wt 230.4 lb

## 2020-08-18 DIAGNOSIS — Z8709 Personal history of other diseases of the respiratory system: Secondary | ICD-10-CM | POA: Diagnosis not present

## 2020-08-18 DIAGNOSIS — M8589 Other specified disorders of bone density and structure, multiple sites: Secondary | ICD-10-CM

## 2020-08-18 DIAGNOSIS — M0579 Rheumatoid arthritis with rheumatoid factor of multiple sites without organ or systems involvement: Secondary | ICD-10-CM | POA: Diagnosis not present

## 2020-08-18 DIAGNOSIS — M5136 Other intervertebral disc degeneration, lumbar region: Secondary | ICD-10-CM

## 2020-08-18 DIAGNOSIS — Z96653 Presence of artificial knee joint, bilateral: Secondary | ICD-10-CM | POA: Diagnosis not present

## 2020-08-18 DIAGNOSIS — I10 Essential (primary) hypertension: Secondary | ICD-10-CM | POA: Diagnosis not present

## 2020-08-18 DIAGNOSIS — Z86010 Personal history of colonic polyps: Secondary | ICD-10-CM | POA: Diagnosis not present

## 2020-08-18 DIAGNOSIS — M51369 Other intervertebral disc degeneration, lumbar region without mention of lumbar back pain or lower extremity pain: Secondary | ICD-10-CM

## 2020-08-18 DIAGNOSIS — Z8614 Personal history of Methicillin resistant Staphylococcus aureus infection: Secondary | ICD-10-CM

## 2020-08-18 DIAGNOSIS — Z8639 Personal history of other endocrine, nutritional and metabolic disease: Secondary | ICD-10-CM | POA: Diagnosis not present

## 2020-08-18 DIAGNOSIS — Z8659 Personal history of other mental and behavioral disorders: Secondary | ICD-10-CM

## 2020-08-18 DIAGNOSIS — Z860101 Personal history of adenomatous and serrated colon polyps: Secondary | ICD-10-CM

## 2020-08-18 DIAGNOSIS — M7061 Trochanteric bursitis, right hip: Secondary | ICD-10-CM

## 2020-08-18 DIAGNOSIS — Z79899 Other long term (current) drug therapy: Secondary | ICD-10-CM

## 2020-08-18 NOTE — Patient Instructions (Signed)
Standing Labs We placed an order today for your standing lab work.   Please have your standing labs drawn in March and every 3 months    If possible, please have your labs drawn 2 weeks prior to your appointment so that the provider can discuss your results at your appointment.  We have open lab daily Monday through Thursday from 8:30-12:30 PM and 1:30-4:30 PM and Friday from 8:30-12:30 PM and 1:30-4:00 PM at the office of Dr. Shaili Deveshwar, [facility].   Please be advised, patients with office appointments requiring lab work will take precedents over walk-in lab work.  If possible, please come for your lab work on Monday and Friday afternoons, as you may experience shorter wait times. The office is located at [address], [address], [place], [place] [address] No appointment is necessary.   Labs are drawn by [facility]. Please bring your co-pay at the time of your lab draw.  You may receive a bill from [facility] for your lab work.  If you wish to have your labs drawn at another location, please call the office 24 hours in advance to send orders.  If you have any questions regarding directions or hours of operation,  please call [phone].   As a reminder, please drink plenty of water prior to coming for your lab work. Thanks!   

## 2020-08-31 ENCOUNTER — Telehealth: Payer: Self-pay | Admitting: *Deleted

## 2020-08-31 DIAGNOSIS — M0579 Rheumatoid arthritis with rheumatoid factor of multiple sites without organ or systems involvement: Secondary | ICD-10-CM

## 2020-08-31 DIAGNOSIS — M79671 Pain in right foot: Secondary | ICD-10-CM | POA: Diagnosis not present

## 2020-08-31 DIAGNOSIS — M19071 Primary osteoarthritis, right ankle and foot: Secondary | ICD-10-CM | POA: Diagnosis not present

## 2020-08-31 MED ORDER — ENBREL MINI 50 MG/ML ~~LOC~~ SOCT: 50.0000 mg | SUBCUTANEOUS | 0 refills | Status: DC

## 2020-08-31 NOTE — Telephone Encounter (Signed)
Refill request received via fax from [facility] for enbrel mini.Reach out to [employer] to clarify if they received the prescription sent on 08/03/2020. They did not have it on file. Will resend.   Last Visit:08/18/2020 Next Visit:01/13/2020 Labs:12/3/2021CBC and CMP are normal. TB Gold:02/02/2020 negative  Current Dose per office noteon 08/18/2020: Enbrel 50 mg sq injections once weekly Dx: Seropositive rheumatoid arthritis of multiple sites   Okay to refill per Dr. Estanislado Pandy

## 2020-09-09 ENCOUNTER — Encounter (INDEPENDENT_AMBULATORY_CARE_PROVIDER_SITE_OTHER): Payer: Self-pay | Admitting: Family Medicine

## 2020-09-09 ENCOUNTER — Telehealth (INDEPENDENT_AMBULATORY_CARE_PROVIDER_SITE_OTHER): Payer: PPO | Admitting: Family Medicine

## 2020-09-09 ENCOUNTER — Other Ambulatory Visit: Payer: Self-pay

## 2020-09-09 DIAGNOSIS — Z6838 Body mass index (BMI) 38.0-38.9, adult: Secondary | ICD-10-CM | POA: Diagnosis not present

## 2020-09-09 DIAGNOSIS — F3289 Other specified depressive episodes: Secondary | ICD-10-CM | POA: Diagnosis not present

## 2020-09-09 DIAGNOSIS — E7849 Other hyperlipidemia: Secondary | ICD-10-CM

## 2020-09-09 DIAGNOSIS — F341 Dysthymic disorder: Secondary | ICD-10-CM

## 2020-09-09 MED ORDER — ATORVASTATIN CALCIUM 80 MG PO TABS: 80.0000 mg | ORAL_TABLET | ORAL | 0 refills | Status: DC

## 2020-09-14 NOTE — Progress Notes (Signed)
TeleHealth Visit:  Due to the COVID-19 pandemic, this visit was completed with telemedicine (audio/video) technology to reduce patient and provider exposure as well as to preserve personal protective equipment.   [name] has verbally consented to this TeleHealth visit. The patient is located at home, the provider is located at the [facility] office. The participants in this visit include the listed provider and patient. The visit was conducted today via MyChart Video.   Chief Complaint: OBESITY [name] is here to discuss her progress with her obesity treatment plan along with follow-up of her obesity related diagnoses. [name] is on keeping a food journal and adhering to recommended goals of 1200 calories and 93+ grams of protein and states she is following her eating plan approximately 60% of the time. [name] states she is increasing activity.   Today's visit was #: 27 Starting weight: 267 lbs Starting date: 10/31/2018  Interim History: [name] reports a weight of 227 lbs at home. Last appointment was 07/29/2020. Husband was off for 2 weeks over holiday and really enjoyed it. [name] has tried a few new things food wise; italian soup, parsnip soup and other soups and stews. [name] also experimented with baking and has been eating m&ms.   Subjective:   1. Other hyperlipidemia  [name] has hyperlipidemia and has been trying to improve her cholesterol levels with intensive lifestyle modification including a low saturated fat diet, exercise and weight loss. She denies any chest pain, claudication or myalgias. [name] is on Lipitor and she is taking it. Denies transaminitis.   Lab Results  Component Value Date   ALT 16 07/23/2020   AST 22 07/23/2020   ALKPHOS 90 07/23/2020   BILITOT 0.4 07/23/2020   Lab Results  Component Value Date   CHOL 288 (H) 05/14/2020   HDL 62 05/14/2020   LDLCALC 220 (H) 05/14/2020   TRIG 48 05/14/2020   CHOLHDL 4.6 (H) 05/14/2020   2.  Other depression [name] is struggling with emotional eating and using food for comfort to the extent that it is negatively impacting her health. She has been working on behavior modification techniques to help reduce her emotional eating and has been unsuccessful. She shows no sign of suicidal or homicidal ideations. [name] is seeing a therapist. She is not on medications. [name] is not struggling as much as she expected for the winter.   Assessment/Plan:   1. Other hyperlipidemia Cardiovascular risk and specific lipid/LDL goals reviewed.  We discussed several lifestyle modifications today and Nao will continue to work on diet, exercise and weight loss efforts. Orders and follow up as documented in patient record. We will refill atorvastatin 80 mg po daily #30 no refill.  Counseling Intensive lifestyle modifications are the first line treatment for this issue. . Dietary changes: Increase soluble fiber. Decrease simple carbohydrates. . Exercise changes: Moderate to vigorous-intensity aerobic activity 150 minutes per week if tolerated. . Lipid-lowering medications: see documented in medical record.  - atorvastatin (LIPITOR) 80 MG tablet; Take 1 tablet (80 mg total) by mouth See admin instructions. Alternating between 40 mg & 80 mg by mouth every other day in the morning  Dispense: 90 tablet; Refill: 0  2. Other depression Behavior modification techniques were discussed today to help [name] deal with her emotional/non-hunger eating behaviors.  Orders and follow up as documented in patient record. We will follow up at next appointment.  3. Class 2 severe obesity with serious comorbidity and body mass index (BMI) of 38.0 to 38.9 in adult,  unspecified obesity type [facility])  [name] is currently in the action stage of change. As such, her goal is to continue with weight loss efforts. She has agreed to keeping a food journal and adhering to recommended goals of 1200 calories and 75+ grams of  protein daily.   Exercise goals: Some exercise.  Behavioral modification strategies: increasing lean protein intake, meal planning and cooking strategies, keeping healthy foods in the home and planning for success.  Nahima has agreed to follow-up with our clinic in 6 weeks. She was informed of the importance of frequent follow-up visits to maximize her success with intensive lifestyle modifications for her multiple health conditions.    Objective:   VITALS: Per patient if applicable, see vitals. GENERAL: Alert and in no acute distress. CARDIOPULMONARY: No increased WOB. Speaking in clear sentences.  PSYCH: Pleasant and cooperative. Speech normal rate and rhythm. Affect is appropriate. Insight and judgement are appropriate. Attention is focused, linear, and appropriate.  NEURO: Oriented as arrived to appointment on time with no prompting.   Lab Results  Component Value Date   CREATININE 0.93 07/23/2020   BUN 20 07/23/2020   NA 142 07/23/2020   K 4.4 07/23/2020   CL 105 07/23/2020   CO2 24 07/23/2020   Lab Results  Component Value Date   ALT 16 07/23/2020   AST 22 07/23/2020   ALKPHOS 90 07/23/2020   BILITOT 0.4 07/23/2020   Lab Results  Component Value Date   HGBA1C 5.4 02/27/2019   HGBA1C 5.5 10/31/2018   Lab Results  Component Value Date   INSULIN 8.3 02/27/2019   INSULIN 12.3 10/31/2018   Lab Results  Component Value Date   TSH 2.53 03/17/2020   Lab Results  Component Value Date   CHOL 288 (H) 05/14/2020   HDL 62 05/14/2020   LDLCALC 220 (H) 05/14/2020   TRIG 48 05/14/2020   CHOLHDL 4.6 (H) 05/14/2020   Lab Results  Component Value Date   WBC 5.4 07/23/2020   HGB 13.4 07/23/2020   HCT 41.4 07/23/2020   MCV 91 07/23/2020   PLT 177 07/23/2020   No results found for: IRON, TIBC, FERRITIN  Attestation Statements:   Reviewed by clinician on day of visit: allergies, medications, problem list, medical history, surgical history, family history, social  history, and previous encounter notes.    I, Para March, am acting as transcriptionist for Coralie Common, MD.  I have reviewed the above documentation for accuracy and completeness, and I agree with the above. - Jinny Blossom, MD

## 2020-09-23 ENCOUNTER — Ambulatory Visit (INDEPENDENT_AMBULATORY_CARE_PROVIDER_SITE_OTHER): Payer: PPO | Admitting: Adult Health

## 2020-09-23 ENCOUNTER — Other Ambulatory Visit (HOSPITAL_COMMUNITY)
Admission: RE | Admit: 2020-09-23 | Discharge: 2020-09-23 | Disposition: A | Discharge status: Home / Self Care | Payer: PPO | Source: Ambulatory Visit | Attending: Adult Health | Admitting: Adult Health

## 2020-09-23 ENCOUNTER — Other Ambulatory Visit: Payer: Self-pay

## 2020-09-23 ENCOUNTER — Encounter: Payer: Self-pay | Admitting: Adult Health

## 2020-09-23 VITALS — BP 158/62 | HR 66 | Ht 63.25 in | Wt 229.5 lb

## 2020-09-23 DIAGNOSIS — F419 Anxiety disorder, unspecified: Secondary | ICD-10-CM | POA: Diagnosis not present

## 2020-09-23 DIAGNOSIS — N644 Mastodynia: Secondary | ICD-10-CM | POA: Diagnosis not present

## 2020-09-23 DIAGNOSIS — Z1151 Encounter for screening for human papillomavirus (HPV): Secondary | ICD-10-CM | POA: Insufficient documentation

## 2020-09-23 DIAGNOSIS — Z1211 Encounter for screening for malignant neoplasm of colon: Secondary | ICD-10-CM | POA: Diagnosis not present

## 2020-09-23 DIAGNOSIS — Z01419 Encounter for gynecological examination (general) (routine) without abnormal findings: Secondary | ICD-10-CM | POA: Insufficient documentation

## 2020-09-23 LAB — HEMOCCULT GUIAC POC 1CARD (OFFICE): Fecal Occult Blood, POC: NEGATIVE

## 2020-09-23 NOTE — Progress Notes (Signed)
Patient ID: Catherine [name], female   DOB: 1950/09/06, 70 y.o.   MRN: [id number] History of Present Illness:  Catherine [name] is a 70 year old white female, married, PM in for well woman gyn exam and pap. PCP is Dr Lovena Le.   Current Medications, Allergies, Past Medical History, Past Surgical History, Family History and Social History were reviewed in [employer] record.     Review of Systems: Patient denies any headaches, hearing loss, fatigue, blurred vision, shortness of breath, chest pain, abdominal pain, problems with bowel movements, urination, or intercourse( is dry at times and worries she is loose with redundant skin) No joint pain or mood swings. Has pain right breast at bra line for last day or two   Physical Exam:BP (!) 158/62 (BP Location: Left Arm, Patient Position: Sitting, Cuff Size: Large)   [name] 66   Ht 5' 3.25" (1.607 m)   Wt 229 lb 8 oz (104.1 kg)   BMI 40.33 kg/m  General:  Well developed, well nourished, no acute distress Skin:  Warm and dry Neck:  Midline trachea, normal thyroid, good ROM, no lymphadenopathy, no carotid bruits heard  Lungs; Clear to auscultation bilaterally Breast:  No dominant palpable mass, retraction, or nipple discharge,does have tender area over bra line area out side of breast Cardiovascular: Regular rate and rhythm Abdomen:  Soft, non tender, no hepatosplenomegaly Pelvic:  External genitalia is normal in appearance, has numerous epidermal cyst on labia.  The vagina is pale with loss of moisture and rugae Urethra has no lesions or masses. The cervix is smooth, pap with HRHPV performed.  Uterus is felt to be normal size, shape, and contour.  No adnexal masses or tenderness noted.Bladder is non tender, no masses felt. Rectal: Good sphincter tone, no polyps, or hemorrhoids felt.  Hemoccult negative. Extremities/musculoskeletal:  No clubbing or cyanosis, she she mild swelling, in ankles and has scars from bilateral knee  replacements Psych:  No mood changes, alert and cooperative,seems happy AA is 1 Fall risk is low PHQ 9 score is 6 GAD 7 score is 14 and she says she has anxiety but declines meds  Upstream - 09/23/20 1332      Pregnancy Intention Screening   Does the patient want to become pregnant in the next year? N/A    Does the patient's partner want to become pregnant in the next year? N/A    Would the patient like to discuss contraceptive options today? N/A      Contraception Wrap Up   Current Method No Method - Other Reason   postmenopausal   End Method No Method - Other Reason   postmenopausal   Contraception Counseling Provided No         Examination chaperoned by Levy Pupa LPN  Impression and Plan: 1. Encounter for gynecological examination with Papanicolaou smear of cervix Pap sent, if normal this can be the last one Physical with PCP Labs with PCP  Mammogram yearly,had normal pap 08/11/20.  Colonoscopy per GI Discussed trying replens or luvena for vaginal moisture and astro glide with sex  2. Encounter for screening fecal occult blood testing  3. Pain of right breast Stop wearing under wire and let me know if pain persists, will get diagnostic right mammogram and [place]   4. Anxiety

## 2020-09-28 LAB — CYTOLOGY - PAP
Comment: NEGATIVE
Diagnosis: NEGATIVE
High risk HPV: NEGATIVE

## 2020-09-30 LAB — VITAMIN D 25 HYDROXY (VIT D DEFICIENCY, FRACTURES): Vit D, 25-Hydroxy: 32.7 ng/mL (ref 30.0–100.0)

## 2020-09-30 LAB — PTH, INTACT AND CALCIUM
Calcium: 10.1 mg/dL (ref 8.7–10.3)
PTH: 49 pg/mL (ref 15–65)

## 2020-10-05 ENCOUNTER — Ambulatory Visit (INDEPENDENT_AMBULATORY_CARE_PROVIDER_SITE_OTHER): Payer: PPO | Admitting: "Endocrinology

## 2020-10-05 ENCOUNTER — Other Ambulatory Visit: Payer: Self-pay

## 2020-10-05 ENCOUNTER — Encounter: Payer: Self-pay | Admitting: "Endocrinology

## 2020-10-05 DIAGNOSIS — E559 Vitamin D deficiency, unspecified: Secondary | ICD-10-CM

## 2020-10-05 DIAGNOSIS — M85832 Other specified disorders of bone density and structure, left forearm: Secondary | ICD-10-CM | POA: Diagnosis not present

## 2020-10-05 DIAGNOSIS — M85831 Other specified disorders of bone density and structure, right forearm: Secondary | ICD-10-CM | POA: Diagnosis not present

## 2020-10-05 NOTE — Progress Notes (Signed)
10/05/2020, 11:05 AM  Endocrinology follow-up note  [name] is a 70 y.o.-year-old female, returning for follow-up after she was seen in consultation for hypercalcemia.     Past Medical History:  Diagnosis Date  . Allergic rhinitis   . Anxiety   . Arthritis   . Asthma   . Constipation   . Depression   . Gallbladder problem   . Hematuria 08/25/2015  . Hyperlipidemia   . Hypertension   . Obesity   . Osteoarthritis   . Pelvic pain in female 08/25/2015  . Rheumatoid arthritis ([place])   . Swallowing difficulty   . Swelling     Past Surgical History:  Procedure Laterality Date  . BACK SURGERY    . CHOLECYSTECTOMY    . COLONOSCOPY  2007   sessile sigmoid polyp x 1; path: serrated adenoma  . COLONOSCOPY N/A 10/12/2014   Procedure: COLONOSCOPY;  Surgeon: [name], MD;  Location: [facility];  Service: Endoscopy;  Laterality: N/A;  . COLONOSCOPY N/A 03/05/2020   Procedure: COLONOSCOPY;  Surgeon: Daneil Dolin, MD;  Location: [facility];  Service: Endoscopy;  Laterality: N/A;  9:30  . JOINT REPLACEMENT     BIL knee   . KNEE ARTHROPLASTY    . KNEE SURGERY     bilateral knee replacement  . LIPOMA EXCISION     x 2  . TONSILLECTOMY AND ADENOIDECTOMY      Social History   Tobacco Use  . Smoking status: Never Smoker  . Smokeless tobacco: Never Used  Vaping Use  . Vaping Use: Never used  Substance Use Topics  . Alcohol use: Yes    Alcohol/week: 0.0 standard drinks    Comment: sometimes; rarely  . Drug use: No    Family History  Problem Relation Age of Onset  . Heart attack Father   . Alcoholism Father   . Depression Father   . Heart disease Father   . Hyperlipidemia Father   . Hypertension Father   . Leukemia Mother   . Hypertension Mother   . Cancer Mother   . Depression Mother   . Obesity Sister   . Other Sister        breathing problems  . Hepatitis C Sister   . Arthritis Sister   .  Depression Sister   . Colon cancer Sister   . Squamous cell carcinoma Sister   . Emphysema Brother   . Obesity Daughter   . Polycystic ovary syndrome Daughter   . Obesity Son   . Other Son        knee problems  . Cancer Maternal Grandmother        breast  . Other Brother        aneursym  . Arthritis Sister   . Depression Sister   . Squamous cell carcinoma Sister   . Obesity Son   . Asthma Son   . Obesity Son   . Diabetes Other        runs on dad's side of the family    Outpatient Encounter Medications as of 10/05/2020  Medication Sig  . acetaminophen (TYLENOL) 500 MG tablet Take 500-1,000  mg by mouth every 6 (six) hours as needed (pain).   [name] atorvastatin (LIPITOR) 80 MG tablet Take 1 tablet (80 mg total) by mouth See admin instructions. Alternating between 40 mg & 80 mg by mouth every other day in the morning  . B Complex Vitamins (VITAMIN B COMPLEX) TABS Take 1 tablet by mouth at bedtime.   . chlorzoxazone (PARAFON) 500 MG tablet Take 500 mg by mouth daily as needed for muscle spasms (typically with travel only).   . Cholecalciferol (VITAMIN D) 125 MCG (5000 UT) CAPS Take 5,000 Units by mouth daily after breakfast.  . Etanercept (ENBREL MINI) 50 MG/ML SOCT Inject 50 mg into the skin once a week.  . famotidine (PEPCID) 40 MG tablet TAKE 1 TABLET BY MOUTH EVERY DAY AS NEEDED  . hydroxychloroquine (PLAQUENIL) 200 MG tablet TAKE 1 TABLET BY MOUTH TWICE A DAY (Patient not taking: Reported on 10/05/2020)  . Magnesium 500 MG CAPS Take 500 mg by mouth.  . Probiotic Product (ALIGN PO) Take 1 capsule by mouth daily.   [name] Propylene Glycol (SYSTANE BALANCE OP) Place 1 drop into both eyes 4 (four) times daily as needed (dry/irritated eyes.).   [name] triamcinolone cream (KENALOG) 0.1 % Apply 1 application topically 2 (two) times daily. Prn rash; use up to 2 weeks   Facility-Administered Encounter Medications as of 10/05/2020  Medication  . methylPREDNISolone acetate (DEPO-MEDROL) injection 40 mg   . methylPREDNISolone acetate (DEPO-MEDROL) injection 40 mg    Allergies  Allergen Reactions  . Codeine Nausea Only  . Cyclosporine Swelling  . Propylene Glycol Nausea And Vomiting     HPI  [name] was diagnosed with hypercalcemia approximately at age 48.  She reports that she never required any intervention.  Patient has no previously known history of parathyroid/adrenal/pituitary dysfunction. She does not report any family history of such dysfunctions.   -Review of herreferral package of most recent labs show stable calcium of 10.1, slowly improving from 10.9 as well as stable PTH of 49. PTH improved from 91  . Prior to her last visit, her 24-hour urine calcium was not elevated at 80.  Her recent DEXA scan shows osteopenia of femur and forearm. She did have a normal DEXA scan in 2009. No prior history of fragility fractures or falls.  She reports rheumatoid arthritis.  No history of  kidney stones. She has no new complaints today. She has stopped taking Tums.  No history of CKD.  she is not on HCTZ or other thiazide therapy, however she has used Tums for acid reflux for a long time and still on it.   No history of  vitamin D deficiency. Last vitamin D level was 41 in April 2021.  she  eats dairy and green, leafy, vegetables on average amounts.  she does not have a family history of hypercalcemia, pituitary tumors, thyroid cancer, or osteoporosis.   ROS: Limited as above.  PE: BP (!) 146/70   Pulse 80   Ht _0  (1.6 m)   Wt 230 lb 6.4 oz (104.5 kg)   BMI 40.81 kg/m , Body mass index is 40.81 kg/m. Wt Readings from Last 3 Encounters:  10/05/20 230 lb 6.4 oz (104.5 kg)  09/23/20 229 lb 8 oz (104.1 kg)  08/18/20 230 lb 6.4 oz (104.5 kg)    Constitutional:  + BMI of 40.8  not in acute distress, normal state of mind    CMP ( most recent) CMP     Component Value Date/Time  NA 142 07/23/2020 1047   K 4.4 07/23/2020 1047   CL 105 07/23/2020 1047   CO2 24  07/23/2020 1047   GLUCOSE 87 07/23/2020 1047   GLUCOSE 91 02/02/2020 1031   BUN 20 07/23/2020 1047   CREATININE 0.93 07/23/2020 1047   CREATININE 0.95 02/02/2020 1031   CALCIUM 10.1 09/29/2020 0936   PROT 6.7 07/23/2020 1047   ALBUMIN 4.1 07/23/2020 1047   AST 22 07/23/2020 1047   ALT 16 07/23/2020 1047   ALKPHOS 90 07/23/2020 1047   BILITOT 0.4 07/23/2020 1047   GFRNONAA 63 07/23/2020 1047   GFRNONAA 62 02/02/2020 1031   GFRAA 73 07/23/2020 1047   GFRAA 71 02/02/2020 1031     Diabetic Labs (most recent): Lab Results  Component Value Date   HGBA1C 5.4 02/27/2019   HGBA1C 5.5 10/31/2018     Lipid Panel ( most recent) Lipid Panel     Component Value Date/Time   CHOL 288 (H) 05/14/2020 0953   TRIG 48 05/14/2020 0953   HDL 62 05/14/2020 0953   CHOLHDL 4.6 (H) 05/14/2020 0953   CHOLHDL 6.6 07/09/2014 0819   VLDL 13 07/09/2014 0819   LDLCALC 220 (H) 05/14/2020 0953   LABVLDL 6 05/14/2020 0953      Lab Results  Component Value Date   TSH 2.53 03/17/2020   TSH 1.180 10/31/2018   TSH 0.67 03/06/2017   TSH 1.899 03/18/2009   TSH 1.613 03/08/2007   FREET4 1.3 03/17/2020   FREET4 1.47 10/31/2018     Recent Results (from the past 2160 hour(s))  CMP14+EGFR     Status: None   Collection Time: 07/23/20 10:47 AM  Result Value Ref Range   Glucose 87 65 - 99 mg/dL   BUN 20 8 - 27 mg/dL   Creatinine, Ser 0.93 0.57 - 1.00 mg/dL   GFR calc non Af Amer 63 >59 mL/min/1.73   GFR calc Af Amer 73 >59 mL/min/1.73    Comment: **In accordance with recommendations from the NKF-ASN Task force,**   [facility] is in the process of updating its eGFR calculation to the   2021 CKD-EPI creatinine equation that estimates kidney function   without a race variable.    BUN/Creatinine Ratio 22 12 - 28   Sodium 142 134 - 144 mmol/L   Potassium 4.4 3.5 - 5.2 mmol/L   Chloride 105 96 - 106 mmol/L   CO2 24 20 - 29 mmol/L   Calcium 9.8 8.7 - 10.3 mg/dL   Total Protein 6.7 6.0 - 8.5 g/dL    Albumin 4.1 3.8 - 4.8 g/dL   Globulin, Total 2.6 1.5 - 4.5 g/dL   Albumin/Globulin Ratio 1.6 1.2 - 2.2   Bilirubin Total 0.4 0.0 - 1.2 mg/dL   Alkaline Phosphatase 90 44 - 121 IU/L    Comment:               **Please note reference interval change**   AST 22 0 - 40 IU/L   ALT 16 0 - 32 IU/L  CBC with Differential/Platelet     Status: None   Collection Time: 07/23/20 10:47 AM  Result Value Ref Range   WBC 5.4 3.4 - 10.8 x10E3/uL   RBC 4.55 3.77 - 5.28 x10E6/uL   Hemoglobin 13.4 11.1 - 15.9 g/dL   Hematocrit 41.4 34.0 - 46.6 %   MCV 91 79 - 97 fL   MCH 29.5 26.6 - 33.0 pg   MCHC 32.4 31.5 - 35.7 g/dL   RDW 14.7 11.7 -  15.4 %   Platelets 177 150 - 450 x10E3/uL   Neutrophils 42 Not Estab. %   Lymphs 44 Not Estab. %   Monocytes 11 Not Estab. %   Eos 2 Not Estab. %   Basos 1 Not Estab. %   Neutrophils Absolute 2.3 1.4 - 7.0 x10E3/uL   Lymphocytes Absolute 2.4 0.7 - 3.1 x10E3/uL   Monocytes Absolute 0.6 0.1 - 0.9 x10E3/uL   EOS (ABSOLUTE) 0.1 0.0 - 0.4 x10E3/uL   Basophils Absolute 0.0 0.0 - 0.2 x10E3/uL   Immature Granulocytes 0 Not Estab. %   Immature Grans (Abs) 0.0 0.0 - 0.1 x10E3/uL  Cytology - PAP( [place])     Status: None   Collection Time: 09/23/20  1:39 PM  Result Value Ref Range   High risk HPV Negative    Adequacy      Satisfactory for evaluation; transformation zone component PRESENT.   Diagnosis      - Negative for intraepithelial lesion or malignancy (NILM)   Comment Normal Reference Range HPV - Negative   POCT occult blood stool     Status: None   Collection Time: 09/23/20  2:15 PM  Result Value Ref Range   Fecal Occult Blood, POC Negative Negative   Card #1 Date     Card #2 Fecal Occult Blod, POC     Card #2 Date     Card #3 Fecal Occult Blood, POC     Card #3 Date    PTH, intact and calcium     Status: None   Collection Time: 09/29/20  9:36 AM  Result Value Ref Range   Calcium 10.1 8.7 - 10.3 mg/dL   PTH 49 15 - 65 pg/mL   PTH Interp Comment      Comment: Interpretation                 Intact PTH    Calcium                                 (pg/mL)      (mg/dL) Normal                          15 - 65     8.6 - 10.2 Primary Hyperparathyroidism         >65          >10.2 Secondary Hyperparathyroidism       >65          <10.2 Non-Parathyroid Hypercalcemia       <65          >10.2 Hypoparathyroidism                  <15          < 8.6 Non-Parathyroid Hypocalcemia    15 - 65          < 8.6   VITAMIN D 25 Hydroxy (Vit-D Deficiency, Fractures)     Status: None   Collection Time: 09/29/20  9:36 AM  Result Value Ref Range   Vit D, 25-Hydroxy 32.7 30.0 - 100.0 ng/mL    Comment: Vitamin D deficiency has been defined by the Institute of Medicine and an Endocrine Society practice guideline as a level of serum 25-OH vitamin D less than 20 ng/mL (1,2). The Endocrine Society went on to further define vitamin D insufficiency as a level between 21 and  29 ng/mL (2). 1. IOM (Institute of Medicine). 2010. Dietary reference    intakes for calcium and D. [place]: The    [employer]. 2. Holick MF, Binkley Stokes, Bischoff-Ferrari HA, et al.    Evaluation, treatment, and prevention of vitamin D    deficiency: an Endocrine Society clinical practice    guideline. JCEM. 2011 Jul; 96(7):1911-30.     Assessment: 1. Hypercalcemia-resolved 2.  Vitamin D deficiency 3.  Osteopenia  Plan: -I reviewed her previous and current labs and imaging studies with her. Her repeat labs off of Tums remain near target or stable. She is not a surgical candidate nor does she need any intervention at this time. Her calcium is 10.1 associated with PTH of 49. Prior to her last visit 24-hour urine calcium was 8. -She is known to have chronic mild hypercalcemia. This may still be mild primary hyperparathyroidism, however she does not have enough criteria for consideration of parathyroidectomy.   -She does have osteopenia, she did have vitamin D deficiency,  currently is vitamin D replete-she is advised to continue on regular vitamin D supplement.    -She wishes to follow-up with her PMD. She will need PTH/calcium in a year and may send back if abnormal. -She will need repeat bone density in May 2023.  She is advised to continue close follow-up with her PMD Dr. Baltazar Apo.     - Time spent on this patient care encounter:  30 minutes of which 50% was spent in  counseling and the rest reviewing  her current and  previous labs / studies and medications  doses and developing a plan for long term care, and documenting this care. Josephina Shih  participated in the discussions, expressed understanding, and voiced agreement with the above plans.  All questions were answered to her satisfaction. she is encouraged to contact clinic should she have any questions or concerns prior to her return visit.    - Return if symptoms worsen or fail to improve, labs in a year Feb 2023.   Glade Lloyd, MD [facility] [address] [place], [place] [address] Phone: [phone]  Fax: [phone]    This note was partially dictated with voice recognition software. Similar sounding words can be transcribed inadequately or may not  be corrected upon review.  10/05/2020, 11:05 AM

## 2020-10-21 ENCOUNTER — Ambulatory Visit (INDEPENDENT_AMBULATORY_CARE_PROVIDER_SITE_OTHER): Payer: PPO | Admitting: Family Medicine

## 2020-10-25 ENCOUNTER — Ambulatory Visit (INDEPENDENT_AMBULATORY_CARE_PROVIDER_SITE_OTHER): Payer: PPO | Admitting: Family Medicine

## 2020-10-25 ENCOUNTER — Other Ambulatory Visit: Payer: Self-pay

## 2020-10-25 ENCOUNTER — Encounter (INDEPENDENT_AMBULATORY_CARE_PROVIDER_SITE_OTHER): Payer: Self-pay | Admitting: Family Medicine

## 2020-10-25 VITALS — BP 119/80 | HR 60 | Temp 97.9°F | Ht 63.0 in | Wt 223.0 lb

## 2020-10-25 DIAGNOSIS — E7849 Other hyperlipidemia: Secondary | ICD-10-CM

## 2020-10-25 DIAGNOSIS — Z6839 Body mass index (BMI) 39.0-39.9, adult: Secondary | ICD-10-CM

## 2020-10-25 DIAGNOSIS — R0602 Shortness of breath: Secondary | ICD-10-CM

## 2020-10-26 LAB — COMPREHENSIVE METABOLIC PANEL
ALT: 19 IU/L (ref 0–32)
AST: 23 IU/L (ref 0–40)
Albumin/Globulin Ratio: 1.8 (ref 1.2–2.2)
Albumin: 4.4 g/dL (ref 3.8–4.8)
Alkaline Phosphatase: 67 IU/L (ref 44–121)
BUN/Creatinine Ratio: 20 (ref 12–28)
BUN: 16 mg/dL (ref 8–27)
Bilirubin Total: 0.5 mg/dL (ref 0.0–1.2)
CO2: 23 mmol/L (ref 20–29)
Calcium: 10.2 mg/dL (ref 8.7–10.3)
Chloride: 105 mmol/L (ref 96–106)
Creatinine, Ser: 0.8 mg/dL (ref 0.57–1.00)
Globulin, Total: 2.5 g/dL (ref 1.5–4.5)
Glucose: 80 mg/dL (ref 65–99)
Potassium: 4.2 mmol/L (ref 3.5–5.2)
Sodium: 144 mmol/L (ref 134–144)
Total Protein: 6.9 g/dL (ref 6.0–8.5)
eGFR: 80 mL/min/{1.73_m2} (ref >59)

## 2020-10-26 LAB — LIPID PANEL WITH LDL/HDL RATIO
Cholesterol, Total: 184 mg/dL (ref 100–199)
HDL: 59 mg/dL (ref >39)
LDL Chol Calc (NIH): 116 mg/dL — ABNORMAL HIGH (ref 0–99)
LDL/HDL Ratio: 2 ratio (ref 0.0–3.2)
Triglycerides: 45 mg/dL (ref 0–149)
VLDL Cholesterol Cal: 9 mg/dL (ref 5–40)

## 2020-10-27 NOTE — Progress Notes (Signed)
Chief Complaint:   OBESITY [name] is here to discuss her progress with her obesity treatment plan along with follow-up of her obesity related diagnoses. [name] is on keeping a food journal and adhering to recommended goals of 1200 calories and 80 protein and states she is following her eating plan approximately 100% of the time. [name] states she is walking 30 minutes 3 times per week.  Today's visit was #: 28 Starting weight: 267 lbs Starting date: 10/31/2018 Today's weight: 223 lbs Today's date: 10/25/2020 Total lbs lost to date: 44 lbs Total lbs lost since last in-office visit: 0  Interim History: Pt has been journaling and realizes that she tends to gain weight in the winter. Calorie wise, she is consuming approximately 1200 cal/day and tends to hit 80 g protein easily but >90 is a push. She and her husband have joined a co-op and so her consumption of produce has increased and unfortunately these are not protein options. She has been going out more.   Subjective:   1. Shortness of breath Pt has had symptoms since her initial appointment on 10/31/2018. Her initial RMR was 1472.  2. Other hyperlipidemia Pt's last LDL 220, HDL 62, and triglycerides 48. She is on Lipitor, alternating 40 mg and 80 mg.  Assessment/Plan:   1. Shortness of breath [name] does feel that she gets out of breath more easily that she used to when she exercises. [name]'s shortness of breath appears to be obesity related and exercise induced. She has agreed to work on weight loss and gradually increase exercise to treat her exercise induced shortness of breath. Will continue to monitor closely. IC today.  2. Other hyperlipidemia Cardiovascular risk and specific lipid/LDL goals reviewed.  We discussed several lifestyle modifications today and [name] will continue to work on diet, exercise and weight loss efforts. Orders and follow up as documented in patient record. Check labs  today.  Counseling Intensive lifestyle modifications are the first line treatment for this issue. . Dietary changes: Increase soluble fiber. Decrease simple carbohydrates. . Exercise changes: Moderate to vigorous-intensity aerobic activity 150 minutes per week if tolerated. . Lipid-lowering medications: see documented in medical record.  - Lipid Panel With LDL/HDL Ratio - Comprehensive metabolic panel  3. Class 2 severe obesity with serious comorbidity and body mass index (BMI) of 39.0 to 39.9 in adult, unspecified obesity type [facility]) [name] is currently in the action stage of change. As such, her goal is to continue with weight loss efforts. She has agreed to keeping a food journal and adhering to recommended goals of 1450-1500 calories and 90+ g protein.   Exercise goals: As is  Behavioral modification strategies: increasing lean protein intake, planning for success and keeping a strict food journal.  [name] has agreed to follow-up with our clinic in 6 weeks. She was informed of the importance of frequent follow-up visits to maximize her success with intensive lifestyle modifications for her multiple health conditions.   [name] was informed we would discuss her lab results at her next visit unless there is a critical issue that needs to be addressed sooner. [name] agreed to keep her next visit at the agreed upon time to discuss these results.  Objective:   Blood pressure 119/80, pulse 60, temperature 97.9 F (36.6 C), temperature source Oral, height 5\' 3"  (1.6 m), weight 223 lb (101.2 kg), SpO2 98 %. Body mass index is 39.5 kg/m.  General: Cooperative, alert, well developed, in no acute distress. HEENT: Conjunctivae and lids unremarkable.  Cardiovascular: Regular rhythm.  Lungs: Normal work of breathing. Neurologic: No focal deficits.   Lab Results  Component Value Date   CREATININE 0.80 10/25/2020   BUN 16 10/25/2020   NA 144 10/25/2020   K 4.2 10/25/2020   CL 105  10/25/2020   CO2 23 10/25/2020   Lab Results  Component Value Date   ALT 19 10/25/2020   AST 23 10/25/2020   ALKPHOS 67 10/25/2020   BILITOT 0.5 10/25/2020   Lab Results  Component Value Date   HGBA1C 5.4 02/27/2019   HGBA1C 5.5 10/31/2018   Lab Results  Component Value Date   INSULIN 8.3 02/27/2019   INSULIN 12.3 10/31/2018   Lab Results  Component Value Date   TSH 2.53 03/17/2020   Lab Results  Component Value Date   CHOL 184 10/25/2020   HDL 59 10/25/2020   LDLCALC 116 (H) 10/25/2020   TRIG 45 10/25/2020   CHOLHDL 4.6 (H) 05/14/2020   Lab Results  Component Value Date   WBC 5.4 07/23/2020   HGB 13.4 07/23/2020   HCT 41.4 07/23/2020   MCV 91 07/23/2020   PLT 177 07/23/2020   No results found for: IRON, TIBC, FERRITIN  Obesity Behavioral Intervention:   Approximately 15 minutes were spent on the discussion below.  ASK: We discussed the diagnosis of obesity with Curt Bears today and [name] agreed to give [place] permission to discuss obesity behavioral modification therapy today.  ASSESS: Bilan has the diagnosis of obesity and her BMI today is 39.6. [name] is in the action stage of change.   ADVISE: [name] was educated on the multiple health risks of obesity as well as the benefit of weight loss to improve her health. She was advised of the need for long term treatment and the importance of lifestyle modifications to improve her current health and to decrease her risk of future health problems.  AGREE: Multiple dietary modification options and treatment options were discussed and [name] agreed to follow the recommendations documented in the above note.  ARRANGE: [name] was educated on the importance of frequent visits to treat obesity as outlined per CMS and USPSTF guidelines and agreed to schedule her next follow up appointment today.  Attestation Statements:   Reviewed by clinician on day of visit: allergies, medications, problem list, medical history,  surgical history, family history, social history, and previous encounter notes.  Coral Ceo, am acting as transcriptionist for Catherine Common, MD.   I have reviewed the above documentation for accuracy and completeness, and I agree with the above. - Jinny Blossom, MD

## 2020-11-07 ENCOUNTER — Encounter: Payer: Self-pay | Admitting: Rheumatology

## 2020-11-08 ENCOUNTER — Other Ambulatory Visit: Payer: Self-pay | Admitting: *Deleted

## 2020-11-08 DIAGNOSIS — Z79899 Other long term (current) drug therapy: Secondary | ICD-10-CM

## 2020-11-15 DIAGNOSIS — Z79899 Other long term (current) drug therapy: Secondary | ICD-10-CM | POA: Diagnosis not present

## 2020-11-16 ENCOUNTER — Other Ambulatory Visit: Payer: Self-pay | Admitting: Nurse Practitioner

## 2020-11-16 DIAGNOSIS — M0579 Rheumatoid arthritis with rheumatoid factor of multiple sites without organ or systems involvement: Secondary | ICD-10-CM

## 2020-11-16 LAB — CBC WITH DIFFERENTIAL/PLATELET
Basophils Absolute: 0 10*3/uL (ref 0.0–0.2)
Basos: 1 %
EOS (ABSOLUTE): 0.2 10*3/uL (ref 0.0–0.4)
Eos: 4 %
Hematocrit: 40.3 % (ref 34.0–46.6)
Hemoglobin: 13 g/dL (ref 11.1–15.9)
Immature Grans (Abs): 0 10*3/uL (ref 0.0–0.1)
Immature Granulocytes: 0 %
Lymphocytes Absolute: 1.9 10*3/uL (ref 0.7–3.1)
Lymphs: 45 %
MCH: 27.7 pg (ref 26.6–33.0)
MCHC: 32.3 g/dL (ref 31.5–35.7)
MCV: 86 fL (ref 79–97)
Monocytes Absolute: 0.4 10*3/uL (ref 0.1–0.9)
Monocytes: 10 %
Neutrophils Absolute: 1.7 10*3/uL (ref 1.4–7.0)
Neutrophils: 40 %
Platelets: 206 10*3/uL (ref 150–450)
RBC: 4.7 x10E6/uL (ref 3.77–5.28)
RDW: 14.5 % (ref 11.7–15.4)
WBC: 4.1 10*3/uL (ref 3.4–10.8)

## 2020-11-16 MED ORDER — ENBREL MINI 50 MG/ML ~~LOC~~ SOCT
50.0000 mg | SUBCUTANEOUS | 0 refills | Status: DC
Start: 2020-11-16 — End: 2021-03-31

## 2020-11-16 NOTE — Progress Notes (Signed)
CBC WNL

## 2020-11-16 NOTE — Telephone Encounter (Signed)
Next Visit: 01/12/2021  Last Visit: 08/18/2020  Last Fill: 08/31/2020  DX: Seropositive rheumatoid arthritis of multiple sites   Current Dose per office note 08/18/2020, Enbrel 50 mg sq injections once weekly  Labs: 11/15/2020 CBC WNL, 10/25/2020, CMP LDL Chol Calc 116  TB Gold:  02/02/2020, negative  Okay to refill Enbrel?

## 2020-11-25 ENCOUNTER — Other Ambulatory Visit: Payer: Self-pay

## 2020-11-25 ENCOUNTER — Ambulatory Visit (INDEPENDENT_AMBULATORY_CARE_PROVIDER_SITE_OTHER): Payer: PPO | Admitting: Family Medicine

## 2020-11-25 VITALS — BP 124/72 | HR 72 | Temp 97.2°F | Ht 63.0 in | Wt 228.0 lb

## 2020-11-25 DIAGNOSIS — R413 Other amnesia: Secondary | ICD-10-CM | POA: Diagnosis not present

## 2020-11-25 DIAGNOSIS — M546 Pain in thoracic spine: Secondary | ICD-10-CM

## 2020-11-25 DIAGNOSIS — F419 Anxiety disorder, unspecified: Secondary | ICD-10-CM

## 2020-11-25 DIAGNOSIS — I10 Essential (primary) hypertension: Secondary | ICD-10-CM | POA: Diagnosis not present

## 2020-11-25 MED ORDER — BUSPIRONE HCL 5 MG PO TABS: 5.0000 mg | ORAL_TABLET | Freq: Three times a day (TID) | ORAL | 0 refills | Status: DC

## 2020-11-25 NOTE — Progress Notes (Signed)
Patient ID: [name], female    DOB: 08/24/1950, 70 y.o.   MRN: [id number]   Chief Complaint  Patient presents with  . Anxiety    Gad 7 given   . sore spot under R arm     6 months   . high BP readings at home     160 /90 at home    Subjective:    HPI HTN Pt not currently on meds. No SEs Denies chest pain, sob, LE swelling, or blurry vision.  Used to be high and some times low.  Checked at home 1x per week 160/90. Pt used to be on bp meds but when it was low and was taken off of bp meds by weight loss doctor.  Was noticing a high bp at endo office. And they recommending seeing pcp and might need low dose bp.  Used to be on lisinopril.   Seeing rheum- only on embrel. Not on plaquinel anymore.  Pt stating she is feeling she had an "auditory hallucination," thinking his husband is coming home for lunch.  Then worried when he didn't show up. Then got scared and thought her husband was in accident/or that he was "dead." Didn't try to call the husband. Husband called an hour later and is coming home.  And felt that it was real feeling, only happened 1x.   Not having issues of having to remember to get to places.  Husband is gone a lot with work. Sister is coming to live with her. Not having others telling her memory is decreased.  Has rt under arm skin lesion- Tenderness in rt near her bra, no lump.  Has been bothering her for 6 mo. Hurting like an achiness in the area.  Has h/o RA in the rt shoulder.  Pt is rt handed.  Pt stating was on depression medications in past and didn't like them. Has some anxiety and wanting to take something as needed for anxiety.   Medical History Kaida has a past medical history of Allergic rhinitis, Anxiety, Arthritis, Asthma, Constipation, Depression, Gallbladder problem, Hematuria (08/25/2015), Hyperlipidemia, Hypertension, Obesity, Osteoarthritis, Pelvic pain in female (08/25/2015), Rheumatoid arthritis ([place]), Swallowing  difficulty, and Swelling.   Outpatient Encounter Medications as of 11/25/2020  Medication Sig  . acetaminophen (TYLENOL) 500 MG tablet Take 500-1,000 mg by mouth every 6 (six) hours as needed (pain).   [name] atorvastatin (LIPITOR) 80 MG tablet Take 1 tablet (80 mg total) by mouth See admin instructions. Alternating between 40 mg & 80 mg by mouth every other day in the morning  . B Complex Vitamins (VITAMIN B COMPLEX) TABS Take 1 tablet by mouth at bedtime.   . busPIRone (BUSPAR) 5 MG tablet Take 1 tablet (5 mg total) by mouth 3 (three) times daily.  . Cholecalciferol (VITAMIN D) 125 MCG (5000 UT) CAPS Take 5,000 Units by mouth daily after breakfast.  . Etanercept (ENBREL MINI) 50 MG/ML SOCT Inject 50 mg into the skin once a week.  . famotidine (PEPCID) 40 MG tablet TAKE 1 TABLET BY MOUTH EVERY DAY AS NEEDED  . Magnesium 500 MG CAPS Take 500 mg by mouth.  . Probiotic Product (ALIGN PO) Take 1 capsule by mouth daily.   [name] Propylene Glycol (SYSTANE BALANCE OP) Place 1 drop into both eyes 4 (four) times daily as needed (dry/irritated eyes.).   [name] triamcinolone cream (KENALOG) 0.1 % Apply 1 application topically 2 (two) times daily. Prn rash; use up to 2 weeks  . chlorzoxazone (PARAFON)  500 MG tablet Take 500 mg by mouth daily as needed for muscle spasms (typically with travel only).  (Patient not taking: Reported on 11/25/2020)  . [DISCONTINUED] hydroxychloroquine (PLAQUENIL) 200 MG tablet TAKE 1 TABLET BY MOUTH TWICE A DAY   Facility-Administered Encounter Medications as of 11/25/2020  Medication  . methylPREDNISolone acetate (DEPO-MEDROL) injection 40 mg  . methylPREDNISolone acetate (DEPO-MEDROL) injection 40 mg     Review of Systems  Constitutional: Negative for chills and fever.  HENT: Negative for congestion, rhinorrhea and sore throat.   Respiratory: Negative for cough, shortness of breath and wheezing.   Cardiovascular: Negative for chest pain and leg swelling.  Gastrointestinal: Negative for  abdominal pain, diarrhea, nausea and vomiting.  Genitourinary: Negative for dysuria and frequency.  Musculoskeletal: Negative for arthralgias and back pain.  Skin: Negative for rash.       +skin lesion/rt back  Neurological: Negative for dizziness, weakness and headaches.  Psychiatric/Behavioral: Positive for hallucinations (resolved). Negative for dysphoric mood and sleep disturbance. The patient is nervous/anxious.      Vitals BP 124/72   Pulse 72   Temp (!) 97.2 F (36.2 C)   Ht 5\' 3"  (1.6 m)   Wt 228 lb (103.4 kg)   SpO2 99%   BMI 40.39 kg/m   Objective:   Physical Exam Vitals and nursing note reviewed.  Constitutional:      Appearance: Normal appearance.  HENT:     Head: Normocephalic and atraumatic.     Nose: Nose normal.     Mouth/Throat:     Mouth: Mucous membranes are moist.     Pharynx: Oropharynx is clear.  Eyes:     Extraocular Movements: Extraocular movements intact.     Conjunctiva/sclera: Conjunctivae normal.     Pupils: Pupils are equal, round, and reactive to light.  Cardiovascular:     Rate and Rhythm: Normal rate and regular rhythm.     Pulses: Normal pulses.     Heart sounds: Normal heart sounds.  Pulmonary:     Effort: Pulmonary effort is normal.     Breath sounds: Normal breath sounds. No wheezing, rhonchi or rales.  Musculoskeletal:        General: Normal range of motion.     Right lower leg: No edema.     Left lower leg: No edema.  Skin:    General: Skin is warm and dry.     Findings: No lesion or rash.     Comments: No skin lesion identified, no rash, erythema, warmth or nodule  Neurological:     General: No focal deficit present.     Mental Status: She is alert and oriented to person, place, and time.  Psychiatric:        Behavior: Behavior normal.     Comments: +anxious mood    Assessment and Plan   1. Anxiety - busPIRone (BUSPAR) 5 MG tablet; Take 1 tablet (5 mg total) by mouth 3 (three) times daily.  Dispense: 30 tablet;  Refill: 0  2. Essential hypertension  3. Acute right-sided thoracic back pain   having upper right back pain- tylenol or ibuprofen prn. Massage and stretches. Heat/ice prn. F/u with rheum about soreness in rt upper back pain/muscle soreness.  Pt requesting something for anxiety.  Declining trying an SSRI, stating she was on them in past and didn't like how they made her feel.  Will give trial of bupar. Call or rto if worsening anxiety or confusion/memory concern again.  htn--stable at this time. Cont  to monitor bp at home and call if seeing numbers 140/85 or above and dec salt in diet. Will recheck next visit.  Return in about 6 months (around 05/27/2021) for memory, bp check.   12/19/2020   BP Readings from Last 3 Encounters:  11/25/20 124/72  10/25/20 119/80  10/05/20 (!) 146/70

## 2020-12-19 ENCOUNTER — Encounter: Payer: Self-pay | Admitting: Family Medicine

## 2020-12-20 ENCOUNTER — Encounter (INDEPENDENT_AMBULATORY_CARE_PROVIDER_SITE_OTHER): Payer: Self-pay | Admitting: Family Medicine

## 2020-12-20 ENCOUNTER — Ambulatory Visit (INDEPENDENT_AMBULATORY_CARE_PROVIDER_SITE_OTHER): Payer: PPO | Admitting: Family Medicine

## 2020-12-20 ENCOUNTER — Other Ambulatory Visit: Payer: Self-pay

## 2020-12-20 VITALS — BP 123/80 | HR 66 | Temp 97.6°F | Ht 63.0 in | Wt 221.0 lb

## 2020-12-20 DIAGNOSIS — F419 Anxiety disorder, unspecified: Secondary | ICD-10-CM

## 2020-12-20 DIAGNOSIS — E559 Vitamin D deficiency, unspecified: Secondary | ICD-10-CM

## 2020-12-20 DIAGNOSIS — E7849 Other hyperlipidemia: Secondary | ICD-10-CM | POA: Diagnosis not present

## 2020-12-20 DIAGNOSIS — Z6841 Body Mass Index (BMI) 40.0 and over, adult: Secondary | ICD-10-CM

## 2020-12-20 MED ORDER — BUSPIRONE HCL 5 MG PO TABS: 5.0000 mg | ORAL_TABLET | Freq: Three times a day (TID) | ORAL | 1 refills | Status: DC

## 2020-12-21 NOTE — Progress Notes (Signed)
Chief Complaint:   OBESITY [name] is here to discuss her progress with her obesity treatment plan along with follow-up of her obesity related diagnoses. [name] is on keeping a food journal and adhering to recommended goals of 1450-1500 calories and 90 grams protein and states she is following her eating plan approximately 75% of the time. [name] states she is walking, house work, and yard work  1-2 times per week.  Today's visit was #: 4 Starting weight: 267 lbs Starting date: 10/31/2018 Today's weight: 221 lbs Today's date: 12/20/2020 Total lbs lost to date: 46 Total lbs lost since last in-office visit: 2  Interim History: [name] went on a mini vacation to [place] with her daughter and was able to do some walking loops. She reports she journals daily and hits protein goal daily. Calorie wise, she is labile- hitting 1200-1600 calories without consistency. She is trying to integrate more plant protein like beans, edamame, and quinoa. Pt voices she isn't doing much vegetarian products.   Subjective:   1. Other hyperlipidemia LDL of 116, HDL 59, and triglycerides 45. [name] is on Lipitor but alternating 80 to 40.  2. Vitamin D deficiency [name] denies nausea, vomiting, and muscle weakness but notes fatigue. Pt is on OTC Vit D 5K IU daily.  Assessment/Plan:   1. Other hyperlipidemia Cardiovascular risk and specific lipid/LDL goals reviewed.  We discussed several lifestyle modifications today and [name] will continue to work on diet, exercise and weight loss efforts. Orders and follow up as documented in patient record. Continue Lipitor.  Counseling Intensive lifestyle modifications are the first line treatment for this issue. . Dietary changes: Increase soluble fiber. Decrease simple carbohydrates. . Exercise changes: Moderate to vigorous-intensity aerobic activity 150 minutes per week if tolerated. . Lipid-lowering medications: see documented in medical record.  2.  Vitamin D deficiency Low Vitamin D level contributes to fatigue and are associated with obesity, breast, and colon cancer. She agrees to continue to take OTC Vitamin D @5 ,000 IU daily and will follow-up for routine testing of Vitamin D, at least 2-3 times per year to avoid over-replacement.  3. Class 3 severe obesity with serious comorbidity and body mass index (BMI) of 45.0 to 49.9 in adult, unspecified obesity type [name])  Analy is currently in the action stage of change. As such, her goal is to continue with weight loss efforts. She has agreed to keeping a food journal and adhering to recommended goals of 1450-1500 calories and 90 grams protein.   Exercise goals: As is  Behavioral modification strategies: increasing lean protein intake, meal planning and cooking strategies and keeping healthy foods in the home.  [name] has agreed to follow-up with our clinic in 6-8 weeks. She was informed of the importance of frequent follow-up visits to maximize her success with intensive lifestyle modifications for her multiple health conditions.   Objective:   Blood pressure 123/80, pulse 66, temperature 97.6 F (36.4 C), height 5\' 3"  (1.6 m), weight 221 lb (100.2 kg), SpO2 98 %. Body mass index is 39.15 kg/m.  General: Cooperative, alert, well developed, in no acute distress. HEENT: Conjunctivae and lids unremarkable. Cardiovascular: Regular rhythm.  Lungs: Normal work of breathing. Neurologic: No focal deficits.   Lab Results  Component Value Date   CREATININE 0.80 10/25/2020   BUN 16 10/25/2020   NA 144 10/25/2020   K 4.2 10/25/2020   CL 105 10/25/2020   CO2 23 10/25/2020   Lab Results  Component Value Date   ALT 19  10/25/2020   AST 23 10/25/2020   ALKPHOS 67 10/25/2020   BILITOT 0.5 10/25/2020   Lab Results  Component Value Date   HGBA1C 5.4 02/27/2019   HGBA1C 5.5 10/31/2018   Lab Results  Component Value Date   INSULIN 8.3 02/27/2019   INSULIN 12.3 10/31/2018   Lab  Results  Component Value Date   TSH 2.53 03/17/2020   Lab Results  Component Value Date   CHOL 184 10/25/2020   HDL 59 10/25/2020   LDLCALC 116 (H) 10/25/2020   TRIG 45 10/25/2020   CHOLHDL 4.6 (H) 05/14/2020   Lab Results  Component Value Date   WBC 4.1 11/15/2020   HGB 13.0 11/15/2020   HCT 40.3 11/15/2020   MCV 86 11/15/2020   PLT 206 11/15/2020   Attestation Statements:   Reviewed by clinician on day of visit: allergies, medications, problem list, medical history, surgical history, family history, social history, and previous encounter notes.  Time spent on visit including pre-visit chart review and post-visit care and charting was 15 minutes.   [name], am acting as transcriptionist for Coralie Common, MD.   I have reviewed the above documentation for accuracy and completeness, and I agree with the above. - Catherine Blossom, MD

## 2020-12-29 NOTE — Progress Notes (Deleted)
Office Visit Note  Patient: [name]             Date of Birth: Sep 03, 1950           MRN: [id number]             PCP: Erven Colla, DO Referring: Erven Colla, DO Visit Date: 01/12/2021 Occupation: @GUAROCC @  Subjective:  No chief complaint on file.   History of Present Illness: [name] is a 70 y.o. female ***   Activities of Daily Living:  Patient reports morning stiffness for *** {minute/hour:19697}.   Patient {ACTIONS;DENIES/REPORTS:21021675::"Denies"} nocturnal pain.  Difficulty dressing/grooming: {ACTIONS;DENIES/REPORTS:21021675::"Denies"} Difficulty climbing stairs: {ACTIONS;DENIES/REPORTS:21021675::"Denies"} Difficulty getting out of chair: {ACTIONS;DENIES/REPORTS:21021675::"Denies"} Difficulty using hands for taps, buttons, cutlery, and/or writing: {ACTIONS;DENIES/REPORTS:21021675::"Denies"}  No Rheumatology ROS completed.   PMFS History:  Patient Active Problem List   Diagnosis Date Noted  . Encounter for screening fecal occult blood testing 09/23/2020  . Encounter for gynecological examination with Papanicolaou smear of cervix 09/23/2020  . Pain of right breast 09/23/2020  . Anxiety 09/23/2020  . Vitamin D deficiency 07/29/2019  . Depression with anxiety 06/19/2019  . History of total knee replacement, bilateral 10/30/2018  . History of MRSA infection 04/04/2017  . High risk medication use 03/02/2017  . Hypercalcemia 03/02/2017  . Pelvic pain in female 08/25/2015  . Hematuria 08/25/2015  . History of adenomatous polyp of colon 10/01/2014  . Seropositive rheumatoid arthritis of multiple sites ([place]) 07/20/2014  . History of juvenile rheumatoid arthritis 07/20/2014  . Impaired fasting glucose 07/20/2014  . NEOPLASM, SKIN, UNCERTAIN BEHAVIOR 01/60/1093  . Unilateral primary osteoarthritis, right knee 09/23/2008  . CALF PAIN, RIGHT 09/23/2008  . ANXIETY DEPRESSION 08/07/2008  . Class 2 severe obesity with serious comorbidity and body mass  index (BMI) of 38.0 to 38.9 in adult ([place]) 12/19/2007  . DDD (degenerative disc disease), lumbar 06/27/2007  . Osteopenia of both forearms 06/27/2007  . HIP PAIN, LEFT 06/18/2007  . CONSTIPATION 05/16/2007  . Essential hypertension 04/04/2007  . Psoriasis 03/07/2007  . OSTEOARTHROSIS, GENERALIZED, MULTIPLE SITES 03/07/2007  . ANOSMIA 03/07/2007  . LIPOMA 02/21/2007  . Hyperlipidemia 02/21/2007  . ALLERGIC RHINITIS 02/21/2007  . ASTHMA 02/21/2007  . ARTHRITIS 02/21/2007  . KNEE PAIN, LEFT 02/21/2007  . LOW BACK PAIN, CHRONIC 02/21/2007    Past Medical History:  Diagnosis Date  . Allergic rhinitis   . Anxiety   . Arthritis   . Asthma   . Constipation   . Depression   . Gallbladder problem   . Hematuria 08/25/2015  . Hyperlipidemia   . Hypertension   . Obesity   . Osteoarthritis   . Pelvic pain in female 08/25/2015  . Rheumatoid arthritis ([place])   . Swallowing difficulty   . Swelling     Family History  Problem Relation Age of Onset  . Heart attack Father   . Alcoholism Father   . Depression Father   . Heart disease Father   . Hyperlipidemia Father   . Hypertension Father   . Leukemia Mother   . Hypertension Mother   . Cancer Mother   . Depression Mother   . Obesity Sister   . Other Sister        breathing problems  . Hepatitis C Sister   . Arthritis Sister   . Depression Sister   . Colon cancer Sister   . Squamous cell carcinoma Sister   . Emphysema Brother   . Obesity Daughter   . Polycystic ovary syndrome  Daughter   . Obesity Son   . Other Son        knee problems  . Cancer Maternal Grandmother        breast  . Other Brother        aneursym  . Arthritis Sister   . Depression Sister   . Squamous cell carcinoma Sister   . Obesity Son   . Asthma Son   . Obesity Son   . Diabetes Other        runs on dad's side of the family   Past Surgical History:  Procedure Laterality Date  . BACK SURGERY    . CHOLECYSTECTOMY    . COLONOSCOPY  2007   sessile  sigmoid polyp x 1; path: serrated adenoma  . COLONOSCOPY N/A 10/12/2014   Procedure: COLONOSCOPY;  Surgeon: [name], MD;  [facility];  Service: Endoscopy;  Laterality: N/A;  . COLONOSCOPY N/A 03/05/2020   Procedure: COLONOSCOPY;  Surgeon: Daneil Dolin, MD;  [facility];  Service: Endoscopy;  Laterality: N/A;  9:30  . JOINT REPLACEMENT     BIL knee   . KNEE ARTHROPLASTY    . KNEE SURGERY     bilateral knee replacement  . LIPOMA EXCISION     x 2  . TONSILLECTOMY AND ADENOIDECTOMY     Social History   Social History Narrative  . Not on file   Immunization History  Administered Date(s) Administered  . Fluad Quad(high Dose 65+) 05/27/2020  . Influenza Split 06/17/2013  . Influenza Whole 06/27/2007, 05/08/2008  . Influenza, High Dose Seasonal PF 06/14/2017, 06/18/2018  . Influenza,inj,Quad PF,6+ Mos 07/12/2015  . Influenza-Unspecified 06/14/2017, 06/18/2018, 05/11/2019  . PFIZER(Purple Top)SARS-COV-2 Vaccination 10/12/2019, 11/04/2019, 06/03/2020  . Pneumococcal Polysaccharide-23 02/18/1993, 06/27/2007  . Zoster Recombinat (Shingrix) 08/19/2018     Objective: Vital Signs: There were no vitals taken for this visit.   Physical Exam   Musculoskeletal Exam: ***  CDAI Exam: CDAI Score: -- Patient Global: --; Provider Global: -- Swollen: --; Tender: -- Joint Exam 01/12/2021   No joint exam has been documented for this visit   There is currently no information documented on the homunculus. Go to the Rheumatology activity and complete the homunculus joint exam.  Investigation: No additional findings.  Imaging: No results found.  Recent Labs: Lab Results  Component Value Date   WBC 4.1 11/15/2020   HGB 13.0 11/15/2020   PLT 206 11/15/2020   NA 144 10/25/2020   K 4.2 10/25/2020   CL 105 10/25/2020   CO2 23 10/25/2020   GLUCOSE 80 10/25/2020   BUN 16 10/25/2020   CREATININE 0.80 10/25/2020   BILITOT 0.5 10/25/2020   ALKPHOS 67  10/25/2020   AST 23 10/25/2020   ALT 19 10/25/2020   PROT 6.9 10/25/2020   ALBUMIN 4.4 10/25/2020   CALCIUM 10.2 10/25/2020   GFRAA 73 07/23/2020   QFTBGOLDPLUS NEGATIVE 02/02/2020    Speciality Comments: PLQ Eye Exam: 12/03/2019 WNL @ [facility] Follow up in 1 year MTX -dcd 09/19 Enbrel start date 03/10/20  Procedures:  No procedures performed Allergies: Codeine, Cyclosporine, and Propylene glycol   Assessment / Plan:     Visit Diagnoses: No diagnosis found.  Orders: No orders of the defined types were placed in this encounter.  No orders of the defined types were placed in this encounter.   Face-to-face time spent with patient was *** minutes. Greater than 50% of time was spent in counseling and coordination  of care.  Follow-Up Instructions: No follow-ups on file.   Earnestine Mealing, CMA  Note - This record has been created using Editor, commissioning.  Chart creation errors have been sought, but may not always  have been located. Such creation errors do not reflect on  the standard of medical care.

## 2021-01-11 ENCOUNTER — Other Ambulatory Visit: Payer: Self-pay | Admitting: Family Medicine

## 2021-01-11 DIAGNOSIS — F419 Anxiety disorder, unspecified: Secondary | ICD-10-CM

## 2021-01-11 NOTE — Telephone Encounter (Signed)
Seen 11/25/20

## 2021-01-12 ENCOUNTER — Ambulatory Visit: Payer: PPO | Admitting: Rheumatology

## 2021-01-12 DIAGNOSIS — Z79899 Other long term (current) drug therapy: Secondary | ICD-10-CM

## 2021-01-12 DIAGNOSIS — Z8614 Personal history of Methicillin resistant Staphylococcus aureus infection: Secondary | ICD-10-CM

## 2021-01-12 DIAGNOSIS — Z8709 Personal history of other diseases of the respiratory system: Secondary | ICD-10-CM

## 2021-01-12 DIAGNOSIS — I10 Essential (primary) hypertension: Secondary | ICD-10-CM

## 2021-01-12 DIAGNOSIS — Z96653 Presence of artificial knee joint, bilateral: Secondary | ICD-10-CM

## 2021-01-12 DIAGNOSIS — Z8659 Personal history of other mental and behavioral disorders: Secondary | ICD-10-CM

## 2021-01-12 DIAGNOSIS — M0579 Rheumatoid arthritis with rheumatoid factor of multiple sites without organ or systems involvement: Secondary | ICD-10-CM

## 2021-01-12 DIAGNOSIS — M7061 Trochanteric bursitis, right hip: Secondary | ICD-10-CM

## 2021-01-12 DIAGNOSIS — Z8639 Personal history of other endocrine, nutritional and metabolic disease: Secondary | ICD-10-CM

## 2021-01-12 DIAGNOSIS — M8589 Other specified disorders of bone density and structure, multiple sites: Secondary | ICD-10-CM

## 2021-01-12 DIAGNOSIS — M5136 Other intervertebral disc degeneration, lumbar region: Secondary | ICD-10-CM

## 2021-01-12 DIAGNOSIS — Z86010 Personal history of colonic polyps: Secondary | ICD-10-CM

## 2021-01-18 NOTE — Progress Notes (Signed)
Office Visit Note  Patient: [name]             Date of Birth: 06-Feb-1951           MRN: [id number]             PCP: Erven Colla, DO Referring: Erven Colla, DO Visit Date: 01/31/2021 Occupation: @GUAROCC @  Subjective:  Right shoulder pain   History of Present Illness: [name] is a 70 y.o. female with a history of seropositive rheumatoid arthritis and osteoarthritis.  She states she has had chronic right shoulder pain which has been worse in the last 1 week.  She states the pain has been radiating down her arm.  She continues to have pain and discomfort in her knee joints, both hands and both feet.  She has not seen visible swelling.  She has been taking Enbrel on a regular basis.  Activities of Daily Living:  Patient reports morning stiffness for 1 hour.   Patient Reports nocturnal pain.  Difficulty dressing/grooming: Denies Difficulty climbing stairs: Denies Difficulty getting out of chair: Denies Difficulty using hands for taps, buttons, cutlery, and/or writing: Reports  Review of Systems  Constitutional:  Positive for fatigue.  HENT:  Positive for mouth dryness and nose dryness. Negative for mouth sores.   Eyes:  Positive for pain and dryness. Negative for itching.  Respiratory:  Negative for shortness of breath and difficulty breathing.   Cardiovascular:  Negative for chest pain and palpitations.  Gastrointestinal:  Negative for blood in stool, constipation and diarrhea.  Endocrine: Negative for increased urination.  Genitourinary:  Negative for difficulty urinating.  Musculoskeletal:  Positive for joint pain, joint pain, myalgias, morning stiffness, muscle tenderness and myalgias. Negative for joint swelling.  Skin:  Negative for color change, rash and redness.  Allergic/Immunologic: Negative for susceptible to infections.  Neurological:  Positive for numbness, headaches and weakness. Negative for dizziness and memory loss.  Hematological:   Negative for bruising/bleeding tendency.  Psychiatric/Behavioral:  Negative for confusion.    PMFS History:  Patient Active Problem List   Diagnosis Date Noted   Encounter for screening fecal occult blood testing 09/23/2020   Encounter for gynecological examination with Papanicolaou smear of cervix 09/23/2020   Pain of right breast 09/23/2020   Anxiety 09/23/2020   Vitamin D deficiency 07/29/2019   Depression with anxiety 06/19/2019   History of total knee replacement, bilateral 10/30/2018   History of MRSA infection 04/04/2017   High risk medication use 03/02/2017   Hypercalcemia 03/02/2017   Pelvic pain in female 08/25/2015   Hematuria 08/25/2015   History of adenomatous polyp of colon 10/01/2014   Seropositive rheumatoid arthritis of multiple sites ([place]) 07/20/2014   History of juvenile rheumatoid arthritis 07/20/2014   Impaired fasting glucose 07/20/2014   NEOPLASM, SKIN, UNCERTAIN BEHAVIOR 25/85/2778   Unilateral primary osteoarthritis, right knee 09/23/2008   CALF PAIN, RIGHT 09/23/2008   ANXIETY DEPRESSION 08/07/2008   Class 2 severe obesity with serious comorbidity and body mass index (BMI) of 38.0 to 38.9 in adult ([place]) 12/19/2007   DDD (degenerative disc disease), lumbar 06/27/2007   Osteopenia of both forearms 06/27/2007   HIP PAIN, LEFT 06/18/2007   CONSTIPATION 05/16/2007   Essential hypertension 04/04/2007   Psoriasis 03/07/2007   OSTEOARTHROSIS, GENERALIZED, MULTIPLE SITES 03/07/2007   ANOSMIA 03/07/2007   LIPOMA 02/21/2007   Hyperlipidemia 02/21/2007   ALLERGIC RHINITIS 02/21/2007   ASTHMA 02/21/2007   ARTHRITIS 02/21/2007   KNEE PAIN, LEFT 02/21/2007  LOW BACK PAIN, CHRONIC 02/21/2007    Past Medical History:  Diagnosis Date   Allergic rhinitis    Anxiety    Arthritis    Asthma    Constipation    Depression    Gallbladder problem    Hematuria 08/25/2015   Hyperlipidemia    Hypertension    Obesity    Osteoarthritis    Pelvic pain in female  08/25/2015   Rheumatoid arthritis ([place])    Swallowing difficulty    Swelling     Family History  Problem Relation Age of Onset   Heart attack Father    Alcoholism Father    Depression Father    Heart disease Father    Hyperlipidemia Father    Hypertension Father    Leukemia Mother    Hypertension Mother    Cancer Mother    Depression Mother    Obesity Sister    Other Sister        breathing problems   Hepatitis C Sister    Arthritis Sister    Depression Sister    Colon cancer Sister    Squamous cell carcinoma Sister    Emphysema Brother    Obesity Daughter    Polycystic ovary syndrome Daughter    Obesity Son    Other Son        knee problems   Cancer Maternal Grandmother        breast   Other Brother        aneursym   Arthritis Sister    Depression Sister    Squamous cell carcinoma Sister    Obesity Son    Asthma Son    Obesity Son    Diabetes Other        runs on dad's side of the family   Past Surgical History:  Procedure Laterality Date   BACK SURGERY     CHOLECYSTECTOMY     COLONOSCOPY  2007   sessile sigmoid polyp x 1; path: serrated adenoma   COLONOSCOPY N/A 10/12/2014   Procedure: COLONOSCOPY;  Surgeon: [name], MD;  [facility];  Service: Endoscopy;  Laterality: N/A;   COLONOSCOPY N/A 03/05/2020   Procedure: COLONOSCOPY;  Surgeon: Daneil Dolin, MD;  [facility];  Service: Endoscopy;  Laterality: N/A;  9:30   JOINT REPLACEMENT     BIL knee    KNEE ARTHROPLASTY     KNEE SURGERY     bilateral knee replacement   LIPOMA EXCISION     x 2   TONSILLECTOMY AND ADENOIDECTOMY     Social History   Social History Narrative   Not on file   Immunization History  Administered Date(s) Administered   Fluad Quad(high Dose 65+) 05/27/2020   Influenza Split 06/17/2013   Influenza Whole 06/27/2007, 05/08/2008   Influenza, High Dose Seasonal PF 06/14/2017, 06/18/2018   Influenza,inj,Quad PF,6+ Mos 07/12/2015    Influenza-Unspecified 06/14/2017, 06/18/2018, 05/11/2019   PFIZER(Purple Top)SARS-COV-2 Vaccination 10/12/2019, 11/04/2019, 06/03/2020   Pneumococcal Polysaccharide-23 02/18/1993, 06/27/2007   Zoster Recombinat (Shingrix) 08/19/2018     Objective: Vital Signs: BP 118/74 (BP Location: Left Arm, Patient Position: Sitting, Cuff Size: Normal)   Pulse (!) 58   Resp 16   Ht 5\' 3"  (1.6 m)   Wt 230 lb 3.2 oz (104.4 kg)   BMI 40.78 kg/m    Physical Exam Vitals and nursing note reviewed.  Constitutional:      Appearance: She is well-developed.  HENT:     Head:  Normocephalic and atraumatic.  Eyes:     Conjunctiva/sclera: Conjunctivae normal.  Cardiovascular:     Rate and Rhythm: Normal rate and regular rhythm.     Heart sounds: Normal heart sounds.  Pulmonary:     Effort: Pulmonary effort is normal.     Breath sounds: Normal breath sounds.  Abdominal:     General: Bowel sounds are normal.     Palpations: Abdomen is soft.  Musculoskeletal:     Cervical back: Normal range of motion.  Lymphadenopathy:     Cervical: No cervical adenopathy.  Skin:    General: Skin is warm and dry.     Capillary Refill: Capillary refill takes less than 2 seconds.  Neurological:     Mental Status: She is alert and oriented to person, place, and time.  Psychiatric:        Behavior: Behavior normal.     Musculoskeletal Exam: C-spine was in good range of motion.  She had discomfort range of motion of her right shoulder joint.  No warmth swelling or effusion was noted.  Elbow joints, wrist joints with good range of motion.  She has synovial thickening and subluxation of most of her MCPs.  No synovitis was noted.  No PIP and DIP swelling was noted.  Hip joints and knee joints with good range of motion.  She had no warmth or swelling over her ankle joints.  She had overcrowding of her toes and hammertoes without any synovitis.  CDAI Exam: CDAI Score: 1.8  Patient Global: 5 mm; Provider Global: 3 mm Swollen:  0 ; Tender: 1  Joint Exam 01/31/2021      Right  Left  Glenohumeral   Tender        Investigation: No additional findings.  Imaging: No results found.  Recent Labs: Lab Results  Component Value Date   WBC 4.8 01/27/2021   HGB 12.2 01/27/2021   PLT 188 01/27/2021   NA 144 01/27/2021   K 4.3 01/27/2021   CL 106 01/27/2021   CO2 24 01/27/2021   GLUCOSE 102 (H) 01/27/2021   BUN 16 01/27/2021   CREATININE 0.80 01/27/2021   BILITOT 0.6 01/27/2021   ALKPHOS 86 01/27/2021   AST 22 01/27/2021   ALT 19 01/27/2021   PROT 6.9 01/27/2021   ALBUMIN 4.7 01/27/2021   CALCIUM 10.3 01/27/2021   GFRAA 73 07/23/2020   QFTBGOLDPLUS Negative 01/27/2021    Speciality Comments: PLQ Eye Exam: 12/03/2019 WNL @ [facility] Follow up in 1 year MTX -dcd 09/19 Enbrel start date 03/10/20  Procedures:  Large Joint Inj: R glenohumeral on 01/31/2021 10:27 AM Indications: pain Details: 27 G 1.5 in needle, posterior approach  Arthrogram: No  Medications: 40 mg triamcinolone acetonide 40 MG/ML; 1.5 mL lidocaine 1 % Aspirate: 0 mL Outcome: tolerated well, no immediate complications Procedure, treatment alternatives, risks and benefits explained, specific risks discussed. Consent was given by the patient. Immediately prior to procedure a time out was called to verify the correct patient, procedure, equipment, support staff and site/side marked as required. Patient was prepped and draped in the usual sterile fashion.    Allergies: Codeine, Cyclosporine, and Propylene glycol   Assessment / Plan:     Visit Diagnoses: Seropositive rheumatoid arthritis of multiple sites ([place]) - positive RF, positive anti-CCP. Previous patient of Dr. Charlestine Night with multiple contractures, ultrasound of both hands on 10/30/18 revealed synovitis: She has been having increased pain and discomfort in her joints especially her right shoulder.  I do not  see any synovitis on my examination today.  High risk medication use -  Enbrel 50 mg sq injections once weekly-started on 03/10/20. (MTX -dcd 09/19).  Plaquenil was discontinued 6 months ago.  Her labs are stable.  TB gold was - January 27, 2021.  She was advised a fourth dose (booster) against COVID-19.  Instructions regarding vaccinations were placed in the AVS.  She was advised to have annual skin examination to screen for nonmelanoma skin cancer.  She was also advised to stop Enbrel in case she develops an infection.  She may resume Enbrel once the infection resolves.  Instructions were placed in the AVS.  Chronic right shoulder pain-she has been experiencing increased pain and discomfort in her right shoulder.  Different treatment options were discussed.  She wants to proceed with the cortisone injection.  She had good results in the past.  After informed consent was obtained right shoulder joint was prepped in sterile fashion and injected with cortisone as described above.  She tolerated the procedure well.  History of total knee replacement, bilateral-she had good range of motion without discomfort.  Trochanteric bursitis of right hip - Resolved.   DDD (degenerative disc disease), lumbar - S/p fusion.  Chronic pain.  Osteopenia of multiple sites - DEXA 01/12/20: The BMD measured at Forearm Radius 33% is 0.555 g/cm2 with a T-score of -2.2.   Essential hypertension-blood pressure is normal today.  History of hyperlipidemia  History of asthma  History of adenomatous polyp of colon  History of depression  History of anxiety  History of MRSA infection  Orders: Orders Placed This Encounter  Procedures   Large Joint Inj   No orders of the defined types were placed in this encounter.    Follow-Up Instructions: Return in about 3 months (around 05/03/2021) for Rheumatoid arthritis, Osteoarthritis.   [name], MD  Note - This record has been created using Editor, commissioning.  Chart creation errors have been sought, but may not always  have been  located. Such creation errors do not reflect on  the standard of medical care.

## 2021-01-26 ENCOUNTER — Other Ambulatory Visit: Payer: Self-pay | Admitting: Family Medicine

## 2021-01-26 DIAGNOSIS — F419 Anxiety disorder, unspecified: Secondary | ICD-10-CM

## 2021-01-26 NOTE — Telephone Encounter (Signed)
Pt states she is not taking Buspar 3 times per day; most of the times takes it once a day but on tough days she will take it twice. Pt states she does not need a refill at this time. Pt transferred up front to schedule follow up in July.

## 2021-01-26 NOTE — Telephone Encounter (Signed)
Left message to return call 

## 2021-01-27 ENCOUNTER — Other Ambulatory Visit: Payer: Self-pay | Admitting: *Deleted

## 2021-01-27 ENCOUNTER — Encounter: Payer: Self-pay | Admitting: Rheumatology

## 2021-01-27 DIAGNOSIS — Z111 Encounter for screening for respiratory tuberculosis: Secondary | ICD-10-CM

## 2021-01-27 DIAGNOSIS — Z79899 Other long term (current) drug therapy: Secondary | ICD-10-CM

## 2021-01-28 NOTE — Progress Notes (Signed)
CBC and CMP WNL

## 2021-01-31 ENCOUNTER — Encounter: Payer: Self-pay | Admitting: Rheumatology

## 2021-01-31 ENCOUNTER — Ambulatory Visit: Payer: PPO | Admitting: Rheumatology

## 2021-01-31 ENCOUNTER — Other Ambulatory Visit: Payer: Self-pay

## 2021-01-31 VITALS — BP 118/74 | HR 58 | Resp 16 | Ht 63.0 in | Wt 230.2 lb

## 2021-01-31 DIAGNOSIS — M8589 Other specified disorders of bone density and structure, multiple sites: Secondary | ICD-10-CM | POA: Diagnosis not present

## 2021-01-31 DIAGNOSIS — Z79899 Other long term (current) drug therapy: Secondary | ICD-10-CM | POA: Diagnosis not present

## 2021-01-31 DIAGNOSIS — M5136 Other intervertebral disc degeneration, lumbar region: Secondary | ICD-10-CM | POA: Diagnosis not present

## 2021-01-31 DIAGNOSIS — M7061 Trochanteric bursitis, right hip: Secondary | ICD-10-CM

## 2021-01-31 DIAGNOSIS — I10 Essential (primary) hypertension: Secondary | ICD-10-CM | POA: Diagnosis not present

## 2021-01-31 DIAGNOSIS — G8929 Other chronic pain: Secondary | ICD-10-CM | POA: Diagnosis not present

## 2021-01-31 DIAGNOSIS — Z8659 Personal history of other mental and behavioral disorders: Secondary | ICD-10-CM

## 2021-01-31 DIAGNOSIS — Z8639 Personal history of other endocrine, nutritional and metabolic disease: Secondary | ICD-10-CM

## 2021-01-31 DIAGNOSIS — Z8614 Personal history of Methicillin resistant Staphylococcus aureus infection: Secondary | ICD-10-CM

## 2021-01-31 DIAGNOSIS — Z8709 Personal history of other diseases of the respiratory system: Secondary | ICD-10-CM | POA: Diagnosis not present

## 2021-01-31 DIAGNOSIS — M25511 Pain in right shoulder: Secondary | ICD-10-CM | POA: Diagnosis not present

## 2021-01-31 DIAGNOSIS — M51369 Other intervertebral disc degeneration, lumbar region without mention of lumbar back pain or lower extremity pain: Secondary | ICD-10-CM

## 2021-01-31 DIAGNOSIS — M0579 Rheumatoid arthritis with rheumatoid factor of multiple sites without organ or systems involvement: Secondary | ICD-10-CM | POA: Diagnosis not present

## 2021-01-31 DIAGNOSIS — Z860101 Personal history of adenomatous and serrated colon polyps: Secondary | ICD-10-CM

## 2021-01-31 DIAGNOSIS — Z96653 Presence of artificial knee joint, bilateral: Secondary | ICD-10-CM | POA: Diagnosis not present

## 2021-01-31 DIAGNOSIS — Z86010 Personal history of colonic polyps: Secondary | ICD-10-CM | POA: Diagnosis not present

## 2021-01-31 LAB — QUANTIFERON-TB GOLD PLUS
QuantiFERON Mitogen Value: >10 IU/mL
QuantiFERON Nil Value: 0.99 IU/mL
QuantiFERON TB1 Ag Value: 0.1 IU/mL
QuantiFERON TB2 Ag Value: 0.1 IU/mL
QuantiFERON-TB Gold Plus: NEGATIVE

## 2021-01-31 LAB — CBC WITH DIFFERENTIAL/PLATELET
Basophils Absolute: 0 10*3/uL (ref 0.0–0.2)
Basos: 0 %
EOS (ABSOLUTE): 0.1 10*3/uL (ref 0.0–0.4)
Eos: 3 %
Hematocrit: 39.3 % (ref 34.0–46.6)
Hemoglobin: 12.2 g/dL (ref 11.1–15.9)
Immature Grans (Abs): 0 10*3/uL (ref 0.0–0.1)
Immature Granulocytes: 0 %
Lymphocytes Absolute: 2.2 10*3/uL (ref 0.7–3.1)
Lymphs: 46 %
MCH: 27.2 pg (ref 26.6–33.0)
MCHC: 31 g/dL — ABNORMAL LOW (ref 31.5–35.7)
MCV: 88 fL (ref 79–97)
Monocytes Absolute: 0.5 10*3/uL (ref 0.1–0.9)
Monocytes: 11 %
Neutrophils Absolute: 1.9 10*3/uL (ref 1.4–7.0)
Neutrophils: 40 %
Platelets: 188 10*3/uL (ref 150–450)
RBC: 4.49 x10E6/uL (ref 3.77–5.28)
RDW: 15.1 % (ref 11.7–15.4)
WBC: 4.8 10*3/uL (ref 3.4–10.8)

## 2021-01-31 LAB — CMP14+EGFR
ALT: 19 IU/L (ref 0–32)
AST: 22 IU/L (ref 0–40)
Albumin/Globulin Ratio: 2.1 (ref 1.2–2.2)
Albumin: 4.7 g/dL (ref 3.8–4.8)
Alkaline Phosphatase: 86 IU/L (ref 44–121)
BUN/Creatinine Ratio: 20 (ref 12–28)
BUN: 16 mg/dL (ref 8–27)
Bilirubin Total: 0.6 mg/dL (ref 0.0–1.2)
CO2: 24 mmol/L (ref 20–29)
Calcium: 10.3 mg/dL (ref 8.7–10.3)
Chloride: 106 mmol/L (ref 96–106)
Creatinine, Ser: 0.8 mg/dL (ref 0.57–1.00)
Globulin, Total: 2.2 g/dL (ref 1.5–4.5)
Glucose: 102 mg/dL — ABNORMAL HIGH (ref 65–99)
Potassium: 4.3 mmol/L (ref 3.5–5.2)
Sodium: 144 mmol/L (ref 134–144)
Total Protein: 6.9 g/dL (ref 6.0–8.5)
eGFR: 80 mL/min/{1.73_m2} (ref >59)

## 2021-01-31 MED ORDER — TRIAMCINOLONE ACETONIDE 40 MG/ML IJ SUSP
40.0000 mg | INTRAMUSCULAR | Status: AC | PRN
  Administered 2021-01-31: 40 mg via INTRA_ARTICULAR

## 2021-01-31 MED ORDER — LIDOCAINE HCL 1 % IJ SOLN
1.5000 mL | INTRAMUSCULAR | Status: AC | PRN
  Administered 2021-01-31: 1.5 mL

## 2021-01-31 NOTE — Patient Instructions (Signed)
Standing Labs We placed an order today for your standing lab work.   Please have your standing labs drawn in September and every 3 months  If possible, please have your labs drawn 2 weeks prior to your appointment so that the provider can discuss your results at your appointment.  Please note that you may see your imaging and lab results in [facility] before we have reviewed them. We may be awaiting multiple results to interpret others before contacting you. Please allow our office up to 72 hours to thoroughly review all of the results before contacting the office for clarification of your results.  We have open lab daily: Monday through Thursday from 1:30-4:30 PM and Friday from 1:30-4:00 PM at the office of Dr. [name], [facility].   Please be advised, all patients with office appointments requiring lab work will take precedent over walk-in lab work.  If possible, please come for your lab work on Monday and Friday afternoons, as you may experience shorter wait times. The office is located at [address] [place], [place], [place] [address] No appointment is necessary.   Labs are drawn by [facility]. Please bring your co-pay at the time of your lab draw.  You may receive a bill from [facility] for your lab work.  If you wish to have your labs drawn at another location, please call the office 24 hours in advance to send orders.  If you have any questions regarding directions or hours of operation,  please call [phone].   As a reminder, please drink plenty of water prior to coming for your lab work. Thanks!   Vaccines You are taking a medication(s) that can suppress your immune system.  The following immunizations are recommended: Flu annually Covid-19  Td/Tdap (tetanus, diphtheria, pertussis) every 10 years Pneumonia (Prevnar 15 then Pneumovax 23 at least 1 year apart.  Alternatively, can take Prevnar 20 without needing additional dose) Shingrix (after age 33): 2  doses from 4 weeks to 6 months apart  Please check with your PCP to make sure you are up to date.    If you test POSITIVE for COVID19 and have MILD to MODERATE symptoms: First, call your PCP if you would like to receive COVID19 treatment AND Hold your medications during the infection and for at least 1 week after your symptoms have resolved: Injectable medication (Benlysta, Cimzia, Cosentyx, Enbrel, Humira, Orencia, Remicade, Simponi, Stelara, Taltz, Tremfya) Methotrexate Leflunomide (Arava) Mycophenolate (Cellcept) Morrie Sheldon, Olumiant, or Rinvoq If you take Actemra or Kevzara, you DO NOT need to hold these for COVID19 infection.  If you test POSITIVE for COVID19 and have NO symptoms: First, call your PCP if you would like to receive COVID19 treatment AND Hold your medications for at least 10 days after the day that you tested positive Injectable medication (Benlysta, Cimzia, Cosentyx, Enbrel, Humira, Orencia, Remicade, Simponi, Stelara, Taltz, Tremfya) Methotrexate Leflunomide (Arava) Mycophenolate (Cellcept) Morrie Sheldon, Olumiant, or Rinvoq If you take Actemra or Kevzara, you DO NOT need to hold these for COVID19 infection.  If you have signs or symptoms of an infection or start antibiotics: First, call your PCP for workup of your infection. Hold your medication through the infection, until you complete your antibiotics, and until symptoms resolve if you take the following: Injectable medication (Actemra, Benlysta, Cimzia, Cosentyx, Enbrel, Humira, Kevzara, Orencia, Remicade, Simponi, Stelara, Taltz, Tremfya) Methotrexate Leflunomide (Arava) Mycophenolate (Cellcept) Roma Kayser, or Rinvoq    Heart Disease Prevention   Your inflammatory disease increases your risk of heart  disease which includes heart attack, stroke, atrial fibrillation (irregular heartbeats), high blood pressure, heart failure and atherosclerosis (plaque in the arteries).  It is important to reduce your risk by:    Keep blood pressure, cholesterol, and blood sugar at healthy levels   Smoking Cessation   Maintain a healthy weight  BMI 20-25   Eat a healthy diet  Plenty of fresh fruit, vegetables, and whole grains  Limit saturated fats, foods high in sodium, and added sugars  DASH and Mediterranean diet   Increase physical activity  Recommend moderate physically activity for 150 minutes per week/ 30 minutes a day for five days a week These can be broken up into three separate ten-minute sessions during the day.   Reduce Stress  Meditation, slow breathing exercises, yoga, coloring books  Dental visits twice a year     Please see the dermatologist on a yearly basis to screen for nonmelanoma skin cancer

## 2021-01-31 NOTE — Progress Notes (Signed)
TB gold negative

## 2021-02-01 ENCOUNTER — Telehealth: Payer: Self-pay

## 2021-02-01 ENCOUNTER — Other Ambulatory Visit (HOSPITAL_COMMUNITY): Payer: Self-pay

## 2021-02-01 NOTE — Telephone Encounter (Signed)
Patient fills through [employer] P[employer]. Closing encounter.

## 2021-02-01 NOTE — Telephone Encounter (Signed)
Attempted to submit PA renewal to [facility] regarding ENBREL via CoverMyMeds. Medication is "available without authorization" per plan.  Test claim revealed that insurance covers $4,341.40, leaving patient with a copay of $1,904.71. $489.06 attributed to coverage gap.

## 2021-02-02 ENCOUNTER — Other Ambulatory Visit (HOSPITAL_COMMUNITY): Payer: Self-pay

## 2021-02-14 ENCOUNTER — Encounter (INDEPENDENT_AMBULATORY_CARE_PROVIDER_SITE_OTHER): Payer: Self-pay | Admitting: Family Medicine

## 2021-02-14 ENCOUNTER — Other Ambulatory Visit: Payer: Self-pay

## 2021-02-14 ENCOUNTER — Ambulatory Visit (INDEPENDENT_AMBULATORY_CARE_PROVIDER_SITE_OTHER): Payer: PPO | Admitting: Family Medicine

## 2021-02-14 VITALS — BP 117/75 | HR 59 | Temp 97.6°F | Ht 63.0 in | Wt 219.0 lb

## 2021-02-14 DIAGNOSIS — Z6841 Body Mass Index (BMI) 40.0 and over, adult: Secondary | ICD-10-CM

## 2021-02-14 DIAGNOSIS — F419 Anxiety disorder, unspecified: Secondary | ICD-10-CM

## 2021-02-14 DIAGNOSIS — E7849 Other hyperlipidemia: Secondary | ICD-10-CM

## 2021-02-15 NOTE — Progress Notes (Signed)
Chief Complaint:   OBESITY [name] is here to discuss her progress with her obesity treatment plan along with follow-up of her obesity related diagnoses. [name] is on keeping a food journal and adhering to recommended goals of 1450-1500 calories and 90 g protein and states she is following her eating plan approximately 80% of the time. [name] states she is not currently exercising.  Today's visit was #: 15 Starting weight: 267 lbs Starting date: 10/31/2018 Today's weight: 219 lbs Today's date: 02/14/2021 Total lbs lost to date: 48 Total lbs lost since last in-office visit: 2  Interim History: Setsuko has been gardening and planting over the past 6 weeks. Her garden is starting to come in. She saw Rheumatology, who continues Enbrel. Pt has been trying to get all 1100 cal/day and some days makes it, but always gets in protein. She is getting protein with meat, fish, and plant protein.  Subjective:   1. Anxiety [name] feels medicine is helping. She is on Buspar 5 mg, up to 3 times a day.  2. Other hyperlipidemia [name]'s last LDL was 116. She has myalgias but they are related to RA.  Assessment/Plan:   1. Anxiety Behavior modification techniques were discussed today to help [name] deal with her anxiety.  Orders and follow up as documented in patient record.  Continue Buspar.  2. Other hyperlipidemia Cardiovascular risk and specific lipid/LDL goals reviewed.  We discussed several lifestyle modifications today and [name] will continue to work on diet, exercise and weight loss efforts. Orders and follow up as documented in patient record.  Continue Lipitor.  Counseling Intensive lifestyle modifications are the first line treatment for this issue. Dietary changes: Increase soluble fiber. Decrease simple carbohydrates. Exercise changes: Moderate to vigorous-intensity aerobic activity 150 minutes per week if tolerated. Lipid-lowering medications: see documented in medical  record.  3. Class 3 severe obesity with serious comorbidity and body mass index (BMI) of 45.0 to 49.9 in adult, unspecified obesity type [name])  [name] is currently in the action stage of change. As such, her goal is to continue with weight loss efforts. She has agreed to keeping a food journal and adhering to recommended goals of 1450-1500 calories and 90+ g protein.   Work on getting all calories in.  Exercise goals: All adults should avoid inactivity. Some physical activity is better than none, and adults who participate in any amount of physical activity gain some health benefits.- Pt is to start [place].  Behavioral modification strategies: increasing lean protein intake, meal planning and cooking strategies, keeping healthy foods in the home, and planning for success.  [name] has agreed to follow-up with our clinic in 6-8 weeks. She was informed of the importance of frequent follow-up visits to maximize her success with intensive lifestyle modifications for her multiple health conditions.   Objective:   Blood pressure 117/75, pulse (!) 59, temperature 97.6 F (36.4 C), temperature source Oral, height _0  (1.6 m), weight 219 lb (99.3 kg), SpO2 99 %. Body mass index is 38.79 kg/m.  General: Cooperative, alert, well developed, in no acute distress. HEENT: Conjunctivae and lids unremarkable. Cardiovascular: Regular rhythm.  Lungs: Normal work of breathing. Neurologic: No focal deficits.   Lab Results  Component Value Date   CREATININE 0.80 01/27/2021   BUN 16 01/27/2021   NA 144 01/27/2021   K 4.3 01/27/2021   CL 106 01/27/2021   CO2 24 01/27/2021   Lab Results  Component Value Date   ALT 19 01/27/2021  AST 22 01/27/2021   ALKPHOS 86 01/27/2021   BILITOT 0.6 01/27/2021   Lab Results  Component Value Date   HGBA1C 5.4 02/27/2019   HGBA1C 5.5 10/31/2018   Lab Results  Component Value Date   INSULIN 8.3 02/27/2019   INSULIN 12.3 10/31/2018   Lab Results   Component Value Date   TSH 2.53 03/17/2020   Lab Results  Component Value Date   CHOL 184 10/25/2020   HDL 59 10/25/2020   LDLCALC 116 (H) 10/25/2020   TRIG 45 10/25/2020   CHOLHDL 4.6 (H) 05/14/2020   Lab Results  Component Value Date   WBC 4.8 01/27/2021   HGB 12.2 01/27/2021   HCT 39.3 01/27/2021   MCV 88 01/27/2021   PLT 188 01/27/2021   No results found for: IRON, TIBC, FERRITIN  Attestation Statements:   Reviewed by clinician on day of visit: allergies, medications, problem list, medical history, surgical history, family history, social history, and previous encounter notes.  Coral Ceo, CMA, am acting as transcriptionist for Coralie Common, MD.   I have reviewed the above documentation for accuracy and completeness, and I agree with the above. - Jinny Blossom, MD

## 2021-03-02 ENCOUNTER — Ambulatory Visit (INDEPENDENT_AMBULATORY_CARE_PROVIDER_SITE_OTHER): Payer: PPO | Admitting: Family Medicine

## 2021-03-02 ENCOUNTER — Encounter: Payer: Self-pay | Admitting: Family Medicine

## 2021-03-02 ENCOUNTER — Other Ambulatory Visit: Payer: Self-pay

## 2021-03-02 VITALS — BP 130/78 | HR 75 | Temp 97.1°F | Wt 229.0 lb

## 2021-03-02 DIAGNOSIS — R519 Headache, unspecified: Secondary | ICD-10-CM | POA: Diagnosis not present

## 2021-03-02 DIAGNOSIS — E7849 Other hyperlipidemia: Secondary | ICD-10-CM

## 2021-03-02 DIAGNOSIS — S0083XA Contusion of other part of head, initial encounter: Secondary | ICD-10-CM | POA: Diagnosis not present

## 2021-03-02 DIAGNOSIS — W19XXXA Unspecified fall, initial encounter: Secondary | ICD-10-CM | POA: Diagnosis not present

## 2021-03-02 DIAGNOSIS — F419 Anxiety disorder, unspecified: Secondary | ICD-10-CM

## 2021-03-02 DIAGNOSIS — S060X0A Concussion without loss of consciousness, initial encounter: Secondary | ICD-10-CM

## 2021-03-02 MED ORDER — BUSPIRONE HCL 5 MG PO TABS: 5.0000 mg | ORAL_TABLET | Freq: Two times a day (BID) | ORAL | 5 refills | Status: DC

## 2021-03-02 MED ORDER — ATORVASTATIN CALCIUM 80 MG PO TABS: 80.0000 mg | ORAL_TABLET | Freq: Every day | ORAL | 1 refills | Status: DC

## 2021-03-02 MED ORDER — FAMOTIDINE 40 MG PO TABS: 40.0000 mg | ORAL_TABLET | Freq: Every day | ORAL | 1 refills | Status: DC | PRN

## 2021-03-02 NOTE — Progress Notes (Signed)
Patient ID: [name], female    DOB: 1951-06-10, 70 y.o.   MRN: [id number]   Chief Complaint  Patient presents with   Anxiety   Subjective:    HPI  Pt here for follow up on anxiety. Pt states things are going fairly well. Pt directed to take Buspar 5 mg TID but pt states she has not been taking it TID. Pt usually takes Buspar in the morning; sometimes once in the morning and once in the evening. Pt has tried taking it at night but it made her fall asleep.   Pt fell yesterday and thinks she may have a "concussion." Pt has headache today. Pt states her ankle was caught in a hole and she hit her head on the ground.   Anxiety- Taking buspar 1x per day occ 2x per day. Not needing the evening dose.   Leg swelling has improved.   Fall- Stuckfoot in hole in yard and fell back and hit head and headache in front of head.  No bleeding.  Tightness in neck. Frontal headache today.  No AMS. Had some nausea and no vomiting. Hit head on grass/ground. Got up and able to walk immediately after fall.  Didn't go to ER.  No LOC or amnesia to event.  No blurry vision.   Medical History [name] has a past medical history of Allergic rhinitis, Anxiety, Arthritis, Asthma, Constipation, Depression, Gallbladder problem, Hematuria (08/25/2015), Hyperlipidemia, Hypertension, Obesity, Osteoarthritis, Pelvic pain in female (08/25/2015), Rheumatoid arthritis ([place]), Swallowing difficulty, and Swelling.   Outpatient Encounter Medications as of 03/02/2021  Medication Sig   acetaminophen (TYLENOL) 500 MG tablet Take 500-1,000 mg by mouth every 6 (six) hours as needed (pain).    B Complex Vitamins (VITAMIN B COMPLEX) TABS Take 1 tablet by mouth at bedtime.    chlorzoxazone (PARAFON) 500 MG tablet Take 500 mg by mouth daily as needed for muscle spasms (typically with travel only).   Cholecalciferol (VITAMIN D) 125 MCG (5000 UT) CAPS Take 5,000 Units by mouth daily after breakfast.   Etanercept (ENBREL MINI)  50 MG/ML SOCT Inject 50 mg into the skin once a week.   Magnesium 500 MG CAPS Take 500 mg by mouth.   Probiotic Product (ALIGN PO) Take 1 capsule by mouth daily.    Propylene Glycol (SYSTANE BALANCE OP) Place 1 drop into both eyes 4 (four) times daily as needed (dry/irritated eyes.).    triamcinolone cream (KENALOG) 0.1 % Apply 1 application topically 2 (two) times daily. Prn rash; use up to 2 weeks   [DISCONTINUED] atorvastatin (LIPITOR) 80 MG tablet Take 1 tablet (80 mg total) by mouth See admin instructions. Alternating between 40 mg & 80 mg by mouth every other day in the morning (Patient taking differently: Take 80 mg by mouth daily.)   [DISCONTINUED] busPIRone (BUSPAR) 5 MG tablet TAKE 1 TABLET BY MOUTH THREE TIMES A DAY   [DISCONTINUED] famotidine (PEPCID) 40 MG tablet TAKE 1 TABLET BY MOUTH EVERY DAY AS NEEDED   atorvastatin (LIPITOR) 80 MG tablet Take 1 tablet (80 mg total) by mouth daily.   busPIRone (BUSPAR) 5 MG tablet Take 1 tablet (5 mg total) by mouth 2 (two) times daily.   famotidine (PEPCID) 40 MG tablet Take 1 tablet (40 mg total) by mouth daily as needed.   Facility-Administered Encounter Medications as of 03/02/2021  Medication   methylPREDNISolone acetate (DEPO-MEDROL) injection 40 mg   methylPREDNISolone acetate (DEPO-MEDROL) injection 40 mg     Review of Systems  Constitutional:  Negative for chills and fever.  HENT:  Negative for congestion, rhinorrhea and sore throat.   Respiratory:  Negative for cough, shortness of breath and wheezing.   Cardiovascular:  Negative for chest pain and leg swelling.  Gastrointestinal:  Negative for abdominal pain, diarrhea, nausea and vomiting.  Genitourinary:  Negative for dysuria and frequency.  Musculoskeletal:  Positive for neck pain. Negative for arthralgias and back pain.  Skin:  Negative for rash.  Neurological:  Positive for headaches. Negative for dizziness and weakness.    Vitals BP 130/78   Pulse 75   Temp (!) 97.1 F  (36.2 C)   Wt 229 lb (103.9 kg)   SpO2 98%   BMI 40.57 kg/m   Objective:   Physical Exam Vitals and nursing note reviewed.  Constitutional:      General: She is not in acute distress.    Appearance: Normal appearance. She is not ill-appearing.  HENT:     Head: Normocephalic.     Comments: +ttp on back of head, no hematoma or laceration.    Nose: Nose normal.     Mouth/Throat:     Mouth: Mucous membranes are moist.     Pharynx: Oropharynx is clear.  Eyes:     Extraocular Movements: Extraocular movements intact.     Conjunctiva/sclera: Conjunctivae normal.     Pupils: Pupils are equal, round, and reactive to light.  Cardiovascular:     Rate and Rhythm: Normal rate and regular rhythm.     Pulses: Normal pulses.     Heart sounds: Normal heart sounds.  Pulmonary:     Effort: Pulmonary effort is normal.     Breath sounds: Normal breath sounds. No wheezing, rhonchi or rales.  Musculoskeletal:        General: Normal range of motion.     Cervical back: Neck supple. No tenderness.     Right lower leg: No edema.     Left lower leg: No edema.  Skin:    General: Skin is warm and dry.     Findings: No lesion or rash.  Neurological:     General: No focal deficit present.     Mental Status: She is alert and oriented to person, place, and time.     Cranial Nerves: No cranial nerve deficit.     Sensory: No sensory deficit.     Motor: No weakness.  Psychiatric:        Mood and Affect: Mood normal.        Behavior: Behavior normal.     Assessment and Plan   1. Anxiety - busPIRone (BUSPAR) 5 MG tablet; Take 1 tablet (5 mg total) by mouth 2 (two) times daily.  Dispense: 60 tablet; Refill: 5  2. Other hyperlipidemia - atorvastatin (LIPITOR) 80 MG tablet; Take 1 tablet (80 mg total) by mouth daily.  Dispense: 90 tablet; Refill: 1  3. Fall, initial encounter  4. Acute nonintractable headache, unspecified headache type  5. Contusion of other part of head, initial encounter    Fall- posterior head pain/contusion- Headache- take tylenol, ice, heat, prn- call or rto if not impriving in the next few days. Mild neck tightness-  Heat/ice tylenol prn. Pt to call or rto if not improving.    Anxiety- stable. Cont buspar.  Hld- stable. Cont meds.   Return in about 6 months (around 09/02/2021) for f/u anxiety, hld.

## 2021-03-04 DIAGNOSIS — M9902 Segmental and somatic dysfunction of thoracic region: Secondary | ICD-10-CM | POA: Diagnosis not present

## 2021-03-04 DIAGNOSIS — M542 Cervicalgia: Secondary | ICD-10-CM | POA: Diagnosis not present

## 2021-03-04 DIAGNOSIS — M9901 Segmental and somatic dysfunction of cervical region: Secondary | ICD-10-CM | POA: Diagnosis not present

## 2021-03-04 DIAGNOSIS — M546 Pain in thoracic spine: Secondary | ICD-10-CM | POA: Diagnosis not present

## 2021-03-04 DIAGNOSIS — I10 Essential (primary) hypertension: Secondary | ICD-10-CM | POA: Diagnosis not present

## 2021-03-07 ENCOUNTER — Emergency Department (HOSPITAL_COMMUNITY): Payer: PPO

## 2021-03-07 ENCOUNTER — Encounter (HOSPITAL_COMMUNITY): Payer: Self-pay

## 2021-03-07 ENCOUNTER — Other Ambulatory Visit: Payer: Self-pay

## 2021-03-07 ENCOUNTER — Emergency Department (HOSPITAL_COMMUNITY)
Admission: EM | Admit: 2021-03-07 | Discharge: 2021-03-07 | Disposition: A | Discharge status: Home / Self Care | Payer: PPO | Attending: Emergency Medicine | Admitting: Emergency Medicine

## 2021-03-07 ENCOUNTER — Telehealth: Payer: Self-pay | Admitting: *Deleted

## 2021-03-07 DIAGNOSIS — J45909 Unspecified asthma, uncomplicated: Secondary | ICD-10-CM | POA: Insufficient documentation

## 2021-03-07 DIAGNOSIS — K5900 Constipation, unspecified: Secondary | ICD-10-CM | POA: Diagnosis not present

## 2021-03-07 DIAGNOSIS — I10 Essential (primary) hypertension: Secondary | ICD-10-CM | POA: Diagnosis not present

## 2021-03-07 DIAGNOSIS — R1031 Right lower quadrant pain: Secondary | ICD-10-CM | POA: Diagnosis not present

## 2021-03-07 DIAGNOSIS — Z96653 Presence of artificial knee joint, bilateral: Secondary | ICD-10-CM | POA: Diagnosis not present

## 2021-03-07 DIAGNOSIS — Z85828 Personal history of other malignant neoplasm of skin: Secondary | ICD-10-CM | POA: Diagnosis not present

## 2021-03-07 DIAGNOSIS — R109 Unspecified abdominal pain: Secondary | ICD-10-CM | POA: Diagnosis not present

## 2021-03-07 DIAGNOSIS — N3000 Acute cystitis without hematuria: Secondary | ICD-10-CM | POA: Diagnosis not present

## 2021-03-07 LAB — URINALYSIS, ROUTINE W REFLEX MICROSCOPIC
Bilirubin Urine: NEGATIVE
Glucose, UA: NEGATIVE mg/dL
Hgb urine dipstick: NEGATIVE
Ketones, ur: NEGATIVE mg/dL
Nitrite: NEGATIVE
Protein, ur: NEGATIVE mg/dL
Specific Gravity, Urine: 1.015 (ref 1.005–1.030)
pH: 7 (ref 5.0–8.0)

## 2021-03-07 LAB — COMPREHENSIVE METABOLIC PANEL
ALT: 22 U/L (ref 0–44)
AST: 24 U/L (ref 15–41)
Albumin: 4.2 g/dL (ref 3.5–5.0)
Alkaline Phosphatase: 54 U/L (ref 38–126)
Anion gap: 7 (ref 5–15)
BUN: 20 mg/dL (ref 8–23)
CO2: 27 mmol/L (ref 22–32)
Calcium: 10.1 mg/dL (ref 8.9–10.3)
Chloride: 104 mmol/L (ref 98–111)
Creatinine, Ser: 0.97 mg/dL (ref 0.44–1.00)
GFR, Estimated: >60 mL/min (ref >60)
Glucose, Bld: 104 mg/dL — ABNORMAL HIGH (ref 70–99)
Potassium: 3.8 mmol/L (ref 3.5–5.1)
Sodium: 138 mmol/L (ref 135–145)
Total Bilirubin: 0.8 mg/dL (ref 0.3–1.2)
Total Protein: 6.9 g/dL (ref 6.5–8.1)

## 2021-03-07 LAB — CBC WITH DIFFERENTIAL/PLATELET
Abs Immature Granulocytes: 0.01 10*3/uL (ref 0.00–0.07)
Basophils Absolute: 0 10*3/uL (ref 0.0–0.1)
Basophils Relative: 1 %
Eosinophils Absolute: 0.1 10*3/uL (ref 0.0–0.5)
Eosinophils Relative: 2 %
HCT: 40.6 % (ref 36.0–46.0)
Hemoglobin: 12.5 g/dL (ref 12.0–15.0)
Immature Granulocytes: 0 %
Lymphocytes Relative: 35 %
Lymphs Abs: 1.4 10*3/uL (ref 0.7–4.0)
MCH: 27.1 pg (ref 26.0–34.0)
MCHC: 30.8 g/dL (ref 30.0–36.0)
MCV: 87.9 fL (ref 80.0–100.0)
Monocytes Absolute: 0.3 10*3/uL (ref 0.1–1.0)
Monocytes Relative: 8 %
Neutro Abs: 2.2 10*3/uL (ref 1.7–7.7)
Neutrophils Relative %: 54 %
Platelets: 178 10*3/uL (ref 150–400)
RBC: 4.62 MIL/uL (ref 3.87–5.11)
RDW: 17.3 % — ABNORMAL HIGH (ref 11.5–15.5)
WBC: 4 10*3/uL (ref 4.0–10.5)
nRBC: 0 % (ref 0.0–0.2)

## 2021-03-07 LAB — LIPASE, BLOOD: Lipase: 29 U/L (ref 11–51)

## 2021-03-07 MED ORDER — ONDANSETRON HCL 4 MG/2ML IJ SOLN
4.0000 mg | Freq: Four times a day (QID) | INTRAMUSCULAR | Status: DC | PRN
  Administered 2021-03-07: 4 mg via INTRAVENOUS
  Filled 2021-03-07: qty 2

## 2021-03-07 MED ORDER — CEPHALEXIN 500 MG PO CAPS
500.0000 mg | ORAL_CAPSULE | Freq: Once | ORAL | Status: AC
  Administered 2021-03-07: 500 mg via ORAL
  Filled 2021-03-07: qty 1

## 2021-03-07 MED ORDER — IOHEXOL 300 MG/ML  SOLN
100.0000 mL | Freq: Once | INTRAMUSCULAR | Status: AC | PRN
  Administered 2021-03-07: 100 mL via INTRAVENOUS

## 2021-03-07 MED ORDER — KETOROLAC TROMETHAMINE 30 MG/ML IJ SOLN
15.0000 mg | Freq: Once | INTRAMUSCULAR | Status: AC
  Administered 2021-03-07: 15 mg via INTRAVENOUS
  Filled 2021-03-07: qty 1

## 2021-03-07 MED ORDER — HYDROMORPHONE HCL 1 MG/ML IJ SOLN
0.5000 mg | INTRAMUSCULAR | Status: DC | PRN
  Administered 2021-03-07: 0.5 mg via INTRAVENOUS
  Filled 2021-03-07: qty 1

## 2021-03-07 MED ORDER — CEPHALEXIN 500 MG PO CAPS: 500.0000 mg | ORAL_CAPSULE | Freq: Two times a day (BID) | ORAL | 0 refills | Status: AC

## 2021-03-07 NOTE — Discharge Instructions (Addendum)
Your urine today shows a urinary tract infection. Take Keflex as prescribed and complete the full course. Your CT today shows constipation, take a stool softener and laxitive as needed as directed. Recheck with your doctor in 2 days if not improving. Return to the ER for new or worsening symptoms.

## 2021-03-07 NOTE — Telephone Encounter (Signed)
Yes, ER  for evaluation.   Thx.   Dr. Lovena Le

## 2021-03-07 NOTE — ED Provider Notes (Signed)
[facility] EMERGENCY DEPARTMENT Provider Note   CSN: [id number] Arrival date & time: 03/07/21  0945     History Chief Complaint  Patient presents with   Flank Pain    [name] is a 70 y.o. female.  70 year old female presents with complaint of right flank pain which woke her from her sleep around 2:00 this morning, radiates to her right lower quadrant.  Not associated with nausea, vomiting, changes in bowel or bladder habits.  Pain is not worse with movement.  No history of prior kidney stones.  Prior abdominal surgery includes cholecystectomy.      Past Medical History:  Diagnosis Date   Allergic rhinitis    Anxiety    Arthritis    Asthma    Constipation    Depression    Gallbladder problem    Hematuria 08/25/2015   Hyperlipidemia    Hypertension    Obesity    Osteoarthritis    Pelvic pain in female 08/25/2015   Rheumatoid arthritis ([place])    Swallowing difficulty    Swelling     Patient Active Problem List   Diagnosis Date Noted   Encounter for screening fecal occult blood testing 09/23/2020   Encounter for gynecological examination with Papanicolaou smear of cervix 09/23/2020   Pain of right breast 09/23/2020   Anxiety 09/23/2020   Vitamin D deficiency 07/29/2019   Depression with anxiety 06/19/2019   History of total knee replacement, bilateral 10/30/2018   History of MRSA infection 04/04/2017   High risk medication use 03/02/2017   Hypercalcemia 03/02/2017   Pelvic pain in female 08/25/2015   Hematuria 08/25/2015   History of adenomatous polyp of colon 10/01/2014   Seropositive rheumatoid arthritis of multiple sites ([place]) 07/20/2014   History of juvenile rheumatoid arthritis 07/20/2014   Impaired fasting glucose 07/20/2014   NEOPLASM, SKIN, UNCERTAIN BEHAVIOR 41/66/0630   Unilateral primary osteoarthritis, right knee 09/23/2008   CALF PAIN, RIGHT 09/23/2008   ANXIETY DEPRESSION 08/07/2008   Class 2 severe obesity with serious comorbidity and body  mass index (BMI) of 38.0 to 38.9 in adult ([place]) 12/19/2007   DDD (degenerative disc disease), lumbar 06/27/2007   Osteopenia of both forearms 06/27/2007   HIP PAIN, LEFT 06/18/2007   CONSTIPATION 05/16/2007   Essential hypertension 04/04/2007   Psoriasis 03/07/2007   OSTEOARTHROSIS, GENERALIZED, MULTIPLE SITES 03/07/2007   ANOSMIA 03/07/2007   LIPOMA 02/21/2007   Hyperlipidemia 02/21/2007   ALLERGIC RHINITIS 02/21/2007   ASTHMA 02/21/2007   ARTHRITIS 02/21/2007   KNEE PAIN, LEFT 02/21/2007   LOW BACK PAIN, CHRONIC 02/21/2007    Past Surgical History:  Procedure Laterality Date   BACK SURGERY     CHOLECYSTECTOMY     COLONOSCOPY  2007   sessile sigmoid polyp x 1; path: serrated adenoma   COLONOSCOPY N/A 10/12/2014   Procedure: COLONOSCOPY;  Surgeon: [name], MD;  Location: [facility];  Service: Endoscopy;  Laterality: N/A;   COLONOSCOPY N/A 03/05/2020   Procedure: COLONOSCOPY;  Surgeon: Daneil Dolin, MD;  Location: [facility] ENDO SUITE;  Service: Endoscopy;  Laterality: N/A;  9:30   JOINT REPLACEMENT     BIL knee    KNEE ARTHROPLASTY     KNEE SURGERY     bilateral knee replacement   LIPOMA EXCISION     x 2   TONSILLECTOMY AND ADENOIDECTOMY       OB History     Gravida  5   Para  4   Term  Preterm      AB  1   Living  4      SAB  1   IAB      Ectopic      Multiple      Live Births              Family History  Problem Relation Age of Onset   Heart attack Father    Alcoholism Father    Depression Father    Heart disease Father    Hyperlipidemia Father    Hypertension Father    Leukemia Mother    Hypertension Mother    Cancer Mother    Depression Mother    Obesity Sister    Other Sister        breathing problems   Hepatitis C Sister    Arthritis Sister    Depression Sister    Colon cancer Sister    Squamous cell carcinoma Sister    Emphysema Brother    Obesity Daughter    Polycystic ovary syndrome Daughter    Obesity Son     Other Son        knee problems   Cancer Maternal Grandmother        breast   Other Brother        aneursym   Arthritis Sister    Depression Sister    Squamous cell carcinoma Sister    Obesity Son    Asthma Son    Obesity Son    Diabetes Other        runs on dad's side of the family    Social History   Tobacco Use   Smoking status: Never   Smokeless tobacco: Never  Vaping Use   Vaping Use: Never used  Substance Use Topics   Alcohol use: Yes    Alcohol/week: 0.0 standard drinks    Comment: sometimes; rarely   Drug use: No    Home Medications Prior to Admission medications   Medication Sig Start Date End Date Taking? Authorizing Provider  cephALEXin (KEFLEX) 500 MG capsule Take 1 capsule (500 mg total) by mouth 2 (two) times daily for 5 days. 03/07/21 03/12/21 Yes Tacy Learn, PA-C  acetaminophen (TYLENOL) 500 MG tablet Take 500-1,000 mg by mouth every 6 (six) hours as needed (pain).     [provider]  atorvastatin (LIPITOR) 80 MG tablet Take 1 tablet (80 mg total) by mouth daily. 03/02/21   Elvia Collum M, DO  B Complex Vitamins (VITAMIN B COMPLEX) TABS Take 1 tablet by mouth at bedtime.     [provider]  busPIRone (BUSPAR) 5 MG tablet Take 1 tablet (5 mg total) by mouth 2 (two) times daily. 03/02/21   Erven Colla, DO  chlorzoxazone (PARAFON) 500 MG tablet Take 500 mg by mouth daily as needed for muscle spasms (typically with travel only).    [provider]  Cholecalciferol (VITAMIN D) 125 MCG (5000 UT) CAPS Take 5,000 Units by mouth daily after breakfast.    [provider]  Etanercept (ENBREL MINI) 50 MG/ML SOCT Inject 50 mg into the skin once a week. 11/16/20   Ofilia Neas, PA-C  famotidine (PEPCID) 40 MG tablet Take 1 tablet (40 mg total) by mouth daily as needed. 03/02/21   Elvia Collum M, DO  Magnesium 500 MG CAPS Take 500 mg by mouth.    [provider]  Probiotic Product (ALIGN PO) Take 1 capsule by mouth  daily.  [provider]  Propylene Glycol (SYSTANE BALANCE OP) Place 1 drop into both eyes 4 (four) times daily as needed (dry/irritated eyes.).     [provider]  triamcinolone cream (KENALOG) 0.1 % Apply 1 application topically 2 (two) times daily. Prn rash; use up to 2 weeks 01/07/20   Nilda Simmer, NP    Allergies    Codeine, Cyclosporine, and Propylene glycol  Review of Systems   Review of Systems  Constitutional:  Negative for fever.  Respiratory:  Negative for shortness of breath.   Cardiovascular:  Negative for chest pain.  Gastrointestinal:  Positive for abdominal pain. Negative for constipation, diarrhea, nausea and vomiting.  Genitourinary:  Positive for flank pain. Negative for dysuria and frequency.  Musculoskeletal:  Positive for back pain.  Skin:  Negative for rash and wound.  Allergic/Immunologic: Negative for immunocompromised state.  Neurological:  Negative for weakness.  Hematological:  Negative for adenopathy.  Psychiatric/Behavioral:  Negative for confusion.   All other systems reviewed and are negative.  Physical Exam Updated Vital Signs BP 135/71   Pulse 63   Temp 98.1 F (36.7 C) (Oral)   Resp 11   Ht 5\' 3"  (1.6 m)   Wt 102.1 kg   SpO2 96%   BMI 39.86 kg/m   Physical Exam Vitals and nursing note reviewed.  Constitutional:      General: She is not in acute distress.    Appearance: She is well-developed. She is not diaphoretic.  HENT:     Head: Normocephalic and atraumatic.  Cardiovascular:     Rate and Rhythm: Normal rate and regular rhythm.     Heart sounds: Normal heart sounds.  Pulmonary:     Effort: Pulmonary effort is normal.     Breath sounds: Normal breath sounds.  Abdominal:     Palpations: Abdomen is soft.     Tenderness: There is abdominal tenderness in the right lower quadrant. There is no right CVA tenderness or left CVA tenderness.  Musculoskeletal:        General: No swelling or tenderness.  Skin:     General: Skin is warm and dry.     Findings: No erythema or rash.  Neurological:     Mental Status: She is alert and oriented to person, place, and time.  Psychiatric:        Behavior: Behavior normal.    ED Results / Procedures / Treatments   Labs (all labs ordered are listed, but only abnormal results are displayed) Labs Reviewed  CBC WITH DIFFERENTIAL/PLATELET - Abnormal; Notable for the following components:      Result Value   RDW 17.3 (*)    All other components within normal limits  COMPREHENSIVE METABOLIC PANEL - Abnormal; Notable for the following components:   Glucose, Bld 104 (*)    All other components within normal limits  URINALYSIS, ROUTINE W REFLEX MICROSCOPIC - Abnormal; Notable for the following components:   APPearance HAZY (*)    Leukocytes,Ua LARGE (*)    Bacteria, UA RARE (*)    All other components within normal limits  LIPASE, BLOOD    EKG None  Radiology CT Abdomen Pelvis W Contrast  Result Date: 03/07/2021 CLINICAL DATA:  Right lower quadrant abdominal pain and right flank pain since last night intermittently. EXAM: CT ABDOMEN AND PELVIS WITH CONTRAST TECHNIQUE: Multidetector CT imaging of the abdomen and pelvis was performed using the standard protocol following bolus administration of intravenous contrast. CONTRAST:  126mL OMNIPAQUE IOHEXOL 300 MG/ML  SOLN COMPARISON:  None. FINDINGS: Lower chest: No significant pulmonary nodules or acute consolidative airspace disease. Coronary atherosclerosis. Hepatobiliary: Normal liver size. No liver mass. Cholecystectomy. Mild-to-moderate central intrahepatic biliary ductal dilatation. Dilated common bile duct (14 mm diameter) with smooth distal tapering. No radiopaque choledocholithiasis. Pancreas: Normal, with no mass or duct dilation. Spleen: Normal size. No mass. Adrenals/Urinary Tract: Normal adrenals. No hydronephrosis. No suspicious renal masses. Simple parapelvic renal cysts in the left greater than right  kidneys. Normal bladder. Stomach/Bowel: Small hiatal hernia. Otherwise normal nondistended stomach. Normal caliber small bowel with no small bowel wall thickening. Normal appendix. Moderate diffuse colonic stool. No large bowel wall thickening, diverticulosis or significant pericolonic fat stranding. Vascular/Lymphatic: Atherosclerotic nonaneurysmal abdominal aorta. Patent portal, splenic, hepatic and renal veins. No pathologically enlarged lymph nodes in the abdomen or pelvis. Reproductive: Grossly normal retroverted uterus.  No adnexal mass. Other: No pneumoperitoneum, ascites or focal fluid collection. Musculoskeletal: No aggressive appearing focal osseous lesions. Marked lumbar spondylosis. Bilateral posterior spinal fusion at L4-5. IMPRESSION: 1. No evidence of bowel obstruction or acute bowel inflammation. Normal appendix. 2. Moderate diffuse colonic stool, suggesting constipation. 3. Cholecystectomy. Intrahepatic and extrahepatic biliary ductal dilatation with smooth distal tapering (CBD diameter 14 mm), potentially due to chronic post cholecystectomy effect. No radiopaque choledocholithiasis. Recommend correlation with serum bilirubin levels. If there is clinical concern for acute biliary obstruction, MRI abdomen with MRCP without and with IV contrast could be obtained. 4. Small hiatal hernia. 5. Coronary atherosclerosis. 6. Aortic Atherosclerosis (ICD10-I70.0). Electronically Signed   By: Ilona Sorrel M.D.   On: 03/07/2021 12:15    Procedures Procedures   Medications Ordered in ED Medications  ondansetron (ZOFRAN) injection 4 mg (4 mg Intravenous Given 03/07/21 1219)  HYDROmorphone (DILAUDID) injection 0.5 mg (0.5 mg Intravenous Given 03/07/21 1218)  iohexol (OMNIPAQUE) 300 MG/ML solution 100 mL (100 mLs Intravenous Contrast Given 03/07/21 1132)  cephALEXin (KEFLEX) capsule 500 mg (500 mg Oral Given 03/07/21 1313)  ketorolac (TORADOL) 30 MG/ML injection 15 mg (15 mg Intravenous Given 03/07/21 1314)     ED Course  I have reviewed the triage vital signs and the nursing notes.  Pertinent labs & imaging results that were available during my care of the patient were reviewed by me and considered in my medical decision making (see chart for details).  Clinical Course as of 03/07/21 1315  Mon Mar 07, 4442  1463 70 year old female with right flank pain as above.  On exam found to have right lower quadrant tenderness. CBC and CMP unremarkable, lipase normal limits.  Urinalysis suggestive of urinary tract infection with large leukocytes.  Plan is to treat with Keflex.  CT scan obtained for right lower quadrant abdominal pain, shows normal appendix.  In regards to postcholecystectomy changes, LFTs are normal, normal bili. Recommend recheck with PCP in 2 days if not improving, given return to ED precautions. [LM]    Clinical Course User Index [LM] Roque Lias   MDM Rules/Calculators/A&P                           Final Clinical Impression(s) / ED Diagnoses Final diagnoses:  Flank pain  Acute cystitis without hematuria  Constipation, unspecified constipation type    Rx / DC Orders ED Discharge Orders          Ordered    cephALEXin (KEFLEX) 500 MG capsule  2 times daily        03/07/21 1300  Tacy Learn, PA-C 03/07/21 1315    Milton Ferguson, MD 03/10/21 1004

## 2021-03-07 NOTE — Telephone Encounter (Signed)
Patient called and stated she was having severe, intense right side pain that starts under her breast and goes to her hip- woke her up last night and she gets really hot and shakes from the pain- will ease a bit but continues to return. Patient advised to go straight to ER for evaluation and treatment. Patient verbalized understanding and is heading to the ER.

## 2021-03-07 NOTE — ED Notes (Signed)
Pt states she is feeling a bit nauseated and is having 9/10 pain now in RUQ and right flank area. Requesting prn pain and nausea medications

## 2021-03-07 NOTE — ED Triage Notes (Signed)
Pt presents to ED with complaints of right sided flank pain since last night, comes and goes. No complaints with urinary symptoms.

## 2021-03-07 NOTE — ED Notes (Signed)
Pt reports right sided pain in her flank area that radiates around to her RUQ. Tenderness noted to LLQ of abdomen. Pain started yesterday and pt report some nausea but no vomiting. LBM this morning, normal formed stool. Pt has also noted decreased urination, but no burning. She is aware of need for urine sample

## 2021-03-11 DIAGNOSIS — M9901 Segmental and somatic dysfunction of cervical region: Secondary | ICD-10-CM | POA: Diagnosis not present

## 2021-03-11 DIAGNOSIS — M542 Cervicalgia: Secondary | ICD-10-CM | POA: Diagnosis not present

## 2021-03-11 DIAGNOSIS — M546 Pain in thoracic spine: Secondary | ICD-10-CM | POA: Diagnosis not present

## 2021-03-11 DIAGNOSIS — M9902 Segmental and somatic dysfunction of thoracic region: Secondary | ICD-10-CM | POA: Diagnosis not present

## 2021-03-16 ENCOUNTER — Telehealth (INDEPENDENT_AMBULATORY_CARE_PROVIDER_SITE_OTHER): Payer: PPO | Admitting: Family Medicine

## 2021-03-16 ENCOUNTER — Other Ambulatory Visit: Payer: Self-pay

## 2021-03-16 DIAGNOSIS — N3000 Acute cystitis without hematuria: Secondary | ICD-10-CM | POA: Diagnosis not present

## 2021-03-16 DIAGNOSIS — K5900 Constipation, unspecified: Secondary | ICD-10-CM

## 2021-03-16 DIAGNOSIS — R1011 Right upper quadrant pain: Secondary | ICD-10-CM | POA: Diagnosis not present

## 2021-03-16 NOTE — Progress Notes (Signed)
Patient ID: [name], female    DOB: [birth year], 70 y.o.   MRN: [id number] I connected with  [name] on 03/16/21 by a video enabled telemedicine application and verified that I am speaking with the correct person using two identifiers.   I discussed the limitations of evaluation and management by telemedicine. The patient expressed understanding and agreed to proceed.  Patient location: home  Provider location: in office  I provided 16 minutes of non face - to - face time during this encounter.   Chief Complaint  Patient presents with   follow up ER visit    Questions about recent scan results- renal cyst, hiatal hernia, abd arthrosclerosis, constipation    Subjective:    HPI Right flank pain and seen in ER. Radiating to rt lower quadrant.  Wondered if having right front sharp pain. The uti resolved.  Intermittent pain. Milk of mag works for her and doesn't give her cramps.  1x per week. Only used laxatives when had colonoscopy. Never had problem with constipation. Usually going 1x per day.  On keflex '500mg'$  bid for 7 days.   Reviewed CT abd-  Need to repeat bilirubin levels in fall on her next visit, currently liver enzymes and bilirubin normal on 03/07/21. Seeing moderate diffuse colonic stool, suggestion constipation.  Rt flank is better now, but does occ having intermittent rt upper quad sharp pain.   Medical History [name] has a past medical history of Allergic rhinitis, Anxiety, Arthritis, Asthma, Constipation, Depression, Gallbladder problem, Hematuria (08/25/2015), Hyperlipidemia, Hypertension, Obesity, Osteoarthritis, Pelvic pain in female (08/25/2015), Rheumatoid arthritis ([place]), Swallowing difficulty, and Swelling.   Outpatient Encounter Medications as of 03/16/2021  Medication Sig   acetaminophen (TYLENOL) 500 MG tablet Take 500-1,000 mg by mouth every 6 (six) hours as needed (pain).    atorvastatin (LIPITOR) 80 MG tablet Take 1 tablet (80 mg  total) by mouth daily.   B Complex Vitamins (VITAMIN B COMPLEX) TABS Take 1 tablet by mouth at bedtime.    chlorzoxazone (PARAFON) 500 MG tablet Take 500 mg by mouth daily as needed for muscle spasms (typically with travel only).   Cholecalciferol (VITAMIN D) 125 MCG (5000 UT) CAPS Take 5,000 Units by mouth daily after breakfast.   famotidine (PEPCID) 40 MG tablet Take 1 tablet (40 mg total) by mouth daily as needed.   Magnesium 500 MG CAPS Take 500 mg by mouth.   Probiotic Product (ALIGN PO) Take 1 capsule by mouth daily.    Propylene Glycol (SYSTANE BALANCE OP) Place 1 drop into both eyes 4 (four) times daily as needed (dry/irritated eyes.).    triamcinolone cream (KENALOG) 0.1 % Apply 1 application topically 2 (two) times daily. Prn rash; use up to 2 weeks   [DISCONTINUED] busPIRone (BUSPAR) 5 MG tablet Take 1 tablet (5 mg total) by mouth 2 (two) times daily.   [DISCONTINUED] Etanercept (ENBREL MINI) 50 MG/ML SOCT Inject 50 mg into the skin once a week.   Facility-Administered Encounter Medications as of 03/16/2021  Medication   methylPREDNISolone acetate (DEPO-MEDROL) injection 40 mg   methylPREDNISolone acetate (DEPO-MEDROL) injection 40 mg     Review of Systems  Constitutional:  Negative for chills and fever.  HENT:  Negative for congestion, rhinorrhea and sore throat.   Respiratory:  Negative for cough, shortness of breath and wheezing.   Cardiovascular:  Negative for chest pain and leg swelling.  Gastrointestinal:  Positive for constipation. Negative for abdominal pain, diarrhea, nausea and vomiting.  Genitourinary:  Positive  for flank pain (rt). Negative for dysuria and frequency.  Musculoskeletal:  Negative for arthralgias and back pain.  Skin:  Negative for rash.  Neurological:  Negative for dizziness, weakness and headaches.    Vitals There were no vitals taken for this visit.  Objective:   Physical Exam No PE due to phone visit.  Assessment and Plan   1. Right  upper quadrant abdominal pain  2. Constipation, unspecified constipation type  3. Acute cystitis without hematuria   Improved, will try to use milk of magnesia to help with constipation and increase water intake since eating more fiber I the diet.  Uti- improving and finished keflex.  Small hiatal hernia- cont reflux meds.  Pt to call or rto for in per son exam if not improving.  Pt in agreement.  Return if symptoms worsen or fail to improve.

## 2021-03-18 DIAGNOSIS — M546 Pain in thoracic spine: Secondary | ICD-10-CM | POA: Diagnosis not present

## 2021-03-18 DIAGNOSIS — M9902 Segmental and somatic dysfunction of thoracic region: Secondary | ICD-10-CM | POA: Diagnosis not present

## 2021-03-18 DIAGNOSIS — M9901 Segmental and somatic dysfunction of cervical region: Secondary | ICD-10-CM | POA: Diagnosis not present

## 2021-03-18 DIAGNOSIS — M542 Cervicalgia: Secondary | ICD-10-CM | POA: Diagnosis not present

## 2021-03-21 IMAGING — MG DIGITAL SCREENING BILAT W/ TOMO W/ CAD
8 series · 8 of 24 positions shown · non-contrast
Comparison: None

CLINICAL DATA: Screening.

EXAM:
DIGITAL SCREENING BILATERAL MAMMOGRAM WITH TOMO AND CAD

[R CC synth-2D]
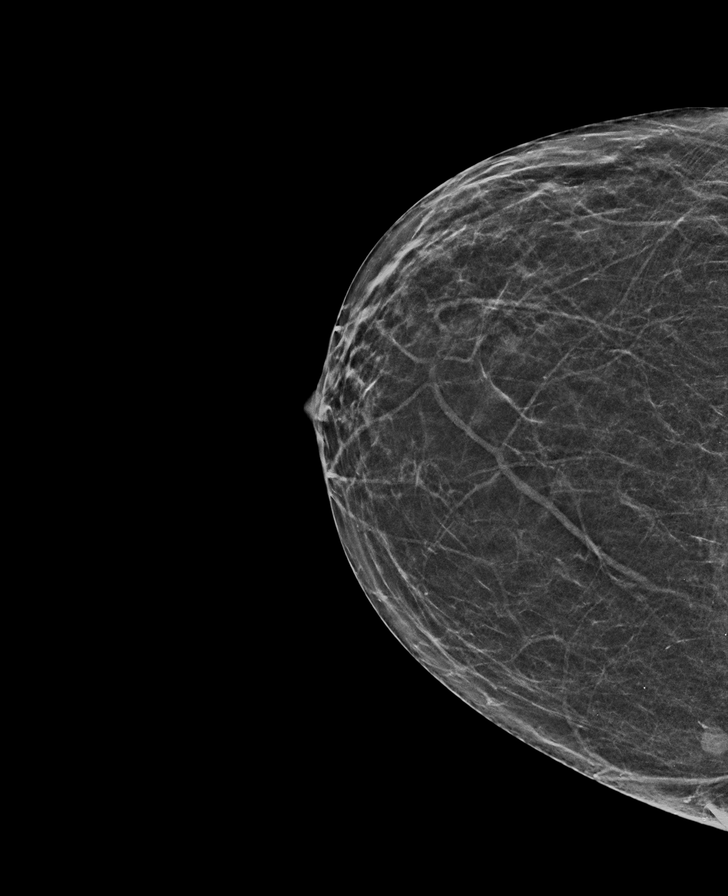

[R MLO synth-2D]
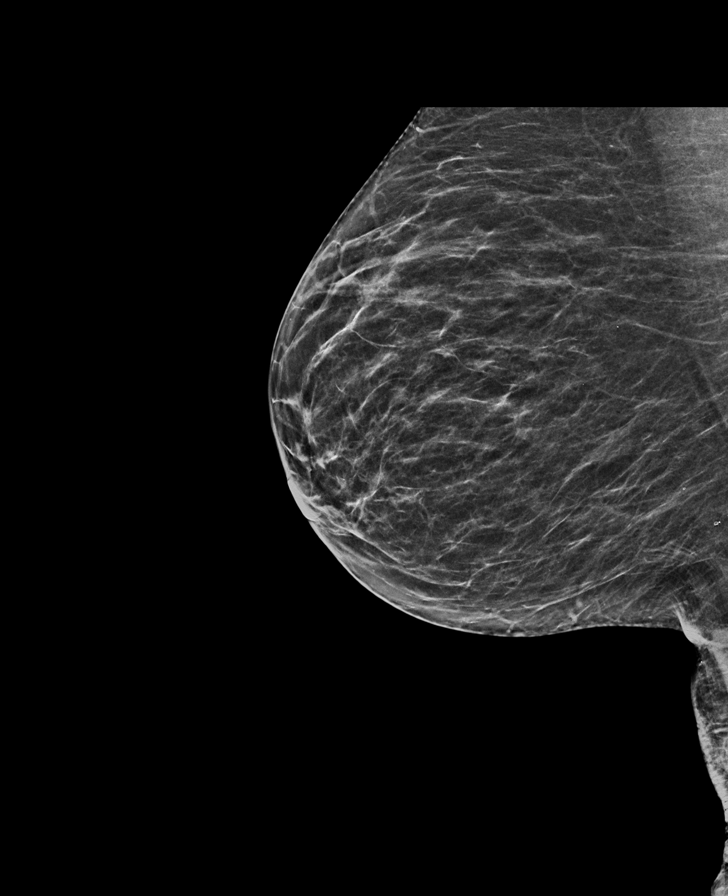

[L CC synth-2D]
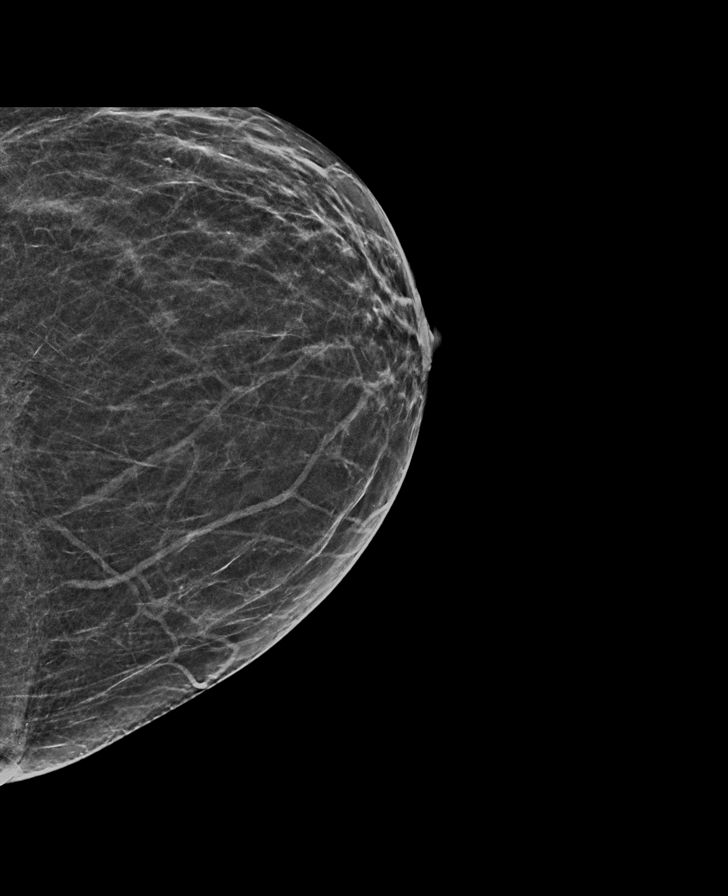

[L MLO synth-2D]
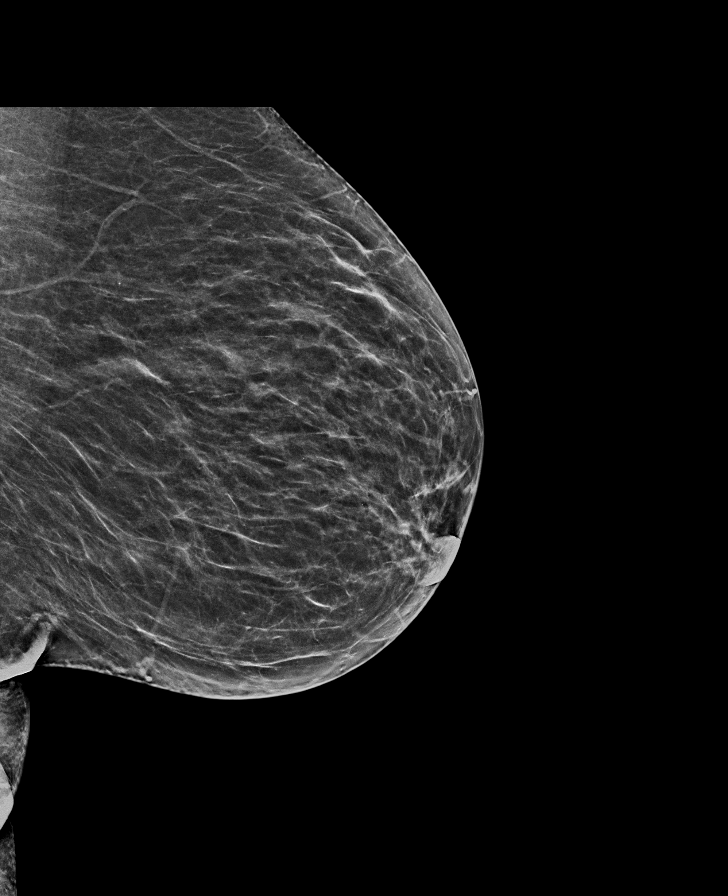

[L MLO tomo · tomo slice 29/58.0]
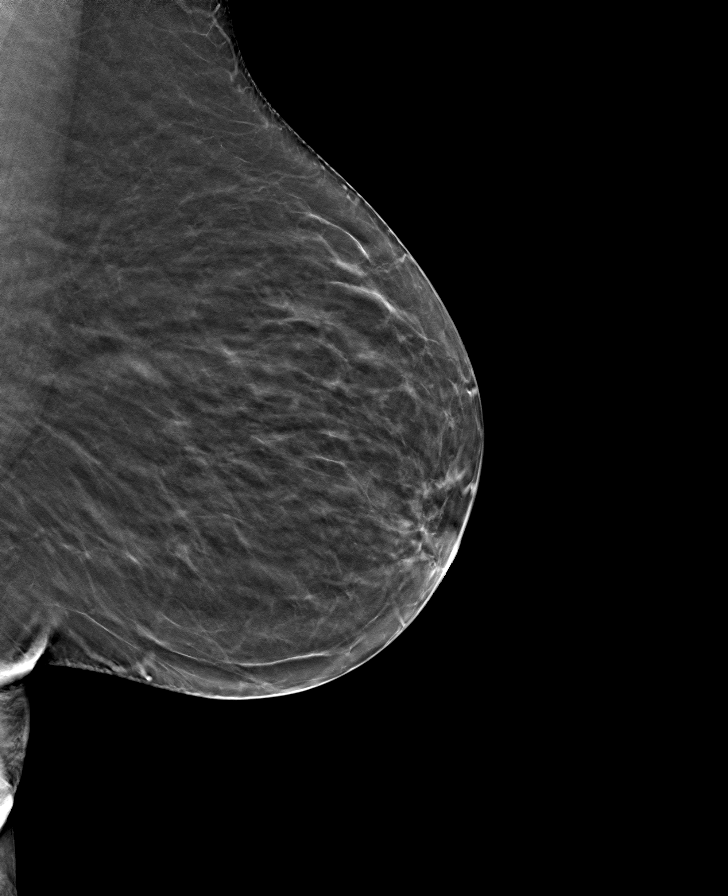

[R CC tomo · tomo slice 26/51.0]
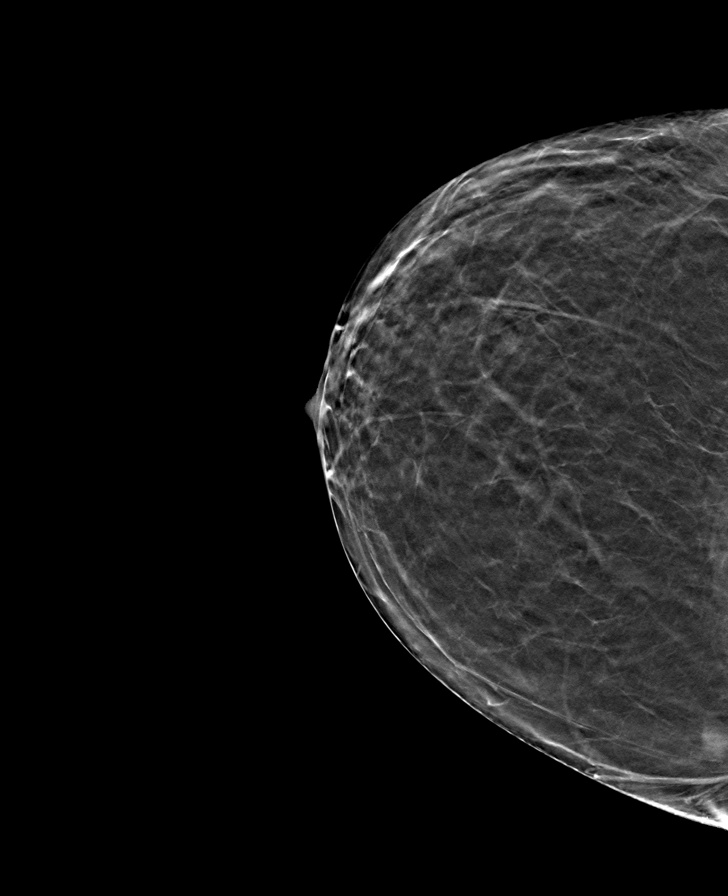

[L CC tomo · tomo slice 28/55.0]
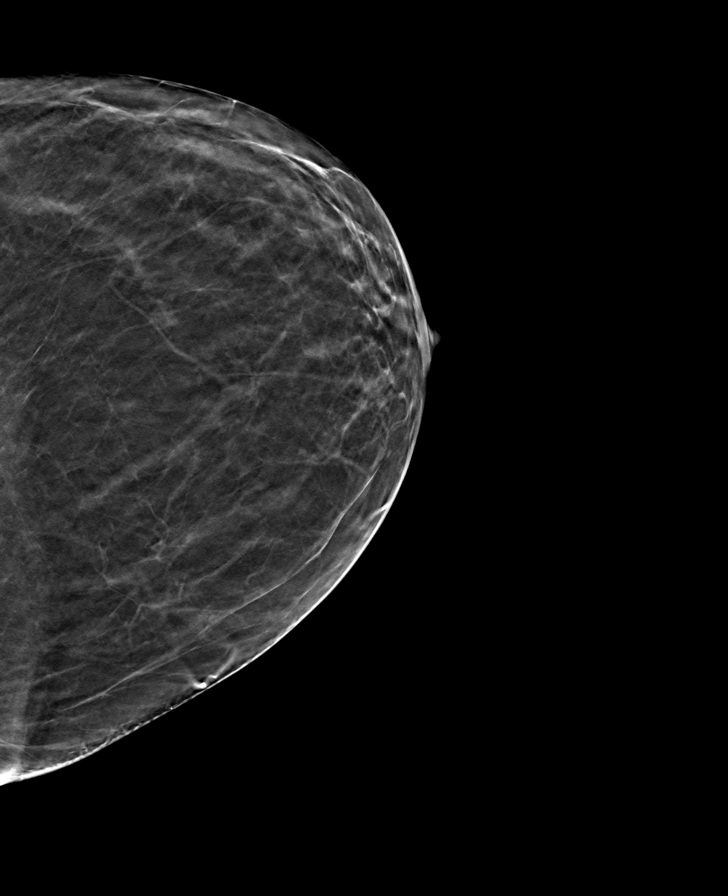

[R MLO tomo · tomo slice 29/58.0]
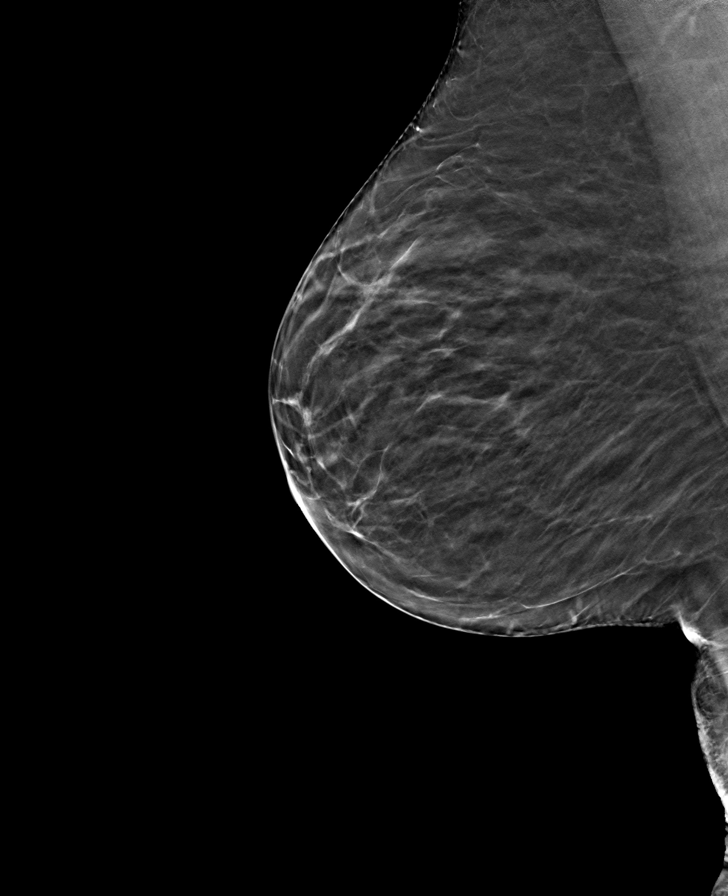

[8 of 24 positions shown; findings below may reference images not displayed]

ACR Breast Density Category b: There are scattered areas of
fibroglandular density.
FINDINGS: In the right breast, a possible mass warrants further evaluation. In
the left breast, no findings suspicious for malignancy. Images were
processed with CAD.
IMPRESSION: Further evaluation is suggested for possible mass in the right
breast.

RECOMMENDATION:
Diagnostic mammogram and possibly ultrasound of the right breast.
(Code:[id number])

The patient will be contacted regarding the findings, and additional
imaging will be scheduled.

BI-RADS CATEGORY  0: Incomplete. Need additional imaging evaluation
and/or prior mammograms for comparison.

## 2021-03-26 ENCOUNTER — Other Ambulatory Visit: Payer: Self-pay | Admitting: Family Medicine

## 2021-03-26 DIAGNOSIS — F419 Anxiety disorder, unspecified: Secondary | ICD-10-CM

## 2021-03-31 ENCOUNTER — Encounter: Payer: Self-pay | Admitting: Rheumatology

## 2021-03-31 DIAGNOSIS — M0579 Rheumatoid arthritis with rheumatoid factor of multiple sites without organ or systems involvement: Secondary | ICD-10-CM

## 2021-03-31 MED ORDER — ENBREL MINI 50 MG/ML ~~LOC~~ SOCT: 50.0000 mg | SUBCUTANEOUS | 0 refills | Status: DC

## 2021-03-31 NOTE — Telephone Encounter (Signed)
Next Visit: 07/04/2021  Last Visit: 01/31/2021  Last Fill: 11/16/2020  [id number] rheumatoid arthritis of multiple sites   Current Dose per office note 01/31/2021: Enbrel 50 mg sq injections once weekly  Labs: 03/07/2021 RDW 17.3, Glucose 104  TB Gold: 01/27/2021 Neg    Okay to refill Enbrel?

## 2021-04-07 DIAGNOSIS — L820 Inflamed seborrheic keratosis: Secondary | ICD-10-CM | POA: Diagnosis not present

## 2021-04-07 DIAGNOSIS — D225 Melanocytic nevi of trunk: Secondary | ICD-10-CM | POA: Diagnosis not present

## 2021-04-07 DIAGNOSIS — L0202 Furuncle of face: Secondary | ICD-10-CM | POA: Diagnosis not present

## 2021-04-18 ENCOUNTER — Encounter (INDEPENDENT_AMBULATORY_CARE_PROVIDER_SITE_OTHER): Payer: Self-pay | Admitting: Family Medicine

## 2021-04-18 ENCOUNTER — Other Ambulatory Visit: Payer: Self-pay

## 2021-04-18 ENCOUNTER — Ambulatory Visit (INDEPENDENT_AMBULATORY_CARE_PROVIDER_SITE_OTHER): Payer: PPO | Admitting: Family Medicine

## 2021-04-18 VITALS — BP 126/74 | HR 66 | Temp 97.7°F | Ht 63.0 in | Wt 220.0 lb

## 2021-04-18 DIAGNOSIS — E7849 Other hyperlipidemia: Secondary | ICD-10-CM | POA: Diagnosis not present

## 2021-04-18 DIAGNOSIS — K5909 Other constipation: Secondary | ICD-10-CM

## 2021-04-18 DIAGNOSIS — Z6841 Body Mass Index (BMI) 40.0 and over, adult: Secondary | ICD-10-CM | POA: Diagnosis not present

## 2021-04-18 NOTE — Progress Notes (Signed)
Chief Complaint:   OBESITY Neeya is here to discuss her progress with her obesity treatment plan along with follow-up of her obesity related diagnoses. Rakel is on keeping a food journal and adhering to recommended goals of 1450-1500 calories and 90 grams protein and states she is following her eating plan approximately 50% of the time. Lizza states she is doing Tai Chi and chair exercises 60 minutes 1 times per week.  Today's visit was #: 71 Starting weight: 267 lbs Starting date: 10/31/2018 Today's weight: 220 lbs Today's date: 04/18/2021 Total lbs lost to date: 47 Total lbs lost since last in-office visit: 0  Interim History: Sora has started [place] and chair exercises (Tai Chi for help with balance). Her sister recently moved in with pt and her husband, and pt thinks that will be helpful with staying on plan. Ultimately, Zameria would like to lose a little bit more but is also content where she is. She is not hitting calorie goals as frequently as she was used to- eating ~1100 calories and ~90 grams.  Subjective:   1. Other constipation Amariona had an emergency room visit for constipation on 03/07/21. She is taking Milk of Magnesia weekly.  2. Other hyperlipidemia She is on Lipitor with no GI side effects or myalgias. Pt's last LDL was 116.  Assessment/Plan:   1. Other constipation Nylee is to start daily Miralax. was informed that a decrease in bowel movement frequency is normal while losing weight, but stools should not be hard or painful. Orders and follow up as documented in patient record.   Counseling Getting to Good Bowel Health: Your goal is to have one soft bowel movement each day. Drink at least 8 glasses of water each day. Eat plenty of fiber (goal is over 25 grams each day). It is best to get most of your fiber from dietary sources which includes leafy green vegetables, fresh fruit, and whole grains. You may need to add fiber with the help of OTC fiber  supplements. These include Metamucil, Citrucel, and Flaxseed. If you are still having trouble, try adding Miralax or Magnesium Citrate. If all of these changes do not work, Cab[employer].  2. Other hyperlipidemia Continue statin therapy. Cardiovascular risk and specific lipid/LDL goals reviewed.  We discussed several lifestyle modifications today and Sayre will continue to work on diet, exercise and weight loss efforts. Orders and follow up as documented in patient record.   Counseling Intensive lifestyle modifications are the first line treatment for this issue. Dietary changes: Increase soluble fiber. Decrease simple carbohydrates. Exercise changes: Moderate to vigorous-intensity aerobic activity 150 minutes per week if tolerated. Lipid-lowering medications: see documented in medical record.  3. Obesity with current BMI of 39.0  Dejanee is currently in the action stage of change. As such, her goal is to continue with weight loss efforts. She has agreed to keeping a food journal and adhering to recommended goals of 1480-1500 calories and 90+ grams protein.   Exercise goals:  As is  Behavioral modification strategies: increasing lean protein intake, meal planning and cooking strategies, and keeping healthy foods in the home.  Nakayla has agreed to follow-up with our clinic in 12 weeks. She was informed of the importance of frequent follow-up visits to maximize her success with intensive lifestyle modifications for her multiple health conditions.   Objective:   Blood pressure 126/74, pulse 66, temperature 97.7 F (36.5 C), height '5\' 3"'  (1.6 m), weight 220 lb (99.8 kg), SpO2 99 %.  Body mass index is 38.97 kg/m.  General: Cooperative, alert, well developed, in no acute distress. HEENT: Conjunctivae and lids unremarkable. Cardiovascular: Regular rhythm.  Lungs: Normal work of breathing. Neurologic: No focal deficits.   Lab Results  Component Value Date   CREATININE 0.97  03/07/2021   BUN 20 03/07/2021   NA 138 03/07/2021   K 3.8 03/07/2021   CL 104 03/07/2021   CO2 27 03/07/2021   Lab Results  Component Value Date   ALT 22 03/07/2021   AST 24 03/07/2021   ALKPHOS 54 03/07/2021   BILITOT 0.8 03/07/2021   Lab Results  Component Value Date   HGBA1C 5.4 02/27/2019   HGBA1C 5.5 10/31/2018   Lab Results  Component Value Date   INSULIN 8.3 02/27/2019   INSULIN 12.3 10/31/2018   Lab Results  Component Value Date   TSH 2.53 03/17/2020   Lab Results  Component Value Date   CHOL 184 10/25/2020   HDL 59 10/25/2020   LDLCALC 116 (H) 10/25/2020   TRIG 45 10/25/2020   CHOLHDL 4.6 (H) 05/14/2020   Lab Results  Component Value Date   VD25OH 32.7 09/29/2020   VD25OH 49.1 03/09/2020   VD25OH 41.6 02/27/2019   Lab Results  Component Value Date   WBC 4.0 03/07/2021   HGB 12.5 03/07/2021   HCT 40.6 03/07/2021   MCV 87.9 03/07/2021   PLT 178 03/07/2021    Attestation Statements:   Reviewed by clinician on day of visit: allergies, medications, problem list, medical history, surgical history, family history, social history, and previous encounter notes.  Time spent on visit including pre-visit chart review and post-visit care and charting was 12 minutes.   Coral Ceo, CMA, am acting as transcriptionist for Coralie Common, MD.  I have reviewed the above documentation for accuracy and completeness, and I agree with the above. - Coralie Common, MD

## 2021-05-02 ENCOUNTER — Other Ambulatory Visit: Payer: Self-pay | Admitting: Family Medicine

## 2021-05-02 DIAGNOSIS — F419 Anxiety disorder, unspecified: Secondary | ICD-10-CM

## 2021-06-20 NOTE — Progress Notes (Signed)
Office Visit Note  Patient: [name]             Date of Birth: [birth year]           MRN: [id number]             PCP: Coral Spikes, DO Referring: Erven Colla, DO Visit Date: 07/04/2021 Occupation: @GUAROCC @  Subjective:  Medication monitoring   History of Present Illness: [name] is a 70 y.o. female with history of seropositive rheumatoid arthritis and osteoarthritis.  Patient is currently on Enbrel 50 mg sq injections once weekly.  She recently had to interruptions in therapy due to being treated for a UTI x2.  She states that she has not had any other recent infections.  She received the flu vaccine as well as the COVID booster recently.  Patient reports that she continues to experience intermittent arthralgias and joint stiffness but denies any joint swelling.  She is currently experiencing discomfort in her right shoulder and would like a right shoulder cortisone injection today.  Her last right shoulder injection was in June 2022 which provided significant relief.  She states that her left ankle joint swells intermittently but denies any pain at this time.  She has noticed some increased arthralgias and joint stiffness with colder weather temperatures.  She states both knee replacements are doing well.  She denies any recent falls.  She continues to take vitamin D on a daily basis and obtains calcium through her diet.   Activities of Daily Living:  Patient reports morning stiffness for 1 hour.   Patient Denies nocturnal pain.  Difficulty dressing/grooming: Denies Difficulty climbing stairs: Denies Difficulty getting out of chair: Denies Difficulty using hands for taps, buttons, cutlery, and/or writing: Reports  Review of Systems  Constitutional:  Positive for fatigue.  HENT:  Positive for mouth dryness and nose dryness. Negative for mouth sores.   Eyes:  Positive for pain and dryness. Negative for itching and visual disturbance.  Respiratory:  Negative for  cough, hemoptysis, shortness of breath and difficulty breathing.   Cardiovascular:  Negative for chest pain, palpitations, hypertension and swelling in legs/feet.  Gastrointestinal:  Negative for blood in stool, constipation and diarrhea.  Endocrine: Negative for increased urination.  Genitourinary:  Negative for difficulty urinating and painful urination.  Musculoskeletal:  Positive for joint pain, joint pain, myalgias, morning stiffness, muscle tenderness and myalgias. Negative for joint swelling and muscle weakness.  Skin:  Negative for color change, pallor, rash, hair loss, nodules/bumps, redness, skin tightness, ulcers and sensitivity to sunlight.  Allergic/Immunologic: Positive for susceptible to infections.  Neurological:  Positive for headaches. Negative for weakness.  Hematological:  Positive for bruising/bleeding tendency. Negative for swollen glands.  Psychiatric/Behavioral:  Negative for depressed mood, confusion and sleep disturbance. The patient is not nervous/anxious.    PMFS History:  Patient Active Problem List   Diagnosis Date Noted   Anxiety 06/28/2021   Skin cyst 06/28/2021   GERD (gastroesophageal reflux disease) 06/28/2021   Vitamin D deficiency 07/29/2019   History of total knee replacement, bilateral 10/30/2018   History of MRSA infection 04/04/2017   Hypercalcemia 03/02/2017   History of adenomatous polyp of colon 10/01/2014   Seropositive rheumatoid arthritis of multiple sites ([place]) 07/20/2014   Obesity (BMI 30-39.9) 12/19/2007   DDD (degenerative disc disease), lumbar 06/27/2007   Osteopenia of both forearms 06/27/2007   Essential hypertension 04/04/2007   Hyperlipidemia 02/21/2007    Past Medical History:  Diagnosis Date   Allergic  rhinitis    Anxiety    Arthritis    Asthma    Constipation    Depression    Gallbladder problem    Hematuria 08/25/2015   Hyperlipidemia    Hypertension    Impetigo    in nose per patient, dx by derm.   Obesity     Osteoarthritis    Pelvic pain in female 08/25/2015   Rheumatoid arthritis ([place])    Swallowing difficulty    Swelling     Family History  Problem Relation Age of Onset   Heart attack Father    Alcoholism Father    Depression Father    Heart disease Father    Hyperlipidemia Father    Hypertension Father    Leukemia Mother    Hypertension Mother    Cancer Mother    Depression Mother    Obesity Sister    Other Sister        breathing problems   Hepatitis C Sister    Arthritis Sister    Depression Sister    Colon cancer Sister    Squamous cell carcinoma Sister    Emphysema Brother    Obesity Daughter    Polycystic ovary syndrome Daughter    Obesity Son    Other Son        knee problems   Cancer Maternal Grandmother        breast   Other Brother        aneursym   Arthritis Sister    Depression Sister    Squamous cell carcinoma Sister    Obesity Son    Asthma Son    Obesity Son    Diabetes Other        runs on dad's side of the family   Past Surgical History:  Procedure Laterality Date   BACK SURGERY     CHOLECYSTECTOMY     COLONOSCOPY  2007   sessile sigmoid polyp x 1; path: serrated adenoma   COLONOSCOPY N/A 10/12/2014   Procedure: COLONOSCOPY;  Surgeon: [name], MD;  [facility]: [facility];  Service: Endoscopy;  Laterality: N/A;   COLONOSCOPY N/A 03/05/2020   Procedure: COLONOSCOPY;  Surgeon: Daneil Dolin, MD;  Location: [facility];  Service: Endoscopy;  Laterality: N/A;  9:30   JOINT REPLACEMENT     BIL knee    KNEE ARTHROPLASTY     KNEE SURGERY     bilateral knee replacement   LIPOMA EXCISION     x 2   TONSILLECTOMY AND ADENOIDECTOMY     Social History   Social History Narrative   Not on file   Immunization History  Administered Date(s) Administered   Fluad Quad(high Dose 65+) 05/27/2020   Influenza Split 06/17/2013   Influenza Whole 06/27/2007, 05/08/2008   Influenza, High Dose Seasonal PF 06/14/2017, 06/18/2018    Influenza,inj,Quad PF,6+ Mos 07/12/2015   Influenza-Unspecified 06/14/2017, 06/18/2018, 05/11/2019, 05/14/2021   PFIZER(Purple Top)SARS-COV-2 Vaccination 10/12/2019, 11/04/2019, 06/03/2020, 05/23/2021   Pneumococcal Polysaccharide-23 02/18/1993, 06/27/2007   Zoster Recombinat (Shingrix) 08/19/2018     Objective: Vital Signs: BP 125/82 (BP Location: Left Arm, Patient Position: Sitting, Cuff Size: Large)   Pulse 62   Ht 5\' 4"  (1.626 m)   Wt 226 lb (102.5 kg)   BMI 38.79 kg/m    Physical Exam Vitals and nursing note reviewed.  Constitutional:      Appearance: She is well-developed.  HENT:     Head: Normocephalic and atraumatic.  Eyes:  Conjunctiva/sclera: Conjunctivae normal.  Pulmonary:     Effort: Pulmonary effort is normal.  Abdominal:     Palpations: Abdomen is soft.  Musculoskeletal:     Cervical back: Normal range of motion.  Skin:    General: Skin is warm and dry.     Capillary Refill: Capillary refill takes less than 2 seconds.  Neurological:     Mental Status: She is alert and oriented to person, place, and time.  Psychiatric:        Behavior: Behavior normal.     Musculoskeletal Exam: C-spine has good range of motion with no discomfort.  Shoulder joints have good range of motion with some discomfort in the right shoulder.  Tenderness to palpation of the right glenohumeral joint and over the right AC joint.  Elbow joints have good range of motion with no tenderness or inflammation.  Wrist joints, MCPs, PIPs, DIPs have good range of motion with no synovitis.  Some synovial thickening over the right wrist and MCP joints but no inflammation noted.  PIP and DIP thickening consistent with osteoarthritis of both hands.  Hip joints have good range of motion with no discomfort.  Bilateral knee replacements have good range of motion with no discomfort.  Ankle joints have good ROM with no tenderness  or joint swelling.   CDAI Exam: CDAI Score: 0.4  Patient Global: 2 mm;  Provider Global: 2 mm Swollen: 0 ; Tender: 0  Joint Exam 07/04/2021   No joint exam has been documented for this visit   There is currently no information documented on the homunculus. Go to the Rheumatology activity and complete the homunculus joint exam.  Investigation: No additional findings.  Imaging: No results found.  Recent Labs: Lab Results  Component Value Date   WBC 4.5 06/30/2021   HGB 12.6 06/30/2021   PLT 216 06/30/2021   NA 142 06/28/2021   K 4.5 06/28/2021   CL 104 06/28/2021   CO2 24 06/28/2021   GLUCOSE 94 06/28/2021   BUN 17 06/28/2021   CREATININE 0.85 06/28/2021   BILITOT 0.7 06/28/2021   ALKPHOS 83 06/28/2021   AST 22 06/28/2021   ALT 15 06/28/2021   PROT 6.7 06/28/2021   ALBUMIN 4.6 06/28/2021   CALCIUM 10.2 06/28/2021   GFRAA 73 07/23/2020   QFTBGOLDPLUS Negative 01/27/2021    Speciality Comments: PLQ Eye Exam: 12/03/2019 WNL @ [facility] Follow up in 1 year MTX -dcd 09/19 Enbrel start date 03/10/20  Procedures:  Large Joint Inj: R glenohumeral on 07/04/2021 8:41 AM Indications: pain Details: 27 G 1.5 in needle, posterior approach  Arthrogram: No  Medications: 1.5 mL lidocaine 1 %; 40 mg triamcinolone acetonide 40 MG/ML Aspirate: 0 mL Outcome: tolerated well, no immediate complications Procedure, treatment alternatives, risks and benefits explained, specific risks discussed. Consent was given by the patient. Immediately prior to procedure a time out was called to verify the correct patient, procedure, equipment, support staff and site/side marked as required. Patient was prepped and draped in the usual sterile fashion.    Allergies: Codeine, Cyclosporine, and Propylene glycol   Assessment / Plan:     Visit Diagnoses: Seropositive rheumatoid arthritis of multiple sites ([place]) -  positive RF, positive anti-CCP. Previous patient of Dr. Charlestine Night with multiple contractures, ultrasound of both hands on 10/30/18 revealed synovitis: She had no  synovitis on examination today.  Overall she has clinically been doing well on Enbrel 50 mg sq injections once weekly.  She experiences intermittent arthralgias and joint stiffness  especially with colder weather temperatures.  She presents today with increased pain in the right shoulder joint.  X-rays of the right shoulder were updated and the right glenohumeral joint was injected with cortisone as requested.  X-rays of both hands and feet were also updated today to assess for radiographic progression.  No interval changes noted since 2018.  She will remain on Enbrel 50 mg sq injections once weekly as monotherapy.  She was advised to notify us if she develops increased joint pain or joint swelling.  She will follow-up in the office in 5 months.- Plan: XR Foot 2 Views Left, XR Foot 2 Views Right, XR Hand 2 View Left, XR Hand 2 View Right, XR Shoulder Right  High risk medication use - Enbrel 50 mg sq injections once weekly-started July 2021. D/c MTX and PLQ.  CBC and CMP within normal limits on 06/28/2021.  Lab work will be due in February and every 3 months to monitor for drug toxicity.  Standing orders for CBC and CMP remain in place.  TB Gold negative on 01/27/2021. She was treated for UTI x2 recently.  She held Enbrel while on antibiotics and until her symptoms had completely cleared.  Discussed the importance of holding Enbrel anytime she develops signs or symptoms of an infection and to resume once the infection is completely cleared.  She voiced understanding. She has received the annual influenza vaccination as well as the COVID-19 booster.  Chronic right shoulder pain - She presents today with ongoing pain in the right shoulder joint.  No recent injury.  Her discomfort is most severe with ROM exercises.  She had a right glenohumeral joint injection on 01/31/2021 which provided significant relief but her discomfort has gradually returned.  X-rays of the right shoulder were updated today.  After informed  consent the right glenohumeral joint was injected with cortisone as requested.  She tolerated procedure well.  Procedure note was completed above.  Aftercare was discussed.  She was advised to notify us if her discomfort persists or worsens.  Plan: XR Shoulder Right, Large Joint Inj: R glenohumeral  History of total knee replacement, bilateral: Doing well.  She has good range of motion with no discomfort at this time.  No warmth or effusion was noted.  Trochanteric bursitis of right hip - Resolved  DDD (degenerative disc disease), lumbar - S/p fusion: Chronic pain.  No symptoms of radiculopathy at this time.  Osteopenia of multiple sites - DEXA 01/12/20: The BMD measured at Forearm Radius 33% is 0.555 g/cm2 with a T-score of -2.2.  She is taking vitamin D supplement daily and obtains calcium through her diet.  No recent falls or fractures.  Other medical conditions are listed as follows:  Essential hypertension: Blood pressure was 125/82 today in the office.  Discussed the importance of monitoring blood pressure closely following the cortisone injection.  History of hyperlipidemia  History of asthma  History of adenomatous polyp of colon  History of depression  History of anxiety  History of MRSA infection  Orders: Orders Placed This Encounter  Procedures   Large Joint Inj: R glenohumeral   XR Foot 2 Views Left   XR Foot 2 Views Right   XR Hand 2 View Left   XR Hand 2 View Right   XR Shoulder Right   No orders of the defined types were placed in this encounter.     Follow-Up Instructions: Return in about 5 months (around 12/02/2021) for Rheumatoid arthritis.   Ofilia Neas,  PA-C  Note - This record has been created using [employer].  Chart creation errors have been sought, but may not always  have been located. Such creation errors do not reflect on  the standard of medical care.

## 2021-06-28 ENCOUNTER — Ambulatory Visit (INDEPENDENT_AMBULATORY_CARE_PROVIDER_SITE_OTHER): Payer: PPO | Admitting: Family Medicine

## 2021-06-28 ENCOUNTER — Other Ambulatory Visit: Payer: Self-pay

## 2021-06-28 VITALS — BP 131/79 | HR 63 | Temp 97.2°F | Ht 63.0 in | Wt 223.0 lb

## 2021-06-28 DIAGNOSIS — I10 Essential (primary) hypertension: Secondary | ICD-10-CM | POA: Diagnosis not present

## 2021-06-28 DIAGNOSIS — E7849 Other hyperlipidemia: Secondary | ICD-10-CM | POA: Diagnosis not present

## 2021-06-28 DIAGNOSIS — Z79899 Other long term (current) drug therapy: Secondary | ICD-10-CM | POA: Diagnosis not present

## 2021-06-28 DIAGNOSIS — K219 Gastro-esophageal reflux disease without esophagitis: Secondary | ICD-10-CM

## 2021-06-28 DIAGNOSIS — L729 Follicular cyst of the skin and subcutaneous tissue, unspecified: Secondary | ICD-10-CM | POA: Diagnosis not present

## 2021-06-28 DIAGNOSIS — F419 Anxiety disorder, unspecified: Secondary | ICD-10-CM

## 2021-06-28 MED ORDER — FAMOTIDINE 40 MG PO TABS: 40.0000 mg | ORAL_TABLET | Freq: Every day | ORAL | 1 refills | Status: DC | PRN

## 2021-06-28 MED ORDER — ATORVASTATIN CALCIUM 80 MG PO TABS: 80.0000 mg | ORAL_TABLET | Freq: Every day | ORAL | 1 refills | Status: DC

## 2021-06-28 MED ORDER — BUSPIRONE HCL 5 MG PO TABS: 5.0000 mg | ORAL_TABLET | Freq: Two times a day (BID) | ORAL | 0 refills | Status: DC

## 2021-06-28 NOTE — Progress Notes (Signed)
Subjective:  Patient ID: [name], female    DOB: 1951/02/07  Age: 70 y.o. MRN: [id number]  CC: Chief Complaint  Patient presents with   Establish Care    Small nodule chest - changed since 2 yrs ago     HPI:  70 year old female presents for follow-up and to establish care with me.  Nodule on chest Patient reports that she has a nodular area on her chest wall.  She states that she has had this for approximately 2 years. It is somewhat uncomfortable but otherwise does not bother her very much. She would like me to examine it today.  It has not changed in size or color.  Hypertension Patient's blood pressure is currently stable/well-controlled.  She is on no pharmacotherapy at this time.  She states that this is improved with weight loss and dietary changes.  Hyperlipidemia Last lipid panel was earlier this year.  LDL was 116.  Remainder of cholesterol panel was unremarkable.  She is currently on atorvastatin 80 mg and tolerating well.  Anxiety Stable.  Needs refill on BuSpar.  GERD Uses Pepcid at night and is doing well.  Patient states that she responds well to this.  She was previously taking a lot of Tums and has used PPI but had better results with Pepcid.  Patient Active Problem List   Diagnosis Date Noted   Anxiety 06/28/2021   Skin cyst 06/28/2021   GERD (gastroesophageal reflux disease) 06/28/2021   Vitamin D deficiency 07/29/2019   History of total knee replacement, bilateral 10/30/2018   History of MRSA infection 04/04/2017   Hypercalcemia 03/02/2017   History of adenomatous polyp of colon 10/01/2014   Seropositive rheumatoid arthritis of multiple sites ([place]) 07/20/2014   Obesity (BMI 30-39.9) 12/19/2007   DDD (degenerative disc disease), lumbar 06/27/2007   Osteopenia of both forearms 06/27/2007   Essential hypertension 04/04/2007   Hyperlipidemia 02/21/2007    Social Hx   Social History   Socioeconomic History   Marital status: Married     Spouse name: Bertha Lokken   Number of children: 4   Years of education: Not on file   Highest education level: Not on file  Occupational History   Occupation: Stay at home  Tobacco Use   Smoking status: Never   Smokeless tobacco: Never  Vaping Use   Vaping Use: Never used  Substance and Sexual Activity   Alcohol use: Yes    Alcohol/week: 0.0 standard drinks    Comment: sometimes; rarely   Drug use: No   Sexual activity: Yes    Birth control/protection: Post-menopausal  Other Topics Concern   Not on file  Social History Narrative   Not on file   Social Determinants of Health   Financial Resource Strain: Low Risk    Difficulty of Paying Living Expenses: Not very hard  Food Insecurity: No Food Insecurity   Worried About Charity fundraiser in the Last Year: Never true   Ran Out of Food in the Last Year: Never true  Transportation Needs: No Transportation Needs   Lack of Transportation (Medical): No   Lack of Transportation (Non-Medical): No  Physical Activity: Insufficiently Active   Days of Exercise per Week: 3 days   Minutes of Exercise per Session: 20 min  Stress: Stress Concern Present   Feeling of Stress : To some extent  Social Connections: Socially Integrated   Frequency of Communication with Friends and Family: Twice a week   Frequency of Social Gatherings with Friends  and Family: Twice a week   Attends Religious Services: More than 4 times per year   Active Member of Clubs or Organizations: Yes   Attends Music therapist: More than 4 times per year   Marital Status: Married    Review of Systems  Constitutional: Negative.   Musculoskeletal:  Positive for arthralgias.  Skin:        Skin lesion/nodule - chest wall.    Objective:  BP 131/79   Pulse 63   Temp (!) 97.2 F (36.2 C)   Ht 5\' 3"  (1.6 m)   Wt 223 lb (101.2 kg)   SpO2 97%   BMI 39.50 kg/m   BP/Weight 06/28/2021 04/18/2021 12/27/3265  Systolic BP 124 580 998  Diastolic BP 79 74 74   Wt. (Lbs) 223 220 225  BMI 39.5 38.97 39.86    Physical Exam Vitals and nursing note reviewed. Exam conducted with a chaperone present.  Constitutional:      General: She is not in acute distress.    Appearance: Normal appearance. She is not ill-appearing.  HENT:     Head: Normocephalic and atraumatic.  Eyes:     General:        Right eye: No discharge.        Left eye: No discharge.     Conjunctiva/sclera: Conjunctivae normal.  Cardiovascular:     Rate and Rhythm: Normal rate and regular rhythm.     Heart sounds: Murmur heard.  Pulmonary:     Effort: Pulmonary effort is normal.     Breath sounds: Normal breath sounds. No wheezing, rhonchi or rales.  Chest:       Comments: Small firm area which is slightly tender to palpation.  No erythema.  No fluctuance. Neurological:     Mental Status: She is alert.  Psychiatric:        Mood and Affect: Mood normal.        Behavior: Behavior normal.    Lab Results  Component Value Date   WBC 4.0 03/07/2021   HGB 12.5 03/07/2021   HCT 40.6 03/07/2021   PLT 178 03/07/2021   GLUCOSE 104 (H) 03/07/2021   CHOL 184 10/25/2020   TRIG 45 10/25/2020   HDL 59 10/25/2020   LDLCALC 116 (H) 10/25/2020   ALT 22 03/07/2021   AST 24 03/07/2021   NA 138 03/07/2021   K 3.8 03/07/2021   CL 104 03/07/2021   CREATININE 0.97 03/07/2021   BUN 20 03/07/2021   CO2 27 03/07/2021   TSH 2.53 03/17/2020   INR 1.75 (H) 03/03/2010   HGBA1C 5.4 02/27/2019     Assessment & Plan:   Problem List Items Addressed This Visit       Cardiovascular and Mediastinum   Essential hypertension    Stable/at goal.  No pharmacotherapy at this time.  We will continue to monitor closely.      Relevant Medications   atorvastatin (LIPITOR) 80 MG tablet     Digestive   GERD (gastroesophageal reflux disease)    Stable.  Continue Pepcid.      Relevant Medications   famotidine (PEPCID) 40 MG tablet     Musculoskeletal and Integument   Skin cyst     Patient's physical exam is consistent with a cyst.  Discussed removal versus watchful waiting.  Patient elected to just watch it at this time.  This is benign.        Other   Hyperlipidemia    Lipid panel today.  Will likely need additional medication (i.e. Zetia) pending results.  Continue Lipitor.      Relevant Medications   atorvastatin (LIPITOR) 80 MG tablet   Other Relevant Orders   Lipid panel   Anxiety    Stable.  BuSpar refilled.      Relevant Medications   busPIRone (BUSPAR) 5 MG tablet    Meds ordered this encounter  Medications   atorvastatin (LIPITOR) 80 MG tablet    Sig: Take 1 tablet (80 mg total) by mouth daily.    Dispense:  90 tablet    Refill:  1   busPIRone (BUSPAR) 5 MG tablet    Sig: Take 1 tablet (5 mg total) by mouth 2 (two) times daily.    Dispense:  180 tablet    Refill:  0    Needs to see new provider for further refills.   famotidine (PEPCID) 40 MG tablet    Sig: Take 1 tablet (40 mg total) by mouth daily as needed.    Dispense:  90 tablet    Refill:  1    Follow-up:  Return 6 months to 1 year.  [place]

## 2021-06-28 NOTE — Assessment & Plan Note (Signed)
Stable.  Continue Pepcid. 

## 2021-06-28 NOTE — Assessment & Plan Note (Signed)
Lipid panel today.  Will likely need additional medication (i.e. Zetia) pending results.  Continue Lipitor.

## 2021-06-28 NOTE — Assessment & Plan Note (Signed)
Stable/at goal.  No pharmacotherapy at this time.  We will continue to monitor closely.

## 2021-06-28 NOTE — Assessment & Plan Note (Signed)
Patient's physical exam is consistent with a cyst.  Discussed removal versus watchful waiting.  Patient elected to just watch it at this time.  This is benign.

## 2021-06-28 NOTE — Assessment & Plan Note (Signed)
Stable.  BuSpar refilled. 

## 2021-06-28 NOTE — Patient Instructions (Signed)
Labs today.  Keep an eye on that area. No need to worry.  I have refilled your medications.  Follow up in 6 months to 1 year.  Take care  Dr. Lacinda Axon

## 2021-06-29 ENCOUNTER — Other Ambulatory Visit: Payer: Self-pay | Admitting: *Deleted

## 2021-06-29 ENCOUNTER — Telehealth: Payer: Self-pay | Admitting: *Deleted

## 2021-06-29 DIAGNOSIS — Z79899 Other long term (current) drug therapy: Secondary | ICD-10-CM

## 2021-06-29 LAB — CMP14+EGFR
ALT: 15 IU/L (ref 0–32)
AST: 22 IU/L (ref 0–40)
Albumin/Globulin Ratio: 2.2 (ref 1.2–2.2)
Albumin: 4.6 g/dL (ref 3.8–4.8)
Alkaline Phosphatase: 83 IU/L (ref 44–121)
BUN/Creatinine Ratio: 20 (ref 12–28)
BUN: 17 mg/dL (ref 8–27)
Bilirubin Total: 0.7 mg/dL (ref 0.0–1.2)
CO2: 24 mmol/L (ref 20–29)
Calcium: 10.2 mg/dL (ref 8.7–10.3)
Chloride: 104 mmol/L (ref 96–106)
Creatinine, Ser: 0.85 mg/dL (ref 0.57–1.00)
Globulin, Total: 2.1 g/dL (ref 1.5–4.5)
Glucose: 94 mg/dL (ref 70–99)
Potassium: 4.5 mmol/L (ref 3.5–5.2)
Sodium: 142 mmol/L (ref 134–144)
Total Protein: 6.7 g/dL (ref 6.0–8.5)
eGFR: 74 mL/min/{1.73_m2} (ref >59)

## 2021-06-29 LAB — LIPID PANEL
Chol/HDL Ratio: 3.3 ratio (ref 0.0–4.4)
Cholesterol, Total: 189 mg/dL (ref 100–199)
HDL: 58 mg/dL (ref >39)
LDL Chol Calc (NIH): 123 mg/dL — ABNORMAL HIGH (ref 0–99)
Triglycerides: 41 mg/dL (ref 0–149)
VLDL Cholesterol Cal: 8 mg/dL (ref 5–40)

## 2021-06-29 MED ORDER — EZETIMIBE 10 MG PO TABS: 10.0000 mg | ORAL_TABLET | Freq: Every day | ORAL | 3 refills | Status: DC

## 2021-06-29 NOTE — Addendum Note (Signed)
Addended by: Dairl Ponder on: 06/29/2021 03:40 PM   Modules accepted: Orders

## 2021-06-29 NOTE — Telephone Encounter (Signed)
Will you let the patient know that per the current recommendations she is due for another Pneumococcal vaccine (23).   Thank you   Dr. Lacinda Axon     Patient notified and verbalized understanding.

## 2021-06-29 NOTE — Progress Notes (Signed)
CMP WNL

## 2021-06-30 DIAGNOSIS — Z79899 Other long term (current) drug therapy: Secondary | ICD-10-CM | POA: Diagnosis not present

## 2021-07-01 LAB — CBC WITH DIFFERENTIAL/PLATELET
Basophils Absolute: 0 10*3/uL (ref 0.0–0.2)
Basos: 1 %
EOS (ABSOLUTE): 0.1 10*3/uL (ref 0.0–0.4)
Eos: 2 %
Hematocrit: 39.3 % (ref 34.0–46.6)
Hemoglobin: 12.6 g/dL (ref 11.1–15.9)
Immature Grans (Abs): 0 10*3/uL (ref 0.0–0.1)
Immature Granulocytes: 0 %
Lymphocytes Absolute: 1.6 10*3/uL (ref 0.7–3.1)
Lymphs: 36 %
MCH: 27.7 pg (ref 26.6–33.0)
MCHC: 32.1 g/dL (ref 31.5–35.7)
MCV: 86 fL (ref 79–97)
Monocytes Absolute: 0.5 10*3/uL (ref 0.1–0.9)
Monocytes: 10 %
Neutrophils Absolute: 2.3 10*3/uL (ref 1.4–7.0)
Neutrophils: 51 %
Platelets: 216 10*3/uL (ref 150–450)
RBC: 4.55 x10E6/uL (ref 3.77–5.28)
RDW: 14.8 % (ref 11.7–15.4)
WBC: 4.5 10*3/uL (ref 3.4–10.8)

## 2021-07-01 NOTE — Progress Notes (Signed)
CBC with diff WNL

## 2021-07-04 ENCOUNTER — Other Ambulatory Visit: Payer: Self-pay

## 2021-07-04 ENCOUNTER — Ambulatory Visit: Payer: Self-pay

## 2021-07-04 ENCOUNTER — Encounter: Payer: Self-pay | Admitting: Physician Assistant

## 2021-07-04 ENCOUNTER — Ambulatory Visit: Payer: PPO | Admitting: Physician Assistant

## 2021-07-04 VITALS — BP 125/82 | HR 62 | Ht 64.0 in | Wt 226.0 lb

## 2021-07-04 DIAGNOSIS — M5136 Other intervertebral disc degeneration, lumbar region: Secondary | ICD-10-CM | POA: Diagnosis not present

## 2021-07-04 DIAGNOSIS — Z96653 Presence of artificial knee joint, bilateral: Secondary | ICD-10-CM | POA: Diagnosis not present

## 2021-07-04 DIAGNOSIS — I10 Essential (primary) hypertension: Secondary | ICD-10-CM

## 2021-07-04 DIAGNOSIS — M79642 Pain in left hand: Secondary | ICD-10-CM | POA: Diagnosis not present

## 2021-07-04 DIAGNOSIS — M25511 Pain in right shoulder: Secondary | ICD-10-CM

## 2021-07-04 DIAGNOSIS — M0579 Rheumatoid arthritis with rheumatoid factor of multiple sites without organ or systems involvement: Secondary | ICD-10-CM | POA: Diagnosis not present

## 2021-07-04 DIAGNOSIS — Z8659 Personal history of other mental and behavioral disorders: Secondary | ICD-10-CM

## 2021-07-04 DIAGNOSIS — G8929 Other chronic pain: Secondary | ICD-10-CM

## 2021-07-04 DIAGNOSIS — M79672 Pain in left foot: Secondary | ICD-10-CM

## 2021-07-04 DIAGNOSIS — M79641 Pain in right hand: Secondary | ICD-10-CM

## 2021-07-04 DIAGNOSIS — M8589 Other specified disorders of bone density and structure, multiple sites: Secondary | ICD-10-CM

## 2021-07-04 DIAGNOSIS — M7061 Trochanteric bursitis, right hip: Secondary | ICD-10-CM

## 2021-07-04 DIAGNOSIS — Z8614 Personal history of Methicillin resistant Staphylococcus aureus infection: Secondary | ICD-10-CM

## 2021-07-04 DIAGNOSIS — Z8639 Personal history of other endocrine, nutritional and metabolic disease: Secondary | ICD-10-CM

## 2021-07-04 DIAGNOSIS — Z86010 Personal history of colonic polyps: Secondary | ICD-10-CM

## 2021-07-04 DIAGNOSIS — M79671 Pain in right foot: Secondary | ICD-10-CM | POA: Diagnosis not present

## 2021-07-04 DIAGNOSIS — Z79899 Other long term (current) drug therapy: Secondary | ICD-10-CM

## 2021-07-04 DIAGNOSIS — M51369 Other intervertebral disc degeneration, lumbar region without mention of lumbar back pain or lower extremity pain: Secondary | ICD-10-CM

## 2021-07-04 DIAGNOSIS — Z8709 Personal history of other diseases of the respiratory system: Secondary | ICD-10-CM

## 2021-07-04 DIAGNOSIS — Z860101 Personal history of adenomatous and serrated colon polyps: Secondary | ICD-10-CM

## 2021-07-04 MED ORDER — LIDOCAINE HCL 1 % IJ SOLN
1.5000 mL | INTRAMUSCULAR | Status: AC | PRN
  Administered 2021-07-04: 1.5 mL

## 2021-07-04 MED ORDER — TRIAMCINOLONE ACETONIDE 40 MG/ML IJ SUSP
40.0000 mg | INTRAMUSCULAR | Status: AC | PRN
  Administered 2021-07-04: 40 mg via INTRA_ARTICULAR

## 2021-07-04 NOTE — Progress Notes (Signed)
Please call the patient to review x-ray results. Feet: severe erosive RA and OA overlap.  No progression since 2018.  Hands: RA and OA overlap.  No interval change since 2018.  Right shoulder: AC joint space narrowing.

## 2021-07-04 NOTE — Patient Instructions (Signed)
Standing Labs We placed an order today for your standing lab work.   Please have your standing labs drawn in February and every 3 months   If possible, please have your labs drawn 2 weeks prior to your appointment so that the provider can discuss your results at your appointment.  Please note that you may see your imaging and lab results in MyChart before we have reviewed them. We may be awaiting multiple results to interpret others before contacting you. Please allow our office up to 72 hours to thoroughly review all of the results before contacting the office for clarification of your results.  We have open lab daily: Monday through Thursday from 1:30-4:30 PM and Friday from 1:30-4:00 PM at the office of Dr. Shaili Deveshwar, [facility].   Please be advised, all patients with office appointments requiring lab work will take precedent over walk-in lab work.  If possible, please come for your lab work on Monday and Friday afternoons, as you may experience shorter wait times. The office is located at [address], [address], [place], [place] [address] No appointment is necessary.   Labs are drawn by [facility]. Please bring your co-pay at the time of your lab draw.  You may receive a bill from [facility] for your lab work.  If you wish to have your labs drawn at another location, please call the office 24 hours in advance to send orders.  If you have any questions regarding directions or hours of operation,  please call [phone].   As a reminder, please drink plenty of water prior to coming for your lab work. Thanks!  

## 2021-07-11 ENCOUNTER — Ambulatory Visit (INDEPENDENT_AMBULATORY_CARE_PROVIDER_SITE_OTHER): Payer: PPO | Admitting: Family Medicine

## 2021-07-11 ENCOUNTER — Other Ambulatory Visit: Payer: Self-pay

## 2021-07-11 ENCOUNTER — Encounter (INDEPENDENT_AMBULATORY_CARE_PROVIDER_SITE_OTHER): Payer: Self-pay | Admitting: Family Medicine

## 2021-07-11 VITALS — BP 143/79 | HR 65 | Temp 97.8°F | Ht 63.0 in | Wt 217.0 lb

## 2021-07-11 DIAGNOSIS — Z6841 Body Mass Index (BMI) 40.0 and over, adult: Secondary | ICD-10-CM | POA: Diagnosis not present

## 2021-07-11 DIAGNOSIS — E785 Hyperlipidemia, unspecified: Secondary | ICD-10-CM | POA: Diagnosis not present

## 2021-07-11 NOTE — Progress Notes (Signed)
Chief Complaint:   OBESITY [name] is here to discuss her progress with her obesity treatment plan along with follow-up of her obesity related diagnoses. [name] is on keeping a food journal and adhering to recommended goals of 1450-1500 calories and 90+ grams protein and states she is following her eating plan approximately 80% of the time. [name] states she is doing exercise class 60 minutes 2 times per week.  Today's visit was #: 6 Starting weight: 267 lbs Starting date: 10/31/2018 Today's weight: 217 lbs Today's date: 07/11/2021 Total lbs lost to date: 50 Total lbs lost since last in-office visit: 3  Interim History: [name] is experimenting more with plant based options and really enjoying it. She is eating more vegetarian type options since reading [employer]. She recognizes that sometimes that makes it difficult to get all protein in so then she does more animal protein. Pt averages around 1300 calories daily.  Subjective:   1. Hyperlipidemia, unspecified hyperlipidemia type [name] is on Lipitor and Zetia. Some muscle aches occasionally noted. Her last fasting lipid panel showed LDL 123, HDL 58, and triglycerides 41.  Assessment/Plan:   1. Hyperlipidemia, unspecified hyperlipidemia type Cardiovascular risk and specific lipid/LDL goals reviewed.  We discussed several lifestyle modifications today and [name] will continue to work on diet, exercise and weight loss efforts. Orders and follow up as documented in patient record.   Counseling Intensive lifestyle modifications are the first line treatment for this issue. Dietary changes: Increase soluble fiber. Decrease simple carbohydrates. Exercise changes: Moderate to vigorous-intensity aerobic activity 150 minutes per week if tolerated. Lipid-lowering medications: see documented in medical record.  2. Obesity with current BMI of 38.5  [name] is currently in the action stage of change. As such, her goal is to continue  with weight loss efforts. She has agreed to keeping a food journal and adhering to recommended goals of 1450-1500 calories and 90+ grams protein.   Exercise goals:  As is  Behavioral modification strategies: increasing lean protein intake, meal planning and cooking strategies, keeping healthy foods in the home, travel eating strategies, holiday eating strategies , and keeping a strict food journal.  [name] has agreed to follow-up with our clinic in 12 weeks. She was informed of the importance of frequent follow-up visits to maximize her success with intensive lifestyle modifications for her multiple health conditions.   Objective:   Blood pressure (!) 143/79, pulse 65, temperature 97.8 F (36.6 C), height 5\' 3"  (1.6 m), weight 217 lb (98.4 kg), SpO2 100 %. Body mass index is 38.44 kg/m.  General: Cooperative, alert, well developed, in no acute distress. HEENT: Conjunctivae and lids unremarkable. Cardiovascular: Regular rhythm.  Lungs: Normal work of breathing. Neurologic: No focal deficits.   Lab Results  Component Value Date   CREATININE 0.85 06/28/2021   BUN 17 06/28/2021   NA 142 06/28/2021   K 4.5 06/28/2021   CL 104 06/28/2021   CO2 24 06/28/2021   Lab Results  Component Value Date   ALT 15 06/28/2021   AST 22 06/28/2021   ALKPHOS 83 06/28/2021   BILITOT 0.7 06/28/2021   Lab Results  Component Value Date   HGBA1C 5.4 02/27/2019   HGBA1C 5.5 10/31/2018   Lab Results  Component Value Date   INSULIN 8.3 02/27/2019   INSULIN 12.3 10/31/2018   Lab Results  Component Value Date   TSH 2.53 03/17/2020   Lab Results  Component Value Date   CHOL 189 06/28/2021   HDL 58 06/28/2021  LDLCALC 123 (H) 06/28/2021   TRIG 41 06/28/2021   CHOLHDL 3.3 06/28/2021   Lab Results  Component Value Date   VD25OH 32.7 09/29/2020   VD25OH 49.1 03/09/2020   VD25OH 41.6 02/27/2019   Lab Results  Component Value Date   WBC 4.5 06/30/2021   HGB 12.6 06/30/2021   HCT 39.3  06/30/2021   MCV 86 06/30/2021   PLT 216 06/30/2021    Attestation Statements:   Reviewed by clinician on day of visit: allergies, medications, problem list, medical history, surgical history, family history, social history, and previous encounter notes.  Coral Ceo, CMA, am acting as transcriptionist for Coralie Common, MD.  I have reviewed the above documentation for accuracy and completeness, and I agree with the above. - Coralie Common, MD

## 2021-07-19 ENCOUNTER — Telehealth: Payer: Self-pay | Admitting: Pharmacist

## 2021-07-19 NOTE — Telephone Encounter (Signed)
Patient faxed [employer] PAP renewal application for Enbrel.  Awaiting patient portion - placed in provider's folder to be signed with signed patient forms, med list, and insurance card copy  Knox Saliva, PharmD, MPH, BCPS Clinical Pharmacist (Rheumatology and Pulmonology)

## 2021-07-20 NOTE — Telephone Encounter (Signed)
Submitted Patient Assistance RENEWAL Application to [employer] for ENBREL along with provider portion, patient portion, med list, insurance card copy. Will update patient when we receive a response.  Fax# [phone] Phone# [phone]  Knox Saliva, PharmD, MPH, BCPS Clinical Pharmacist (Rheumatology and Pulmonology)

## 2021-07-28 NOTE — Telephone Encounter (Signed)
Called [employer] for f/u on Enbrel PAP renewal application. Per rep, they do not have application.  REFAXED Patient Assistance RENEWAL Application to [employer] for ENBREL along with provider portion, patient portion, PA, insurance card copy, and med list. Will update patient when we receive a response.   Fax# [phone] Phone# [phone]   Knox Saliva, PharmD, MPH, BCPS Clinical Pharmacist (Rheumatology and Pulmonology)

## 2021-08-04 NOTE — Telephone Encounter (Signed)
Received verbal confirmation from  [place] regarding an approval for ENBREL patient assistance from 08/03/21 to 08/20/22.  Clinic received a fax that states that she is approved through 08/20/21  Phone number: [phone]  Knox Saliva, PharmD, MPH, BCPS Clinical Pharmacist (Rheumatology and Pulmonology)

## 2021-08-23 ENCOUNTER — Other Ambulatory Visit (HOSPITAL_COMMUNITY): Payer: Self-pay | Admitting: Family Medicine

## 2021-08-23 DIAGNOSIS — Z1231 Encounter for screening mammogram for malignant neoplasm of breast: Secondary | ICD-10-CM

## 2021-08-29 ENCOUNTER — Other Ambulatory Visit: Payer: Self-pay

## 2021-08-29 ENCOUNTER — Ambulatory Visit (HOSPITAL_COMMUNITY)
Admission: RE | Admit: 2021-08-29 | Discharge: 2021-08-29 | Disposition: A | Discharge status: Home / Self Care | Payer: PPO | Source: Ambulatory Visit | Attending: Family Medicine | Admitting: Family Medicine

## 2021-08-29 DIAGNOSIS — Z1231 Encounter for screening mammogram for malignant neoplasm of breast: Secondary | ICD-10-CM | POA: Diagnosis not present

## 2021-10-03 ENCOUNTER — Other Ambulatory Visit: Payer: Self-pay

## 2021-10-03 ENCOUNTER — Ambulatory Visit (INDEPENDENT_AMBULATORY_CARE_PROVIDER_SITE_OTHER): Payer: PPO | Admitting: Family Medicine

## 2021-10-03 ENCOUNTER — Encounter (INDEPENDENT_AMBULATORY_CARE_PROVIDER_SITE_OTHER): Payer: Self-pay | Admitting: Family Medicine

## 2021-10-03 VITALS — BP 156/83 | HR 69 | Temp 97.9°F | Ht 63.0 in | Wt 225.0 lb

## 2021-10-03 DIAGNOSIS — Z6839 Body mass index (BMI) 39.0-39.9, adult: Secondary | ICD-10-CM

## 2021-10-03 DIAGNOSIS — E559 Vitamin D deficiency, unspecified: Secondary | ICD-10-CM

## 2021-10-03 DIAGNOSIS — E669 Obesity, unspecified: Secondary | ICD-10-CM | POA: Diagnosis not present

## 2021-10-03 DIAGNOSIS — R030 Elevated blood-pressure reading, without diagnosis of hypertension: Secondary | ICD-10-CM | POA: Diagnosis not present

## 2021-10-03 DIAGNOSIS — Z6841 Body Mass Index (BMI) 40.0 and over, adult: Secondary | ICD-10-CM

## 2021-10-03 DIAGNOSIS — E7849 Other hyperlipidemia: Secondary | ICD-10-CM | POA: Diagnosis not present

## 2021-10-04 NOTE — Progress Notes (Signed)
Chief Complaint:   OBESITY [name] is here to discuss her progress with her obesity treatment plan along with follow-up of her obesity related diagnoses. [name] is on keeping a food journal and adhering to recommended goals of 1400-1500 calories and 90+ grams protein and states she is following her eating plan approximately 100% of the time. [name] states she is walking and doing exercise classes 45-60 minutes 1-2 times per week.  Today's visit was #: 25 Starting weight: 267 lbs Starting date: 10/31/2018 Today's weight: 225 lbs Today's date: 10/03/2021 Total lbs lost to date: 42 Total lbs lost since last in-office visit: 0  Interim History: Pt tried something new diet wise. Secondary to constipation, she went more plant based but doesn't like the faux meat products. She did more vegetable and grain based foods. Pt does feel better eating this way. She is still cooking protein but not eating as much. She is incorporating some protein shakes but not necessarily daily.  Subjective:   1. Other hyperlipidemia [name] is on Lipitor without transaminitis.  2. Vitamin D deficiency [name] denies nausea, vomiting, and muscle weakness but notes fatigue. She is on OTC Vit D 5K IU daily.  3. Elevated blood pressure reading BP elevated today. [name] denies chest pain/chest pressure/headache.  Assessment/Plan:   1. Other hyperlipidemia Cardiovascular risk and specific lipid/LDL goals reviewed.  We discussed several lifestyle modifications today and [name] will continue to work on diet, exercise and weight loss efforts. Orders and follow up as documented in patient record. Continue Lipitor with no change in dose. F/u labs in April/May.  Counseling Intensive lifestyle modifications are the first line treatment for this issue. Dietary changes: Increase soluble fiber. Decrease simple carbohydrates. Exercise changes: Moderate to vigorous-intensity aerobic activity 150 minutes per week if  tolerated. Lipid-lowering medications: see documented in medical record.  2. Vitamin D deficiency Low Vitamin D level contributes to fatigue and are associated with obesity, breast, and colon cancer. She agrees to continue to take OTC Vitamin D 5,000 IU daily and will follow-up for routine testing of Vitamin D, at least 2-3 times per year to avoid over-replacement.  3. Elevated blood pressure reading F/u on BP at next appt.  4. Obesity with current BMI of 39.9 [name] is currently in the action stage of change. As such, her goal is to continue with weight loss efforts. She has agreed to keeping a food journal and adhering to recommended goals of 1350-1450 calories and 85+ grams protein.   Exercise goals:  As is  Behavioral modification strategies: increasing lean protein intake, meal planning and cooking strategies, keeping healthy foods in the home, and planning for success.  [name] has agreed to follow-up with our clinic in 10-12 weeks. She was informed of the importance of frequent follow-up visits to maximize her success with intensive lifestyle modifications for her multiple health conditions.   Objective:   Blood pressure (!) 156/83, pulse 69, temperature 97.9 F (36.6 C), height 5\' 3"  (1.6 m), weight 225 lb (102.1 kg), SpO2 99 %. Body mass index is 39.86 kg/m.  General: Cooperative, alert, well developed, in no acute distress. HEENT: Conjunctivae and lids unremarkable. Cardiovascular: Regular rhythm.  Lungs: Normal work of breathing. Neurologic: No focal deficits.   Lab Results  Component Value Date   CREATININE 0.85 06/28/2021   BUN 17 06/28/2021   NA 142 06/28/2021   K 4.5 06/28/2021   CL 104 06/28/2021   CO2 24 06/28/2021   Lab Results  Component Value Date  ALT 15 06/28/2021   AST 22 06/28/2021   ALKPHOS 83 06/28/2021   BILITOT 0.7 06/28/2021   Lab Results  Component Value Date   HGBA1C 5.4 02/27/2019   HGBA1C 5.5 10/31/2018   Lab Results  Component  Value Date   INSULIN 8.3 02/27/2019   INSULIN 12.3 10/31/2018   Lab Results  Component Value Date   TSH 2.53 03/17/2020   Lab Results  Component Value Date   CHOL 189 06/28/2021   HDL 58 06/28/2021   LDLCALC 123 (H) 06/28/2021   TRIG 41 06/28/2021   CHOLHDL 3.3 06/28/2021   Lab Results  Component Value Date   VD25OH 32.7 09/29/2020   VD25OH 49.1 03/09/2020   VD25OH 41.6 02/27/2019   Lab Results  Component Value Date   WBC 4.5 06/30/2021   HGB 12.6 06/30/2021   HCT 39.3 06/30/2021   MCV 86 06/30/2021   PLT 216 06/30/2021    Attestation Statements:   Reviewed by clinician on day of visit: allergies, medications, problem list, medical history, surgical history, family history, social history, and previous encounter notes.  Coral Ceo, CMA, am acting as transcriptionist for Coralie Common, MD.  I have reviewed the above documentation for accuracy and completeness, and I agree with the above. - Coralie Common, MD

## 2021-10-18 DIAGNOSIS — S92253A Displaced fracture of navicular [scaphoid] of unspecified foot, initial encounter for closed fracture: Secondary | ICD-10-CM | POA: Diagnosis not present

## 2021-10-18 DIAGNOSIS — M79671 Pain in right foot: Secondary | ICD-10-CM | POA: Diagnosis not present

## 2021-10-30 ENCOUNTER — Encounter: Payer: Self-pay | Admitting: Family Medicine

## 2021-10-31 ENCOUNTER — Other Ambulatory Visit: Payer: Self-pay | Admitting: Family Medicine

## 2021-10-31 DIAGNOSIS — E785 Hyperlipidemia, unspecified: Secondary | ICD-10-CM

## 2021-11-01 DIAGNOSIS — M79671 Pain in right foot: Secondary | ICD-10-CM | POA: Diagnosis not present

## 2021-11-01 DIAGNOSIS — S92251D Displaced fracture of navicular [scaphoid] of right foot, subsequent encounter for fracture with routine healing: Secondary | ICD-10-CM | POA: Diagnosis not present

## 2021-11-08 ENCOUNTER — Other Ambulatory Visit: Payer: Self-pay

## 2021-11-08 ENCOUNTER — Ambulatory Visit (INDEPENDENT_AMBULATORY_CARE_PROVIDER_SITE_OTHER): Payer: PPO

## 2021-11-08 VITALS — Ht 63.0 in | Wt 225.0 lb

## 2021-11-08 DIAGNOSIS — Z780 Asymptomatic menopausal state: Secondary | ICD-10-CM

## 2021-11-08 DIAGNOSIS — Z0000 Encounter for general adult medical examination without abnormal findings: Secondary | ICD-10-CM | POA: Diagnosis not present

## 2021-11-08 NOTE — Patient Instructions (Signed)
[name] , ?Thank you for taking time to come for your Medicare Wellness Visit. I appreciate your ongoing commitment to your health goals. Please review the following plan we discussed and let me know if I can assist you in the future.  ? ?Screening recommendations/referrals: ?Colonoscopy: Done 03/05/2020 Repeat in 10 years ? ?Mammogram: Done 08/29/2021 Repeat annually ? ?Bone Density: Done 01/12/2020. Order placed today to schedule. ? ?Recommended yearly ophthalmology/optometry visit for glaucoma screening and checkup ?Recommended yearly dental visit for hygiene and checkup ? ?Vaccinations: ?Influenza vaccine: Done 05/14/2021 Repeat annually ? ?Pneumococcal vaccine: Done 06/27/2007 and 02/18/1993. Booster at next visit. ?Tdap vaccine: Due. Repeat in 10 years ? ?Shingles vaccine: Discussed second dose. First dose done 08/19/2018.   ?Covid-19:Done 05/23/21, 06/03/20, 11/04/19 and 10/12/19. ? ?Advanced directives: Please bring a copy of your health care power of attorney and living will to the office to be added to your chart at your convenience. ? ? ?Conditions/risks identified: KEEP UP THE GOOD WORK!! ? ?Next appointment: Follow up in one year for your annual wellness visit 2024. ? ? ?Preventive Care 110 Years and Older, Female ?Preventive care refers to lifestyle choices and visits with your health care provider that can promote health and wellness. ?What does preventive care include? ?A yearly physical exam. This is also called an annual well check. ?Dental exams once or twice a year. ?Routine eye exams. Ask your health care provider how often you should have your eyes checked. ?Personal lifestyle choices, including: ?Daily care of your teeth and gums. ?Regular physical activity. ?Eating a healthy diet. ?Avoiding tobacco and drug use. ?Limiting alcohol use. ?Practicing safe sex. ?Taking low-dose aspirin every day. ?Taking vitamin and mineral supplements as recommended by your health care provider. ?What happens during an  annual well check? ?The services and screenings done by your health care provider during your annual well check will depend on your age, overall health, lifestyle risk factors, and family history of disease. ?Counseling  ?Your health care provider may ask you questions about your: ?Alcohol use. ?Tobacco use. ?Drug use. ?Emotional well-being. ?Home and relationship well-being. ?Sexual activity. ?Eating habits. ?History of falls. ?Memory and ability to understand (cognition). ?Work and work Statistician. ?Reproductive health. ?Screening  ?You may have the following tests or measurements: ?Height, weight, and BMI. ?Blood pressure. ?Lipid and cholesterol levels. These may be checked every 5 years, or more frequently if you are over 17 years old. ?Skin check. ?Lung cancer screening. You may have this screening every year starting at age 79 if you have a 30-pack-year history of smoking and currently smoke or have quit within the past 15 years. ?Fecal occult blood test (FOBT) of the stool. You may have this test every year starting at age 63. ?Flexible sigmoidoscopy or colonoscopy. You may have a sigmoidoscopy every 5 years or a colonoscopy every 10 years starting at age 27. ?Hepatitis C blood test. ?Hepatitis B blood test. ?Sexually transmitted disease (STD) testing. ?Diabetes screening. This is done by checking your blood sugar (glucose) after you have not eaten for a while (fasting). You may have this done every 1-3 years. ?Bone density scan. This is done to screen for osteoporosis. You may have this done starting at age 47. ?Mammogram. This may be done every 1-2 years. Talk to your health care provider about how often you should have regular mammograms. ?Talk with your health care provider about your test results, treatment options, and if necessary, the need for more tests. ?Vaccines  ?Your health care provider  may recommend certain vaccines, such as: ?Influenza vaccine. This is recommended every year. ?Tetanus,  diphtheria, and acellular pertussis (Tdap, Td) vaccine. You may need a Td booster every 10 years. ?Zoster vaccine. You may need this after age 66. ?Pneumococcal 13-valent conjugate (PCV13) vaccine. One dose is recommended after age 95. ?Pneumococcal polysaccharide (PPSV23) vaccine. One dose is recommended after age 6. ?Talk to your health care provider about which screenings and vaccines you need and how often you need them. ?This information is not intended to replace advice given to you by your health care provider. Make sure you discuss any questions you have with your health care provider. ?Document Released: 09/03/2015 Document Revised: 04/26/2016 Document Reviewed: 06/08/2015 ?Elsevier Interactive Patient Education ? 2017 [place]. ? ?Fall Prevention in the Home ?Falls can cause injuries. They can happen to people of all ages. There are many things you can do to make your home safe and to help prevent falls. ?What can I do on the outside of my home? ?Regularly fix the edges of walkways and driveways and fix any cracks. ?Remove anything that might make you trip as you walk through a door, such as a raised step or threshold. ?Trim any bushes or trees on the path to your home. ?Use bright outdoor lighting. ?Clear any walking paths of anything that might make someone trip, such as rocks or tools. ?Regularly check to see if handrails are loose or broken. Make sure that both sides of any steps have handrails. ?Any raised decks and porches should have guardrails on the edges. ?Have any leaves, snow, or ice cleared regularly. ?Use sand or salt on walking paths during winter. ?Clean up any spills in your garage right away. This includes oil or grease spills. ?What can I do in the bathroom? ?Use night lights. ?Install grab bars by the toilet and in the tub and shower. Do not use towel bars as grab bars. ?Use non-skid mats or decals in the tub or shower. ?If you need to sit down in the shower, use a plastic,  non-slip stool. ?Keep the floor dry. Clean up any water that spills on the floor as soon as it happens. ?Remove soap buildup in the tub or shower regularly. ?Attach bath mats securely with double-sided non-slip rug tape. ?Do not have throw rugs and other things on the floor that can make you trip. ?What can I do in the bedroom? ?Use night lights. ?Make sure that you have a light by your bed that is easy to reach. ?Do not use any sheets or blankets that are too big for your bed. They should not hang down onto the floor. ?Have a firm chair that has side arms. You can use this for support while you get dressed. ?Do not have throw rugs and other things on the floor that can make you trip. ?What can I do in the kitchen? ?Clean up any spills right away. ?Avoid walking on wet floors. ?Keep items that you use a lot in easy-to-reach places. ?If you need to reach something above you, use a strong step stool that has a grab bar. ?Keep electrical cords out of the way. ?Do not use floor polish or wax that makes floors slippery. If you must use wax, use non-skid floor wax. ?Do not have throw rugs and other things on the floor that can make you trip. ?What can I do with my stairs? ?Do not leave any items on the stairs. ?Make sure that there are handrails on both sides  of the stairs and use them. Fix handrails that are broken or loose. Make sure that handrails are as long as the stairways. ?Check any carpeting to make sure that it is firmly attached to the stairs. Fix any carpet that is loose or worn. ?Avoid having throw rugs at the top or bottom of the stairs. If you do have throw rugs, attach them to the floor with carpet tape. ?Make sure that you have a light switch at the top of the stairs and the bottom of the stairs. If you do not have them, ask someone to add them for you. ?What else can I do to help prevent falls? ?Wear shoes that: ?Do not have high heels. ?Have rubber bottoms. ?Are comfortable and fit you well. ?Are closed  at the toe. Do not wear sandals. ?If you use a stepladder: ?Make sure that it is fully opened. Do not climb a closed stepladder. ?Make sure that both sides of the stepladder are locked into place. ?Ask someone to [facility]

## 2021-11-08 NOTE — Progress Notes (Signed)
? ?Subjective:  ? [name] is a 71 y.o. female who presents for an Initial Medicare Annual Wellness Visit. ?Virtual Visit via Telephone Note ? ?I connected with  [name] on 11/08/21 at  8:20 AM EDT by telephone and verified that I am speaking with the correct person using two identifiers. ? ?Location: ?Patient: HOME ?Provider: [facility] ?Persons participating in the virtual visit: patient/Nurse Health Advisor ?  ?I discussed the limitations, risks, security and privacy concerns of performing an evaluation and management service by telephone and the availability of in person appointments. The patient expressed understanding and agreed to proceed. ? ?Interactive audio and video telecommunications were attempted between this nurse and patient, however failed, due to patient having technical difficulties OR patient did not have access to video capability.  We continued and completed visit with audio only. ? ?Some vital signs may be absent or patient reported.  ? ?Chriss Driver, LPN ? ?Review of Systems    ? ?Cardiac Risk Factors include: advanced age (>70mn, >>27women);hypertension;dyslipidemia;sedentary lifestyle;obesity (BMI >30kg/m2) ? ?   ?Objective:  ?  ?Today's Vitals  ? 11/08/21 0818 11/08/21 0819  ?Weight: 225 lb (102.1 kg)   ?Height: '5\' 3"'$  (1.6 m)   ?PainSc:  3   ? ?Body mass index is 39.86 kg/m?. ? ?Advanced Directives 11/08/2021 03/07/2021 03/05/2020 01/03/2016 10/12/2014  ?Does Patient Have a Medical Advance Directive? Yes Yes Yes Yes Yes  ?Type of AParamedicof [place] will Living will Living will [place] will [place] will  ?Copy of [place] Chart? No - copy requested - - No - copy requested -  ? ? ?Current Medications (verified) ?Outpatient Encounter Medications as of 11/08/2021  ?Medication Sig  ? acetaminophen (TYLENOL) 500 MG tablet Take 500-1,000 mg by mouth every 6 (six) hours as  needed (pain).   ? atorvastatin (LIPITOR) 80 MG tablet Take 1 tablet (80 mg total) by mouth daily.  ? B Complex Vitamins (VITAMIN B COMPLEX) TABS Take 1 tablet by mouth at bedtime.   ? busPIRone (BUSPAR) 5 MG tablet Take 1 tablet (5 mg total) by mouth 2 (two) times daily.  ? chlorzoxazone (PARAFON) 500 MG tablet Take 500 mg by mouth daily as needed for muscle spasms (typically with travel only).  ? Cholecalciferol (VITAMIN D) 125 MCG (5000 UT) CAPS Take 5,000 Units by mouth daily after breakfast.  ? Etanercept (ENBREL MINI) 50 MG/ML SOCT Inject 50 mg into the skin once a week.  ? ezetimibe (ZETIA) 10 MG tablet Take 1 tablet (10 mg total) by mouth daily.  ? famotidine (PEPCID) 40 MG tablet Take 1 tablet (40 mg total) by mouth daily as needed.  ? Magnesium 500 MG CAPS Take 500 mg by mouth.  ? Probiotic Product (ALIGN PO) Take 1 capsule by mouth daily.   ? Propylene Glycol (SYSTANE BALANCE OP) Place 1 drop into both eyes 4 (four) times daily as needed (dry/irritated eyes.).   ? triamcinolone cream (KENALOG) 0.1 % Apply 1 application topically 2 (two) times daily. Prn rash; use up to 2 weeks  ? ?Facility-Administered Encounter Medications as of 11/08/2021  ?Medication  ? methylPREDNISolone acetate (DEPO-MEDROL) injection 40 mg  ? methylPREDNISolone acetate (DEPO-MEDROL) injection 40 mg  ? ? ?Allergies (verified) ?Codeine, Cyclosporine, and Propylene glycol  ? ?History: ?Past Medical History:  ?Diagnosis Date  ? Allergic rhinitis   ? Allergy   ? Seasonal. Fall  ? Anxiety   ?  Arthritis   ? Asthma   ? Constipation   ? Depression   ? Gallbladder problem   ? Hematuria 08/25/2015  ? Hyperlipidemia   ? Hypertension   ? Impetigo   ? in nose per patient, dx by derm.  ? Obesity   ? Osteoarthritis   ? Pelvic pain in female 08/25/2015  ? Rheumatoid arthritis ([place])   ? Swallowing difficulty   ? Swelling   ? ?Past Surgical History:  ?Procedure Laterality Date  ? BACK SURGERY    ? CHOLECYSTECTOMY    ? COLONOSCOPY  2007  ? sessile  sigmoid polyp x 1; path: serrated adenoma  ? COLONOSCOPY N/A 10/12/2014  ? Procedure: COLONOSCOPY;  Surgeon: [name], MD;  [facility];  Service: Endoscopy;  Laterality: N/A;  ? COLONOSCOPY N/A 03/05/2020  ? Procedure: COLONOSCOPY;  Surgeon: Daneil Dolin, MD;  [facility];  Service: Endoscopy;  Laterality: N/A;  9:30  ? EYE SURGERY  2019  ? Cataract  ? JOINT REPLACEMENT    ? BIL knee   ? KNEE ARTHROPLASTY    ? KNEE SURGERY    ? bilateral knee replacement  ? LIPOMA EXCISION    ? x 2  ? [place] SURGERY  2008  ? Rod L4-L5  ? TONSILLECTOMY AND ADENOIDECTOMY    ? ?Family History  ?Problem Relation Age of Onset  ? Heart attack Father   ? Alcoholism Father   ? Depression Father   ? Heart disease Father   ? Hyperlipidemia Father   ? Hypertension Father   ? Leukemia Mother   ? Hypertension Mother   ? Cancer Mother   ? Depression Mother   ? Early death Mother   ? Miscarriages / [place] Mother   ? Obesity Sister   ? Other Sister   ?     breathing problems  ? Hepatitis C Sister   ? Arthritis Sister   ? Depression Sister   ? Colon cancer Sister   ? Squamous cell carcinoma Sister   ? Emphysema Brother   ? Obesity Daughter   ? Polycystic ovary syndrome Daughter   ? Depression Daughter   ? Obesity Son   ? Other Son   ?     knee problems  ? Cancer Maternal Grandmother   ?     breast  ? Other Brother   ?     aneursym  ? Arthritis Sister   ? Depression Sister   ? Squamous cell carcinoma Sister   ? Obesity Son   ? Asthma Son   ? Depression Son   ? Obesity Son   ? Diabetes Other   ?     runs on dad's side of the family  ? Arthritis Sister   ? Cancer Sister   ? Depression Sister   ? Obesity Sister   ? Cancer Sister   ? Obesity Sister   ? Depression Son   ? Obesity Son   ? Depression Son   ? Obesity Son   ? Vision loss Maternal Aunt   ? ?Social History  ? ?Socioeconomic History  ? Marital status: Married  ?  Spouse name: Derian Pfost  ? Number of children: 4  ? Years of education: Not on file  ? Highest  education level: Not on file  ?Occupational History  ? Occupation: Stay at home  ?Tobacco Use  ? Smoking status: Never  ? Smokeless tobacco: Never  ?Vaping Use  ? Vaping Use: Never  used  ?Substance and Sexual Activity  ? Alcohol use: Yes  ?  Comment: sometimes; rarely  ? Drug use: No  ? Sexual activity: Yes  ?  Birth control/protection: Post-menopausal  ?Other Topics Concern  ? Not on file  ?Social History Narrative  ? Not on file  ? ?Social Determinants of Health  ? ?Financial Resource Strain: Low Risk   ? Difficulty of Paying Living Expenses: Not very hard  ?Food Insecurity: No Food Insecurity  ? Worried About Charity fundraiser in the Last Year: Never true  ? Ran Out of Food in the Last Year: Never true  ?Transportation Needs: No Transportation Needs  ? Lack of Transportation (Medical): No  ? Lack of Transportation (Non-Medical): No  ?Physical Activity: Sufficiently Active  ? Days of Exercise per Week: 3 days  ? Minutes of Exercise per Session: 50 min  ?Stress: Stress Concern Present  ? Feeling of Stress : To some extent  ?Social Connections: Socially Integrated  ? Frequency of Communication with Friends and Family: More than three times a week  ? Frequency of Social Gatherings with Friends and Family: More than three times a week  ? Attends Religious Services: More than 4 times per year  ? Active Member of Clubs or Organizations: Yes  ? Attends Archivist Meetings: More than 4 times per year  ? Marital Status: Married  ? ? ?Tobacco Counseling ?Counseling given: Not Answered ? ? ?Clinical Intake: ? ?Pre-visit preparation completed: Yes ? ?Pain : 0-10 ?Pain Score: 3  ?Pain Type: Chronic pain ?Pain Location: Hand ?Pain Descriptors / Indicators: Aching, Dull ?Pain Onset: More than a month ago ?Pain Frequency: Intermittent ? ?  ? ?BMI - recorded: 39.86 ?Nutritional Status: BMI > 30  Obese ?Nutritional Risks: None ?Diabetes: No ? ?How often do you need to have someone help you when you read instructions,  pamphlets, or other written materials from your doctor or pharmacy?: 1 - Never ? ?Diabetic?No ? ?Interpreter Needed?: No ? ?Information entered by :: mj Lariah Fleer,lpn ? ? ?Activities of Daily Living ?In your pre

## 2021-11-09 NOTE — Progress Notes (Signed)
? ?Office Visit Note ? ?Patient: [name]             ?Date of Birth: [birth year]           ?MRN: [id number]             ?PCP: Coral Spikes, DO ?Referring: Coral Spikes, DO ?Visit Date: 11/23/2021 ?Occupation: '@GUAROCC'$ @ ? ?Subjective:  ?Pain in left shoulder and left knee ? ?History of Present Illness: [name] is a 71 y.o. female striae of rheumatoid arthritis and osteoarthritis.  She states about 5 weeks ago she injured her right foot during an exercise class.  She was seen and evaluated by her podiatrist, according to the patient who found a chip fracture in her metatarsal.  She was in a boot for about 4 weeks and then she has been wearing lace up shoes.  She states about a week ago she fell and landed on her left knee which has been replaced.  Since then she has been having pain and discomfort in her left knee.  She has history of chronic bilateral shoulder pain.  Her left shoulder pain has increased since her fall.  She notices fluid retention in her left ankle.  She has been taking Enbrel on a weekly basis and has been tolerating it well. ? ?Activities of Daily Living:  ?Patient reports morning stiffness for 1 hour.   ?Patient Reports nocturnal pain.  ?Difficulty dressing/grooming: Denies ?Difficulty climbing stairs: Denies ?Difficulty getting out of chair: Denies ?Difficulty using hands for taps, buttons, cutlery, and/or writing: Reports ? ?Review of Systems  ?Constitutional:  Positive for fatigue.  ?HENT:  Positive for mouth dryness and nose dryness. Negative for mouth sores.   ?Eyes:  Positive for pain, itching and dryness.  ?Respiratory:  Negative for difficulty breathing.   ?Cardiovascular:  Negative for chest pain and palpitations.  ?Gastrointestinal:  Negative for blood in stool, constipation and diarrhea.  ?Endocrine: Negative for increased urination.  ?Genitourinary:  Negative for difficulty urinating.  ?Musculoskeletal:  Positive for joint pain, joint pain, joint swelling, myalgias,  morning stiffness, muscle tenderness and myalgias.  ?Skin:  Negative for color change, rash and redness.  ?Allergic/Immunologic: Positive for susceptible to infections.  ?Neurological:  Positive for dizziness, headaches, parasthesias and weakness. Negative for numbness and memory loss.  ?Hematological:  Positive for bruising/bleeding tendency.  ?Psychiatric/Behavioral:  Negative for confusion.   ? ?PMFS History:  ?Patient Active Problem List  ? Diagnosis Date Noted  ? Anxiety 06/28/2021  ? Skin cyst 06/28/2021  ? GERD (gastroesophageal reflux disease) 06/28/2021  ? Vitamin D deficiency 07/29/2019  ? History of total knee replacement, bilateral 10/30/2018  ? History of MRSA infection 04/04/2017  ? Hypercalcemia 03/02/2017  ? History of adenomatous polyp of colon 10/01/2014  ? Seropositive rheumatoid arthritis of multiple sites ([place]) 07/20/2014  ? Obesity (BMI 30-39.9) 12/19/2007  ? DDD (degenerative disc disease), lumbar 06/27/2007  ? Osteopenia of both forearms 06/27/2007  ? Essential hypertension 04/04/2007  ? Hyperlipidemia 02/21/2007  ?  ?Past Medical History:  ?Diagnosis Date  ? Allergic rhinitis   ? Allergy   ? Seasonal. Fall  ? Anxiety   ? Arthritis   ? Asthma   ? Constipation   ? Depression   ? Gallbladder problem   ? Hematuria 08/25/2015  ? Hyperlipidemia   ? Hypertension   ? Impetigo   ? in nose per patient, dx by derm.  ? Obesity   ? Osteoarthritis   ? Pelvic pain in  female 08/25/2015  ? Rheumatoid arthritis ([place])   ? Swallowing difficulty   ? Swelling   ?  ?Family History  ?Problem Relation Age of Onset  ? Heart attack Father   ? Alcoholism Father   ? Depression Father   ? Heart disease Father   ? Hyperlipidemia Father   ? Hypertension Father   ? Leukemia Mother   ? Hypertension Mother   ? Cancer Mother   ? Depression Mother   ? Early death Mother   ? Miscarriages / [place] Mother   ? Obesity Sister   ? Other Sister   ?     breathing problems  ? Hepatitis C Sister   ? Arthritis Sister   ? Depression  Sister   ? Colon cancer Sister   ? Squamous cell carcinoma Sister   ? Emphysema Brother   ? Obesity Daughter   ? Polycystic ovary syndrome Daughter   ? Depression Daughter   ? Obesity Son   ? Other Son   ?     knee problems  ? Cancer Maternal Grandmother   ?     breast  ? Other Brother   ?     aneursym  ? Arthritis Sister   ? Depression Sister   ? Squamous cell carcinoma Sister   ? Obesity Son   ? Asthma Son   ? Depression Son   ? Obesity Son   ? Diabetes Other   ?     runs on dad's side of the family  ? Arthritis Sister   ? Cancer Sister   ? Depression Sister   ? Obesity Sister   ? Cancer Sister   ? Obesity Sister   ? Depression Son   ? Obesity Son   ? Depression Son   ? Obesity Son   ? Vision loss Maternal Aunt   ? ?Past Surgical History:  ?Procedure Laterality Date  ? BACK SURGERY    ? CHOLECYSTECTOMY    ? COLONOSCOPY  2007  ? sessile sigmoid polyp x 1; path: serrated adenoma  ? COLONOSCOPY N/A 10/12/2014  ? Procedure: COLONOSCOPY;  Surgeon: [name], MD;  [facility];  Service: Endoscopy;  Laterality: N/A;  ? COLONOSCOPY N/A 03/05/2020  ? Procedure: COLONOSCOPY;  Surgeon: Daneil Dolin, MD;  [facility];  Service: Endoscopy;  Laterality: N/A;  9:30  ? EYE SURGERY  2019  ? Cataract  ? JOINT REPLACEMENT    ? BIL knee   ? KNEE ARTHROPLASTY    ? KNEE SURGERY    ? bilateral knee replacement  ? LIPOMA EXCISION    ? x 2  ? [place] SURGERY  2008  ? Rod L4-L5  ? TONSILLECTOMY AND ADENOIDECTOMY    ? ?Social History  ? ?Social History Narrative  ? Not on file  ? ?Immunization History  ?Administered Date(s) Administered  ? Fluad Quad(high Dose 65+) 05/27/2020  ? Influenza Split 06/17/2013  ? Influenza Whole 06/27/2007, 05/08/2008  ? Influenza, High Dose Seasonal PF 06/14/2017, 06/18/2018  ? Influenza,inj,Quad PF,6+ Mos 07/12/2015  ? Influenza-Unspecified 06/14/2017, 06/18/2018, 05/11/2019, 05/14/2021  ? PFIZER(Purple Top)SARS-COV-2 Vaccination 10/12/2019, 11/04/2019, 06/03/2020, 05/23/2021  ?  Pneumococcal Polysaccharide-23 02/18/1993, 06/27/2007  ? Zoster Recombinat (Shingrix) 08/19/2018  ?  ? ?Objective: ?Vital Signs: BP 126/80 (BP Location: Right Arm, Patient Position: Sitting, Cuff Size: Large)   Pulse 69   Ht '5\' 5"'$  (1.651 m)   Wt 234 lb 3.2 oz (106.2 kg)   BMI 38.97 kg/m?   ? ?  Physical Exam ?Vitals and nursing note reviewed.  ?Constitutional:   ?   Appearance: She is well-developed.  ?HENT:  ?   Head: Normocephalic and atraumatic.  ?Eyes:  ?   Conjunctiva/sclera: Conjunctivae normal.  ?Cardiovascular:  ?   Rate and Rhythm: Normal rate and regular rhythm.  ?   Heart sounds: Normal heart sounds.  ?Pulmonary:  ?   Effort: Pulmonary effort is normal.  ?   Breath sounds: Normal breath sounds.  ?Abdominal:  ?   General: Bowel sounds are normal.  ?   Palpations: Abdomen is soft.  ?Musculoskeletal:  ?   Cervical back: Normal range of motion.  ?Lymphadenopathy:  ?   Cervical: No cervical adenopathy.  ?Skin: ?   General: Skin is warm and dry.  ?   Capillary Refill: Capillary refill takes less than 2 seconds.  ?Neurological:  ?   Mental Status: She is alert and oriented to person, place, and time.  ?Psychiatric:     ?   Behavior: Behavior normal.  ?  ? ?Musculoskeletal Exam: C-spine was in good range of motion.  She had good range of motion of bilateral shoulder joints with some discomfort in her left shoulder.  Elbow joints and wrist joints with good range of motion.  She had PIP and DIP thickening with no synovitis.  She had MCP thickening with ulnar deviation.  Hip joints were in good range of motion.  Bilateral knee joints were replaced.  She had some warmth on palpation of her left knee joint without any  swelling or effusion.  She had pedal edema with no tenderness over ankles or MTPs. ? ?CDAI Exam: ?CDAI Score: 2.4  ?Patient Global: 2 mm; Provider Global: 2 mm ?Swollen: 0 ; Tender: 2  ?Joint Exam 11/23/2021  ? ?   Right  Left  ?Glenohumeral      Tender  ?Knee      Tender  ? ? ? ?Investigation: ?No  additional findings. ? ?Imaging: ?No results found. ? ?Recent Labs: ?Lab Results  ?Component Value Date  ? WBC 4.0 11/14/2021  ? HGB 13.2 11/14/2021  ? PLT 175 11/14/2021  ? NA 141 11/14/2021  ? K 4.4 11/14/2021

## 2021-11-14 ENCOUNTER — Other Ambulatory Visit: Payer: Self-pay | Admitting: *Deleted

## 2021-11-14 ENCOUNTER — Telehealth: Payer: Self-pay

## 2021-11-14 DIAGNOSIS — E785 Hyperlipidemia, unspecified: Secondary | ICD-10-CM | POA: Diagnosis not present

## 2021-11-14 DIAGNOSIS — Z79899 Other long term (current) drug therapy: Secondary | ICD-10-CM

## 2021-11-14 NOTE — Telephone Encounter (Signed)
Phlebotomist at [facility] in [place] called stating patient is at their facility and they don't have lab orders.   ?

## 2021-11-14 NOTE — Telephone Encounter (Signed)
Lab Orders released.  

## 2021-11-15 LAB — CMP14+EGFR
ALT: 14 IU/L (ref 0–32)
AST: 23 IU/L (ref 0–40)
Albumin/Globulin Ratio: 1.8 (ref 1.2–2.2)
Albumin: 4.1 g/dL (ref 3.8–4.8)
Alkaline Phosphatase: 78 IU/L (ref 44–121)
BUN/Creatinine Ratio: 16 (ref 12–28)
BUN: 13 mg/dL (ref 8–27)
Bilirubin Total: 0.6 mg/dL (ref 0.0–1.2)
CO2: 25 mmol/L (ref 20–29)
Calcium: 10 mg/dL (ref 8.7–10.3)
Chloride: 105 mmol/L (ref 96–106)
Creatinine, Ser: 0.82 mg/dL (ref 0.57–1.00)
Globulin, Total: 2.3 g/dL (ref 1.5–4.5)
Glucose: 93 mg/dL (ref 70–99)
Potassium: 4.4 mmol/L (ref 3.5–5.2)
Sodium: 141 mmol/L (ref 134–144)
Total Protein: 6.4 g/dL (ref 6.0–8.5)
eGFR: 77 mL/min/{1.73_m2} (ref >59)

## 2021-11-15 LAB — CBC WITH DIFFERENTIAL/PLATELET
Basophils Absolute: 0 10*3/uL (ref 0.0–0.2)
Basos: 1 %
EOS (ABSOLUTE): 0.1 10*3/uL (ref 0.0–0.4)
Eos: 3 %
Hematocrit: 39.2 % (ref 34.0–46.6)
Hemoglobin: 13.2 g/dL (ref 11.1–15.9)
Immature Grans (Abs): 0 10*3/uL (ref 0.0–0.1)
Immature Granulocytes: 0 %
Lymphocytes Absolute: 1.7 10*3/uL (ref 0.7–3.1)
Lymphs: 43 %
MCH: 28.8 pg (ref 26.6–33.0)
MCHC: 33.7 g/dL (ref 31.5–35.7)
MCV: 86 fL (ref 79–97)
Monocytes Absolute: 0.4 10*3/uL (ref 0.1–0.9)
Monocytes: 11 %
Neutrophils Absolute: 1.7 10*3/uL (ref 1.4–7.0)
Neutrophils: 42 %
Platelets: 175 10*3/uL (ref 150–450)
RBC: 4.58 x10E6/uL (ref 3.77–5.28)
RDW: 14.6 % (ref 11.7–15.4)
WBC: 4 10*3/uL (ref 3.4–10.8)

## 2021-11-15 LAB — LIPID PANEL
Chol/HDL Ratio: 2.6 ratio (ref 0.0–4.4)
Cholesterol, Total: 148 mg/dL (ref 100–199)
HDL: 58 mg/dL (ref >39)
LDL Chol Calc (NIH): 82 mg/dL (ref 0–99)
Triglycerides: 33 mg/dL (ref 0–149)
VLDL Cholesterol Cal: 8 mg/dL (ref 5–40)

## 2021-11-22 ENCOUNTER — Other Ambulatory Visit: Payer: Self-pay | Admitting: Family Medicine

## 2021-11-22 DIAGNOSIS — F419 Anxiety disorder, unspecified: Secondary | ICD-10-CM

## 2021-11-23 ENCOUNTER — Ambulatory Visit: Payer: PPO | Admitting: Rheumatology

## 2021-11-23 ENCOUNTER — Encounter: Payer: Self-pay | Admitting: Rheumatology

## 2021-11-23 ENCOUNTER — Ambulatory Visit (INDEPENDENT_AMBULATORY_CARE_PROVIDER_SITE_OTHER): Payer: PPO

## 2021-11-23 VITALS — BP 126/80 | HR 69 | Ht 65.0 in | Wt 234.2 lb

## 2021-11-23 DIAGNOSIS — M51369 Other intervertebral disc degeneration, lumbar region without mention of lumbar back pain or lower extremity pain: Secondary | ICD-10-CM

## 2021-11-23 DIAGNOSIS — M8589 Other specified disorders of bone density and structure, multiple sites: Secondary | ICD-10-CM | POA: Diagnosis not present

## 2021-11-23 DIAGNOSIS — Z96653 Presence of artificial knee joint, bilateral: Secondary | ICD-10-CM

## 2021-11-23 DIAGNOSIS — G8929 Other chronic pain: Secondary | ICD-10-CM

## 2021-11-23 DIAGNOSIS — Z8709 Personal history of other diseases of the respiratory system: Secondary | ICD-10-CM | POA: Diagnosis not present

## 2021-11-23 DIAGNOSIS — I10 Essential (primary) hypertension: Secondary | ICD-10-CM

## 2021-11-23 DIAGNOSIS — M7061 Trochanteric bursitis, right hip: Secondary | ICD-10-CM

## 2021-11-23 DIAGNOSIS — Z8614 Personal history of Methicillin resistant Staphylococcus aureus infection: Secondary | ICD-10-CM | POA: Diagnosis not present

## 2021-11-23 DIAGNOSIS — Z86010 Personal history of colonic polyps: Secondary | ICD-10-CM | POA: Diagnosis not present

## 2021-11-23 DIAGNOSIS — Z8659 Personal history of other mental and behavioral disorders: Secondary | ICD-10-CM

## 2021-11-23 DIAGNOSIS — Z79899 Other long term (current) drug therapy: Secondary | ICD-10-CM | POA: Diagnosis not present

## 2021-11-23 DIAGNOSIS — Z860101 Personal history of adenomatous and serrated colon polyps: Secondary | ICD-10-CM

## 2021-11-23 DIAGNOSIS — Z8639 Personal history of other endocrine, nutritional and metabolic disease: Secondary | ICD-10-CM

## 2021-11-23 DIAGNOSIS — M25511 Pain in right shoulder: Secondary | ICD-10-CM | POA: Diagnosis not present

## 2021-11-23 DIAGNOSIS — M0579 Rheumatoid arthritis with rheumatoid factor of multiple sites without organ or systems involvement: Secondary | ICD-10-CM | POA: Diagnosis not present

## 2021-11-23 DIAGNOSIS — M5136 Other intervertebral disc degeneration, lumbar region: Secondary | ICD-10-CM | POA: Diagnosis not present

## 2021-11-23 DIAGNOSIS — M25512 Pain in left shoulder: Secondary | ICD-10-CM

## 2021-11-23 MED ORDER — ENBREL MINI 50 MG/ML ~~LOC~~ SOCT: 50.0000 mg | SUBCUTANEOUS | 0 refills | Status: DC

## 2021-11-23 MED ORDER — TRIAMCINOLONE ACETONIDE 40 MG/ML IJ SUSP
40.0000 mg | INTRAMUSCULAR | Status: AC | PRN
Start: 2021-11-23 — End: 2021-11-23
  Administered 2021-11-23: 40 mg via INTRA_ARTICULAR

## 2021-11-23 MED ORDER — LIDOCAINE HCL 1 % IJ SOLN
1.5000 mL | INTRAMUSCULAR | Status: AC | PRN
  Administered 2021-11-23: 1.5 mL

## 2021-11-23 NOTE — Patient Instructions (Signed)
Standing Labs ?We placed an order today for your standing lab work.  ? ?Please have your standing labs drawn in June and every 3 months ? ?If possible, please have your labs drawn 2 weeks prior to your appointment so that the provider can discuss your results at your appointment. ? ?Please note that you may see your imaging and lab results in [place] before we have reviewed them. ?We may be awaiting multiple results to interpret others before contacting you. ?Please allow our office up to 72 hours to thoroughly review all of the results before contacting the office for clarification of your results. ? ?We have open lab daily: ?Monday through Thursday from 1:30-4:30 PM and Friday from 1:30-4:00 PM ?at the office of Dr. [name], [facility].   ?Please be advised, all patients with office appointments requiring lab work will take precedent over walk-in lab work.  ?If possible, please come for your lab work on Monday and Friday afternoons, as you may experience shorter wait times. ?The office is located at [address], [place], [place], [place] [address] ?No appointment is necessary.   ?Labs are drawn by [facility]. Please bring your co-pay at the time of your lab draw.  You may receive a bill from [facility] for your lab work. ? ?Please note if you are on Hydroxychloroquine and and an order has been placed for a Hydroxychloroquine level, you will need to have it drawn 4 hours or more after your last dose. ? ?If you wish to have your labs drawn at another location, please call the office 24 hours in advance to send orders. ? ?If you have any questions regarding directions or hours of operation,  ?please call [phone].   ?As a reminder, please drink plenty of water prior to coming for your lab work. Thanks!  ? ?Vaccines ?You are taking a medication(s) that can suppress your immune system.  The following immunizations are recommended: ?Flu annually ?Covid-19  ?Td/Tdap (tetanus, diphtheria, pertussis)  every 10 years ?Pneumonia (Prevnar 15 then Pneumovax 23 at least 1 year apart.  Alternatively, can take Prevnar 20 without needing additional dose) ?Shingrix: 2 doses from 4 weeks to 6 months apart ? ?Please check with your PCP to make sure you are up to date.  ?If you have signs or symptoms of an infection or start antibiotics: ?First, call your PCP for workup of your infection. ?Hold your medication through the infection, until you complete your antibiotics, and until symptoms resolve if you take the following: ?Injectable medication (Actemra, Benlysta, Cimzia, Cosentyx, Enbrel, Humira, Kevzara, Orencia, Remicade, Simponi, [place], [place], [place]) ?Methotrexate ?Leflunomide Jolee Ewing) ?Mycophenolate (Cellcept) ?Roma Kayser, or Rinvoq  ? ?Please get annual skin examination to screen for skin cancer by a dermatologist while you are on Enbrel. ?

## 2021-11-24 DIAGNOSIS — M25562 Pain in left knee: Secondary | ICD-10-CM | POA: Diagnosis not present

## 2021-11-28 ENCOUNTER — Ambulatory Visit: Payer: PPO | Admitting: Rheumatology

## 2021-12-12 ENCOUNTER — Encounter (INDEPENDENT_AMBULATORY_CARE_PROVIDER_SITE_OTHER): Payer: Self-pay | Admitting: Family Medicine

## 2021-12-12 ENCOUNTER — Ambulatory Visit (INDEPENDENT_AMBULATORY_CARE_PROVIDER_SITE_OTHER): Payer: PPO | Admitting: Family Medicine

## 2021-12-12 VITALS — BP 132/80 | HR 60 | Temp 97.9°F | Ht 63.0 in | Wt 227.0 lb

## 2021-12-12 DIAGNOSIS — E669 Obesity, unspecified: Secondary | ICD-10-CM

## 2021-12-12 DIAGNOSIS — Z6841 Body Mass Index (BMI) 40.0 and over, adult: Secondary | ICD-10-CM | POA: Diagnosis not present

## 2021-12-12 DIAGNOSIS — E7849 Other hyperlipidemia: Secondary | ICD-10-CM | POA: Diagnosis not present

## 2021-12-21 NOTE — Progress Notes (Signed)
Chief Complaint:   OBESITY [name] is here to discuss her progress with her obesity treatment plan along with follow-up of her obesity related diagnoses. [name] is on keeping a food journal and adhering to recommended goals of 1350-1450 calories and 85+ protein and states she is following her eating plan approximately 70% of the time. [name] states she is exercising doing gardening and housework 60 minutes 2 times per week.  Today's visit was #: 25 Starting weight: 267 lbs Starting date: 10/31/2018 Today's weight: 227 lbs Today's date: 12/12/2021 Total lbs lost to date: 40 lbs Total lbs lost since last in-office visit: 2  Interim History: [name] has been very consistent with going to Tuesday and Thursday Thai Chi and chair exercise classes.  She did have a fracture in her foot that required her to wear a boot.  She also has gotten a shoulder joint injection.  [name] has been doing well getting her protein in and adjusting her food intake to her sister being in the home.  [name] has found whole foods to be better for her then the increased protein she was doing.   Subjective:   1. Other hyperlipidemia [name] is on Zetia and lipitor.  Fasting lipid panel has been controlled previously, no side effects noted.   Assessment/Plan:   1. Other hyperlipidemia Our plan is to continue lipitor and zetia.   2. Obesity with current BMI of 40.2 [name] is currently in the action stage of change. As such, her goal is to continue with weight loss efforts. She has agreed to keeping a food journal and adhering to recommended goals of 1350-1450 calories and 85+ protein.   Exercise goals: All adults should avoid inactivity. Some physical activity is better than none, and adults who participate in any amount of physical activity gain some health benefits.  [name] needs to be consistent with activity.  Behavioral modification strategies: increasing lean protein intake, meal planning and cooking  strategies, and keeping healthy foods in the home.  [name] has agreed to follow-up with our clinic in 12 weeks. She was informed of the importance of frequent follow-up visits to maximize her success with intensive lifestyle modifications for her multiple health conditions.   Objective:   Blood pressure 132/80, pulse 60, temperature 97.9 F (36.6 C), height '5\' 3"'$  (1.6 m), weight 227 lb (103 kg), SpO2 99 %. Body mass index is 40.21 kg/m.  General: Cooperative, alert, well developed, in no acute distress. HEENT: Conjunctivae and lids unremarkable. Cardiovascular: Regular rhythm.  Lungs: Normal work of breathing. Neurologic: No focal deficits.   Lab Results  Component Value Date   CREATININE 0.82 11/14/2021   BUN 13 11/14/2021   NA 141 11/14/2021   K 4.4 11/14/2021   CL 105 11/14/2021   CO2 25 11/14/2021   Lab Results  Component Value Date   ALT 14 11/14/2021   AST 23 11/14/2021   ALKPHOS 78 11/14/2021   BILITOT 0.6 11/14/2021   Lab Results  Component Value Date   HGBA1C 5.4 02/27/2019   HGBA1C 5.5 10/31/2018   Lab Results  Component Value Date   INSULIN 8.3 02/27/2019   INSULIN 12.3 10/31/2018   Lab Results  Component Value Date   TSH 2.53 03/17/2020   Lab Results  Component Value Date   CHOL 148 11/14/2021   HDL 58 11/14/2021   LDLCALC 82 11/14/2021   TRIG 33 11/14/2021   CHOLHDL 2.6 11/14/2021   Lab Results  Component Value Date   VD25OH 32.7  09/29/2020   VD25OH 49.1 03/09/2020   VD25OH 41.6 02/27/2019   Lab Results  Component Value Date   WBC 4.0 11/14/2021   HGB 13.2 11/14/2021   HCT 39.2 11/14/2021   MCV 86 11/14/2021   PLT 175 11/14/2021   No results found for: IRON, TIBC, FERRITIN  Attestation Statements:   Reviewed by clinician on day of visit: allergies, medications, problem list, medical history, surgical history, family history, social history, and previous encounter notes.  I, Davy Pique, am acting as transcriptionist for  Coralie Common, MD.  I have reviewed the above documentation for accuracy and completeness, and I agree with the above. - Coralie Common, MD

## 2021-12-26 ENCOUNTER — Encounter: Payer: Self-pay | Admitting: Family Medicine

## 2021-12-26 ENCOUNTER — Ambulatory Visit (INDEPENDENT_AMBULATORY_CARE_PROVIDER_SITE_OTHER): Payer: PPO | Admitting: Family Medicine

## 2021-12-26 VITALS — BP 152/90 | HR 84 | Temp 98.0°F | Wt 233.0 lb

## 2021-12-26 DIAGNOSIS — S40861A Insect bite (nonvenomous) of right upper arm, initial encounter: Secondary | ICD-10-CM

## 2021-12-26 DIAGNOSIS — W57XXXA Bitten or stung by nonvenomous insect and other nonvenomous arthropods, initial encounter: Secondary | ICD-10-CM | POA: Diagnosis not present

## 2021-12-26 DIAGNOSIS — I10 Essential (primary) hypertension: Secondary | ICD-10-CM

## 2021-12-26 DIAGNOSIS — M0579 Rheumatoid arthritis with rheumatoid factor of multiple sites without organ or systems involvement: Secondary | ICD-10-CM | POA: Diagnosis not present

## 2021-12-26 DIAGNOSIS — Z23 Encounter for immunization: Secondary | ICD-10-CM

## 2021-12-26 DIAGNOSIS — E785 Hyperlipidemia, unspecified: Secondary | ICD-10-CM | POA: Diagnosis not present

## 2021-12-26 MED ORDER — TRIAMCINOLONE ACETONIDE 0.5 % EX OINT: 1.0000 "application " | TOPICAL_OINTMENT | Freq: Two times a day (BID) | CUTANEOUS | 0 refills | Status: DC

## 2021-12-26 MED ORDER — TRAMADOL HCL 50 MG PO TABS: 50.0000 mg | ORAL_TABLET | Freq: Three times a day (TID) | ORAL | 0 refills | Status: DC | PRN

## 2021-12-26 NOTE — Assessment & Plan Note (Signed)
Topical triamcinolone as directed. ?

## 2021-12-26 NOTE — Patient Instructions (Signed)
Tylenol 1000 mg three times daily as needed. ? ?Tramadol if needed. ? ?Topical for the bug bite. ? ?Follow up in 6 months. ? ?Take care ? ?Dr. Lacinda Axon  ?

## 2021-12-26 NOTE — Progress Notes (Signed)
? ?Subjective:  ?Patient ID: [name], female    DOB: May 11, 1951  Age: 71 y.o. MRN: [id number] ? ?CC: ?Chief Complaint  ?Patient presents with  ? Hyperlipidemia  ?  Doing well on medications. Has bite of some sort on right upper arm-has been there about 3-4 days and it itchy.   ? ? ?HPI: ? ?71 year old female with hypertension, GERD, osteoarthritis as well as rheumatoid arthritis, hyperlipidemia, obesity presents for follow-up. ? ?Patient reports that she is experiencing significant amount of pain related to her rheumatoid arthritis.  She is currently on Enbrel which is managed by rheumatology.  Patient states that she uses Tylenol in the evening to help with her discomfort.  She would like to discuss how to manage her pain. ? ?Hyperlipidemia is well controlled on Lipitor and Zetia.  Tolerating well. ? ?Patient requesting pneumococcal vaccination today. ? ?Patient's blood pressure elevated today but was improved on recheck. ? ?Patient reports that she suffered a bug bite 3 to 4 days ago.  It is located on the medial aspect of the right upper arm.  Associated itching.  Is also red.  No fever.  No relieving factors. ? ?Patient Active Problem List  ? Diagnosis Date Noted  ? Insect bite 12/26/2021  ? Anxiety 06/28/2021  ? GERD (gastroesophageal reflux disease) 06/28/2021  ? History of total knee replacement, bilateral 10/30/2018  ? Seropositive rheumatoid arthritis of multiple sites ([place]) 07/20/2014  ? Obesity (BMI 30-39.9) 12/19/2007  ? DDD (degenerative disc disease), lumbar 06/27/2007  ? Osteopenia of both forearms 06/27/2007  ? Essential hypertension 04/04/2007  ? Hyperlipidemia 02/21/2007  ? ? ?Social Hx   ?Social History  ? ?Socioeconomic History  ? Marital status: Married  ?  Spouse name: Addylin Manke  ? Number of children: 4  ? Years of education: Not on file  ? Highest education level: Not on file  ?Occupational History  ? Occupation: Stay at home  ?Tobacco Use  ? Smoking status: Never  ?  Passive  exposure: Never  ? Smokeless tobacco: Never  ?Vaping Use  ? Vaping Use: Never used  ?Substance and Sexual Activity  ? Alcohol use: Yes  ?  Comment: sometimes; rarely  ? Drug use: No  ? Sexual activity: Yes  ?  Birth control/protection: Post-menopausal  ?Other Topics Concern  ? Not on file  ?Social History Narrative  ? Not on file  ? ?Social Determinants of Health  ? ?Financial Resource Strain: Low Risk   ? Difficulty of Paying Living Expenses: Not very hard  ?Food Insecurity: No Food Insecurity  ? Worried About Charity fundraiser in the Last Year: Never true  ? Ran Out of Food in the Last Year: Never true  ?Transportation Needs: No Transportation Needs  ? Lack of Transportation (Medical): No  ? Lack of Transportation (Non-Medical): No  ?Physical Activity: Sufficiently Active  ? Days of Exercise per Week: 3 days  ? Minutes of Exercise per Session: 50 min  ?Stress: Stress Concern Present  ? Feeling of Stress : To some extent  ?Social Connections: Socially Integrated  ? Frequency of Communication with Friends and Family: More than three times a week  ? Frequency of Social Gatherings with Friends and Family: More than three times a week  ? Attends Religious Services: More than 4 times per year  ? Active Member of Clubs or Organizations: Yes  ? Attends Archivist Meetings: More than 4 times per year  ? Marital Status: Married  ? ? ?  Review of Systems ?Per HPI ? ?Objective:  ?BP (!) 152/90   Pulse 84   Temp 98 ?F (36.7 ?C)   Wt 233 lb (105.7 kg)   SpO2 99%   BMI 41.27 kg/m?  ? ? ?  12/26/2021  ?  8:27 AM 12/12/2021  ?  8:00 AM 11/23/2021  ?  1:31 PM  ?BP/Weight  ?Systolic BP 545 625 638  ?Diastolic BP 90 80 80  ?Wt. (Lbs) 233 227 234.2  ?BMI 41.27 kg/m2 40.21 kg/m2 38.97 kg/m2  ? ? ?Physical Exam ?Vitals and nursing note reviewed.  ?Constitutional:   ?   General: She is not in acute distress. ?   Appearance: Normal appearance.  ?HENT:  ?   Head: Normocephalic and atraumatic.  ?Eyes:  ?   General:     ?   Right  eye: No discharge.     ?   Left eye: No discharge.  ?   Conjunctiva/sclera: Conjunctivae normal.  ?Cardiovascular:  ?   Rate and Rhythm: Normal rate and regular rhythm.  ?Pulmonary:  ?   Effort: Pulmonary effort is normal.  ?   Breath sounds: Normal breath sounds. No wheezing, rhonchi or rales.  ?Skin: ?   Comments: Right upper medial arm with an area of erythema.  There is a central raised area consistent with a bug bite.  ?Neurological:  ?   Mental Status: She is alert.  ?Psychiatric:     ?   Mood and Affect: Mood normal.     ?   Behavior: Behavior normal.  ? ? ?Lab Results  ?Component Value Date  ? WBC 4.0 11/14/2021  ? HGB 13.2 11/14/2021  ? HCT 39.2 11/14/2021  ? PLT 175 11/14/2021  ? GLUCOSE 93 11/14/2021  ? CHOL 148 11/14/2021  ? TRIG 33 11/14/2021  ? HDL 58 11/14/2021  ? [place] 82 11/14/2021  ? ALT 14 11/14/2021  ? AST 23 11/14/2021  ? NA 141 11/14/2021  ? K 4.4 11/14/2021  ? CL 105 11/14/2021  ? CREATININE 0.82 11/14/2021  ? BUN 13 11/14/2021  ? CO2 25 11/14/2021  ? TSH 2.53 03/17/2020  ? INR 1.75 (H) 03/03/2010  ? HGBA1C 5.4 02/27/2019  ? ? ? ?Assessment & Plan:  ? ?Problem List Items Addressed This Visit   ? ?  ? Cardiovascular and Mediastinum  ? Essential hypertension - Primary  ?  BP mildly elevated today.  Repeated and was in the 937D systolic.  We will continue to monitor. ? ?  ?  ?  ? Musculoskeletal and Integument  ? Seropositive rheumatoid arthritis of multiple sites [facility])  ?  Tylenol as needed for pain.  Tramadol to be used as needed as well. ? ?  ?  ? Relevant Medications  ? traMADol (ULTRAM) 50 MG tablet  ?  ? Other  ? Hyperlipidemia  ?  Well-controlled.  Continue Zetia and atorvastatin. ? ?  ?  ? Insect bite  ?  Topical triamcinolone as directed. ? ?  ?  ? ?Other Visit Diagnoses   ? ? Need for vaccination      ? Relevant Orders  ? Pneumococcal conjugate vaccine 20-valent (Prevnar 20) (Completed)  ? ?  ? ? ?Meds ordered this encounter  ?Medications  ? triamcinolone ointment (KENALOG) 0.5 %  ?   Sig: Apply 1 application. topically 2 (two) times daily.  ?  Dispense:  30 g  ?  Refill:  0  ? traMADol (ULTRAM) 50 MG tablet  ?  Sig: Take 1-2 tablets (50-100 mg total) by mouth every 8 (eight) hours as needed.  ?  Dispense:  20 tablet  ?  Refill:  0  ? ? ?Follow-up:  Return in about 6 months (around 06/28/2022). ? ?Thersa Salt DO ?[place] ? ?

## 2021-12-26 NOTE — Assessment & Plan Note (Signed)
BP mildly elevated today.  Repeated and was in the 075P systolic.  We will continue to monitor. ?

## 2021-12-26 NOTE — Assessment & Plan Note (Signed)
Tylenol as needed for pain.  Tramadol to be used as needed as well. ?

## 2021-12-26 NOTE — Assessment & Plan Note (Signed)
Well-controlled.  Continue Zetia and atorvastatin. ?

## 2021-12-30 DIAGNOSIS — M25562 Pain in left knee: Secondary | ICD-10-CM | POA: Diagnosis not present

## 2022-01-12 DIAGNOSIS — L309 Dermatitis, unspecified: Secondary | ICD-10-CM | POA: Diagnosis not present

## 2022-02-02 ENCOUNTER — Ambulatory Visit (INDEPENDENT_AMBULATORY_CARE_PROVIDER_SITE_OTHER): Payer: PPO | Admitting: Family Medicine

## 2022-02-02 VITALS — BP 141/85 | HR 75 | Temp 98.5°F | Ht 63.0 in | Wt 234.0 lb

## 2022-02-02 DIAGNOSIS — R21 Rash and other nonspecific skin eruption: Secondary | ICD-10-CM | POA: Insufficient documentation

## 2022-02-02 MED ORDER — FLUCONAZOLE 150 MG PO TABS: 150.0000 mg | ORAL_TABLET | ORAL | 0 refills | Status: DC

## 2022-02-02 MED ORDER — CLOBETASOL PROPIONATE 0.05 % EX OINT: 1.0000 | TOPICAL_OINTMENT | Freq: Two times a day (BID) | CUTANEOUS | 0 refills | Status: DC

## 2022-02-02 NOTE — Patient Instructions (Signed)
Medication as prescribed.  If it continues to persist, please let me know and we will go from there.  Take care  Dr. Lacinda Axon

## 2022-02-02 NOTE — Assessment & Plan Note (Signed)
DDx: Tinea pedis, psoriasis, plantar pustulosis, dihydrotic eczema. Diagnosis unclear at this time. Treating empirically with Diflucan and Clobetasol.

## 2022-02-03 ENCOUNTER — Other Ambulatory Visit: Payer: Self-pay | Admitting: Family Medicine

## 2022-02-03 NOTE — Progress Notes (Signed)
Subjective:  Patient ID: [name], female    DOB: 01/02/1951  Age: 71 y.o. MRN: [id number]  CC: Chief Complaint  Patient presents with   Blister    On bottom of right foot. Pt wants to know if it's shingles and if  using triamcinolone ointment will take care of it.    HPI:  71 year old female presents for evaluation of the above  Patient reports a 1 month history of a rash of the right heel.  She states that it is itchy.  No known inciting factor.  She has applied triamcinolone and use Benadryl with some improvement.  She states that some areas look like blisters and then rupture.  No new changes or exposures.  No other associated symptoms.  No other complaints  Patient Active Problem List   Diagnosis Date Noted   Rash of foot 02/02/2022   Anxiety 06/28/2021   GERD (gastroesophageal reflux disease) 06/28/2021   History of total knee replacement, bilateral 10/30/2018   Seropositive rheumatoid arthritis of multiple sites ([place]) 07/20/2014   Obesity (BMI 30-39.9) 12/19/2007   DDD (degenerative disc disease), lumbar 06/27/2007   Osteopenia of both forearms 06/27/2007   Essential hypertension 04/04/2007   Hyperlipidemia 02/21/2007    Social Hx   Social History   Socioeconomic History   Marital status: Married    Spouse name: Dylana Shaw   Number of children: 4   Years of education: Not on file   Highest education level: Not on file  Occupational History   Occupation: Stay at home  Tobacco Use   Smoking status: Never    Passive exposure: Never   Smokeless tobacco: Never  Vaping Use   Vaping Use: Never used  Substance and Sexual Activity   Alcohol use: Yes    Comment: sometimes; rarely   Drug use: No   Sexual activity: Yes    Birth control/protection: Post-menopausal  Other Topics Concern   Not on file  Social History Narrative   Not on file   Social Determinants of Health   Financial Resource Strain: Low Risk  (11/08/2021)   Overall Financial Resource  Strain (CARDIA)    Difficulty of Paying Living Expenses: Not very hard  Food Insecurity: No Food Insecurity (11/08/2021)   Hunger Vital Sign    Worried About Running Out of Food in the Last Year: Never true    Ran Out of Food in the Last Year: Never true  Transportation Needs: No Transportation Needs (11/08/2021)   PRAPARE - Hydrologist (Medical): No    Lack of Transportation (Non-Medical): No  Physical Activity: Sufficiently Active (11/08/2021)   Exercise Vital Sign    Days of Exercise per Week: 3 days    Minutes of Exercise per Session: 50 min  Stress: Stress Concern Present (11/08/2021)   [place]    Feeling of Stress : To some extent  Social Connections: Socially Integrated (11/08/2021)   Social Connection and Isolation Panel [NHANES]    Frequency of Communication with Friends and Family: More than three times a week    Frequency of Social Gatherings with Friends and Family: More than three times a week    Attends Religious Services: More than 4 times per year    Active Member of [employer] or Organizations: Yes    Attends Music therapist: More than 4 times per year    Marital Status: Married    Review of Systems Per  HPI  Objective:  BP (!) 141/85   Pulse 75   Temp 98.5 F (36.9 C) (Oral)   Ht '5\' 3"'$  (1.6 m)   Wt 234 lb (106.1 kg)   SpO2 96%   BMI 41.45 kg/m      02/02/2022   10:36 AM 12/26/2021    8:27 AM 12/12/2021    8:00 AM  BP/Weight  Systolic BP 786 767 209  Diastolic BP 85 90 80  Wt. (Lbs) 234 233 227  BMI 41.45 kg/m2 41.27 kg/m2 40.21 kg/m2    Physical Exam Vitals and nursing note reviewed.  Constitutional:      General: She is not in acute distress.    Appearance: Normal appearance.  HENT:     Head: Normocephalic and atraumatic.  Pulmonary:     Effort: Pulmonary effort is normal. No respiratory distress.  Skin:    Comments: Right foot -plantar  aspect of the heel and extending upward, there is dry, scaly rash with papules.  Neurological:     Mental Status: She is alert.  Psychiatric:        Mood and Affect: Mood normal.        Behavior: Behavior normal.     Lab Results  Component Value Date   WBC 4.0 11/14/2021   HGB 13.2 11/14/2021   HCT 39.2 11/14/2021   PLT 175 11/14/2021   GLUCOSE 93 11/14/2021   CHOL 148 11/14/2021   TRIG 33 11/14/2021   HDL 58 11/14/2021   LDLCALC 82 11/14/2021   ALT 14 11/14/2021   AST 23 11/14/2021   NA 141 11/14/2021   K 4.4 11/14/2021   CL 105 11/14/2021   CREATININE 0.82 11/14/2021   BUN 13 11/14/2021   CO2 25 11/14/2021   TSH 2.53 03/17/2020   INR 1.75 (H) 03/03/2010   HGBA1C 5.4 02/27/2019     Assessment & Plan:   Problem List Items Addressed This Visit       Musculoskeletal and Integument   Rash of foot - Primary    DDx: Tinea pedis, psoriasis, plantar pustulosis, dihydrotic eczema. Diagnosis unclear at this time. Treating empirically with Diflucan and Clobetasol.        Meds ordered this encounter  Medications   fluconazole (DIFLUCAN) 150 MG tablet    Sig: Take 1 tablet (150 mg total) by mouth once a week.    Dispense:  4 tablet    Refill:  0   clobetasol ointment (TEMOVATE) 0.05 %    Sig: Apply 1 Application topically 2 (two) times daily.    Dispense:  60 g    Refill:  0    Follow-up:  No follow-ups on file.  [place]

## 2022-02-09 DIAGNOSIS — B353 Tinea pedis: Secondary | ICD-10-CM | POA: Diagnosis not present

## 2022-02-09 DIAGNOSIS — L309 Dermatitis, unspecified: Secondary | ICD-10-CM | POA: Diagnosis not present

## 2022-03-06 DIAGNOSIS — M0579 Rheumatoid arthritis with rheumatoid factor of multiple sites without organ or systems involvement: Secondary | ICD-10-CM

## 2022-03-06 MED ORDER — ENBREL MINI 50 MG/ML ~~LOC~~ SOCT: 50.0000 mg | SUBCUTANEOUS | 0 refills | Status: DC

## 2022-03-06 NOTE — Telephone Encounter (Signed)
Next Visit: 05/09/2022  Last Visit: 11/23/2021  Last Fill: 11/23/2021  DX: Seropositive rheumatoid arthritis of multiple sites   Current Dose per office note 11/23/2021: Enbrel 50 mg sq injections once weekly  Labs: 11/14/2021 CBC and CMP WNL  TB Gold: 01/27/2021 Neg    Okay to refill Enbrel?

## 2022-03-06 NOTE — Telephone Encounter (Signed)
Patient is overdue to update lab work.

## 2022-03-07 ENCOUNTER — Telehealth: Payer: Self-pay | Admitting: *Deleted

## 2022-03-07 DIAGNOSIS — Z9225 Personal history of immunosupression therapy: Secondary | ICD-10-CM

## 2022-03-07 DIAGNOSIS — Z111 Encounter for screening for respiratory tuberculosis: Secondary | ICD-10-CM

## 2022-03-07 DIAGNOSIS — Z79899 Other long term (current) drug therapy: Secondary | ICD-10-CM

## 2022-03-07 NOTE — Telephone Encounter (Signed)
Lab Orders released.  

## 2022-03-08 ENCOUNTER — Telehealth: Payer: Self-pay | Admitting: Rheumatology

## 2022-03-08 NOTE — Telephone Encounter (Signed)
Chris from [employer] called the office requesting a verbal for the patient. She stated that the patient reached out to the manufacturer to report a damaged Enbrel Auto-touch device. Gerald Stabs states they will provide one to the patient at no charge, they just need verbal authorization to do so.  Ref# [id number] Phone# [phone]

## 2022-03-08 NOTE — Telephone Encounter (Signed)
Spoke with Barrett at [facility] and gave verbal authorization to send replacement Auto touch device.

## 2022-03-09 DIAGNOSIS — Z79899 Other long term (current) drug therapy: Secondary | ICD-10-CM | POA: Diagnosis not present

## 2022-03-09 DIAGNOSIS — B353 Tinea pedis: Secondary | ICD-10-CM | POA: Diagnosis not present

## 2022-03-09 DIAGNOSIS — L309 Dermatitis, unspecified: Secondary | ICD-10-CM | POA: Diagnosis not present

## 2022-03-09 DIAGNOSIS — Z111 Encounter for screening for respiratory tuberculosis: Secondary | ICD-10-CM | POA: Diagnosis not present

## 2022-03-09 DIAGNOSIS — Z9225 Personal history of immunosupression therapy: Secondary | ICD-10-CM | POA: Diagnosis not present

## 2022-03-10 NOTE — Progress Notes (Signed)
CBC and CMP are normal.  Calcium is mildly elevated.  Please avoid calcium supplement.

## 2022-03-14 LAB — CBC WITH DIFFERENTIAL/PLATELET
Basophils Absolute: 0 10*3/uL (ref 0.0–0.2)
Basos: 0 %
EOS (ABSOLUTE): 0.1 10*3/uL (ref 0.0–0.4)
Eos: 2 %
Hematocrit: 43.7 % (ref 34.0–46.6)
Hemoglobin: 14.8 g/dL (ref 11.1–15.9)
Immature Grans (Abs): 0 10*3/uL (ref 0.0–0.1)
Immature Granulocytes: 0 %
Lymphocytes Absolute: 1.7 10*3/uL (ref 0.7–3.1)
Lymphs: 36 %
MCH: 31 pg (ref 26.6–33.0)
MCHC: 33.9 g/dL (ref 31.5–35.7)
MCV: 92 fL (ref 79–97)
Monocytes Absolute: 0.5 10*3/uL (ref 0.1–0.9)
Monocytes: 10 %
Neutrophils Absolute: 2.4 10*3/uL (ref 1.4–7.0)
Neutrophils: 52 %
Platelets: 178 10*3/uL (ref 150–450)
RBC: 4.77 x10E6/uL (ref 3.77–5.28)
RDW: 14.7 % (ref 11.7–15.4)
WBC: 4.7 10*3/uL (ref 3.4–10.8)

## 2022-03-14 LAB — CMP14+EGFR
ALT: 17 IU/L (ref 0–32)
AST: 22 IU/L (ref 0–40)
Albumin/Globulin Ratio: 1.8 (ref 1.2–2.2)
Albumin: 4.2 g/dL (ref 3.9–4.9)
Alkaline Phosphatase: 69 IU/L (ref 44–121)
BUN/Creatinine Ratio: 16 (ref 12–28)
BUN: 14 mg/dL (ref 8–27)
Bilirubin Total: 0.6 mg/dL (ref 0.0–1.2)
CO2: 25 mmol/L (ref 20–29)
Calcium: 10.4 mg/dL — ABNORMAL HIGH (ref 8.7–10.3)
Chloride: 104 mmol/L (ref 96–106)
Creatinine, Ser: 0.89 mg/dL (ref 0.57–1.00)
Globulin, Total: 2.3 g/dL (ref 1.5–4.5)
Glucose: 96 mg/dL (ref 70–99)
Potassium: 4.3 mmol/L (ref 3.5–5.2)
Sodium: 141 mmol/L (ref 134–144)
Total Protein: 6.5 g/dL (ref 6.0–8.5)
eGFR: 70 mL/min/{1.73_m2} (ref >59)

## 2022-03-14 LAB — QUANTIFERON-TB GOLD PLUS
QuantiFERON Mitogen Value: >10 IU/mL
QuantiFERON Nil Value: 0.13 IU/mL
QuantiFERON TB1 Ag Value: 0.14 IU/mL
QuantiFERON TB2 Ag Value: 0.06 IU/mL
QuantiFERON-TB Gold Plus: NEGATIVE

## 2022-03-14 NOTE — Progress Notes (Signed)
TB Gold is negative.

## 2022-03-18 ENCOUNTER — Other Ambulatory Visit: Payer: Self-pay | Admitting: Family Medicine

## 2022-03-18 DIAGNOSIS — E7849 Other hyperlipidemia: Secondary | ICD-10-CM

## 2022-03-18 DIAGNOSIS — F419 Anxiety disorder, unspecified: Secondary | ICD-10-CM

## 2022-03-22 DIAGNOSIS — U071 COVID-19: Secondary | ICD-10-CM | POA: Diagnosis not present

## 2022-03-29 ENCOUNTER — Encounter (INDEPENDENT_AMBULATORY_CARE_PROVIDER_SITE_OTHER): Payer: Self-pay

## 2022-04-04 ENCOUNTER — Encounter: Payer: Self-pay | Admitting: Family Medicine

## 2022-04-04 ENCOUNTER — Ambulatory Visit (INDEPENDENT_AMBULATORY_CARE_PROVIDER_SITE_OTHER): Payer: PPO | Admitting: Family Medicine

## 2022-04-04 DIAGNOSIS — J069 Acute upper respiratory infection, unspecified: Secondary | ICD-10-CM | POA: Insufficient documentation

## 2022-04-04 MED ORDER — AMOXICILLIN-POT CLAVULANATE 875-125 MG PO TABS: 1.0000 | ORAL_TABLET | Freq: Two times a day (BID) | ORAL | 0 refills | Status: DC

## 2022-04-04 NOTE — Assessment & Plan Note (Signed)
Post COVID symptoms versus superimposed bacterial infection.  Advised supportive care and watchful waiting for now.  Augmentin to be given if she fails improve or worsens.

## 2022-04-04 NOTE — Progress Notes (Signed)
Subjective:  Patient ID: [name], female    DOB: 10-09-1950  Age: 71 y.o. MRN: [id number]  CC: Chief Complaint  Patient presents with   covid positive 2 weeks ago     Nasal congestion , cough Treated with paxlovid    HPI:  71 year old female presents for evaluation of the above.  Patient recently had COVID-19 2 weeks ago.  Occurred while she was traveling.  She was treated with Paxlovid.  Patient reports that she continues to not feel well.  She has postnasal drip, cough, and congestion.  She has some associated runny nose as well.  No fever.  Reports that she feels fatigued.  She is concerned that this is secondary to recent COVID-19.  She is curious to how long this is going to last.  Patient Active Problem List   Diagnosis Date Noted   Upper respiratory infection 04/04/2022   Anxiety 06/28/2021   GERD (gastroesophageal reflux disease) 06/28/2021   History of total knee replacement, bilateral 10/30/2018   Seropositive rheumatoid arthritis of multiple sites ([place]) 07/20/2014   Obesity (BMI 30-39.9) 12/19/2007   DDD (degenerative disc disease), lumbar 06/27/2007   Osteopenia of both forearms 06/27/2007   Essential hypertension 04/04/2007   Hyperlipidemia 02/21/2007    Social Hx   Social History   Socioeconomic History   Marital status: Married    Spouse name: [name]   Number of children: 4   Years of education: Not on file   Highest education level: Not on file  Occupational History   Occupation: Stay at home  Tobacco Use   Smoking status: Never    Passive exposure: Never   Smokeless tobacco: Never  Vaping Use   Vaping Use: Never used  Substance and Sexual Activity   Alcohol use: Yes    Comment: sometimes; rarely   Drug use: No   Sexual activity: Yes    Birth control/protection: Post-menopausal  Other Topics Concern   Not on file  Social History Narrative   Not on file   Social Determinants of Health   Financial Resource Strain: Low Risk   (11/08/2021)   Overall Financial Resource Strain (CARDIA)    Difficulty of Paying Living Expenses: Not very hard  Food Insecurity: No Food Insecurity (11/08/2021)   Hunger Vital Sign    Worried About Running Out of Food in the Last Year: Never true    Ran Out of Food in the Last Year: Never true  Transportation Needs: No Transportation Needs (11/08/2021)   PRAPARE - Hydrologist (Medical): No    Lack of Transportation (Non-Medical): No  Physical Activity: Sufficiently Active (11/08/2021)   Exercise Vital Sign    Days of Exercise per Week: 3 days    Minutes of Exercise per Session: 50 min  Stress: Stress Concern Present (11/08/2021)   [place]    Feeling of Stress : To some extent  Social Connections: Socially Integrated (11/08/2021)   Social Connection and Isolation Panel [NHANES]    Frequency of Communication with Friends and Family: More than three times a week    Frequency of Social Gatherings with Friends and Family: More than three times a week    Attends Religious Services: More than 4 times per year    Active Member of [employer] or Organizations: Yes    Attends Music therapist: More than 4 times per year    Marital Status: Married  Review of Systems Per HPI  Objective:  BP 135/73   Pulse 74   Temp 98.6 F (37 C)   Ht '5\' 3"'$  (1.6 m)   Wt 237 lb (107.5 kg)   SpO2 98%   BMI 41.98 kg/m      04/04/2022   11:08 AM 02/02/2022   10:36 AM 12/26/2021    8:27 AM  BP/Weight  Systolic BP 253 664 403  Diastolic BP 73 85 90  Wt. (Lbs) 237 234 233  BMI 41.98 kg/m2 41.45 kg/m2 41.27 kg/m2    Physical Exam Vitals and nursing note reviewed.  Constitutional:      General: She is not in acute distress.    Appearance: Normal appearance.  HENT:     Head: Normocephalic and atraumatic.     Right Ear: Tympanic membrane normal.     Left Ear: Tympanic membrane normal.     Nose:  No rhinorrhea.  Eyes:     General:        Right eye: No discharge.        Left eye: No discharge.     Conjunctiva/sclera: Conjunctivae normal.  Cardiovascular:     Rate and Rhythm: Normal rate and regular rhythm.  Pulmonary:     Effort: Pulmonary effort is normal.     Breath sounds: Normal breath sounds. No wheezing, rhonchi or rales.  Neurological:     Mental Status: She is alert.  Psychiatric:        Mood and Affect: Mood normal.        Behavior: Behavior normal.     Lab Results  Component Value Date   WBC 4.7 03/09/2022   HGB 14.8 03/09/2022   HCT 43.7 03/09/2022   PLT 178 03/09/2022   GLUCOSE 96 03/09/2022   CHOL 148 11/14/2021   TRIG 33 11/14/2021   HDL 58 11/14/2021   LDLCALC 82 11/14/2021   ALT 17 03/09/2022   AST 22 03/09/2022   NA 141 03/09/2022   K 4.3 03/09/2022   CL 104 03/09/2022   CREATININE 0.89 03/09/2022   BUN 14 03/09/2022   CO2 25 03/09/2022   TSH 2.53 03/17/2020   INR 1.75 (H) 03/03/2010   HGBA1C 5.4 02/27/2019     Assessment & Plan:   Problem List Items Addressed This Visit       Respiratory   Upper respiratory infection    Post COVID symptoms versus superimposed bacterial infection.  Advised supportive care and watchful waiting for now.  Augmentin to be given if she fails improve or worsens.       Meds ordered this encounter  Medications   amoxicillin-clavulanate (AUGMENTIN) 875-125 MG tablet    Sig: Take 1 tablet by mouth 2 (two) times daily.    Dispense:  14 tablet    Refill:  [place]

## 2022-04-04 NOTE — Patient Instructions (Signed)
Give it a little time.  If does not improve, start the antibiotic.  Take care  Dr. Lacinda Axon

## 2022-04-10 ENCOUNTER — Encounter (INDEPENDENT_AMBULATORY_CARE_PROVIDER_SITE_OTHER): Payer: Self-pay | Admitting: Family Medicine

## 2022-04-10 ENCOUNTER — Ambulatory Visit (INDEPENDENT_AMBULATORY_CARE_PROVIDER_SITE_OTHER): Payer: PPO | Admitting: Family Medicine

## 2022-04-10 VITALS — BP 125/71 | HR 71 | Temp 98.3°F | Ht 63.0 in | Wt 240.0 lb

## 2022-04-10 DIAGNOSIS — E559 Vitamin D deficiency, unspecified: Secondary | ICD-10-CM | POA: Diagnosis not present

## 2022-04-10 DIAGNOSIS — Z6841 Body Mass Index (BMI) 40.0 and over, adult: Secondary | ICD-10-CM | POA: Diagnosis not present

## 2022-04-10 DIAGNOSIS — E669 Obesity, unspecified: Secondary | ICD-10-CM

## 2022-04-10 DIAGNOSIS — E7849 Other hyperlipidemia: Secondary | ICD-10-CM

## 2022-04-13 ENCOUNTER — Ambulatory Visit (HOSPITAL_COMMUNITY)
Admission: RE | Admit: 2022-04-13 | Discharge: 2022-04-13 | Disposition: A | Discharge status: Home / Self Care | Payer: PPO | Source: Ambulatory Visit | Attending: Family Medicine | Admitting: Family Medicine

## 2022-04-13 ENCOUNTER — Encounter: Payer: Self-pay | Admitting: Family Medicine

## 2022-04-13 ENCOUNTER — Ambulatory Visit (INDEPENDENT_AMBULATORY_CARE_PROVIDER_SITE_OTHER): Payer: PPO | Admitting: Family Medicine

## 2022-04-13 ENCOUNTER — Telehealth: Payer: Self-pay | Admitting: *Deleted

## 2022-04-13 VITALS — BP 138/78 | HR 68 | Temp 97.8°F | Wt 244.2 lb

## 2022-04-13 DIAGNOSIS — R202 Paresthesia of skin: Secondary | ICD-10-CM | POA: Diagnosis not present

## 2022-04-13 DIAGNOSIS — M545 Low back pain, unspecified: Secondary | ICD-10-CM | POA: Diagnosis not present

## 2022-04-13 DIAGNOSIS — R200 Anesthesia of skin: Secondary | ICD-10-CM | POA: Diagnosis not present

## 2022-04-13 MED ORDER — GABAPENTIN 300 MG PO CAPS: 300.0000 mg | ORAL_CAPSULE | Freq: Three times a day (TID) | ORAL | 0 refills | Status: DC

## 2022-04-13 NOTE — Patient Instructions (Signed)
Medication as prescribed (start with it just at night).  Xray today.  Call/message with concerns.  Take care  Dr. Lacinda Axon

## 2022-04-13 NOTE — Telephone Encounter (Signed)
Fax received from [facility] requesting refill of tramadol '50mg'$  #20 take 1-2 tablets by mouth every 8 hours as needed

## 2022-04-14 DIAGNOSIS — R200 Anesthesia of skin: Secondary | ICD-10-CM | POA: Insufficient documentation

## 2022-04-14 MED ORDER — TRAMADOL HCL 50 MG PO TABS
50.0000 mg | ORAL_TABLET | Freq: Three times a day (TID) | ORAL | 0 refills | Status: DC | PRN
Start: 2022-04-14 — End: 2022-08-06

## 2022-04-14 NOTE — Telephone Encounter (Signed)
Prescription sent in by Dr Lacinda Axon 04/13/22

## 2022-04-14 NOTE — Assessment & Plan Note (Signed)
Likely meralgia paresthetica.  However could be secondary to lumbar radiculopathy.  Treating with gabapentin.

## 2022-04-14 NOTE — Progress Notes (Signed)
Subjective:  Patient ID: [name], female    DOB: [birth year]  Age: 71 y.o. MRN: [id number]  CC: Chief Complaint  Patient presents with   Leg Numbness    Pt arrives due to numbness, tingling and burning in left upper thigh. Right leg has a knot behind it. Pt also reports tightness in left leg. Pain when something touches left upper thigh. Has been taking Tylenol. This has been going on for a while. Also using Tramadol prn.     HPI:  71 year old female presents for evaluation the above.  Patient has a history of lumbar degenerative disc disease, rheumatoid arthritis.  She reports that she has had ongoing numbness of the left anterior to lateral thigh for the past 2 weeks.  She reports some associated tingling and discomfort.  Wakes her up at night.  Worse with lying down.  Better with activity.  She has taken Tylenol and tramadol without resolution.  No recent fall, trauma, injury.  Patient Active Problem List   Diagnosis Date Noted   Thigh numbness 04/14/2022   Anxiety 06/28/2021   GERD (gastroesophageal reflux disease) 06/28/2021   History of total knee replacement, bilateral 10/30/2018   Seropositive rheumatoid arthritis of multiple sites ([place]) 07/20/2014   Obesity (BMI 30-39.9) 12/19/2007   DDD (degenerative disc disease), lumbar 06/27/2007   Osteopenia of both forearms 06/27/2007   Essential hypertension 04/04/2007   Hyperlipidemia 02/21/2007    Social Hx   Social History   Socioeconomic History   Marital status: Married    Spouse name: Lanasia Porras   Number of children: 4   Years of education: Not on file   Highest education level: Not on file  Occupational History   Occupation: Stay at home  Tobacco Use   Smoking status: Never    Passive exposure: Never   Smokeless tobacco: Never  Vaping Use   Vaping Use: Never used  Substance and Sexual Activity   Alcohol use: Yes    Comment: sometimes; rarely   Drug use: No   Sexual activity: Yes    Birth  control/protection: Post-menopausal  Other Topics Concern   Not on file  Social History Narrative   Not on file   Social Determinants of Health   Financial Resource Strain: Low Risk  (11/08/2021)   Overall Financial Resource Strain (CARDIA)    Difficulty of Paying Living Expenses: Not very hard  Food Insecurity: No Food Insecurity (11/08/2021)   Hunger Vital Sign    Worried About Running Out of Food in the Last Year: Never true    Ran Out of Food in the Last Year: Never true  Transportation Needs: No Transportation Needs (11/08/2021)   PRAPARE - Hydrologist (Medical): No    Lack of Transportation (Non-Medical): No  Physical Activity: Sufficiently Active (11/08/2021)   Exercise Vital Sign    Days of Exercise per Week: 3 days    Minutes of Exercise per Session: 50 min  Stress: Stress Concern Present (11/08/2021)   [place]    Feeling of Stress : To some extent  Social Connections: Socially Integrated (11/08/2021)   Social Connection and Isolation Panel [NHANES]    Frequency of Communication with Friends and Family: More than three times a week    Frequency of Social Gatherings with Friends and Family: More than three times a week    Attends Religious Services: More than 4 times per year    Active  Member of Clubs or Organizations: Yes    Attends Music therapist: More than 4 times per year    Marital Status: Married    Review of Systems Per HPI  Objective:  BP 138/78   Pulse 68   Temp 97.8 F (36.6 C)   Wt 244 lb 3.2 oz (110.8 kg)   SpO2 97%   BMI 43.26 kg/m      04/13/2022    9:42 AM 04/10/2022    2:00 PM 04/04/2022   11:08 AM  BP/Weight  Systolic BP [phone]  Diastolic BP 78 71 73  Wt. (Lbs) 244.2 240 237  BMI 43.26 kg/m2 42.51 kg/m2 41.98 kg/m2    Physical Exam Constitutional:      General: She is not in acute distress.    Appearance: Normal  appearance. She is obese.  HENT:     Head: Normocephalic and atraumatic.  Cardiovascular:     Rate and Rhythm: Normal rate and regular rhythm.  Pulmonary:     Effort: Pulmonary effort is normal.     Breath sounds: Normal breath sounds.  Neurological:     Mental Status: She is alert.  Psychiatric:        Mood and Affect: Mood normal.        Behavior: Behavior normal.     Lab Results  Component Value Date   WBC 4.7 03/09/2022   HGB 14.8 03/09/2022   HCT 43.7 03/09/2022   PLT 178 03/09/2022   GLUCOSE 96 03/09/2022   CHOL 148 11/14/2021   TRIG 33 11/14/2021   HDL 58 11/14/2021   LDLCALC 82 11/14/2021   ALT 17 03/09/2022   AST 22 03/09/2022   NA 141 03/09/2022   K 4.3 03/09/2022   CL 104 03/09/2022   CREATININE 0.89 03/09/2022   BUN 14 03/09/2022   CO2 25 03/09/2022   TSH 2.53 03/17/2020   INR 1.75 (H) 03/03/2010   HGBA1C 5.4 02/27/2019     Assessment & Plan:   Problem List Items Addressed This Visit       Other   Thigh numbness - Primary    Likely meralgia paresthetica.  However could be secondary to lumbar radiculopathy.  Treating with gabapentin.      Relevant Orders   DG Lumbar Spine Complete (Completed)    Meds ordered this encounter  Medications   gabapentin (NEURONTIN) 300 MG capsule    Sig: Take 1 capsule (300 mg total) by mouth 3 (three) times daily.    Dispense:  90 capsule    Refill:  0   traMADol (ULTRAM) 50 MG tablet    Sig: Take 1-2 tablets (50-100 mg total) by mouth every 8 (eight) hours as needed for moderate pain or severe pain.    Dispense:  30 tablet    Refill:  [place]

## 2022-04-17 NOTE — Progress Notes (Signed)
Left message for patient to return the call for additional details and recommendations.   

## 2022-04-20 NOTE — Progress Notes (Signed)
Chief Complaint:   OBESITY [name] is here to discuss her progress with her obesity treatment plan along with follow-up of her obesity related diagnoses. [name] is on keeping a food journal and adhering to recommended goals of 1350-1450 calories and 85+ grams of protein and states she is following her eating plan approximately 60% of the time. [name] states she is doing exercise classes 50 minutes 2 times per week.  Today's visit was #: 89 Starting weight: 267 lbs Starting date: 10/31/2018 Today's weight: 240 lbs Today's date: 04/10/2022 Total lbs lost to date: 27 lbs Total lbs lost since last in-office visit: 0  Interim History: [name] went on vacation since last appointment and unfortunately she got COVID. She thankfully did get to spend time with her grandkids. Her garden has gone really well. Experiencing some pins and needles feelings on her left outer thigh. Feels she needs a restart. Wants a program that is less protein based--wants more nuts, fruit, vegetables. Wants to get farther from meat protein.  Subjective:   1. Vitamin D deficiency [name] is on over the counter Vit D. Notes fatigue.  2. Other hyperlipidemia [name] is on Lipitor and Zetia. No changes in medications recently with no myalgias or transaminitis.  Assessment/Plan:   1. Vitamin D deficiency [name] will continue with current over the counter Vitamin D--will need repeat labs at next appointment.  2. Other hyperlipidemia Labs per PCP.  3. Obesity with current BMI of 42.6 [name] is currently in the action stage of change. As such, her goal is to continue with weight loss efforts. She has agreed to keeping a food journal and adhering to recommended goals of 1350-1450 calories and 90 grams of protein.   Exercise goals: All adults should avoid inactivity. Some physical activity is better than none, and adults who participate in any amount of physical activity gain some health benefits.  Behavioral  modification strategies: increasing lean protein intake, meal planning and cooking strategies, keeping healthy foods in the home, and planning for success.  [name] has agreed to follow-up with our clinic in 8 weeks. She was informed of the importance of frequent follow-up visits to maximize her success with intensive lifestyle modifications for her multiple health conditions.   Objective:   Blood pressure 125/71, pulse 71, temperature 98.3 F (36.8 C), height '5\' 3"'$  (1.6 m), weight 240 lb (108.9 kg), SpO2 96 %. Body mass index is 42.51 kg/m.  General: Cooperative, alert, well developed, in no acute distress. HEENT: Conjunctivae and lids unremarkable. Cardiovascular: Regular rhythm.  Lungs: Normal work of breathing. Neurologic: No focal deficits.   Lab Results  Component Value Date   CREATININE 0.89 03/09/2022   BUN 14 03/09/2022   NA 141 03/09/2022   K 4.3 03/09/2022   CL 104 03/09/2022   CO2 25 03/09/2022   Lab Results  Component Value Date   ALT 17 03/09/2022   AST 22 03/09/2022   ALKPHOS 69 03/09/2022   BILITOT 0.6 03/09/2022   Lab Results  Component Value Date   HGBA1C 5.4 02/27/2019   HGBA1C 5.5 10/31/2018   Lab Results  Component Value Date   INSULIN 8.3 02/27/2019   INSULIN 12.3 10/31/2018   Lab Results  Component Value Date   TSH 2.53 03/17/2020   Lab Results  Component Value Date   CHOL 148 11/14/2021   HDL 58 11/14/2021   LDLCALC 82 11/14/2021   TRIG 33 11/14/2021   CHOLHDL 2.6 11/14/2021   Lab Results  Component Value Date  VD25OH 32.7 09/29/2020   VD25OH 49.1 03/09/2020   VD25OH 41.6 02/27/2019   Lab Results  Component Value Date   WBC 4.7 03/09/2022   HGB 14.8 03/09/2022   HCT 43.7 03/09/2022   MCV 92 03/09/2022   PLT 178 03/09/2022   No results found for: "IRON", "TIBC", "FERRITIN"  Attestation Statements:   Reviewed by clinician on day of visit: allergies, medications, problem list, medical history, surgical history, family  history, social history, and previous encounter notes.  I, Elnora Morrison, RMA am acting as transcriptionist for Coralie Common, MD.  I have reviewed the above documentation for accuracy and completeness, and I agree with the above. - Coralie Common, MD

## 2022-04-25 NOTE — Progress Notes (Unsigned)
Office Visit Note  Patient: [name]             Date of Birth: 1951/03/20           MRN: [id number]             PCP: Coral Spikes, DO Referring: Coral Spikes, DO Visit Date: 05/09/2022 Occupation: '@GUAROCC'$ @  Subjective:  Recurrent infections   History of Present Illness: PORSCHEA BORYS is a 71 y.o. female with history of seropositive rheumatoid arthritis and DDD.  Patient remains on Enbrel 50 mg sq injections once weekly.  She is tolerating enbrel without any side effects.  She reports in the past 6 months she has been experiencing recurrent infections including a UTI, Covid-19, and skin infections.  She is not currently taking any antibiotics, antiviral, and antifungal.  She has no infection currently. She denies any recent rheumatoid arthritis flares. She states both knee replacements are doing well overall.  She continues to experience intermittent discomfort in her lower back.  She uses a cane to assist with ambulation.  She continues to experience morning stiffness lasting about 1 hour daily.      Activities of Daily Living:  Patient reports morning stiffness for 1 hour.   Patient Reports nocturnal pain.  Difficulty dressing/grooming: Denies Difficulty climbing stairs: Denies Difficulty getting out of chair: Denies Difficulty using hands for taps, buttons, cutlery, and/or writing: Reports  Review of Systems  Constitutional:  Positive for fatigue.  HENT:  Positive for mouth dryness. Negative for mouth sores.   Eyes:  Positive for dryness.  Respiratory:  Negative for shortness of breath.   Cardiovascular:  Negative for chest pain and palpitations.  Gastrointestinal:  Negative for blood in stool, constipation and diarrhea.  Endocrine: Negative for increased urination.  Genitourinary:  Negative for involuntary urination.  Musculoskeletal:  Positive for myalgias, muscle weakness, morning stiffness, muscle tenderness and myalgias. Negative for joint pain, gait  problem, joint pain and joint swelling.  Skin:  Negative for color change, rash, hair loss and sensitivity to sunlight.  Allergic/Immunologic: Positive for susceptible to infections.  Neurological:  Positive for dizziness and headaches.  Hematological:  Negative for swollen glands.  Psychiatric/Behavioral:  Positive for sleep disturbance. Negative for depressed mood. The patient is nervous/anxious.     PMFS History:  Patient Active Problem List   Diagnosis Date Noted   Thigh numbness 04/14/2022   Anxiety 06/28/2021   GERD (gastroesophageal reflux disease) 06/28/2021   History of total knee replacement, bilateral 10/30/2018   Seropositive rheumatoid arthritis of multiple sites ([place]) 07/20/2014   Obesity (BMI 30-39.9) 12/19/2007   DDD (degenerative disc disease), lumbar 06/27/2007   Osteopenia of both forearms 06/27/2007   Essential hypertension 04/04/2007   Hyperlipidemia 02/21/2007    Past Medical History:  Diagnosis Date   Allergic rhinitis    Allergy    Seasonal. Fall   Anxiety    Arthritis    Asthma    Constipation    Depression    Gallbladder problem    Hematuria 08/25/2015   Hyperlipidemia    Hypertension    Impetigo    in nose per patient, dx by derm.   Obesity    Osteoarthritis    Pelvic pain in female 08/25/2015   Rheumatoid arthritis (HCC)    Swallowing difficulty    Swelling     Family History  Problem Relation Age of Onset   Heart attack Father    Alcoholism Father    Depression Father  Heart disease Father    Hyperlipidemia Father    Hypertension Father    Leukemia Mother    Hypertension Mother    Cancer Mother    Depression Mother    Early death Mother    Miscarriages / [place] Mother    Obesity Sister    Other Sister        breathing problems   Hepatitis C Sister    Arthritis Sister    Depression Sister    Colon cancer Sister    Squamous cell carcinoma Sister    Emphysema Brother    Obesity Daughter    Polycystic ovary syndrome  Daughter    Depression Daughter    Obesity Son    Other Son        knee problems   Cancer Maternal Grandmother        breast   Other Brother        aneursym   Arthritis Sister    Depression Sister    Squamous cell carcinoma Sister    Obesity Son    Asthma Son    Depression Son    Obesity Son    Diabetes Other        runs on dad's side of the family   Arthritis Sister    Cancer Sister    Depression Sister    Obesity Sister    Cancer Sister    Obesity Sister    Depression Son    Obesity Son    Depression Son    Obesity Son    Vision loss Maternal Aunt    Past Surgical History:  Procedure Laterality Date   BACK SURGERY     CHOLECYSTECTOMY     COLONOSCOPY  2007   sessile sigmoid polyp x 1; path: serrated adenoma   COLONOSCOPY N/A 10/12/2014   Procedure: COLONOSCOPY;  Surgeon: [name], MD;  [facility];  Service: Endoscopy;  Laterality: N/A;   COLONOSCOPY N/A 03/05/2020   Procedure: COLONOSCOPY;  Surgeon: Daneil Dolin, MD;  Location: [facility];  Service: Endoscopy;  Laterality: N/A;  9:30   EYE SURGERY  2019   Cataract   JOINT REPLACEMENT     BIL knee    KNEE ARTHROPLASTY     KNEE SURGERY     bilateral knee replacement   LIPOMA EXCISION     x 2   SPINE SURGERY  2008   Rod L4-L5   TONSILLECTOMY AND ADENOIDECTOMY     Social History   Social History Narrative   Not on file   Immunization History  Administered Date(s) Administered   Fluad Quad(high Dose 65+) 05/27/2020   Influenza Split 06/17/2013   Influenza Whole 06/27/2007, 05/08/2008   Influenza, High Dose Seasonal PF 06/14/2017, 06/18/2018   Influenza,inj,Quad PF,6+ Mos 07/12/2015   Influenza-Unspecified 06/14/2017, 06/18/2018, 05/11/2019, 05/14/2021   PFIZER(Purple Top)SARS-COV-2 Vaccination 10/12/2019, 11/04/2019, 06/03/2020, 05/23/2021   PNEUMOCOCCAL CONJUGATE-20 12/26/2021   Pneumococcal Polysaccharide-23 02/18/1993, 06/27/2007   Zoster Recombinat (Shingrix) 08/19/2018      Objective: Vital Signs: BP 112/76 (BP Location: Left Arm, Patient Position: Sitting, Cuff Size: Large)   Pulse 69   Resp 15   Ht '5\' 3"'$  (1.6 m)   Wt 243 lb 12.8 oz (110.6 kg)   BMI 43.19 kg/m    Physical Exam Vitals and nursing note reviewed.  Constitutional:      Appearance: She is well-developed.  HENT:     Head: Normocephalic and atraumatic.  Eyes:     Conjunctiva/sclera:  Conjunctivae normal.  Cardiovascular:     Rate and Rhythm: Normal rate and regular rhythm.     Heart sounds: Murmur heard.  Pulmonary:     Effort: Pulmonary effort is normal.     Breath sounds: Normal breath sounds.  Abdominal:     General: Bowel sounds are normal.     Palpations: Abdomen is soft.  Musculoskeletal:     Cervical back: Normal range of motion.  Skin:    General: Skin is warm and dry.     Capillary Refill: Capillary refill takes less than 2 seconds.  Neurological:     Mental Status: She is alert and oriented to person, place, and time.  Psychiatric:        Behavior: Behavior normal.      Musculoskeletal Exam: C-spine has good ROM.  Postural thoracic kyphosis.  Painful ROM of lumbar spine.  Discomfort and stiffness with range of motion of both shoulder joints.  Synovial thickening of MCP joints with ulnar deviation.  PIP and DIP thickening consistent with osteoarthritis of both hands.  Hip joints have good range of motion with no groin pain.  Bilateral knee replacements have good range of motion.  Ankle joints have good range of motion with no joint tenderness.  Pedal edema noted bilaterally.  CDAI Exam: CDAI Score: -- Patient Global: --; Provider Global: -- Swollen: --; Tender: -- Joint Exam 05/09/2022   No joint exam has been documented for this visit   There is currently no information documented on the homunculus. Go to the Rheumatology activity and complete the homunculus joint exam.  Investigation: No additional findings.  Imaging: DG Lumbar Spine Complete  Result Date:  04/13/2022 CLINICAL DATA:  Tingling and numbness in thighs, lower back pain EXAM: LUMBAR SPINE - COMPLETE 4+ VIEW COMPARISON:  04/06/2017 FINDINGS: Osseous demineralization. Five non-rib bearing lumbar vertebra. Posterior fusion L4-L5. Disk space narrowing L3-L4. Minimal retrolisthesis L2-L3. No fracture, subluxation, or bone destruction. No spondylolysis. SI joints preserved. IMPRESSION: Degenerative and postsurgical changes of lumbar spine with osseous demineralization as above. No acute abnormalities. Electronically Signed   By: Lavonia Dana M.D.   On: 04/13/2022 10:55    Recent Labs: Lab Results  Component Value Date   WBC 4.7 03/09/2022   HGB 14.8 03/09/2022   PLT 178 03/09/2022   NA 141 03/09/2022   K 4.3 03/09/2022   CL 104 03/09/2022   CO2 25 03/09/2022   GLUCOSE 96 03/09/2022   BUN 14 03/09/2022   CREATININE 0.89 03/09/2022   BILITOT 0.6 03/09/2022   ALKPHOS 69 03/09/2022   AST 22 03/09/2022   ALT 17 03/09/2022   PROT 6.5 03/09/2022   ALBUMIN 4.2 03/09/2022   CALCIUM 10.4 (H) 03/09/2022   GFRAA 73 07/23/2020   QFTBGOLDPLUS Negative 03/09/2022    Speciality Comments: PLQ Eye Exam: 12/03/2019 WNL @ [facility] Follow up in 1 year MTX -dcd 09/19 Enbrel start date 03/10/20  Procedures:  No procedures performed Allergies: Codeine, Cyclosporine, and Propylene glycol   Assessment / Plan:     Visit Diagnoses: Seropositive rheumatoid arthritis of multiple sites ([place]) - positive RF, positive anti-CCP. Previous patient of Dr. Charlestine Night with multiple contractures, ultrasound of both hands on 10/30/18 revealed synovitis: She has no joint tenderness or synovitis on examination today.  She has not had any signs or symptoms of a rheumatoid arthritis flare.  She has clinically been doing well on Enbrel 50 mg sq injections every week as monotherapy.  X-rays of both hands and feet  were obtained on 07/04/2021 and did not reveal any radiographic progression compared to x-rays from 2018.  Over  the past 6 months he has been experiencing recurrent flares including a UTI, COVID-19, and recurrent skin infections.  She is not currently experiencing an active infection and is not taking any antibiotics, antivirals, or antifungals.  Discussed that she can try spacing Enbrel dosing to every 10 days to try to minimize the risk for immunosuppression.  She was in agreement.  She was advised to notify us if she develops increased joint pain or joint swelling while spacing the dose.  She will follow-up in the office in 5 months or sooner if needed.  - Plan: Etanercept (ENBREL MINI) 50 MG/ML SOCT  High risk medication use - Enbrel 50 mg sq injections once every 10 days-started July 2021. D/c MTX and PLQ.  Patient plans on trying to space Enbrel to 50 mg every 10 days.  She has been experiencing recurrent infections over the past 6 months. CBC and CMP updated on 03/09/2022.  She will be due to update lab work in october and every 3 months to monitor for drug toxicity.  Standing orders for CBC and CMP remain in place. TB Gold negative on 03/09/2022 and will continue to be monitored yearly. Discussed the importance of holding Enbrel if she develops signs or symptoms of an infection and to resume once the infection has completely cleared. Discussed the importance of yearly skin cancer screening while on Enbrel.  Chronic left shoulder pain: She continues to experience intermittent pain and stiffness in both shoulder joints.  Chronic right shoulder pain: She has some tenderness over the right shoulder joint on examination today.  Discomfort and stiffness with range of motion noted.  History of total knee replacement, bilateral: Overall doing well.  Good range of motion with no discomfort.  She is using a cane to assist with ambulation.  Trochanteric bursitis of right hip - Resolved.   DDD (degenerative disc disease), lumbar - S/p fusion: Chronic pain.  Discomfort with range of motion.  Using a cane to assist  with ambulation.  She plans on following up with her spine specialist.  Osteopenia of multiple sites - DEXA 01/12/20: The BMD measured at Forearm Radius 33% is 0.555 g/cm2 with a T-score of -2.2.  Patient was encouraged to have an updated bone density. She has been taking vitamin D 5000 units daily.   Other medical conditions are listed as follows:  History of hyperlipidemia  Essential hypertension: BP was 112/76 today in the office.   History of asthma  History of adenomatous polyp of colon  History of depression  History of MRSA infection  History of anxiety  Orders: No orders of the defined types were placed in this encounter.  Meds ordered this encounter  Medications   Etanercept (ENBREL MINI) 50 MG/ML SOCT    Sig: Inject 50 mg into the skin once a week.    Dispense:  12 mL    Refill:  0    Follow-Up Instructions: Return in about 5 months (around 10/09/2022) for Rheumatoid arthritis, DDD.   Ofilia Neas, PA-C  Note - This record has been created using Dragon software.  Chart creation errors have been sought, but may not always  have been located. Such creation errors do not reflect on  the standard of medical care.

## 2022-05-09 ENCOUNTER — Encounter: Payer: Self-pay | Admitting: Physician Assistant

## 2022-05-09 ENCOUNTER — Ambulatory Visit: Payer: PPO | Attending: Physician Assistant | Admitting: Physician Assistant

## 2022-05-09 VITALS — BP 112/76 | HR 69 | Resp 15 | Ht 63.0 in | Wt 243.8 lb

## 2022-05-09 DIAGNOSIS — G8929 Other chronic pain: Secondary | ICD-10-CM

## 2022-05-09 DIAGNOSIS — M5136 Other intervertebral disc degeneration, lumbar region: Secondary | ICD-10-CM | POA: Diagnosis not present

## 2022-05-09 DIAGNOSIS — Z8639 Personal history of other endocrine, nutritional and metabolic disease: Secondary | ICD-10-CM

## 2022-05-09 DIAGNOSIS — Z860101 Personal history of adenomatous and serrated colon polyps: Secondary | ICD-10-CM

## 2022-05-09 DIAGNOSIS — Z96653 Presence of artificial knee joint, bilateral: Secondary | ICD-10-CM | POA: Diagnosis not present

## 2022-05-09 DIAGNOSIS — Z86010 Personal history of colonic polyps: Secondary | ICD-10-CM | POA: Diagnosis not present

## 2022-05-09 DIAGNOSIS — I10 Essential (primary) hypertension: Secondary | ICD-10-CM

## 2022-05-09 DIAGNOSIS — M7061 Trochanteric bursitis, right hip: Secondary | ICD-10-CM

## 2022-05-09 DIAGNOSIS — Z8659 Personal history of other mental and behavioral disorders: Secondary | ICD-10-CM

## 2022-05-09 DIAGNOSIS — M25512 Pain in left shoulder: Secondary | ICD-10-CM | POA: Diagnosis not present

## 2022-05-09 DIAGNOSIS — Z8709 Personal history of other diseases of the respiratory system: Secondary | ICD-10-CM

## 2022-05-09 DIAGNOSIS — Z79899 Other long term (current) drug therapy: Secondary | ICD-10-CM | POA: Diagnosis not present

## 2022-05-09 DIAGNOSIS — M25511 Pain in right shoulder: Secondary | ICD-10-CM

## 2022-05-09 DIAGNOSIS — M8589 Other specified disorders of bone density and structure, multiple sites: Secondary | ICD-10-CM | POA: Diagnosis not present

## 2022-05-09 DIAGNOSIS — M0579 Rheumatoid arthritis with rheumatoid factor of multiple sites without organ or systems involvement: Secondary | ICD-10-CM

## 2022-05-09 DIAGNOSIS — M51369 Other intervertebral disc degeneration, lumbar region without mention of lumbar back pain or lower extremity pain: Secondary | ICD-10-CM

## 2022-05-09 DIAGNOSIS — Z8614 Personal history of Methicillin resistant Staphylococcus aureus infection: Secondary | ICD-10-CM

## 2022-05-09 MED ORDER — ENBREL MINI 50 MG/ML ~~LOC~~ SOCT: 50.0000 mg | SUBCUTANEOUS | 0 refills | Status: DC

## 2022-05-09 NOTE — Patient Instructions (Signed)
Standing Labs We placed an order today for your standing lab work.   Please have your standing labs drawn in October and every 3 months   If possible, please have your labs drawn 2 weeks prior to your appointment so that the provider can discuss your results at your appointment.  Please note that you may see your imaging and lab results in [place] before we have reviewed them. We may be awaiting multiple results to interpret others before contacting you. Please allow our office up to 72 hours to thoroughly review all of the results before contacting the office for clarification of your results.  We currently have open lab daily: Monday through Thursday from 1:30 PM-4:30 PM and Friday from 1:30 PM- 4:00 PM If possible, please come for your lab work on Monday, Thursday or Friday afternoons, as you may experience shorter wait times.   Effective June 21, 2022 the new lab hours will change to: Monday through Thursday from 1:30 PM-5:00 PM and Friday from 8:30 AM-12:00 PM If possible, please come for your lab work on Monday and Thursday afternoons, as you may experience shorter wait times.  Please be advised, all patients with office appointments requiring lab work will take precedent over walk-in lab work.    The office is located at [address] [place], [place], [place] [address] No appointment is necessary.   Labs are drawn by [facility]. Please bring your co-pay at the time of your lab draw.  You may receive a bill from [facility] for your lab work.  Please note if you are on Hydroxychloroquine and and an order has been placed for a Hydroxychloroquine level, you will need to have it drawn 4 hours or more after your last dose.  If you wish to have your labs drawn at another location, please call the office 24 hours in advance to send orders.  If you have any questions regarding directions or hours of operation,  please call [phone].   As a reminder, please drink plenty of water prior  to coming for your lab work. Thanks!

## 2022-05-22 ENCOUNTER — Other Ambulatory Visit: Payer: Self-pay | Admitting: Family Medicine

## 2022-05-22 ENCOUNTER — Encounter: Payer: Self-pay | Admitting: Family Medicine

## 2022-05-22 MED ORDER — GABAPENTIN 300 MG PO CAPS: 300.0000 mg | ORAL_CAPSULE | Freq: Three times a day (TID) | ORAL | 1 refills | Status: DC

## 2022-05-23 ENCOUNTER — Other Ambulatory Visit: Payer: Self-pay | Admitting: Family Medicine

## 2022-05-23 ENCOUNTER — Ambulatory Visit (HOSPITAL_COMMUNITY)
Admission: RE | Admit: 2022-05-23 | Discharge: 2022-05-23 | Disposition: A | Discharge status: Home / Self Care | Payer: PPO | Source: Ambulatory Visit | Attending: Family Medicine | Admitting: Family Medicine

## 2022-05-23 DIAGNOSIS — Z780 Asymptomatic menopausal state: Secondary | ICD-10-CM | POA: Diagnosis not present

## 2022-05-23 DIAGNOSIS — M810 Age-related osteoporosis without current pathological fracture: Secondary | ICD-10-CM | POA: Diagnosis not present

## 2022-05-23 MED ORDER — ALENDRONATE SODIUM 70 MG PO TABS: 70.0000 mg | ORAL_TABLET | ORAL | 11 refills | Status: DC

## 2022-05-29 ENCOUNTER — Ambulatory Visit (INDEPENDENT_AMBULATORY_CARE_PROVIDER_SITE_OTHER): Payer: PPO | Admitting: Family Medicine

## 2022-05-29 ENCOUNTER — Encounter (INDEPENDENT_AMBULATORY_CARE_PROVIDER_SITE_OTHER): Payer: Self-pay | Admitting: Family Medicine

## 2022-05-29 VITALS — BP 129/80 | HR 65 | Temp 98.1°F | Ht 63.0 in | Wt 243.0 lb

## 2022-05-29 DIAGNOSIS — R0602 Shortness of breath: Secondary | ICD-10-CM | POA: Diagnosis not present

## 2022-05-29 DIAGNOSIS — M810 Age-related osteoporosis without current pathological fracture: Secondary | ICD-10-CM | POA: Diagnosis not present

## 2022-05-29 DIAGNOSIS — Z6841 Body Mass Index (BMI) 40.0 and over, adult: Secondary | ICD-10-CM | POA: Diagnosis not present

## 2022-05-29 DIAGNOSIS — E669 Obesity, unspecified: Secondary | ICD-10-CM

## 2022-05-30 NOTE — Progress Notes (Signed)
Chief Complaint:   OBESITY [name] is here to discuss her progress with her obesity treatment plan along with follow-up of her obesity related diagnoses. [name] is on keeping a food journal and adhering to recommended goals of 1350-1450 calories and 90 grams of protein and states she is following her eating plan approximately 100% of the time. [name] states she is exercising/gardening 60/60 minutes 2/5 times per week.  Today's visit was #: 9 Starting weight: 267 lbs Starting date: 10/31/2018 Today's weight: 243 lbs Today's date: 05/29/2022 Total lbs lost to date: 24 lbs Total lbs lost since last in-office visit: 0  Interim History: [name] has had quite a bit of medical things happen since last appointment. She is still experiencing thigh pain particularly in left posterior lateral line. Getting around 1300 calories daily and --60 grams of protein daily.  Subjective:   1. SOBOE (shortness of breath on exertion) Previously IC was 1784. She has slightly increased symptoms since IC from March 2022.  2. Age-related osteoporosis without current pathological fracture Recent Dexa scan showing osteoporosis told to increase Ca and dairy. History of RA on biologic agent.  Assessment/Plan:   1. SOBOE (shortness of breath on exertion) IC today of 1454.  2. Age-related osteoporosis without current pathological fracture Refer to [employer].  - Ambulatory referral to [facility]  3. Obesity with current BMI of 43.0 [name] is currently in the action stage of change. As such, her goal is to continue with weight loss efforts. She has agreed to keeping a food journal and adhering to recommended goals of 1350-1450 calories and 90 grams of protein.   Exercise goals: All adults should avoid inactivity. Some physical activity is better than none, and adults who participate in any amount of physical activity gain some health benefits.  [name] encouraged to  add more consistent resistance training 4x a week.  Behavioral modification strategies: increasing lean protein intake, meal planning and cooking strategies, keeping healthy foods in the home, planning for success, and keeping a strict food journal.  [name] has agreed to follow-up with our clinic in 8 weeks. She was informed of the importance of frequent follow-up visits to maximize her success with intensive lifestyle modifications for her multiple health conditions.   Objective:   Blood pressure 129/80, pulse 65, temperature 98.1 F (36.7 C), height '5\' 3"'$  (1.6 m), weight 243 lb (110.2 kg), SpO2 98 %. Body mass index is 43.05 kg/m.  General: Cooperative, alert, well developed, in no acute distress. HEENT: Conjunctivae and lids unremarkable. Cardiovascular: Regular rhythm.  Lungs: Normal work of breathing. Neurologic: No focal deficits.   Lab Results  Component Value Date   CREATININE 0.89 03/09/2022   BUN 14 03/09/2022   NA 141 03/09/2022   K 4.3 03/09/2022   CL 104 03/09/2022   CO2 25 03/09/2022   Lab Results  Component Value Date   ALT 17 03/09/2022   AST 22 03/09/2022   ALKPHOS 69 03/09/2022   BILITOT 0.6 03/09/2022   Lab Results  Component Value Date   HGBA1C 5.4 02/27/2019   HGBA1C 5.5 10/31/2018   Lab Results  Component Value Date   INSULIN 8.3 02/27/2019   INSULIN 12.3 10/31/2018   Lab Results  Component Value Date   TSH 2.53 03/17/2020   Lab Results  Component Value Date   CHOL 148 11/14/2021   HDL 58 11/14/2021   LDLCALC 82 11/14/2021   TRIG 33 11/14/2021   CHOLHDL 2.6 11/14/2021  Lab Results  Component Value Date   VD25OH 32.7 09/29/2020   VD25OH 49.1 03/09/2020   VD25OH 41.6 02/27/2019   Lab Results  Component Value Date   WBC 4.7 03/09/2022   HGB 14.8 03/09/2022   HCT 43.7 03/09/2022   MCV 92 03/09/2022   PLT 178 03/09/2022   No results found for: "IRON", "TIBC", "FERRITIN"  Obesity Behavioral Intervention:   Approximately 15  minutes were spent on the discussion below.  ASK: We discussed the diagnosis of obesity with Curt Bears today and Kaytlin agreed to give [place] permission to discuss obesity behavioral modification therapy today.  ASSESS: [name] has the diagnosis of obesity and her BMI today is 43.0. [name] is in the action stage of change.   ADVISE: [name] was educated on the multiple health risks of obesity as well as the benefit of weight loss to improve her health. She was advised of the need for long term treatment and the importance of lifestyle modifications to improve her current health and to decrease her risk of future health problems.  AGREE: Multiple dietary modification options and treatment options were discussed and [name] agreed to follow the recommendations documented in the above note.  ARRANGE: [name] was educated on the importance of frequent visits to treat obesity as outlined per CMS and USPSTF guidelines and agreed to schedule her next follow up appointment today.  Attestation Statements:   Reviewed by clinician on day of visit: allergies, medications, problem list, medical history, surgical history, family history, social history, and previous encounter notes.  I, Elnora Morrison, RMA am acting as transcriptionist for Coralie Common, MD.  I have reviewed the above documentation for accuracy and completeness, and I agree with the above. - Coralie Common, MD

## 2022-06-20 DIAGNOSIS — M810 Age-related osteoporosis without current pathological fracture: Secondary | ICD-10-CM | POA: Diagnosis not present

## 2022-06-23 ENCOUNTER — Other Ambulatory Visit: Payer: Self-pay | Admitting: Family Medicine

## 2022-06-23 DIAGNOSIS — F419 Anxiety disorder, unspecified: Secondary | ICD-10-CM

## 2022-06-23 DIAGNOSIS — E7849 Other hyperlipidemia: Secondary | ICD-10-CM

## 2022-06-26 DIAGNOSIS — X32XXXD Exposure to sunlight, subsequent encounter: Secondary | ICD-10-CM | POA: Diagnosis not present

## 2022-06-26 DIAGNOSIS — L570 Actinic keratosis: Secondary | ICD-10-CM | POA: Diagnosis not present

## 2022-06-26 DIAGNOSIS — D225 Melanocytic nevi of trunk: Secondary | ICD-10-CM | POA: Diagnosis not present

## 2022-06-26 DIAGNOSIS — Z1283 Encounter for screening for malignant neoplasm of skin: Secondary | ICD-10-CM | POA: Diagnosis not present

## 2022-06-28 ENCOUNTER — Ambulatory Visit (INDEPENDENT_AMBULATORY_CARE_PROVIDER_SITE_OTHER): Payer: PPO | Admitting: Family Medicine

## 2022-06-28 ENCOUNTER — Encounter: Payer: Self-pay | Admitting: Family Medicine

## 2022-06-28 VITALS — BP 138/80 | HR 66 | Temp 97.5°F | Ht 63.0 in | Wt 246.0 lb

## 2022-06-28 DIAGNOSIS — K219 Gastro-esophageal reflux disease without esophagitis: Secondary | ICD-10-CM

## 2022-06-28 DIAGNOSIS — R200 Anesthesia of skin: Secondary | ICD-10-CM | POA: Diagnosis not present

## 2022-06-28 DIAGNOSIS — M810 Age-related osteoporosis without current pathological fracture: Secondary | ICD-10-CM | POA: Insufficient documentation

## 2022-06-28 DIAGNOSIS — E7849 Other hyperlipidemia: Secondary | ICD-10-CM

## 2022-06-28 DIAGNOSIS — I350 Nonrheumatic aortic (valve) stenosis: Secondary | ICD-10-CM

## 2022-06-28 DIAGNOSIS — M8580 Other specified disorders of bone density and structure, unspecified site: Secondary | ICD-10-CM | POA: Insufficient documentation

## 2022-06-28 HISTORY — DX: Age-related osteoporosis without current pathological fracture: M81.0

## 2022-06-28 MED ORDER — FAMOTIDINE 40 MG PO TABS: 40.0000 mg | ORAL_TABLET | Freq: Every day | ORAL | 3 refills | Status: DC | PRN

## 2022-06-28 MED ORDER — ATORVASTATIN CALCIUM 80 MG PO TABS: 80.0000 mg | ORAL_TABLET | Freq: Every day | ORAL | 3 refills | Status: DC

## 2022-06-28 MED ORDER — EZETIMIBE 10 MG PO TABS: 10.0000 mg | ORAL_TABLET | Freq: Every day | ORAL | 3 refills | Status: DC

## 2022-06-28 NOTE — Patient Instructions (Signed)
Continue your medications.  Follow up in 6 months.  Call or message with concerns.  Take care  Dr. Lacinda Axon

## 2022-06-29 ENCOUNTER — Encounter: Payer: Self-pay | Admitting: Family Medicine

## 2022-06-29 DIAGNOSIS — I350 Nonrheumatic aortic (valve) stenosis: Secondary | ICD-10-CM | POA: Insufficient documentation

## 2022-06-29 NOTE — Assessment & Plan Note (Signed)
Well-controlled on atorvastatin and Zetia.  Continue.  Refilled today.

## 2022-06-29 NOTE — Progress Notes (Signed)
Subjective:  Patient ID: [name], female    DOB: 1950-09-20  Age: 71 y.o. MRN: [id number]  CC: Chief Complaint  Patient presents with   6 month follow up    Osteoporosis fosomax and referral to nutritionist, talk about lowering dose of cholesterol med Pain and tingling in left thigh    HPI:  71 year old female with GERD, degenerative disc disease, osteoporosis, RA followed by rheumatology, anxiety, hyperlipidemia, obesity, presents for follow-up.  Patient is on Fosamax for osteoporosis.  She would like to see a nutritionist to help with her calcium intake to aid in her osteoporosis.  She would also benefit from seeing a nutritionist due to obesity.  Patient's lipids well controlled on Zetia and atorvastatin.  Needs refills.  GERD stable on Pepcid.  Needs refill.  Patient continues to be bothered by left thigh numbness likely secondary to meralgia paresthetica.  Is improved but still troublesome.  Patient Active Problem List   Diagnosis Date Noted   Mild aortic stenosis 06/29/2022   Osteoporosis 06/28/2022   Thigh numbness 04/14/2022   Anxiety 06/28/2021   GERD (gastroesophageal reflux disease) 06/28/2021   History of total knee replacement, bilateral 10/30/2018   Seropositive rheumatoid arthritis of multiple sites ([place]) 07/20/2014   Obesity (BMI 30-39.9) 12/19/2007   DDD (degenerative disc disease), lumbar 06/27/2007   Hyperlipidemia 02/21/2007    Social Hx   Social History   Socioeconomic History   Marital status: Married    Spouse name: Tyjanae Bartek   Number of children: 4   Years of education: Not on file   Highest education level: Not on file  Occupational History   Occupation: Stay at home  Tobacco Use   Smoking status: Never    Passive exposure: Never   Smokeless tobacco: Never  Vaping Use   Vaping Use: Never used  Substance and Sexual Activity   Alcohol use: Yes    Comment: sometimes; rarely   Drug use: No   Sexual activity: Yes    Birth  control/protection: Post-menopausal  Other Topics Concern   Not on file  Social History Narrative   Not on file   Social Determinants of Health   Financial Resource Strain: Low Risk  (11/08/2021)   Overall Financial Resource Strain (CARDIA)    Difficulty of Paying Living Expenses: Not very hard  Food Insecurity: No Food Insecurity (11/08/2021)   Hunger Vital Sign    Worried About Running Out of Food in the Last Year: Never true    Ran Out of Food in the Last Year: Never true  Transportation Needs: No Transportation Needs (11/08/2021)   PRAPARE - Hydrologist (Medical): No    Lack of Transportation (Non-Medical): No  Physical Activity: Sufficiently Active (11/08/2021)   Exercise Vital Sign    Days of Exercise per Week: 3 days    Minutes of Exercise per Session: 50 min  Stress: Stress Concern Present (11/08/2021)   [place]    Feeling of Stress : To some extent  Social Connections: Socially Integrated (11/08/2021)   Social Connection and Isolation Panel [NHANES]    Frequency of Communication with Friends and Family: More than three times a week    Frequency of Social Gatherings with Friends and Family: More than three times a week    Attends Religious Services: More than 4 times per year    Active Member of [employer] or Organizations: Yes    Attends Club or  Organization Meetings: More than 4 times per year    Marital Status: Married    Review of Systems Per HPI  Objective:  BP 138/80   Pulse 66   Temp (!) 97.5 F (36.4 C)   Ht '5\' 3"'$  (1.6 m)   Wt 246 lb (111.6 kg)   SpO2 96%   BMI 43.58 kg/m      06/28/2022    8:33 AM 05/29/2022    7:00 AM 05/09/2022   12:50 PM  BP/Weight  Systolic BP 258 527 782  Diastolic BP 80 80 76  Wt. (Lbs) 246 243 243.8  BMI 43.58 kg/m2 43.05 kg/m2 43.19 kg/m2    Physical Exam Vitals and nursing note reviewed.  Constitutional:      Appearance: Normal  appearance. She is obese.  HENT:     Head: Normocephalic and atraumatic.  Eyes:     General:        Right eye: No discharge.        Left eye: No discharge.     Conjunctiva/sclera: Conjunctivae normal.  Cardiovascular:     Rate and Rhythm: Normal rate and regular rhythm.     Heart sounds: Murmur heard.  Pulmonary:     Effort: Pulmonary effort is normal.     Breath sounds: Normal breath sounds. No wheezing, rhonchi or rales.  Neurological:     Mental Status: She is alert.  Psychiatric:        Mood and Affect: Mood normal.        Behavior: Behavior normal.     Lab Results  Component Value Date   WBC 4.7 03/09/2022   HGB 14.8 03/09/2022   HCT 43.7 03/09/2022   PLT 178 03/09/2022   GLUCOSE 96 03/09/2022   CHOL 148 11/14/2021   TRIG 33 11/14/2021   HDL 58 11/14/2021   LDLCALC 82 11/14/2021   ALT 17 03/09/2022   AST 22 03/09/2022   NA 141 03/09/2022   K 4.3 03/09/2022   CL 104 03/09/2022   CREATININE 0.89 03/09/2022   BUN 14 03/09/2022   CO2 25 03/09/2022   TSH 2.53 03/17/2020   INR 1.75 (H) 03/03/2010   HGBA1C 5.4 02/27/2019     Assessment & Plan:   Problem List Items Addressed This Visit       Cardiovascular and Mediastinum   Mild aortic stenosis   Relevant Medications   ezetimibe (ZETIA) 10 MG tablet   atorvastatin (LIPITOR) 80 MG tablet     Digestive   GERD (gastroesophageal reflux disease)    Continue Pepcid.      Relevant Medications   famotidine (PEPCID) 40 MG tablet     Musculoskeletal and Integument   Osteoporosis - Primary    Doing well on Fosamax.  Advised calcium and vitamin D.  Referral placed to nutrition as requested.      Relevant Orders   Amb ref to Medical Nutrition Therapy-MNT     Other   Hyperlipidemia    Well-controlled on atorvastatin and Zetia.  Continue.  Refilled today.      Relevant Medications   ezetimibe (ZETIA) 10 MG tablet   atorvastatin (LIPITOR) 80 MG tablet   Thigh numbness    Patient did not tolerate  gabapentin.  She will wait on additional treatment at this time.  Advised her that she may need an injection.  She is to let me know if this continues to persist and does not improve.      Other Visit Diagnoses  Morbid obesity ([place])       Relevant Orders   Amb ref to Medical Nutrition Therapy-MNT       Meds ordered this encounter  Medications   ezetimibe (ZETIA) 10 MG tablet    Sig: Take 1 tablet (10 mg total) by mouth daily.    Dispense:  90 tablet    Refill:  3   atorvastatin (LIPITOR) 80 MG tablet    Sig: Take 1 tablet (80 mg total) by mouth daily.    Dispense:  90 tablet    Refill:  3   famotidine (PEPCID) 40 MG tablet    Sig: Take 1 tablet (40 mg total) by mouth daily as needed.    Dispense:  90 tablet    Refill:  3    Follow-up:  Return in about 6 months (around 12/27/2022).  [place]

## 2022-06-29 NOTE — Assessment & Plan Note (Signed)
Doing well on Fosamax.  Advised calcium and vitamin D.  Referral placed to nutrition as requested.

## 2022-06-29 NOTE — Assessment & Plan Note (Signed)
Continue Pepcid  

## 2022-06-29 NOTE — Assessment & Plan Note (Signed)
Patient did not tolerate gabapentin.  She will wait on additional treatment at this time.  Advised her that she may need an injection.  She is to let me know if this continues to persist and does not improve.

## 2022-07-03 ENCOUNTER — Other Ambulatory Visit: Payer: Self-pay | Admitting: *Deleted

## 2022-07-03 DIAGNOSIS — Z79899 Other long term (current) drug therapy: Secondary | ICD-10-CM

## 2022-07-07 DIAGNOSIS — Z79899 Other long term (current) drug therapy: Secondary | ICD-10-CM | POA: Diagnosis not present

## 2022-07-08 LAB — CMP14+EGFR
ALT: 19 IU/L (ref 0–32)
AST: 24 IU/L (ref 0–40)
Albumin/Globulin Ratio: 1.8 (ref 1.2–2.2)
Albumin: 4.4 g/dL (ref 3.8–4.8)
Alkaline Phosphatase: 69 IU/L (ref 44–121)
BUN/Creatinine Ratio: 17 (ref 12–28)
BUN: 14 mg/dL (ref 8–27)
Bilirubin Total: 0.8 mg/dL (ref 0.0–1.2)
CO2: 24 mmol/L (ref 20–29)
Calcium: 9.8 mg/dL (ref 8.7–10.3)
Chloride: 102 mmol/L (ref 96–106)
Creatinine, Ser: 0.82 mg/dL (ref 0.57–1.00)
Globulin, Total: 2.4 g/dL (ref 1.5–4.5)
Glucose: 90 mg/dL (ref 70–99)
Potassium: 4.4 mmol/L (ref 3.5–5.2)
Sodium: 140 mmol/L (ref 134–144)
Total Protein: 6.8 g/dL (ref 6.0–8.5)
eGFR: 76 mL/min/{1.73_m2} (ref >59)

## 2022-07-08 LAB — CBC WITH DIFFERENTIAL/PLATELET
Basophils Absolute: 0 10*3/uL (ref 0.0–0.2)
Basos: 0 %
EOS (ABSOLUTE): 0.2 10*3/uL (ref 0.0–0.4)
Eos: 3 %
Hematocrit: 43.2 % (ref 34.0–46.6)
Hemoglobin: 14.7 g/dL (ref 11.1–15.9)
Immature Grans (Abs): 0 10*3/uL (ref 0.0–0.1)
Immature Granulocytes: 0 %
Lymphocytes Absolute: 2.1 10*3/uL (ref 0.7–3.1)
Lymphs: 37 %
MCH: 32.3 pg (ref 26.6–33.0)
MCHC: 34 g/dL (ref 31.5–35.7)
MCV: 95 fL (ref 79–97)
Monocytes Absolute: 0.6 10*3/uL (ref 0.1–0.9)
Monocytes: 11 %
Neutrophils Absolute: 2.9 10*3/uL (ref 1.4–7.0)
Neutrophils: 49 %
Platelets: 167 10*3/uL (ref 150–450)
RBC: 4.55 x10E6/uL (ref 3.77–5.28)
RDW: 12.8 % (ref 11.7–15.4)
WBC: 5.8 10*3/uL (ref 3.4–10.8)

## 2022-07-08 NOTE — Progress Notes (Signed)
CBC and CMP are normal.

## 2022-07-11 ENCOUNTER — Telehealth: Payer: Self-pay

## 2022-07-11 DIAGNOSIS — M0579 Rheumatoid arthritis with rheumatoid factor of multiple sites without organ or systems involvement: Secondary | ICD-10-CM

## 2022-07-11 NOTE — Telephone Encounter (Signed)
Patient received [id number] PAP application from [place], completed and returned to clinic. Pending provider portion (placed in Dr. Arlean Hopping folder).  [place] [facility] PharmD Candidate [phone]

## 2022-07-12 NOTE — Telephone Encounter (Signed)
PAP submission pending PA renewal to be attached. Submitted a Prior Authorization request to  [employer] for [place]  for ENBREL via CoverMyMeds  Key: [id number]  Will not send on CMM. Called [employer] to initiate on phone. Rep will fax form to clinic  Phone: [phone], option 4  Knox Saliva, PharmD, MPH, BCPS, CPP Clinical Pharmacist (Rheumatology and Pulmonology)

## 2022-07-17 NOTE — Telephone Encounter (Signed)
Submitted a Prior Authorization request to  [employer]  for ENBREL via fax. Will update once we receive a response.  Fax: [phone] Phone: [phone], option [place] of Pharm[facility] PharmD Candidate [phone]

## 2022-07-17 NOTE — Telephone Encounter (Signed)
Received notification from  [employer]  regarding a prior authorization for ENBREL. Authorization has been APPROVED from 07/17/22 to 07/18/2023.   Request ID # [id number]  Submitted Patient Assistance Application to [employer] for ENBREL along with provider portion, patient portion, med list, insurance card copy, PA. Will update patient when we receive a response.  Fax# [phone] Phone# [phone]  Knox Saliva, PharmD, MPH, BCPS, CPP Clinical Pharmacist (Rheumatology and Pulmonology)

## 2022-07-17 NOTE — Telephone Encounter (Signed)
Submitted Patient Assistance Application to [employer] for ENBREL along with provider portion, patient portion, PA, insurance cards, and medication list. Will update patient when we receive a response.  Fax# [phone] Phone# [phone]  [place] [facility] PharmD Candidate [phone]

## 2022-07-20 MED ORDER — ENBREL MINI 50 MG/ML ~~LOC~~ SOCT: 50.0000 mg | SUBCUTANEOUS | 0 refills | Status: DC

## 2022-07-20 NOTE — Telephone Encounter (Signed)
Received a fax from  [place] regarding an approval for ENBREL patient assistance from 07/19/22 to 08/21/2023.   Phone number: [phone]  Patient notified via MyChart  Knox Saliva, PharmD, MPH, BCPS, CPP Clinical Pharmacist (Rheumatology and Pulmonology)

## 2022-07-24 ENCOUNTER — Ambulatory Visit (INDEPENDENT_AMBULATORY_CARE_PROVIDER_SITE_OTHER): Payer: PPO | Admitting: Family Medicine

## 2022-08-04 ENCOUNTER — Other Ambulatory Visit: Payer: Self-pay | Admitting: Family Medicine

## 2022-08-22 ENCOUNTER — Encounter (INDEPENDENT_AMBULATORY_CARE_PROVIDER_SITE_OTHER): Payer: Self-pay | Admitting: Family Medicine

## 2022-08-22 ENCOUNTER — Ambulatory Visit (INDEPENDENT_AMBULATORY_CARE_PROVIDER_SITE_OTHER): Payer: PPO | Admitting: Family Medicine

## 2022-08-22 VITALS — BP 152/70 | HR 74 | Temp 97.6°F | Ht 63.0 in | Wt 248.0 lb

## 2022-08-22 DIAGNOSIS — E669 Obesity, unspecified: Secondary | ICD-10-CM

## 2022-08-22 DIAGNOSIS — Z6841 Body Mass Index (BMI) 40.0 and over, adult: Secondary | ICD-10-CM

## 2022-08-22 DIAGNOSIS — E7849 Other hyperlipidemia: Secondary | ICD-10-CM | POA: Diagnosis not present

## 2022-08-24 ENCOUNTER — Ambulatory Visit: Payer: PPO | Admitting: Nutrition

## 2022-08-31 ENCOUNTER — Encounter: Payer: Self-pay | Admitting: Family Medicine

## 2022-09-12 ENCOUNTER — Encounter: Payer: Self-pay | Admitting: Nutrition

## 2022-09-12 ENCOUNTER — Encounter: Payer: PPO | Attending: Family Medicine | Admitting: Nutrition

## 2022-09-12 DIAGNOSIS — M816 Localized osteoporosis [Lequesne]: Secondary | ICD-10-CM | POA: Insufficient documentation

## 2022-09-12 DIAGNOSIS — E782 Mixed hyperlipidemia: Secondary | ICD-10-CM | POA: Insufficient documentation

## 2022-09-12 NOTE — Progress Notes (Signed)
Medical Nutrition Therapy  Appointment Start time:  24  Appointment End time:  1330  Primary concerns today: Obesity, Osteoporosis.  Referral diagnosis: E66.01, M81 Preferred learning style: No preference  Learning readiness: Ready    NUTRITION ASSESSMENT  72 yr old wfemale here for help with her diet to lose weight and improve her osteoporosis. She is currently participating in [facility] in [place]. She notes she doesn't eat meat and is working on cutting out dairy products. BMI is 43.Emphasized that her calcium from plant based foods will be better for her health overall.   Has dieted fon and off for quite a long time. Has done Julien Girt, [employer] and other weight loss programs with limited success. Currently enrolled in weight management program in [place].  PCP Dr. Lacinda Axon. PMH: Hyperlipidemia, Obesity, GERD, Anxiety. Activity: [facility]. Admits to being an emotional eater. She notes she eats 2-3 meals per day and 2-3 snacks. She says her biggest weekeness is sugar/sweets. Is using mealime app for meal ideas and has been successful in cooking those foods. Struggles with snacking.  Hyperlipidemia: is on Lipitor and Zetia. Lipids are WNL now.  She is willing to work on eating more whole food, plant based meals to provide desired weight loss and work on her emotional eating.   She would like to see a therapist that specializes in this area.  Current diet is low in plant based foods, fiber and higher in calories from snacks and processed foods.  Anthropometrics  Wt Readings from Last 3 Encounters:  09/12/22 247 lb (112 kg)  08/22/22 248 lb (112.5 kg)  06/28/22 246 lb (111.6 kg)   Ht Readings from Last 3 Encounters:  09/12/22 '5\' 3"'$  (1.6 m)  08/22/22 '5\' 3"'$  (1.6 m)  06/28/22 '5\' 3"'$  (1.6 m)   Body mass index is 43.75 kg/m. '@BMIFA'$ @ Facility age limit for growth %iles is 20 years. Facility age limit for growth %iles is 20 years.    Clinical Medical  Hx: See chart Medications: See chart Labs:      Latest Ref Rng & Units 07/07/2022   11:35 AM 03/09/2022   10:17 AM 11/14/2021   10:19 AM  CMP  Glucose 70 - 99 mg/dL 90  96  93   BUN 8 - 27 mg/dL '14  14  13   '$ Creatinine 0.57 - 1.00 mg/dL 0.82  0.89  0.82   Sodium 134 - 144 mmol/L 140  141  141   Potassium 3.5 - 5.2 mmol/L 4.4  4.3  4.4   Chloride 96 - 106 mmol/L 102  104  105   CO2 20 - 29 mmol/L '24  25  25   '$ Calcium 8.7 - 10.3 mg/dL 9.8  10.4  10.0   Total Protein 6.0 - 8.5 g/dL 6.8  6.5  6.4   Total Bilirubin 0.0 - 1.2 mg/dL 0.8  0.6  0.6   Alkaline Phos 44 - 121 IU/L 69  69  78   AST 0 - 40 IU/L '24  22  23   '$ ALT 0 - 32 IU/L '19  17  14    '$ Lipid Panel     Component Value Date/Time   CHOL 148 11/14/2021 1022   TRIG 33 11/14/2021 1022   HDL 58 11/14/2021 1022   CHOLHDL 2.6 11/14/2021 1022   CHOLHDL 6.6 07/09/2014 0819   VLDL 13 07/09/2014 0819   LDLCALC 82 11/14/2021 1022   LABVLDL 8 11/14/2021 1022  Notable Signs/Symptoms: None  Lifestyle & Dietary Hx Lives with her husband and sister. Doesn't like cheese or dairy. Or meat  Estimated daily fluid intake: 128 lbs oz Supplements:  Sleep: 8 hrs Stress / self-care: None Current average weekly physical activity: 3 x per week.  24-Hr Dietary Recall First Meal: PB jelly sandwich- dave killer bread, with apriciot presevers, tangelo, coffee with creamer Snack:  Second Meal: : chicken tenders, with bread crumbs and green beans, birthday cake, water Third Meal: Skipped:   Snack:  Beverages: water  Estimated Energy Needs Calories: 1200 Carbohydrate: 135g Protein: 90g Fat: 33g   NUTRITION DIAGNOSIS  NI-1.7 Predicted excessive energy intake As related to Obesity.  As evidenced by BMI 43 and diet recall.   NUTRITION INTERVENTION  Nutrition education (E-1) on the following topics:  Lifestyle Medicine  - Whole Food, Plant Predominant Nutrition is highly recommended: Eat Plenty of vegetables, Mushrooms, fruits,  Legumes, Whole Grains, Nuts, seeds in lieu of processed meats, processed snacks/pastries red meat, poultry, eggs.    -It is better to avoid simple carbohydrates including: Cakes, Sweet Desserts, Ice Cream, Soda (diet and regular), Sweet Tea, Candies, Chips, Cookies, Store Bought Juices, Alcohol in Excess of  1-2 drinks a day, Lemonade,  Artificial Sweeteners, Doughnuts, Coffee Creamers, "Sugar-free" Products, etc, etc.  This is not a complete list.....  Exercise: If you are able: 30 -60 minutes a day ,4 days a week, or 150 minutes a week.  The longer the better.  Combine stretch, strength, and aerobic activities.  If you were told in the past that you have high risk for cardiovascular diseases, you may seek evaluation by your heart doctor prior to initiating moderate to intense exercise programs.  Mindful eating and emotional eating  Handouts Provided Include  Lifestyle Medicine  Learning Style & Readiness for Change Teaching method utilized: Visual & Auditory  Demonstrated degree of understanding via: Teach Back  Barriers to learning/adherence to lifestyle change: None  Goals Established by Pt Goals   Increase more lower carb vegetables to make 50% of plate Eat meals on time: B) 6-8, L)12-2 D) 5-7 Keep working out 3 times per week Focus on whole plant based meals [place] over [place] or We are what we eat, Go to fulllplateliving.org   MONITORING & EVALUATION Dietary intake, weekly physical activity, and weight PRN  Next Steps  Patient is to work on cutting out snacks and eating meals on time.

## 2022-09-12 NOTE — Patient Instructions (Signed)
Goals   Increase more lower carb vegetables to make 50% of plate Eat meals on time: B) 6-8, L)12-2 D) 5-7 Keep working out 3 times per week Focus on whole plant based meals [place] over [place] or We are what we eat, Go to fulllplateliving.org

## 2022-09-19 ENCOUNTER — Encounter (INDEPENDENT_AMBULATORY_CARE_PROVIDER_SITE_OTHER): Payer: Self-pay | Admitting: Family Medicine

## 2022-09-19 ENCOUNTER — Ambulatory Visit (INDEPENDENT_AMBULATORY_CARE_PROVIDER_SITE_OTHER): Payer: PPO | Admitting: Family Medicine

## 2022-09-19 ENCOUNTER — Other Ambulatory Visit: Payer: Self-pay | Admitting: *Deleted

## 2022-09-19 VITALS — BP 121/78 | HR 70 | Temp 98.1°F | Ht 63.0 in | Wt 242.0 lb

## 2022-09-19 DIAGNOSIS — E7849 Other hyperlipidemia: Secondary | ICD-10-CM

## 2022-09-19 DIAGNOSIS — Z79899 Other long term (current) drug therapy: Secondary | ICD-10-CM

## 2022-09-19 DIAGNOSIS — Z6841 Body Mass Index (BMI) 40.0 and over, adult: Secondary | ICD-10-CM

## 2022-09-19 DIAGNOSIS — E559 Vitamin D deficiency, unspecified: Secondary | ICD-10-CM | POA: Diagnosis not present

## 2022-09-21 DIAGNOSIS — Z79899 Other long term (current) drug therapy: Secondary | ICD-10-CM | POA: Diagnosis not present

## 2022-09-21 DIAGNOSIS — E559 Vitamin D deficiency, unspecified: Secondary | ICD-10-CM | POA: Diagnosis not present

## 2022-09-22 LAB — CMP14+EGFR
ALT: 13 IU/L (ref 0–32)
AST: 20 IU/L (ref 0–40)
Albumin/Globulin Ratio: 2 (ref 1.2–2.2)
Albumin: 4.4 g/dL (ref 3.8–4.8)
Alkaline Phosphatase: 67 IU/L (ref 44–121)
BUN/Creatinine Ratio: 20 (ref 12–28)
BUN: 19 mg/dL (ref 8–27)
Bilirubin Total: 0.7 mg/dL (ref 0.0–1.2)
CO2: 22 mmol/L (ref 20–29)
Calcium: 10.4 mg/dL — ABNORMAL HIGH (ref 8.7–10.3)
Chloride: 103 mmol/L (ref 96–106)
Creatinine, Ser: 0.97 mg/dL (ref 0.57–1.00)
Globulin, Total: 2.2 g/dL (ref 1.5–4.5)
Glucose: 96 mg/dL (ref 70–99)
Potassium: 4.3 mmol/L (ref 3.5–5.2)
Sodium: 141 mmol/L (ref 134–144)
Total Protein: 6.6 g/dL (ref 6.0–8.5)
eGFR: 62 mL/min/{1.73_m2} (ref >59)

## 2022-09-22 LAB — CBC WITH DIFFERENTIAL/PLATELET
Basophils Absolute: 0 10*3/uL (ref 0.0–0.2)
Basos: 1 %
EOS (ABSOLUTE): 0.1 10*3/uL (ref 0.0–0.4)
Eos: 2 %
Hematocrit: 43.8 % (ref 34.0–46.6)
Hemoglobin: 14.1 g/dL (ref 11.1–15.9)
Immature Grans (Abs): 0 10*3/uL (ref 0.0–0.1)
Immature Granulocytes: 0 %
Lymphocytes Absolute: 1.8 10*3/uL (ref 0.7–3.1)
Lymphs: 35 %
MCH: 30.1 pg (ref 26.6–33.0)
MCHC: 32.2 g/dL (ref 31.5–35.7)
MCV: 94 fL (ref 79–97)
Monocytes Absolute: 0.5 10*3/uL (ref 0.1–0.9)
Monocytes: 9 %
Neutrophils Absolute: 2.7 10*3/uL (ref 1.4–7.0)
Neutrophils: 53 %
Platelets: 187 10*3/uL (ref 150–450)
RBC: 4.68 x10E6/uL (ref 3.77–5.28)
RDW: 13.4 % (ref 11.7–15.4)
WBC: 5 10*3/uL (ref 3.4–10.8)

## 2022-09-22 LAB — VITAMIN D 25 HYDROXY (VIT D DEFICIENCY, FRACTURES): Vit D, 25-Hydroxy: 45.2 ng/mL (ref 30.0–100.0)

## 2022-09-22 NOTE — Progress Notes (Signed)
CBC and CMP are stable.  Calcium is elevated.  Patient should avoid calcium supplement.

## 2022-09-28 NOTE — Progress Notes (Signed)
Chief Complaint:   OBESITY [name] is here to discuss her progress with her obesity treatment plan along with follow-up of her obesity related diagnoses. [name] is on keeping a food journal and adhering to recommended goals of 1200-1400 calories and 93 grams of protein and states she is following her eating plan approximately 80% of the time. [name] states she is exercising 50 minutes 3 times per week.  Today's visit was #: 100 Starting weight: 267 lbs Starting date: 10/31/2018 Today's weight: 242 lbs Today's date: 09/19/2022 Total lbs lost to date: 25 Total lbs lost since last in-office visit: 1  Interim History: [name] has started to exercise 3 times a week-tai chi, weight training and chair exercises.  Went and saw dietitian and discussed change to a more plant/vegetable based.  She is doing a combination of plant based and journaling plan.  She feels better in her body.  Doing some meal line meals.  Subjective:   1. Vitamin D deficiency [name] is on over-the-counter vitamin D.  She notes fatigue.  2. Other hyperlipidemia [name] is on Lipitor.  Occasional myalgias.  Assessment/Plan:   1. Vitamin D deficiency We will obtain labs today.  Vitamin D level with Dr. Estanislado Pandy  labs.  - VITAMIN D 25 Hydroxy (Vit-D Deficiency, Fractures)  2. Other hyperlipidemia Will obtain labs with PCP.  3.Morbid obesity ([place])  4. BMI 40.0-44.9, adult [facility]) [name] is currently in the action stage of change. As such, her goal is to continue with weight loss efforts. She has agreed to keeping a food journal and adhering to recommended goals of 1200-1400 calories and 90+ grams of protein and the Vegetarian Plan daily.   Exercise goals: As is.  Behavioral modification strategies: increasing lean protein intake, meal planning and cooking strategies, keeping healthy foods in the home, and planning for success.  [name] has agreed to follow-up with our clinic in 6 weeks. She was informed of  the importance of frequent follow-up visits to maximize her success with intensive lifestyle modifications for her multiple health conditions.   Objective:   Blood pressure 121/78, pulse 70, temperature 98.1 F (36.7 C), height 5' 3"$  (1.6 m), weight 242 lb (109.8 kg), SpO2 98 %. Body mass index is 42.87 kg/m.  General: Cooperative, alert, well developed, in no acute distress. HEENT: Conjunctivae and lids unremarkable. Cardiovascular: Regular rhythm.  Lungs: Normal work of breathing. Neurologic: No focal deficits.   Lab Results  Component Value Date   CREATININE 0.97 09/21/2022   BUN 19 09/21/2022   NA 141 09/21/2022   K 4.3 09/21/2022   CL 103 09/21/2022   CO2 22 09/21/2022   Lab Results  Component Value Date   ALT 13 09/21/2022   AST 20 09/21/2022   ALKPHOS 67 09/21/2022   BILITOT 0.7 09/21/2022   Lab Results  Component Value Date   HGBA1C 5.4 02/27/2019   HGBA1C 5.5 10/31/2018   Lab Results  Component Value Date   INSULIN 8.3 02/27/2019   INSULIN 12.3 10/31/2018   Lab Results  Component Value Date   TSH 2.53 03/17/2020   Lab Results  Component Value Date   CHOL 148 11/14/2021   HDL 58 11/14/2021   LDLCALC 82 11/14/2021   TRIG 33 11/14/2021   CHOLHDL 2.6 11/14/2021   Lab Results  Component Value Date   VD25OH 45.2 09/21/2022   VD25OH 32.7 09/29/2020   VD25OH 49.1 03/09/2020   Lab Results  Component Value Date   WBC 5.0 09/21/2022  HGB 14.1 09/21/2022   HCT 43.8 09/21/2022   MCV 94 09/21/2022   PLT 187 09/21/2022   No results found for: "IRON", "TIBC", "FERRITIN"  Attestation Statements:   Reviewed by clinician on day of visit: allergies, medications, problem list, medical history, surgical history, family history, social history, and previous encounter notes.  I, Elnora Morrison, RMA am acting as transcriptionist for Coralie Common, MD.  I have reviewed the above documentation for accuracy and completeness, and I agree with the above. -  Coralie Common, MD

## 2022-09-29 ENCOUNTER — Other Ambulatory Visit (HOSPITAL_COMMUNITY): Payer: Self-pay | Admitting: Family Medicine

## 2022-09-29 DIAGNOSIS — Z1231 Encounter for screening mammogram for malignant neoplasm of breast: Secondary | ICD-10-CM

## 2022-10-04 ENCOUNTER — Ambulatory Visit (HOSPITAL_COMMUNITY)
Admission: RE | Admit: 2022-10-04 | Discharge: 2022-10-04 | Disposition: A | Discharge status: Home / Self Care | Payer: PPO | Source: Ambulatory Visit | Attending: Family Medicine | Admitting: Family Medicine

## 2022-10-04 DIAGNOSIS — Z1231 Encounter for screening mammogram for malignant neoplasm of breast: Secondary | ICD-10-CM | POA: Insufficient documentation

## 2022-10-04 NOTE — Progress Notes (Deleted)
Office Visit Note  Patient: [name]             Date of Birth: 1951-01-16           MRN: [id number]             PCP: Coral Spikes, DO Referring: Coral Spikes, DO Visit Date: 10/18/2022 Occupation: '@GUAROCC'$ @  Subjective:  No chief complaint on file.   History of Present Illness: [name] is a 72 y.o. female ***     Activities of Daily Living:  Patient reports morning stiffness for *** {minute/hour:19697}.   Patient {ACTIONS;DENIES/REPORTS:21021675::"Denies"} nocturnal pain.  Difficulty dressing/grooming: {ACTIONS;DENIES/REPORTS:21021675::"Denies"} Difficulty climbing stairs: {ACTIONS;DENIES/REPORTS:21021675::"Denies"} Difficulty getting out of chair: {ACTIONS;DENIES/REPORTS:21021675::"Denies"} Difficulty using hands for taps, buttons, cutlery, and/or writing: {ACTIONS;DENIES/REPORTS:21021675::"Denies"}  No Rheumatology ROS completed.   PMFS History:  Patient Active Problem List   Diagnosis Date Noted   Mild aortic stenosis 06/29/2022   Osteoporosis 06/28/2022   Thigh numbness 04/14/2022   Anxiety 06/28/2021   GERD (gastroesophageal reflux disease) 06/28/2021   History of total knee replacement, bilateral 10/30/2018   Seropositive rheumatoid arthritis of multiple sites ([place]) 07/20/2014   Obesity (BMI 30-39.9) 12/19/2007   DDD (degenerative disc disease), lumbar 06/27/2007   Hyperlipidemia 02/21/2007    Past Medical History:  Diagnosis Date   Allergic rhinitis    Allergy    Seasonal. Fall   Anxiety    Arthritis    Asthma    Constipation    Depression    Essential hypertension 04/04/2007   Gallbladder problem    Hematuria 08/25/2015   Hyperlipidemia    Hypertension    Impetigo    in nose per patient, dx by derm.   Obesity    Osteoarthritis    Osteoporosis 06/28/2022   Pelvic pain in female 08/25/2015   Rheumatoid arthritis ([place])    Swallowing difficulty    Swelling     Family History  Problem Relation Age of Onset   Heart attack  Father    Alcoholism Father    Depression Father    Heart disease Father    Hyperlipidemia Father    Hypertension Father    Leukemia Mother    Hypertension Mother    Cancer Mother    Depression Mother    Early death Mother    35 / [place] Mother    Obesity Sister    Other Sister        breathing problems   Hepatitis C Sister    Arthritis Sister    Depression Sister    Colon cancer Sister    Squamous cell carcinoma Sister    Emphysema Brother    Obesity Daughter    Polycystic ovary syndrome Daughter    Depression Daughter    Obesity Son    Other Son        knee problems   Cancer Maternal Grandmother        breast   Other Brother        aneursym   Arthritis Sister    Depression Sister    Squamous cell carcinoma Sister    Obesity Son    Asthma Son    Depression Son    Obesity Son    Diabetes Other        runs on dad's side of the family   Arthritis Sister    Cancer Sister    Depression Sister    Obesity Sister    Cancer Sister    Obesity Sister  Depression Son    Obesity Son    Depression Son    Obesity Son    Vision loss Maternal Aunt    Past Surgical History:  Procedure Laterality Date   BACK SURGERY     CHOLECYSTECTOMY     COLONOSCOPY  2007   sessile sigmoid polyp x 1; path: serrated adenoma   COLONOSCOPY N/A 10/12/2014   Procedure: COLONOSCOPY;  Surgeon: [name], MD;  Location: [facility];  Service: Endoscopy;  Laterality: N/A;   COLONOSCOPY N/A 03/05/2020   Procedure: COLONOSCOPY;  Surgeon: Daneil Dolin, MD;  Location: [facility];  Service: Endoscopy;  Laterality: N/A;  9:30   EYE SURGERY  2019   Cataract   JOINT REPLACEMENT     BIL knee    KNEE ARTHROPLASTY     KNEE SURGERY     bilateral knee replacement   LIPOMA EXCISION     x 2   SPINE SURGERY  2008   Rod L4-L5   TONSILLECTOMY AND ADENOIDECTOMY     Social History   Social History Narrative   Not on file   Immunization History  Administered Date(s)  Administered   COVID-19, mRNA, vaccine(Comirnaty)12 years and older 05/12/2022   Fluad Quad(high Dose 65+) 05/27/2020, 05/12/2022   Influenza Split 06/17/2013   Influenza Whole 06/27/2007, 05/08/2008   Influenza, High Dose Seasonal PF 06/14/2017, 06/18/2018   Influenza,inj,Quad PF,6+ Mos 07/12/2015   Influenza-Unspecified 06/14/2017, 06/18/2018, 05/11/2019, 05/14/2021   PFIZER(Purple Top)SARS-COV-2 Vaccination 10/12/2019, 11/04/2019, 06/03/2020, 05/23/2021   PNEUMOCOCCAL CONJUGATE-20 12/26/2021, 06/05/2022   Pneumococcal Polysaccharide-23 02/18/1993, 06/27/2007   Zoster Recombinat (Shingrix) 08/19/2018, 06/05/2022     Objective: Vital Signs: There were no vitals taken for this visit.   Physical Exam   Musculoskeletal Exam: ***  CDAI Exam: CDAI Score: -- Patient Global: --; Provider Global: -- Swollen: --; Tender: -- Joint Exam 10/18/2022   No joint exam has been documented for this visit   There is currently no information documented on the homunculus. Go to the Rheumatology activity and complete the homunculus joint exam.  Investigation: No additional findings.  Imaging: No results found.  Recent Labs: Lab Results  Component Value Date   WBC 5.0 09/21/2022   HGB 14.1 09/21/2022   PLT 187 09/21/2022   NA 141 09/21/2022   K 4.3 09/21/2022   CL 103 09/21/2022   CO2 22 09/21/2022   GLUCOSE 96 09/21/2022   BUN 19 09/21/2022   CREATININE 0.97 09/21/2022   BILITOT 0.7 09/21/2022   ALKPHOS 67 09/21/2022   AST 20 09/21/2022   ALT 13 09/21/2022   PROT 6.6 09/21/2022   ALBUMIN 4.4 09/21/2022   CALCIUM 10.4 (H) 09/21/2022   GFRAA 73 07/23/2020   QFTBGOLDPLUS Negative 03/09/2022   May 23, 2022 DEXA scan: The BMD measured at Forearm Radius 33% is 0.534 g/cm2 with a T-score of -2.5.DualFemur Neck Right 05/23/2022 71.0 Osteopenia -1.2 0.877 g/cm2 0.7%  Speciality Comments: PLQ Eye Exam: 12/03/2019 WNL @ [facility] Follow up in 1 year MTX -dcd 09/19 Enbrel start  date 03/10/20  Procedures:  No procedures performed Allergies: Codeine, Cyclosporine, and Propylene glycol   Assessment / Plan:     Visit Diagnoses: No diagnosis found.  Orders: No orders of the defined types were placed in this encounter.  No orders of the defined types were placed in this encounter.   Face-to-face time spent with patient was *** minutes. Greater than 50% of time was spent in counseling and coordination of care.  Follow-Up Instructions: No follow-ups on file.   [name], MD  Note - This record has been created using Editor, commissioning.  Chart creation errors have been sought, but may not always  have been located. Such creation errors do not reflect on  the standard of medical care.

## 2022-10-11 ENCOUNTER — Other Ambulatory Visit: Payer: Self-pay | Admitting: Family Medicine

## 2022-10-11 DIAGNOSIS — F419 Anxiety disorder, unspecified: Secondary | ICD-10-CM

## 2022-10-11 DIAGNOSIS — M0579 Rheumatoid arthritis with rheumatoid factor of multiple sites without organ or systems involvement: Secondary | ICD-10-CM

## 2022-10-12 MED ORDER — ENBREL MINI 50 MG/ML ~~LOC~~ SOCT: 50.0000 mg | SUBCUTANEOUS | 0 refills | Status: DC

## 2022-10-12 NOTE — Telephone Encounter (Signed)
Next Visit: 10/18/2022  Last Visit: 05/09/2022  Last Fill: 07/20/2022  [id number] rheumatoid arthritis of multiple sites   Current Dose per office note 05/09/2022: Enbrel 50 mg sq injections once every 10 days-started July 2021   Labs: 09/21/2022 CBC and CMP are stable   TB Gold: 03/09/2022 Negative   Okay to refill Enbrel?

## 2022-10-18 ENCOUNTER — Ambulatory Visit: Payer: PPO | Admitting: Rheumatology

## 2022-10-18 DIAGNOSIS — M5136 Other intervertebral disc degeneration, lumbar region: Secondary | ICD-10-CM

## 2022-10-18 DIAGNOSIS — M7061 Trochanteric bursitis, right hip: Secondary | ICD-10-CM

## 2022-10-18 DIAGNOSIS — Z8639 Personal history of other endocrine, nutritional and metabolic disease: Secondary | ICD-10-CM

## 2022-10-18 DIAGNOSIS — F32A Depression, unspecified: Secondary | ICD-10-CM

## 2022-10-18 DIAGNOSIS — G8929 Other chronic pain: Secondary | ICD-10-CM

## 2022-10-18 DIAGNOSIS — M8589 Other specified disorders of bone density and structure, multiple sites: Secondary | ICD-10-CM

## 2022-10-18 DIAGNOSIS — M0579 Rheumatoid arthritis with rheumatoid factor of multiple sites without organ or systems involvement: Secondary | ICD-10-CM

## 2022-10-18 DIAGNOSIS — Z86010 Personal history of colonic polyps: Secondary | ICD-10-CM

## 2022-10-18 DIAGNOSIS — I10 Essential (primary) hypertension: Secondary | ICD-10-CM

## 2022-10-18 DIAGNOSIS — Z8614 Personal history of Methicillin resistant Staphylococcus aureus infection: Secondary | ICD-10-CM

## 2022-10-18 DIAGNOSIS — Z79899 Other long term (current) drug therapy: Secondary | ICD-10-CM

## 2022-10-18 DIAGNOSIS — Z8709 Personal history of other diseases of the respiratory system: Secondary | ICD-10-CM

## 2022-10-18 DIAGNOSIS — Z96653 Presence of artificial knee joint, bilateral: Secondary | ICD-10-CM

## 2022-10-25 ENCOUNTER — Ambulatory Visit (INDEPENDENT_AMBULATORY_CARE_PROVIDER_SITE_OTHER): Payer: PPO | Admitting: Family Medicine

## 2022-10-25 VITALS — BP 138/82 | Ht 63.0 in | Wt 245.0 lb

## 2022-10-25 DIAGNOSIS — R200 Anesthesia of skin: Secondary | ICD-10-CM

## 2022-10-25 DIAGNOSIS — I2510 Atherosclerotic heart disease of native coronary artery without angina pectoris: Secondary | ICD-10-CM | POA: Diagnosis not present

## 2022-10-25 DIAGNOSIS — R233 Spontaneous ecchymoses: Secondary | ICD-10-CM | POA: Diagnosis not present

## 2022-10-25 DIAGNOSIS — I700 Atherosclerosis of aorta: Secondary | ICD-10-CM

## 2022-10-25 NOTE — Patient Instructions (Signed)
I believe the petechiae is from venous stasis. Stay active. Consider compression stockings. Would let Rheumatology take a look as well. Normal platelet count.  Xray ordered.  Call with concerns.  Take care  Dr. Lacinda Axon

## 2022-10-25 NOTE — Assessment & Plan Note (Signed)
Recent normal platelet count. Suspect that this is from venous stasis. Advised to make Rheumatology aware. No symptoms to reflect vasculitis. Compression, supportive care.

## 2022-10-25 NOTE — Progress Notes (Signed)
Subjective:  Patient ID: [name], female    DOB: [birth year]  Age: 72 y.o. MRN: [id number]  CC: Chief Complaint  Patient presents with   would like legs checked    Patient has red scaly spots on her legs she would like checked Patient would like to discuss blood calcium levels and calcium plaques in the arteries     HPI:  72 year old female presents for evaluation above.  Patient has petechiae on her lower extremities. No pain, fever, weight loss etc. Would like me to examine this today.  Patient having issues with sciatica and would like xray of her lumbar spine.  Has questions about plaque/calcifications.    Patient Active Problem List   Diagnosis Date Noted   Coronary atherosclerosis 10/25/2022   Aortic atherosclerosis ([place]) 10/25/2022   Petechiae 10/25/2022   Mild aortic stenosis 06/29/2022   Osteoporosis 06/28/2022   Thigh numbness 04/14/2022   Anxiety 06/28/2021   GERD (gastroesophageal reflux disease) 06/28/2021   History of total knee replacement, bilateral 10/30/2018   Seropositive rheumatoid arthritis of multiple sites ([place]) 07/20/2014   Obesity (BMI 30-39.9) 12/19/2007   DDD (degenerative disc disease), lumbar 06/27/2007   Hyperlipidemia 02/21/2007    Social Hx   Social History   Socioeconomic History   Marital status: Married    Spouse name: Linsay Arno   Number of children: 4   Years of education: Not on file   Highest education level: Not on file  Occupational History   Occupation: Stay at home  Tobacco Use   Smoking status: Never    Passive exposure: Never   Smokeless tobacco: Never  Vaping Use   Vaping Use: Never used  Substance and Sexual Activity   Alcohol use: Yes    Comment: sometimes; rarely   Drug use: No   Sexual activity: Yes    Birth control/protection: Post-menopausal  Other Topics Concern   Not on file  Social History Narrative   Not on file   Social Determinants of Health   Financial Resource Strain: Low Risk   (11/08/2021)   Overall Financial Resource Strain (CARDIA)    Difficulty of Paying Living Expenses: Not very hard  Food Insecurity: No Food Insecurity (11/08/2021)   Hunger Vital Sign    Worried About Running Out of Food in the Last Year: Never true    Ran Out of Food in the Last Year: Never true  Transportation Needs: No Transportation Needs (11/08/2021)   PRAPARE - Hydrologist (Medical): No    Lack of Transportation (Non-Medical): No  Physical Activity: Sufficiently Active (11/08/2021)   Exercise Vital Sign    Days of Exercise per Week: 3 days    Minutes of Exercise per Session: 50 min  Stress: Stress Concern Present (11/08/2021)   [place]    Feeling of Stress : To some extent  Social Connections: Socially Integrated (11/08/2021)   Social Connection and Isolation Panel [NHANES]    Frequency of Communication with Friends and Family: More than three times a week    Frequency of Social Gatherings with Friends and Family: More than three times a week    Attends Religious Services: More than 4 times per year    Active Member of [employer] or Organizations: Yes    Attends Music therapist: More than 4 times per year    Marital Status: Married    Review of Systems Per HPI  Objective:  BP 138/82   Ht '5\' 3"'$  (1.6 m)   Wt 245 lb (111.1 kg)   BMI 43.40 kg/m      10/25/2022    1:45 PM 09/19/2022    2:00 PM 09/12/2022   12:36 PM  BP/Weight  Systolic BP 0000000 [id number]   Diastolic BP 82 78   Wt. (Lbs) 245 242 247  BMI 43.4 kg/m2 42.87 kg/m2 43.75 kg/m2    Physical Exam Vitals and nursing note reviewed.  Constitutional:      General: She is not in acute distress.    Appearance: Normal appearance. She is obese.  HENT:     Head: Normocephalic and atraumatic.  Pulmonary:     Effort: Pulmonary effort is normal. No respiratory distress.  Skin:    Comments: Bilateral lower extremities with  scattered petechiae.   Neurological:     Mental Status: She is alert.  Psychiatric:        Mood and Affect: Mood normal.        Behavior: Behavior normal.     Lab Results  Component Value Date   WBC 5.0 09/21/2022   HGB 14.1 09/21/2022   HCT 43.8 09/21/2022   PLT 187 09/21/2022   GLUCOSE 96 09/21/2022   CHOL 148 11/14/2021   TRIG 33 11/14/2021   HDL 58 11/14/2021   LDLCALC 82 11/14/2021   ALT 13 09/21/2022   AST 20 09/21/2022   NA 141 09/21/2022   K 4.3 09/21/2022   CL 103 09/21/2022   CREATININE 0.97 09/21/2022   BUN 19 09/21/2022   CO2 22 09/21/2022   TSH 2.53 03/17/2020   INR 1.75 (H) 03/03/2010   HGBA1C 5.4 02/27/2019     Assessment & Plan:   Problem List Items Addressed This Visit       Cardiovascular and Mediastinum   Aortic atherosclerosis ([place])   Coronary atherosclerosis     Other   Petechiae - Primary    Recent normal platelet count. Suspect that this is from venous stasis. Advised to make Rheumatology aware. No symptoms to reflect vasculitis. Compression, supportive care.      Thigh numbness    Xray of lumbar spine ordered.       Relevant Orders   DG Lumbar Spine Complete   Thersa Salt DO [place]

## 2022-10-25 NOTE — Assessment & Plan Note (Signed)
X-ray of lumbar spine ordered

## 2022-10-26 NOTE — Progress Notes (Signed)
Chief Complaint:   OBESITY [name] is here to discuss her progress with her obesity treatment plan along with follow-up of her obesity related diagnoses. [name] is on keeping a food journal and adhering to recommended goals of 1350-1450 calories and 90 protein and states she is following her eating plan approximately 0% of the time. [name] states she is in an exercise class 60 minutes 3 times per week.  Today's visit was #: 42 Starting weight: 68 LBS Starting date: 10/31/2018 Today's weight: 248 LBS Today's date: 08/22/2022 Total lbs lost to date: 19 LBS Total lbs lost since last in-office visit: +5 LBS  Interim History: Patient has added in a new exercise class.  She is still waiting to discuss osteoporosis and has a dietitian appointment in 2 days.  She is going to mealtime, food planner and shopping done for you.  She has gravitating toward Flexcatarian (meat is not her main focus).  She is still going to the [facility] center and exercising.  Subjective:   1. Other hyperlipidemia Patient is on Zetia and Lipitor with good control of LDL.  Assessment/Plan:   1. Other hyperlipidemia Continue Lipitor and Zetia, no change in dose or medications.  2. Obesity with current BMI of 43.9 [name] is currently in the action stage of change. As such, her goal is to continue with weight loss efforts. She has agreed to keeping a food journal and adhering to recommended goals of 1350-1450 calories and 90+ protein daily.   Exercise goals: No exercise has been prescribed at this time.  Behavioral modification strategies: increasing lean protein intake, meal planning and cooking strategies, keeping healthy foods in the home, and planning for success.  [name] has agreed to follow-up with our clinic in 4 weeks. She was informed of the importance of frequent follow-up visits to maximize her success with intensive lifestyle modifications for her multiple health conditions.   Objective:   Blood  pressure (!) 152/70, pulse 74, temperature 97.6 F (36.4 C), height '5\' 3"'$  (1.6 m), weight 248 lb (112.5 kg), SpO2 100 %. Body mass index is 43.93 kg/m.  General: Cooperative, alert, well developed, in no acute distress. HEENT: Conjunctivae and lids unremarkable. Cardiovascular: Regular rhythm.  Lungs: Normal work of breathing. Neurologic: No focal deficits.   Lab Results  Component Value Date   CREATININE 0.97 09/21/2022   BUN 19 09/21/2022   NA 141 09/21/2022   K 4.3 09/21/2022   CL 103 09/21/2022   CO2 22 09/21/2022   Lab Results  Component Value Date   ALT 13 09/21/2022   AST 20 09/21/2022   ALKPHOS 67 09/21/2022   BILITOT 0.7 09/21/2022   Lab Results  Component Value Date   HGBA1C 5.4 02/27/2019   HGBA1C 5.5 10/31/2018   Lab Results  Component Value Date   INSULIN 8.3 02/27/2019   INSULIN 12.3 10/31/2018   Lab Results  Component Value Date   TSH 2.53 03/17/2020   Lab Results  Component Value Date   CHOL 148 11/14/2021   HDL 58 11/14/2021   LDLCALC 82 11/14/2021   TRIG 33 11/14/2021   CHOLHDL 2.6 11/14/2021   Lab Results  Component Value Date   VD25OH 45.2 09/21/2022   VD25OH 32.7 09/29/2020   VD25OH 49.1 03/09/2020   Lab Results  Component Value Date   WBC 5.0 09/21/2022   HGB 14.1 09/21/2022   HCT 43.8 09/21/2022   MCV 94 09/21/2022   PLT 187 09/21/2022   No results found for: "IRON", "  TIBC", "FERRITIN"  Attestation Statements:   Reviewed by clinician on day of visit: allergies, medications, problem list, medical history, surgical history, family history, social history, and previous encounter notes.  I, Davy Pique, RMA, am acting as transcriptionist for Coralie Common, MD.  I have reviewed the above documentation for accuracy and completeness, and I agree with the above. - Coralie Common, MD

## 2022-10-30 ENCOUNTER — Ambulatory Visit (INDEPENDENT_AMBULATORY_CARE_PROVIDER_SITE_OTHER): Payer: PPO | Admitting: Internal Medicine

## 2022-10-30 ENCOUNTER — Encounter (INDEPENDENT_AMBULATORY_CARE_PROVIDER_SITE_OTHER): Payer: Self-pay | Admitting: Internal Medicine

## 2022-10-30 VITALS — BP 140/84 | HR 71 | Temp 97.9°F | Ht 63.0 in | Wt 234.0 lb

## 2022-10-30 DIAGNOSIS — Z6841 Body Mass Index (BMI) 40.0 and over, adult: Secondary | ICD-10-CM | POA: Diagnosis not present

## 2022-10-30 DIAGNOSIS — M810 Age-related osteoporosis without current pathological fracture: Secondary | ICD-10-CM

## 2022-10-30 DIAGNOSIS — E669 Obesity, unspecified: Secondary | ICD-10-CM | POA: Diagnosis not present

## 2022-10-30 NOTE — Progress Notes (Signed)
Office: [phone]  /  Fax: [phone]  WEIGHT SUMMARY AND BIOMETRICS  Vitals Temp: 97.9 F (36.6 C) BP: (!) 140/84 Pulse Rate: 71 SpO2: 98 %   Anthropometric Measurements Height: '5\' 3"'$  (1.6 m) Weight: 234 lb (106.1 kg) BMI (Calculated): 41.46 Weight at Last Visit: 242 lb Weight Lost Since Last Visit: 7 lb Starting Weight: 267 lb Total Weight Loss (lbs): 32 lb (14.5 kg) Peak Weight: 290 lb   Body Composition  Body Fat %: 53.6 % Fat Mass (lbs): 125.8 lbs Muscle Mass (lbs): 103.4 lbs Visceral Fat Rating : 19    HPI  Chief Complaint: OBESITY  [name] is here to discuss her progress with her obesity treatment plan. She is on the keeping a food journal and adhering to recommended goals of 1200 calories and 50 protein and states she is following her eating plan approximately 50 % of the time. She states she is exercising 40 minutes 5 times per week.  Interval History:  Since last office visit she has lost 7 lbs. She reports good adherence to reduced calorie nutritional plan. She has been working on Interior and spatial designer. Has not been able to hit protein goal. Prefers plant based nutrition.  '[x]'$ Denies '[]'$ Reports problems with appetite and hunger signals.  '[x]'$ Denies '[]'$ Reports problems with satiety and satiation.  '[x]'$ Denies '[]'$ Reports problems with eating patterns and portion control.  '[x]'$ Denies '[]'$ Reports abnormal cravings Sleeping approximately 8 hours a day.  Sleep described as: '[x]'$ Restorative '[]'$ Unrestorative '[]'$ Interrupted Stress levels are reported as medium and due to Internal.  Barriers identified none.   Pharmacotherapy for weight loss: She is currently taking no anti-obesity medication.   Weight promoting medications identified: Other: biologic .  ASSESSMENT AND PLAN  TREATMENT PLAN FOR OBESITY:  Recommended Dietary Goals  [name] is currently in the action stage of change. As such, her goal is to continue weight management plan. She has agreed to:  keeping a food journal and adhering to recommended goals of 1200 calories and 60 grams protein. Target 20 grams with meals   Behavioral Intervention  We discussed the following Behavioral Modification Strategies today: increasing lean protein intake, increasing vegetables, increasing water intake, work on meal planning and easy cooking plans, work on tracking and journaling calories using tracking App, and reading food labels .  She will work on making protein goals using plant-based protein.  She was provided with some ideas.  Additional resources provided today: Handout on healthy eating and balanced plate and Handout on complex carbohydrates and lean sources of protein  Recommended Physical Activity Goals  Alara has been advised to work up to 150 minutes of moderate intensity aerobic activity a week and strengthening exercises 2-3 times per week for cardiovascular health, weight loss maintenance and preservation of muscle mass.   She has agreed to :  '[]'$  Continue current level of physical activity  '[]'$  Think about ways to increase physical activity '[]'$  Start strengthening exercises with a goal of 2-3 sessions a week  '[]'$  Start aerobic activity with a goal of 150 minutes a week at moderate intensity.  '[x]'$  Increase the intensity, frequency or duration of strengthening exercises  '[]'$  Increase the intensity, frequency or duration of aerobic exercises   '[x]'$  Increase physical activity in their day and reduce sedentary time (increase NEAT). '[]'$  Work on scheduling and tracking physical activity.   Pharmacotherapy We discussed various medication options to help [name] with her weight loss efforts and we both agreed to : continue with nutritional and behavioral strategies  ASSOCIATED CONDITIONS  ADDRESSED TODAY  Obesity with current BMI of 41  Age-related osteoporosis without current pathological fracture Assessment & Plan: She is currently on biphosphonate, calcium and vitamin D.  We discussed  the benefits of weightbearing exercises and strengthening.  Continue current regimen      PHYSICAL EXAM:  Blood pressure (!) 140/84, pulse 71, temperature 97.9 F (36.6 C), height '5\' 3"'$  (1.6 m), weight 234 lb (106.1 kg), SpO2 98 %. Body mass index is 41.45 kg/m.  General: She is overweight, cooperative, alert, well developed, and in no acute distress. PSYCH: Has normal mood, affect and thought process.   HEENT: EOMI, sclerae are anicteric. Lungs: Normal breathing effort, no conversational dyspnea. Extremities: No edema.  Neurologic: No gross sensory or motor deficits. No tremors or fasciculations noted.    DIAGNOSTIC DATA REVIEWED:  BMET    Component Value Date/Time   NA 141 09/21/2022 1119   K 4.3 09/21/2022 1119   CL 103 09/21/2022 1119   CO2 22 09/21/2022 1119   GLUCOSE 96 09/21/2022 1119   GLUCOSE 104 (H) 03/07/2021 1006   BUN 19 09/21/2022 1119   CREATININE 0.97 09/21/2022 1119   CREATININE 0.95 02/02/2020 1031   CALCIUM 10.4 (H) 09/21/2022 1119   GFRNONAA >60 03/07/2021 1006   GFRNONAA 62 02/02/2020 1031   GFRAA 73 07/23/2020 1047   GFRAA 71 02/02/2020 1031   Lab Results  Component Value Date   HGBA1C 5.4 02/27/2019   HGBA1C 5.5 10/31/2018   Lab Results  Component Value Date   INSULIN 8.3 02/27/2019   INSULIN 12.3 10/31/2018   Lab Results  Component Value Date   TSH 2.53 03/17/2020   CBC    Component Value Date/Time   WBC 5.0 09/21/2022 1119   WBC 4.0 03/07/2021 1006   RBC 4.68 09/21/2022 1119   RBC 4.62 03/07/2021 1006   HGB 14.1 09/21/2022 1119   HCT 43.8 09/21/2022 1119   PLT 187 09/21/2022 1119   MCV 94 09/21/2022 1119   MCH 30.1 09/21/2022 1119   MCH 27.1 03/07/2021 1006   MCHC 32.2 09/21/2022 1119   MCHC 30.8 03/07/2021 1006   RDW 13.4 09/21/2022 1119   Iron Studies No results found for: "IRON", "TIBC", "FERRITIN", "IRONPCTSAT" Lipid Panel     Component Value Date/Time   CHOL 148 11/14/2021 1022   TRIG 33 11/14/2021 1022   HDL  58 11/14/2021 1022   CHOLHDL 2.6 11/14/2021 1022   CHOLHDL 6.6 07/09/2014 0819   VLDL 13 07/09/2014 0819   LDLCALC 82 11/14/2021 1022   Hepatic Function Panel     Component Value Date/Time   PROT 6.6 09/21/2022 1119   ALBUMIN 4.4 09/21/2022 1119   AST 20 09/21/2022 1119   ALT 13 09/21/2022 1119   ALKPHOS 67 09/21/2022 1119   BILITOT 0.7 09/21/2022 1119   BILIDIR 0.15 12/20/2016 0857   IBILI 0.4 07/09/2014 0819      Component Value Date/Time   TSH 2.53 03/17/2020 0935   Nutritional Lab Results  Component Value Date   VD25OH 45.2 09/21/2022   VD25OH 32.7 09/29/2020   VD25OH 49.1 03/09/2020     Return in about 4 weeks (around 11/27/2022) for Dr. Jeani Sow.. She was informed of the importance of frequent follow up visits to maximize her success with intensive lifestyle modifications for her multiple health conditions.   ATTESTASTION STATEMENTS:  Reviewed by clinician on day of visit: allergies, medications, problem list, medical history, surgical history, family history, social history, and previous encounter notes.   Time  spent on visit including pre-visit chart review and post-visit care and charting was 30 minutes.    Thomes Dinning, MD

## 2022-10-30 NOTE — Assessment & Plan Note (Signed)
She is currently on biphosphonate, calcium and vitamin D.  We discussed the benefits of weightbearing exercises and strengthening.  Continue current regimen

## 2022-11-02 ENCOUNTER — Ambulatory Visit (HOSPITAL_COMMUNITY)
Admission: RE | Admit: 2022-11-02 | Discharge: 2022-11-02 | Disposition: A | Discharge status: Home / Self Care | Payer: PPO | Source: Ambulatory Visit | Attending: Family Medicine | Admitting: Family Medicine

## 2022-11-02 DIAGNOSIS — M545 Low back pain, unspecified: Secondary | ICD-10-CM | POA: Diagnosis not present

## 2022-11-02 DIAGNOSIS — R200 Anesthesia of skin: Secondary | ICD-10-CM | POA: Diagnosis not present

## 2022-11-02 DIAGNOSIS — M8588 Other specified disorders of bone density and structure, other site: Secondary | ICD-10-CM | POA: Diagnosis not present

## 2022-11-08 ENCOUNTER — Other Ambulatory Visit: Payer: Self-pay | Admitting: Family Medicine

## 2022-11-08 DIAGNOSIS — M9902 Segmental and somatic dysfunction of thoracic region: Secondary | ICD-10-CM | POA: Diagnosis not present

## 2022-11-08 DIAGNOSIS — R200 Anesthesia of skin: Secondary | ICD-10-CM

## 2022-11-08 DIAGNOSIS — M546 Pain in thoracic spine: Secondary | ICD-10-CM | POA: Diagnosis not present

## 2022-11-08 DIAGNOSIS — T84498A Other mechanical complication of other internal orthopedic devices, implants and grafts, initial encounter: Secondary | ICD-10-CM

## 2022-11-27 DIAGNOSIS — Z6841 Body Mass Index (BMI) 40.0 and over, adult: Secondary | ICD-10-CM | POA: Diagnosis not present

## 2022-11-27 DIAGNOSIS — M5430 Sciatica, unspecified side: Secondary | ICD-10-CM | POA: Diagnosis not present

## 2022-12-01 ENCOUNTER — Ambulatory Visit (INDEPENDENT_AMBULATORY_CARE_PROVIDER_SITE_OTHER): Payer: PPO

## 2022-12-01 VITALS — Ht 63.0 in | Wt 234.0 lb

## 2022-12-01 DIAGNOSIS — Z0000 Encounter for general adult medical examination without abnormal findings: Secondary | ICD-10-CM | POA: Diagnosis not present

## 2022-12-01 NOTE — Patient Instructions (Signed)
[name] , Thank you for taking time to come for your Medicare Wellness Visit. I appreciate your ongoing commitment to your health goals. Please review the following plan we discussed and let me know if I can assist you in the future.   These are the goals we discussed:  Goals      Exercise 150 min/wk Moderate Activity     Continue to exercise and stay healthy.  Travel more to see family.     Remain active and independent        This is a list of the screening recommended for you and due dates:  Health Maintenance  Topic Date Due   DTaP/Tdap/Td vaccine (1 - Tdap) Never done   COVID-19 Vaccine (6 - 2023-24 season) 07/07/2022   Flu Shot  03/22/2023   Medicare Annual Wellness Visit  12/01/2023   Mammogram  10/04/2024   Colon Cancer Screening  03/05/2030   Pneumonia Vaccine  Completed   DEXA scan (bone density measurement)  Completed   Hepatitis C Screening: USPSTF Recommendation to screen - Ages 76-79 yo.  Completed   Zoster (Shingles) Vaccine  Completed   HPV Vaccine  Aged Out    Advanced directives: Please bring a copy of your health care power of attorney and living will to the office to be added to your chart at your convenience.   Conditions/risks identified: Aim for 30 minutes of exercise or brisk walking, 6-8 glasses of water, and 5 servings of fruits and vegetables each day.   Next appointment: Follow up in one year for your annual wellness visit    Preventive Care 65 Years and Older, Female Preventive care refers to lifestyle choices and visits with your health care provider that can promote health and wellness. What does preventive care include? A yearly physical exam. This is also called an annual well check. Dental exams once or twice a year. Routine eye exams. Ask your health care provider how often you should have your eyes checked. Personal lifestyle choices, including: Daily care of your teeth and gums. Regular physical activity. Eating a healthy  diet. Avoiding tobacco and drug use. Limiting alcohol use. Practicing safe sex. Taking low-dose aspirin every day. Taking vitamin and mineral supplements as recommended by your health care provider. What happens during an annual well check? The services and screenings done by your health care provider during your annual well check will depend on your age, overall health, lifestyle risk factors, and family history of disease. Counseling  Your health care provider may ask you questions about your: Alcohol use. Tobacco use. Drug use. Emotional well-being. Home and relationship well-being. Sexual activity. Eating habits. History of falls. Memory and ability to understand (cognition). Work and work Astronomer. Reproductive health. Screening  You may have the following tests or measurements: Height, weight, and BMI. Blood pressure. Lipid and cholesterol levels. These may be checked every 5 years, or more frequently if you are over 55 years old. Skin check. Lung cancer screening. You may have this screening every year starting at age 81 if you have a 30-pack-year history of smoking and currently smoke or have quit within the past 15 years. Fecal occult blood test (FOBT) of the stool. You may have this test every year starting at age 14. Flexible sigmoidoscopy or colonoscopy. You may have a sigmoidoscopy every 5 years or a colonoscopy every 10 years starting at age 18. Hepatitis C blood test. Hepatitis B blood test. Sexually transmitted disease (STD) testing. Diabetes screening. This is done  by checking your blood sugar (glucose) after you have not eaten for a while (fasting). You may have this done every 1-3 years. Bone density scan. This is done to screen for osteoporosis. You may have this done starting at age 27. Mammogram. This may be done every 1-2 years. Talk to your health care provider about how often you should have regular mammograms. Talk with your health care provider about  your test results, treatment options, and if necessary, the need for more tests. Vaccines  Your health care provider may recommend certain vaccines, such as: Influenza vaccine. This is recommended every year. Tetanus, diphtheria, and acellular pertussis (Tdap, Td) vaccine. You may need a Td booster every 10 years. Zoster vaccine. You may need this after age 63. Pneumococcal 13-valent conjugate (PCV13) vaccine. One dose is recommended after age 47. Pneumococcal polysaccharide (PPSV23) vaccine. One dose is recommended after age 99. Talk to your health care provider about which screenings and vaccines you need and how often you need them. This information is not intended to replace advice given to you by your health care provider. Make sure you discuss any questions you have with your health care provider. Document Released: 09/03/2015 Document Revised: 04/26/2016 Document Reviewed: 06/08/2015 Elsevier Interactive Patient Education  2017 [place] Prevention in the Home Falls can cause injuries. They can happen to people of all ages. There are many things you can do to make your home safe and to help prevent falls. What can I do on the outside of my home? Regularly fix the edges of walkways and driveways and fix any cracks. Remove anything that might make you trip as you walk through a door, such as a raised step or threshold. Trim any bushes or trees on the path to your home. Use bright outdoor lighting. Clear any walking paths of anything that might make someone trip, such as rocks or tools. Regularly check to see if handrails are loose or broken. Make sure that both sides of any steps have handrails. Any raised decks and porches should have guardrails on the edges. Have any leaves, snow, or ice cleared regularly. Use sand or salt on walking paths during winter. Clean up any spills in your garage right away. This includes oil or grease spills. What can I do in the bathroom? Use  night lights. Install grab bars by the toilet and in the tub and shower. Do not use towel bars as grab bars. Use non-skid mats or decals in the tub or shower. If you need to sit down in the shower, use a plastic, non-slip stool. Keep the floor dry. Clean up any water that spills on the floor as soon as it happens. Remove soap buildup in the tub or shower regularly. Attach bath mats securely with double-sided non-slip rug tape. Do not have throw rugs and other things on the floor that can make you trip. What can I do in the bedroom? Use night lights. Make sure that you have a light by your bed that is easy to reach. Do not use any sheets or blankets that are too big for your bed. They should not hang down onto the floor. Have a firm chair that has side arms. You can use this for support while you get dressed. Do not have throw rugs and other things on the floor that can make you trip. What can I do in the kitchen? Clean up any spills right away. Avoid walking on wet floors. Keep items that you use  a lot in easy-to-reach places. If you need to reach something above you, use a strong step stool that has a grab bar. Keep electrical cords out of the way. Do not use floor polish or wax that makes floors slippery. If you must use wax, use non-skid floor wax. Do not have throw rugs and other things on the floor that can make you trip. What can I do with my stairs? Do not leave any items on the stairs. Make sure that there are handrails on both sides of the stairs and use them. Fix handrails that are broken or loose. Make sure that handrails are as long as the stairways. Check any carpeting to make sure that it is firmly attached to the stairs. Fix any carpet that is loose or worn. Avoid having throw rugs at the top or bottom of the stairs. If you do have throw rugs, attach them to the floor with carpet tape. Make sure that you have a light switch at the top of the stairs and the bottom of the  stairs. If you do not have them, ask someone to add them for you. What else can I do to help prevent falls? Wear shoes that: Do not have high heels. Have rubber bottoms. Are comfortable and fit you well. Are closed at the toe. Do not wear sandals. If you use a stepladder: Make sure that it is fully opened. Do not climb a closed stepladder. Make sure that both sides of the stepladder are locked into place. Ask someone to hold it for you, if possible. Clearly mark and make sure that you can see: Any grab bars or handrails. First and last steps. Where the edge of each step is. Use tools that help you move around (mobility aids) if they are needed. These include: Canes. Walkers. Scooters. Crutches. Turn on the lights when you go into a dark area. Replace any light bulbs as soon as they burn out. Set up your furniture so you have a clear path. Avoid moving your furniture around. If any of your floors are uneven, fix them. If there are any pets around you, be aware of where they are. Review your medicines with your doctor. Some medicines can make you feel dizzy. This can increase your chance of falling. Ask your doctor what other things that you can do to help prevent falls. This information is not intended to replace advice given to you by your health care provider. Make sure you discuss any questions you have with your health care provider. Document Released: 06/03/2009 Document Revised: 01/13/2016 Document Reviewed: 09/11/2014 Elsevier Interactive Patient Education  2017 [employer].

## 2022-12-01 NOTE — Progress Notes (Signed)
Subjective:   [name] is a 72 y.o. female who presents for Medicare Annual (Subsequent) preventive examination.  I connected with  [name] on 12/01/22 by a audio enabled telemedicine application and verified that I am speaking with the correct person using two identifiers.  Patient Location: Home  Provider Location: Office/Clinic  I discussed the limitations of evaluation and management by telemedicine. The patient expressed understanding and agreed to proceed.  Review of Systems     Cardiac Risk Factors include: advanced age (>36men, >82 women);hypertension;dyslipidemia;obesity (BMI >30kg/m2)     Objective:    Today's Vitals   12/01/22 0840  Weight: 234 lb (106.1 kg)  Height:  (1.6 m)   Body mass index is 41.45 kg/m.     12/01/2022    8:43 AM 11/08/2021    8:25 AM 03/07/2021    9:59 AM 03/05/2020    8:07 AM 01/03/2016    8:19 AM 10/12/2014    1:37 PM  Advanced Directives  Does Patient Have a Medical Advance Directive? Yes Yes Yes Yes Yes Yes  Type of Estate agent of [place];Living will Healthcare Power of [place];Living will Living will Living will Healthcare Power of [place];Living will Healthcare Power of [place];Living will  Does patient want to make changes to medical advance directive? No - Patient declined       Copy of Healthcare Power of Attorney in Chart? No - copy requested No - copy requested   No - copy requested     Current Medications (verified) Outpatient Encounter Medications as of 12/01/2022  Medication Sig   acetaminophen (TYLENOL) 500 MG tablet Take 500-1,000 mg by mouth every 6 (six) hours as needed (pain).    alendronate (FOSAMAX) 70 MG tablet Take 1 tablet (70 mg total) by mouth every 7 (seven) days. Take with a full glass of water on an empty stomach.   atorvastatin (LIPITOR) 80 MG tablet Take 1 tablet (80 mg total) by mouth daily.   B Complex Vitamins (VITAMIN B COMPLEX) TABS Take 1 tablet by mouth at  bedtime.    busPIRone (BUSPAR) 5 MG tablet TAKE 1 TABLET BY MOUTH TWICE A DAY   chlorzoxazone (PARAFON) 500 MG tablet Take 500 mg by mouth daily as needed for muscle spasms (typically with travel only).   Cholecalciferol (VITAMIN D) 125 MCG (5000 UT) CAPS Take 5,000 Units by mouth daily after breakfast.   Etanercept (ENBREL MINI) 50 MG/ML SOCT Inject 50 mg into the skin once a week.   ezetimibe (ZETIA) 10 MG tablet Take 1 tablet (10 mg total) by mouth daily.   famotidine (PEPCID) 40 MG tablet Take 1 tablet (40 mg total) by mouth daily as needed.   Magnesium 500 MG CAPS Take 500 mg by mouth.   Probiotic Product (ALIGN PO) Take 1 capsule by mouth daily.    Propylene Glycol (SYSTANE BALANCE OP) Place 1 drop into both eyes 4 (four) times daily as needed (dry/irritated eyes.).    traMADol (ULTRAM) 50 MG tablet TAKE 1-2 TABLETS EVERY 8 (EIGHT) HOURS AS NEEDED FOR MODERATE PAIN OR SEVERE PAIN.   Facility-Administered Encounter Medications as of 12/01/2022  Medication   methylPREDNISolone acetate (DEPO-MEDROL) injection 40 mg   methylPREDNISolone acetate (DEPO-MEDROL) injection 40 mg    Allergies (verified) Codeine, Cyclosporine, and Propylene glycol   History: Past Medical History:  Diagnosis Date   Allergic rhinitis    Allergy    Seasonal. Fall   Anxiety    Arthritis  Asthma    Constipation    Depression    Essential hypertension 04/04/2007   Gallbladder problem    Hematuria 08/25/2015   Hyperlipidemia    Hypertension    Impetigo    in nose per patient, dx by derm.   Obesity    Osteoarthritis    Osteoporosis 06/28/2022   Pelvic pain in female 08/25/2015   Rheumatoid arthritis    Swallowing difficulty    Swelling    Past Surgical History:  Procedure Laterality Date   BACK SURGERY     CHOLECYSTECTOMY     COLONOSCOPY  2007   sessile sigmoid polyp x 1; path: serrated adenoma   COLONOSCOPY N/A 10/12/2014   Procedure: COLONOSCOPY;  Surgeon: West Bali, MD;  Location: [facility]  END[facility];  Service: Endoscopy;  Laterality: N/A;   COLONOSCOPY N/A 03/05/2020   Procedure: COLONOSCOPY;  Surgeon: Corbin Ade, MD;  Location: [facility];  Service: Endoscopy;  Laterality: N/A;  9:30   EYE SURGERY  2019   Cataract   JOINT REPLACEMENT     BIL knee    KNEE ARTHROPLASTY     KNEE SURGERY     bilateral knee replacement   LIPOMA EXCISION     x 2   SPINE SURGERY  2008   Rod L4-L5   TONSILLECTOMY AND ADENOIDECTOMY     Family History  Problem Relation Age of Onset   Heart attack Father    Alcoholism Father    Depression Father    Heart disease Father    Hyperlipidemia Father    Hypertension Father    Leukemia Mother    Hypertension Mother    Cancer Mother    Depression Mother    Early death Mother    Miscarriages / [place] Mother    Obesity Sister    Other Sister        breathing problems   Hepatitis C Sister    Arthritis Sister    Depression Sister    Colon cancer Sister    Squamous cell carcinoma Sister    Emphysema Brother    Obesity Daughter    Polycystic ovary syndrome Daughter    Depression Daughter    Obesity Son    Other Son        knee problems   Cancer Maternal Grandmother        breast   Obesity Maternal Grandmother    Other Brother        aneursym   Arthritis Sister    Depression Sister    Squamous cell carcinoma Sister    Obesity Son    Asthma Son    Depression Son    Obesity Son    Diabetes Other        runs on dad's side of the family   Arthritis Sister    Cancer Sister    Depression Sister    Obesity Sister    Cancer Sister    Obesity Sister    Depression Son    Obesity Son    Depression Son    Obesity Son    Vision loss Maternal Aunt    Social History   Socioeconomic History   Marital status: Married    Spouse name: Cherylene Ferrufino   Number of children: 4   Years of education: Not on file   Highest education level: Not on file  Occupational History   Occupation: Stay at home  Tobacco Use   Smoking  status: Never  Passive exposure: Never   Smokeless tobacco: Never  Vaping Use   Vaping Use: Never used  Substance and Sexual Activity   Alcohol use: Yes    Comment: sometimes; rarely   Drug use: No   Sexual activity: Yes    Birth control/protection: Post-menopausal  Other Topics Concern   Not on file  Social History Narrative   Not on file   Social Determinants of Health   Financial Resource Strain: Low Risk  (12/01/2022)   Overall Financial Resource Strain (CARDIA)    Difficulty of Paying Living Expenses: Not hard at all  Food Insecurity: No Food Insecurity (12/01/2022)   Hunger Vital Sign    Worried About Running Out of Food in the Last Year: Never true    Ran Out of Food in the Last Year: Never true  Transportation Needs: No Transportation Needs (12/01/2022)   PRAPARE - Administrator, Civil Service (Medical): No    Lack of Transportation (Non-Medical): No  Physical Activity: Sufficiently Active (12/01/2022)   Exercise Vital Sign    Days of Exercise per Week: 4 days    Minutes of Exercise per Session: 60 min  Stress: No Stress Concern Present (12/01/2022)   [employer] of Occupational Health - Occupational Stress Questionnaire    Feeling of Stress : Only a little  Social Connections: Socially Integrated (12/01/2022)   Social Connection and Isolation Panel [NHANES]    Frequency of Communication with Friends and Family: More than three times a week    Frequency of Social Gatherings with Friends and Family: Three times a week    Attends Religious Services: More than 4 times per year    Active Member of Clubs or Organizations: Yes    Attends Engineer, structural: More than 4 times per year    Marital Status: Married    Tobacco Counseling Counseling given: Not Answered   Clinical Intake:  Pre-visit preparation completed: Yes  Pain : No/denies pain  Diabetes: No  How often do you need to have someone help you when you read instructions,  pamphlets, or other written materials from your doctor or pharmacy?: 1 - Never  Diabetic?No   Interpreter Needed?: No  Information entered by :: Kandis Fantasia LPN   Activities of Daily Living    12/01/2022    8:43 AM 11/27/2022   12:36 PM  In your present state of health, do you have any difficulty performing the following activities:  Hearing? 0 0  Vision? 0 0  Difficulty concentrating or making decisions? 0 0  Walking or climbing stairs? 0 0  Dressing or bathing? 0 0  Doing errands, shopping? 0 0  Preparing Food and eating ? N N  Using the Toilet? N N  In the past six months, have you accidently leaked urine? N N  Do you have problems with loss of bowel control? N N  Managing your Medications? N N  Managing your Finances? N N  Housekeeping or managing your Housekeeping? N N    Patient Care Team: Tommie Sams, DO as PCP - General (Family Medicine) West Bali, MD (Inactive) as Consulting Physician (Gastroenterology)  Indicate any recent Medical Services you may have received from other than [facility] providers in the past year (date may be approximate).     Assessment:   This is a routine wellness examination for [name].  Hearing/Vision screen Hearing Screening - Comments:: Denies hearing difficulties   Vision Screening - Comments:: Wears rx glasses - up to  date with routine eye exams with Dr. Charise Killian    Dietary issues and exercise activities discussed: Current Exercise Habits: Home exercise routine, Type of exercise: walking;strength training/weights;stretching, Time (Minutes): 60, Frequency (Times/Week): 4, Weekly Exercise (Minutes/Week): 240, Intensity: Mild   Goals Addressed             This Visit's Progress    Remain active and independent        Depression Screen    12/01/2022    8:42 AM 10/25/2022    1:46 PM 09/12/2022   12:37 PM 06/28/2022    8:34 AM 11/08/2021    8:22 AM 06/28/2021    8:43 AM 03/16/2021   11:04 AM  PHQ 2/9 Scores  PHQ - 2 Score 0  1 0 0 1 0 0  PHQ- 9 Score  3  4   0    Fall Risk    12/01/2022    8:43 AM 11/27/2022   12:36 PM 10/25/2022    1:46 PM 09/12/2022   12:37 PM 06/28/2022    8:34 AM  Fall Risk   Falls in the past year? 1 1 1  0 1  Number falls in past yr: 0 0 0 0 1  Injury with Fall? 0 0 0 0 1  Risk for fall due to : History of fall(s)  History of fall(s)  No Fall Risks  Follow up Falls evaluation completed;Education provided;Falls prevention discussed  Falls evaluation completed      FALL RISK PREVENTION PERTAINING TO THE HOME:  Any stairs in or around the home? Yes  If so, are there any without handrails? No  Home free of loose throw rugs in walkways, pet beds, electrical cords, etc? Yes  Adequate lighting in your home to reduce risk of falls? Yes   ASSISTIVE DEVICES UTILIZED TO PREVENT FALLS:  Life alert? No  Use of a cane, walker or w/c? No  Grab bars in the bathroom? Yes  Shower chair or bench in shower? No  Elevated toilet seat or a handicapped toilet? Yes   TIMED UP AND GO:  Was the test performed? No . Telephonic visit   Cognitive Function:        12/01/2022    8:43 AM 11/08/2021    8:30 AM  6CIT Screen  What Year? 0 points 0 points  What month? 0 points 0 points  What time? 0 points 0 points  Count back from 20 0 points 0 points  Months in reverse 0 points 0 points  Repeat phrase 0 points 2 points  Total Score 0 points 2 points    Immunizations Immunization History  Administered Date(s) Administered   COVID-19, mRNA, vaccine(Comirnaty)12 years and older 05/12/2022   Fluad Quad(high Dose 65+) 05/27/2020, 05/12/2022   Influenza Split 06/17/2013   Influenza Whole 06/27/2007, 05/08/2008   Influenza, High Dose Seasonal PF 06/14/2017, 06/18/2018   Influenza,inj,Quad PF,6+ Mos 07/12/2015   Influenza-Unspecified 06/14/2017, 06/18/2018, 05/11/2019, 05/14/2021   PFIZER(Purple Top)SARS-COV-2 Vaccination 10/12/2019, 11/04/2019, 06/03/2020, 05/23/2021   PNEUMOCOCCAL CONJUGATE-20  12/26/2021, 06/05/2022   Pneumococcal Polysaccharide-23 02/18/1993, 06/27/2007   Zoster Recombinat (Shingrix) 08/19/2018, 06/05/2022    TDAP status: Due, Education has been provided regarding the importance of this vaccine. Advised may receive this vaccine at local pharmacy or Health Dept. Aware to provide a copy of the vaccination record if obtained from local pharmacy or Health Dept. Verbalized acceptance and understanding.  Flu Vaccine status: Up to date  Pneumococcal vaccine status: Up to date  Covid-19 vaccine status: Information provided  on how to obtain vaccines.   Qualifies for Shingles Vaccine? Yes   Zostavax completed No   Shingrix Completed?: Yes  Screening Tests Health Maintenance  Topic Date Due   DTaP/Tdap/Td (1 - Tdap) Never done   COVID-19 Vaccine (6 - 2023-24 season) 07/07/2022   INFLUENZA VACCINE  03/22/2023   Medicare Annual Wellness (AWV)  12/01/2023   MAMMOGRAM  10/04/2024   COLONOSCOPY (Pts 45-66yrs Insurance coverage will need to be confirmed)  03/05/2030   Pneumonia Vaccine 48+ Years old  Completed   DEXA SCAN  Completed   Hepatitis C Screening  Completed   Zoster Vaccines- Shingrix  Completed   HPV VACCINES  Aged Out    Health Maintenance  Health Maintenance Due  Topic Date Due   DTaP/Tdap/Td (1 - Tdap) Never done   COVID-19 Vaccine (6 - 2023-24 season) 07/07/2022    Colorectal cancer screening: Type of screening: Colonoscopy. Completed 03/05/20. Repeat every 10 years  Mammogram status: Completed 10/04/22. Repeat every year  Bone Density status: Completed 05/23/22. Results reflect: Bone density results: OSTEOPOROSIS. Repeat every 2 years.  Lung Cancer Screening: (Low Dose CT Chest recommended if Age 29-80 years, 30 pack-year currently smoking OR have quit w/in 15years.) does not qualify.   Lung Cancer Screening Referral: n/a  Additional Screening:  Hepatitis C Screening: does qualify; Completed 03/06/17  Vision Screening: Recommended annual  ophthalmology exams for early detection of glaucoma and other disorders of the eye. Is the patient up to date with their annual eye exam?  Yes  Who is the provider or what is the name of the office in which the patient attends annual eye exams? Dr. Charise Killian  If pt is not established with a provider, would they like to be referred to a provider to establish care? No .   Dental Screening: Recommended annual dental exams for proper oral hygiene  Community Resource Referral / Chronic Care Management: CRR required this visit?  No   CCM required this visit?  No      Plan:     I have personally reviewed and noted the following in the patient's chart:   Medical and social history Use of alcohol, tobacco or illicit drugs  Current medications and supplements including opioid prescriptions. Patient is not currently taking opioid prescriptions. Functional ability and status Nutritional status Physical activity Advanced directives List of other physicians Hospitalizations, surgeries, and ER visits in previous 12 months Vitals Screenings to include cognitive, depression, and falls Referrals and appointments  In addition, I have reviewed and discussed with patient certain preventive protocols, quality metrics, and best practice recommendations. A written personalized care plan for preventive services as well as general preventive health recommendations were provided to patient.     Durwin Nora, [place]   1/61/0960   Due to this being a virtual visit, the after visit summary with patients personalized plan was offered to patient via mail or my-chart. Patient would like to access on my-chart/  Nurse Notes: No concerns

## 2022-12-11 ENCOUNTER — Ambulatory Visit (INDEPENDENT_AMBULATORY_CARE_PROVIDER_SITE_OTHER): Payer: PPO | Admitting: Family Medicine

## 2022-12-11 ENCOUNTER — Encounter (INDEPENDENT_AMBULATORY_CARE_PROVIDER_SITE_OTHER): Payer: Self-pay | Admitting: Family Medicine

## 2022-12-11 VITALS — BP 121/76 | HR 64 | Temp 98.3°F | Ht 63.0 in | Wt 234.0 lb

## 2022-12-11 DIAGNOSIS — Z6841 Body Mass Index (BMI) 40.0 and over, adult: Secondary | ICD-10-CM | POA: Diagnosis not present

## 2022-12-11 DIAGNOSIS — E7849 Other hyperlipidemia: Secondary | ICD-10-CM

## 2022-12-11 DIAGNOSIS — E669 Obesity, unspecified: Secondary | ICD-10-CM

## 2022-12-11 DIAGNOSIS — E559 Vitamin D deficiency, unspecified: Secondary | ICD-10-CM

## 2022-12-11 NOTE — Progress Notes (Signed)
Chief Complaint:   OBESITY [name] is here to discuss her progress with her obesity treatment plan along with follow-up of her obesity related diagnoses. [name] is on keeping a food journal and adhering to recommended goals of 1200 calories and 60 protein and states she is following her eating plan approximately 70% of the time. [name] states she is doing an exercise class for 60 minutes 3 times per week.  Today's visit was #: 40 Starting weight: 267 LBS Starting date: 10/31/2018 Today's weight: 234 LBS Today's date: 12/11/2022 Total lbs lost to date: 33 LBS Total lbs lost since last in-office visit: 0  Interim History: Patient presents after 6 weeks since last appointment.  Since that time she has been learning how to do a mostly vegetable and grain diet and incorporate enough protein daily.  Her sister is now left out of consideration of meal plans and food prep.  She has tried some new recipes and different preparations of food.   She has been getting most of her protein in from beans and very very rarely animal protein.  She has been doing edamame and morning star chicken nuggets for additional protein. She has been evaluated for her thigh numbness.  Subjective:   1. Vitamin D deficiency Patient is on OTC vitamin D.  Patient is positive for fatigue.  2. Other hyperlipidemia Patient is on Lipitor and Zetia.  Patient denies side effects of either medications.  Assessment/Plan:   1. Vitamin D deficiency Continue OTC vitamin D.  2. Other hyperlipidemia Continue current medications, no change in treatment.  3. BMI 40.0-44.9, adult  4. Obesity with starting BMI of 47.3 [name] is currently in the action stage of change. As such, her goal is to continue with weight loss efforts. She has agreed to keeping a food journal and adhering to recommended goals of 1200 calories and 80+ protein daily.   Exercise goals: All adults should avoid inactivity. Some physical activity is  better than none, and adults who participate in any amount of physical activity gain some health benefits.  Behavioral modification strategies: increasing lean protein intake, increasing high fiber foods, meal planning and cooking strategies, and planning for success.  [name] has agreed to follow-up with our clinic in 8 weeks. She was informed of the importance of frequent follow-up visits to maximize her success with intensive lifestyle modifications for her multiple health conditions.   Objective:   Blood pressure 121/76, pulse 64, temperature 98.3 F (36.8 C), height  (1.6 m), weight 234 lb (106.1 kg), SpO2 100 %. Body mass index is 41.45 kg/m.  General: Cooperative, alert, well developed, in no acute distress. HEENT: Conjunctivae and lids unremarkable. Cardiovascular: Regular rhythm.  Lungs: Normal work of breathing. Neurologic: No focal deficits.   Lab Results  Component Value Date   CREATININE 0.97 09/21/2022   BUN 19 09/21/2022   NA 141 09/21/2022   K 4.3 09/21/2022   CL 103 09/21/2022   CO2 22 09/21/2022   Lab Results  Component Value Date   ALT 13 09/21/2022   AST 20 09/21/2022   ALKPHOS 67 09/21/2022   BILITOT 0.7 09/21/2022   Lab Results  Component Value Date   HGBA1C 5.4 02/27/2019   HGBA1C 5.5 10/31/2018   Lab Results  Component Value Date   INSULIN 8.3 02/27/2019   INSULIN 12.3 10/31/2018   Lab Results  Component Value Date   TSH 2.53 03/17/2020   Lab Results  Component Value Date   CHOL 148  11/14/2021   HDL 58 11/14/2021   LDLCALC 82 11/14/2021   TRIG 33 11/14/2021   CHOLHDL 2.6 11/14/2021   Lab Results  Component Value Date   VD25OH 45.2 09/21/2022   VD25OH 32.7 09/29/2020   VD25OH 49.1 03/09/2020   Lab Results  Component Value Date   WBC 5.0 09/21/2022   HGB 14.1 09/21/2022   HCT 43.8 09/21/2022   MCV 94 09/21/2022   PLT 187 09/21/2022   No results found for: "IRON", "TIBC", "FERRITIN"  Attestation Statements:    Reviewed by clinician on day of visit: allergies, medications, problem list, medical history, surgical history, family history, social history, and previous encounter notes.  I, Malcolm Metro, RMA, am acting as transcriptionist for Reuben Likes, MD.  I have reviewed the above documentation for accuracy and completeness, and I agree with the above. - Reuben Likes, MD

## 2022-12-19 ENCOUNTER — Other Ambulatory Visit: Payer: Self-pay | Admitting: *Deleted

## 2022-12-19 DIAGNOSIS — M0579 Rheumatoid arthritis with rheumatoid factor of multiple sites without organ or systems involvement: Secondary | ICD-10-CM

## 2022-12-19 DIAGNOSIS — Z79899 Other long term (current) drug therapy: Secondary | ICD-10-CM

## 2022-12-26 DIAGNOSIS — M0579 Rheumatoid arthritis with rheumatoid factor of multiple sites without organ or systems involvement: Secondary | ICD-10-CM | POA: Diagnosis not present

## 2022-12-26 DIAGNOSIS — Z79899 Other long term (current) drug therapy: Secondary | ICD-10-CM | POA: Diagnosis not present

## 2022-12-27 ENCOUNTER — Ambulatory Visit (INDEPENDENT_AMBULATORY_CARE_PROVIDER_SITE_OTHER): Payer: PPO | Admitting: Family Medicine

## 2022-12-27 DIAGNOSIS — M810 Age-related osteoporosis without current pathological fracture: Secondary | ICD-10-CM | POA: Diagnosis not present

## 2022-12-27 DIAGNOSIS — M0579 Rheumatoid arthritis with rheumatoid factor of multiple sites without organ or systems involvement: Secondary | ICD-10-CM | POA: Diagnosis not present

## 2022-12-27 DIAGNOSIS — E785 Hyperlipidemia, unspecified: Secondary | ICD-10-CM

## 2022-12-27 DIAGNOSIS — M5136 Other intervertebral disc degeneration, lumbar region: Secondary | ICD-10-CM | POA: Diagnosis not present

## 2022-12-27 LAB — CBC WITH DIFFERENTIAL/PLATELET
Basophils Absolute: 0 10*3/uL (ref 0.0–0.2)
Basos: 0 %
EOS (ABSOLUTE): 0.1 10*3/uL (ref 0.0–0.4)
Eos: 1 %
Hematocrit: 39.5 % (ref 34.0–46.6)
Hemoglobin: 13 g/dL (ref 11.1–15.9)
Immature Grans (Abs): 0 10*3/uL (ref 0.0–0.1)
Immature Granulocytes: 0 %
Lymphocytes Absolute: 1.8 10*3/uL (ref 0.7–3.1)
Lymphs: 36 %
MCH: 30.6 pg (ref 26.6–33.0)
MCHC: 32.9 g/dL (ref 31.5–35.7)
MCV: 93 fL (ref 79–97)
Monocytes Absolute: 0.5 10*3/uL (ref 0.1–0.9)
Monocytes: 9 %
Neutrophils Absolute: 2.6 10*3/uL (ref 1.4–7.0)
Neutrophils: 54 %
Platelets: 208 10*3/uL (ref 150–450)
RBC: 4.25 x10E6/uL (ref 3.77–5.28)
RDW: 13.6 % (ref 11.7–15.4)
WBC: 5 10*3/uL (ref 3.4–10.8)

## 2022-12-27 LAB — CMP14+EGFR
ALT: 16 IU/L (ref 0–32)
AST: 21 IU/L (ref 0–40)
Albumin/Globulin Ratio: 1.9 (ref 1.2–2.2)
Albumin: 4.4 g/dL (ref 3.8–4.8)
Alkaline Phosphatase: 57 IU/L (ref 44–121)
BUN/Creatinine Ratio: 16 (ref 12–28)
BUN: 12 mg/dL (ref 8–27)
Bilirubin Total: 0.8 mg/dL (ref 0.0–1.2)
CO2: 22 mmol/L (ref 20–29)
Calcium: 10.3 mg/dL (ref 8.7–10.3)
Chloride: 103 mmol/L (ref 96–106)
Creatinine, Ser: 0.76 mg/dL (ref 0.57–1.00)
Globulin, Total: 2.3 g/dL (ref 1.5–4.5)
Glucose: 97 mg/dL (ref 70–99)
Potassium: 4.9 mmol/L (ref 3.5–5.2)
Sodium: 141 mmol/L (ref 134–144)
Total Protein: 6.7 g/dL (ref 6.0–8.5)
eGFR: 84 mL/min/{1.73_m2} (ref >59)

## 2022-12-27 MED ORDER — TRAMADOL HCL 50 MG PO TABS: ORAL_TABLET | ORAL | 3 refills | Status: DC

## 2022-12-27 NOTE — Progress Notes (Signed)
CBC and CMP are normal.

## 2022-12-27 NOTE — Assessment & Plan Note (Signed)
Stable.  Continue Amaryl.  Tramadol as needed.

## 2022-12-27 NOTE — Assessment & Plan Note (Signed)
Tolerating Fosamax. Continue.  

## 2022-12-27 NOTE — Progress Notes (Signed)
Subjective:  Patient ID: [name], female    DOB: Dec 01, 1950  Age: 72 y.o. MRN: [id number]  CC: Chief Complaint  Patient presents with   Hypertension    HPI:  72 year old female with mild aortic stenosis, rheumatoid arthritis, osteoporosis, hyperlipidemia presents for follow-up  Patient states that overall she is doing well.  Petechiae has improved but is still present.  Rheumatoid arthritis stable on Enbrel.  Patient needs refill on her tramadol regarding her pain.  Patient needs lipid panel to assess.  She is currently on Zetia and a max dose Lipitor.  Patient tolerating Fosamax.  Patient Active Problem List   Diagnosis Date Noted   Coronary atherosclerosis 10/25/2022   Aortic atherosclerosis (HCC) 10/25/2022   Petechiae 10/25/2022   Mild aortic stenosis 06/29/2022   Osteoporosis 06/28/2022   Thigh numbness 04/14/2022   Anxiety 06/28/2021   GERD (gastroesophageal reflux disease) 06/28/2021   History of total knee replacement, bilateral 10/30/2018   Seropositive rheumatoid arthritis of multiple sites (HCC) 07/20/2014   Obesity (BMI 30-39.9) 12/19/2007   DDD (degenerative disc disease), lumbar 06/27/2007   Hyperlipidemia 02/21/2007    Social Hx   Social History   Socioeconomic History   Marital status: Married    Spouse name: Nilam Eastling   Number of children: 4   Years of education: Not on file   Highest education level: Some college, no degree  Occupational History   Occupation: Stay at home  Tobacco Use   Smoking status: Never    Passive exposure: Never   Smokeless tobacco: Never  Vaping Use   Vaping Use: Never used  Substance and Sexual Activity   Alcohol use: Yes    Comment: sometimes; rarely   Drug use: No   Sexual activity: Yes    Birth control/protection: Post-menopausal  Other Topics Concern   Not on file  Social History Narrative   Not on file   Social Determinants of Health   Financial Resource Strain: Low Risk  (12/23/2022)    Overall Financial Resource Strain (CARDIA)    Difficulty of Paying Living Expenses: Not hard at all  Food Insecurity: No Food Insecurity (12/23/2022)   Hunger Vital Sign    Worried About Running Out of Food in the Last Year: Never true    Ran Out of Food in the Last Year: Never true  Transportation Needs: No Transportation Needs (12/23/2022)   PRAPARE - Administrator, Civil Service (Medical): No    Lack of Transportation (Non-Medical): No  Physical Activity: Sufficiently Active (12/23/2022)   Exercise Vital Sign    Days of Exercise per Week: 5 days    Minutes of Exercise per Session: 60 min  Stress: Stress Concern Present (12/23/2022)   [employer] of Occupational Health - Occupational Stress Questionnaire    Feeling of Stress : To some extent  Social Connections: Socially Integrated (12/23/2022)   Social Connection and Isolation Panel [NHANES]    Frequency of Communication with Friends and Family: More than three times a week    Frequency of Social Gatherings with Friends and Family: More than three times a week    Attends Religious Services: More than 4 times per year    Active Member of [employer] or Organizations: Yes    Attends Engineer, structural: More than 4 times per year    Marital Status: Married    Review of Systems Per HPI  Objective:  BP 132/82   Ht 5\' 3"  (1.6 m)   Wt  235 lb (106.6 kg)   BMI 41.63 kg/m      12/27/2022    8:33 AM 12/11/2022    2:00 PM 12/01/2022    8:40 AM  BP/Weight  Systolic BP 132 121   Diastolic BP 82 76   Wt. (Lbs) 235 234 234  BMI 41.63 kg/m2 41.45 kg/m2 41.45 kg/m2    Physical Exam Constitutional:      General: She is not in acute distress.    Appearance: Normal appearance.  HENT:     Head: Normocephalic and atraumatic.  Cardiovascular:     Rate and Rhythm: Normal rate and regular rhythm.     Heart sounds: Murmur heard.  Pulmonary:     Effort: Pulmonary effort is normal.     Breath sounds: Normal breath sounds.  No wheezing, rhonchi or rales.  Skin:    Comments: Petechiae noted on the lower extremities.  Neurological:     Mental Status: She is alert.  Psychiatric:        Mood and Affect: Mood normal.        Behavior: Behavior normal.     Lab Results  Component Value Date   WBC 5.0 12/26/2022   HGB 13.0 12/26/2022   HCT 39.5 12/26/2022   PLT 208 12/26/2022   GLUCOSE 97 12/26/2022   CHOL 148 11/14/2021   TRIG 33 11/14/2021   HDL 58 11/14/2021   LDLCALC 82 11/14/2021   ALT 16 12/26/2022   AST 21 12/26/2022   NA 141 12/26/2022   K 4.9 12/26/2022   CL 103 12/26/2022   CREATININE 0.76 12/26/2022   BUN 12 12/26/2022   CO2 22 12/26/2022   TSH 2.53 03/17/2020   INR 1.75 (H) 03/03/2010   HGBA1C 5.4 02/27/2019     Assessment & Plan:   Problem List Items Addressed This Visit       Musculoskeletal and Integument   DDD (degenerative disc disease), lumbar    Tramadol refilled.      Relevant Medications   traMADol (ULTRAM) 50 MG tablet   Osteoporosis    Tolerating Fosamax.  Continue.      Seropositive rheumatoid arthritis of multiple sites (HCC)    Stable.  Continue Amaryl.  Tramadol as needed.      Relevant Medications   traMADol (ULTRAM) 50 MG tablet     Other   Hyperlipidemia    Lipid panel today to assess.  Continue Zetia and Lipitor.      Relevant Orders   Lipid panel    Meds ordered this encounter  Medications   traMADol (ULTRAM) 50 MG tablet    Sig: TAKE 1-2 TABLETS EVERY 8 (EIGHT) HOURS AS NEEDED FOR MODERATE PAIN OR SEVERE PAIN.    Dispense:  30 tablet    Refill:  3    Not to exceed 5 additional fills before 10/11/2022    Follow-up:  6 months  Zaylia Riolo Adriana Simas DO [facility]

## 2022-12-27 NOTE — Patient Instructions (Signed)
Lipid panel at your leisure.  Follow up in 6 months.  Take care  Dr. Adriana Simas

## 2022-12-27 NOTE — Assessment & Plan Note (Signed)
Lipid panel today to assess.  Continue Zetia and Lipitor.

## 2022-12-27 NOTE — Assessment & Plan Note (Signed)
Tramadol refilled.

## 2023-01-17 DIAGNOSIS — E785 Hyperlipidemia, unspecified: Secondary | ICD-10-CM | POA: Diagnosis not present

## 2023-01-18 LAB — LIPID PANEL
Chol/HDL Ratio: 2.9 ratio (ref 0.0–4.4)
Cholesterol, Total: 152 mg/dL (ref 100–199)
HDL: 52 mg/dL (ref >39)
LDL Chol Calc (NIH): 89 mg/dL (ref 0–99)
Triglycerides: 50 mg/dL (ref 0–149)
VLDL Cholesterol Cal: 11 mg/dL (ref 5–40)

## 2023-01-19 ENCOUNTER — Ambulatory Visit (INDEPENDENT_AMBULATORY_CARE_PROVIDER_SITE_OTHER): Payer: PPO | Admitting: Family Medicine

## 2023-01-19 ENCOUNTER — Encounter: Payer: Self-pay | Admitting: Family Medicine

## 2023-01-19 ENCOUNTER — Other Ambulatory Visit: Payer: Self-pay | Admitting: Family Medicine

## 2023-01-19 VITALS — BP 113/75 | HR 63 | Temp 97.7°F | Ht 63.0 in | Wt 235.0 lb

## 2023-01-19 DIAGNOSIS — R609 Edema, unspecified: Secondary | ICD-10-CM | POA: Diagnosis not present

## 2023-01-19 DIAGNOSIS — F419 Anxiety disorder, unspecified: Secondary | ICD-10-CM

## 2023-01-19 NOTE — Progress Notes (Signed)
Subjective:  Patient ID: [name], female    DOB: [birth year]  Age: 72 y.o. MRN: [id number]  CC: Chief Complaint  Patient presents with   possible lymphedema    HPI:  72 year old female presents for evaluation of the above.  Patient has ongoing pain and numbness of the lateral thighs.  We have evaluated the lumbar spine and she has seen a neurosurgeon.  He does not think that this is related to the lumbar spine.  Patient is concerned that she may have lymphedema or lipedema.  She states that she has had issues with obesity since she was a child.  She states that her fat distribution is always around the thighs and buttocks, particular the thighs.  She has done some research and would like to discuss lymphedema and lipedema today.  Patient Active Problem List   Diagnosis Date Noted   Lipedema 01/19/2023   Coronary atherosclerosis 10/25/2022   Aortic atherosclerosis (HCC) 10/25/2022   Petechiae 10/25/2022   Mild aortic stenosis 06/29/2022   Osteoporosis 06/28/2022   Thigh numbness 04/14/2022   Anxiety 06/28/2021   GERD (gastroesophageal reflux disease) 06/28/2021   History of total knee replacement, bilateral 10/30/2018   Seropositive rheumatoid arthritis of multiple sites (HCC) 07/20/2014   Obesity (BMI 30-39.9) 12/19/2007   DDD (degenerative disc disease), lumbar 06/27/2007   Hyperlipidemia 02/21/2007    Social Hx   Social History   Socioeconomic History   Marital status: Married    Spouse name: Darshay Chirdon   Number of children: 4   Years of education: Not on file   Highest education level: Some college, no degree  Occupational History   Occupation: Stay at home  Tobacco Use   Smoking status: Never    Passive exposure: Never   Smokeless tobacco: Never  Vaping Use   Vaping Use: Never used  Substance and Sexual Activity   Alcohol use: Yes    Comment: sometimes; rarely   Drug use: No   Sexual activity: Yes    Birth control/protection: Post-menopausal   Other Topics Concern   Not on file  Social History Narrative   Not on file   Social Determinants of Health   Financial Resource Strain: Low Risk  (12/23/2022)   Overall Financial Resource Strain (CARDIA)    Difficulty of Paying Living Expenses: Not hard at all  Food Insecurity: No Food Insecurity (12/23/2022)   Hunger Vital Sign    Worried About Running Out of Food in the Last Year: Never true    Ran Out of Food in the Last Year: Never true  Transportation Needs: No Transportation Needs (12/23/2022)   PRAPARE - Administrator, Civil Service (Medical): No    Lack of Transportation (Non-Medical): No  Physical Activity: Sufficiently Active (12/23/2022)   Exercise Vital Sign    Days of Exercise per Week: 5 days    Minutes of Exercise per Session: 60 min  Stress: Stress Concern Present (12/23/2022)   [employer] of Occupational Health - Occupational Stress Questionnaire    Feeling of Stress : To some extent  Social Connections: Socially Integrated (12/23/2022)   Social Connection and Isolation Panel [NHANES]    Frequency of Communication with Friends and Family: More than three times a week    Frequency of Social Gatherings with Friends and Family: More than three times a week    Attends Religious Services: More than 4 times per year    Active Member of [employer] or Organizations: Yes  Attends Banker Meetings: More than 4 times per year    Marital Status: Married    Review of Systems Per HPI  Objective:  BP 113/75   Pulse 63   Temp 97.7 F (36.5 C)   Ht 5\' 3"  (1.6 m)   Wt 235 lb (106.6 kg)   SpO2 97%   BMI 41.63 kg/m      01/19/2023   10:49 AM 12/27/2022    8:33 AM 12/11/2022    2:00 PM  BP/Weight  Systolic BP 113 132 121  Diastolic BP 75 82 76  Wt. (Lbs) 235 235 234  BMI 41.63 kg/m2 41.63 kg/m2 41.45 kg/m2    Physical Exam Vitals and nursing note reviewed.  Constitutional:      General: She is not in acute distress.    Appearance: Normal  appearance. She is obese.  HENT:     Head: Normocephalic and atraumatic.  Eyes:     General:        Right eye: No discharge.        Left eye: No discharge.     Conjunctiva/sclera: Conjunctivae normal.  Musculoskeletal:     Comments: Large thighs noted.  No significant swelling of the lower extremities (below the knee).   Neurological:     Mental Status: She is alert.  Psychiatric:        Mood and Affect: Mood normal.        Behavior: Behavior normal.     Lab Results  Component Value Date   WBC 5.0 12/26/2022   HGB 13.0 12/26/2022   HCT 39.5 12/26/2022   PLT 208 12/26/2022   GLUCOSE 97 12/26/2022   CHOL 152 01/17/2023   TRIG 50 01/17/2023   HDL 52 01/17/2023   LDLCALC 89 01/17/2023   ALT 16 12/26/2022   AST 21 12/26/2022   NA 141 12/26/2022   K 4.9 12/26/2022   CL 103 12/26/2022   CREATININE 0.76 12/26/2022   BUN 12 12/26/2022   CO2 22 12/26/2022   TSH 2.53 03/17/2020   INR 1.75 (H) 03/03/2010   HGBA1C 5.4 02/27/2019     Assessment & Plan:   Problem List Items Addressed This Visit       Musculoskeletal and Integument   Lipedema - Primary    I would leave that she most likely has lipedema. We discussed weight loss.  She is working on this. We also discussed consult with plastic surgery.  She will consider.       Everlene Other DO [facility]

## 2023-01-19 NOTE — Assessment & Plan Note (Signed)
I would leave that she most likely has lipedema. We discussed weight loss.  She is working on this. We also discussed consult with plastic surgery.  She will consider.

## 2023-02-05 ENCOUNTER — Encounter (INDEPENDENT_AMBULATORY_CARE_PROVIDER_SITE_OTHER): Payer: Self-pay | Admitting: Family Medicine

## 2023-02-05 ENCOUNTER — Ambulatory Visit (INDEPENDENT_AMBULATORY_CARE_PROVIDER_SITE_OTHER): Payer: PPO | Admitting: Family Medicine

## 2023-02-05 VITALS — BP 143/79 | HR 77 | Temp 98.1°F | Ht 63.0 in | Wt 230.0 lb

## 2023-02-05 DIAGNOSIS — E559 Vitamin D deficiency, unspecified: Secondary | ICD-10-CM

## 2023-02-05 DIAGNOSIS — E7849 Other hyperlipidemia: Secondary | ICD-10-CM | POA: Diagnosis not present

## 2023-02-05 DIAGNOSIS — Z6841 Body Mass Index (BMI) 40.0 and over, adult: Secondary | ICD-10-CM

## 2023-02-05 DIAGNOSIS — E669 Obesity, unspecified: Secondary | ICD-10-CM | POA: Diagnosis not present

## 2023-02-05 NOTE — Progress Notes (Unsigned)
Chief Complaint:   OBESITY [name] is here to discuss her progress with her obesity treatment plan along with follow-up of her obesity related diagnoses. [name] is on keeping a food journal and adhering to recommended goals of 1200 calories and 80+ grams of protein and states she is following her eating plan approximately 25% of the time. [name] states she is doing 0 minutes 0 times per week.  Today's visit was #: 41 Starting weight: 267 lbs Starting date: 10/31/2018 Today's weight: 230 lbs Today's date: 02/05/2023 Total lbs lost to date: 37 Total lbs lost since last in-office visit: 4  Interim History: Patient saw her PCP at the end of May to discuss lipedema versus lymphedema.  She is scheduled to get a lymphatic tissue massage tomorrow.  She did discuss plastic surgery with her PCP and would like to no examine that option.  She is doing mostly plants and whole grains with minimal focus on protein (getting 50-60g of protein a day).  She isn't much of a protein shake drinker.  She is planning a road trip to see her kids and grandkids in mid July. She started taking art classes to help with her pain.  Subjective:   1. Vitamin D deficiency Patient is on OTC vitamin D 5000 IU daily.  She denies nausea, vomiting, or muscle weakness but notes fatigue.  2. Other hyperlipidemia Patient is on Lipitor 80 mg daily.  Her last LDL was 89, triglycerides 50, and HDL 52.  She notes some myalgias but no transaminitis.  Assessment/Plan:   1. Vitamin D deficiency Patient will continue OTC vitamin D.  Need labs done in September/October to check vitamin D level.  2. Other hyperlipidemia Patient will continue Lipitor with no change in dose; labs at goal.  3. BMI 40.0-44.9, adult (HCC)  4. Obesity with starting BMI of 47.3 [name] is currently in the action stage of change. As such, her goal is to continue with weight loss efforts. She has agreed to keeping a food journal and adhering to  recommended goals of 1200 calories and 80+ grams of protein daily.   Exercise goals: No exercise has been prescribed at this time.  Behavioral modification strategies: increasing lean protein intake, meal planning and cooking strategies, keeping healthy foods in the home, and planning for success.  [name] has agreed to follow-up with our clinic in 8 to 10 weeks. She was informed of the importance of frequent follow-up visits to maximize her success with intensive lifestyle modifications for her multiple health conditions.   Objective:   Blood pressure (!) 143/79, pulse 77, temperature 98.1 F (36.7 C), height 5\' 3"  (1.6 m), weight 230 lb (104.3 kg), SpO2 96 %. Body mass index is 40.74 kg/m.  General: Cooperative, alert, well developed, in no acute distress. HEENT: Conjunctivae and lids unremarkable. Cardiovascular: Regular rhythm.  Lungs: Normal work of breathing. Neurologic: No focal deficits.   Lab Results  Component Value Date   CREATININE 0.76 12/26/2022   BUN 12 12/26/2022   NA 141 12/26/2022   K 4.9 12/26/2022   CL 103 12/26/2022   CO2 22 12/26/2022   Lab Results  Component Value Date   ALT 16 12/26/2022   AST 21 12/26/2022   ALKPHOS 57 12/26/2022   BILITOT 0.8 12/26/2022   Lab Results  Component Value Date   HGBA1C 5.4 02/27/2019   HGBA1C 5.5 10/31/2018   Lab Results  Component Value Date   INSULIN 8.3 02/27/2019   INSULIN 12.3 10/31/2018  Lab Results  Component Value Date   TSH 2.53 03/17/2020   Lab Results  Component Value Date   CHOL 152 01/17/2023   HDL 52 01/17/2023   LDLCALC 89 01/17/2023   TRIG 50 01/17/2023   CHOLHDL 2.9 01/17/2023   Lab Results  Component Value Date   VD25OH 45.2 09/21/2022   VD25OH 32.7 09/29/2020   VD25OH 49.1 03/09/2020   Lab Results  Component Value Date   WBC 5.0 12/26/2022   HGB 13.0 12/26/2022   HCT 39.5 12/26/2022   MCV 93 12/26/2022   PLT 208 12/26/2022   No results found for: "IRON", "TIBC",  "FERRITIN"  Attestation Statements:   Reviewed by clinician on day of visit: allergies, medications, problem list, medical history, surgical history, family history, social history, and previous encounter notes.   I, Burt Knack, am acting as transcriptionist for Reuben Likes, MD.  I have reviewed the above documentation for accuracy and completeness, and I agree with the above. - Reuben Likes, MD

## 2023-02-07 ENCOUNTER — Encounter: Payer: Self-pay | Admitting: Rheumatology

## 2023-02-07 ENCOUNTER — Other Ambulatory Visit: Payer: Self-pay | Admitting: *Deleted

## 2023-02-07 DIAGNOSIS — M0579 Rheumatoid arthritis with rheumatoid factor of multiple sites without organ or systems involvement: Secondary | ICD-10-CM

## 2023-02-07 MED ORDER — ENBREL MINI 50 MG/ML ~~LOC~~ SOCT: 50.0000 mg | SUBCUTANEOUS | 0 refills | Status: DC

## 2023-02-07 NOTE — Telephone Encounter (Signed)
Attempted to contact the patient and left message for patient to call the office.  

## 2023-02-07 NOTE — Telephone Encounter (Signed)
Refill request received via fax from [employer] for Enbrel   Last Fill: 10/12/2022  Labs: 12/26/2022 CBC and CMP are normal.   TB Gold: 03/09/2022   Next Visit: 03/16/2023  Last Visit: 9/19/20243  [id number] rheumatoid arthritis of multiple sites   Current Dose per office note 05/09/2022: Enbrel 50 mg sq injections once every 10 days   Okay to refill Enbrel?

## 2023-02-07 NOTE — Telephone Encounter (Signed)
Per patient's my chart message I am out of sync, but still taking it every 10-14 days.

## 2023-02-07 NOTE — Telephone Encounter (Signed)
Please clarify if she is injecting enbrel weekly or every 10 days

## 2023-03-02 NOTE — Progress Notes (Signed)
Office Visit Note  Patient: [name]             Date of Birth: 04/10/1951           MRN: [id number]             PCP: Tommie Sams, DO Referring: Tommie Sams, DO Visit Date: 03/16/2023 Occupation: @GUAROCC @  Subjective:  Medication management  History of Present Illness: [name] is a 72 y.o. female with seropositive rheumatoid arthritis, osteoarthritis, and degenerative disc disease.  She states that she has not had any episodes of joint swelling since the last visit.  She has been taking Enbrel 50 mg subcu every 10 days which has been working well for her.  She states that she was evaluated by her orthopedic surgeon and her knee joint replacements are doing well.  She continues to have some discomfort off-and-on in the trochanteric region.  She also has puffiness in her legs because of lymphedema.  She continues to have morning stiffness.  She denies any interruption in Enbrel injections since the last visit.    Activities of Daily Living:  Patient reports morning stiffness for several hours.   Patient Denies nocturnal pain.  Difficulty dressing/grooming: Denies Difficulty climbing stairs: Denies Difficulty getting out of chair: Denies Difficulty using hands for taps, buttons, cutlery, and/or writing: Reports  Review of Systems  Constitutional:  Positive for fatigue.  HENT:  Positive for mouth dryness. Negative for mouth sores.   Eyes:  Positive for dryness.  Respiratory:  Negative for cough and wheezing.   Cardiovascular:  Negative for chest pain and palpitations.  Gastrointestinal:  Negative for blood in stool, constipation and diarrhea.  Endocrine: Negative for increased urination.  Genitourinary:  Negative for involuntary urination.  Musculoskeletal:  Positive for myalgias, morning stiffness, muscle tenderness and myalgias. Negative for joint pain, joint pain, joint swelling and muscle weakness.  Skin:  Positive for rash and sensitivity to sunlight.  Negative for color change and hair loss.  Allergic/Immunologic: Positive for susceptible to infections.  Neurological:  Positive for dizziness and headaches.  Hematological:  Negative for swollen glands.  Psychiatric/Behavioral:  Positive for depressed mood. Negative for sleep disturbance. The patient is nervous/anxious.     PMFS History:  Patient Active Problem List   Diagnosis Date Noted   Lipedema 01/19/2023   Coronary atherosclerosis 10/25/2022   Aortic atherosclerosis (HCC) 10/25/2022   Petechiae 10/25/2022   Mild aortic stenosis 06/29/2022   Osteoporosis 06/28/2022   Thigh numbness 04/14/2022   Anxiety 06/28/2021   GERD (gastroesophageal reflux disease) 06/28/2021   History of total knee replacement, bilateral 10/30/2018   Seropositive rheumatoid arthritis of multiple sites (HCC) 07/20/2014   Obesity (BMI 30-39.9) 12/19/2007   DDD (degenerative disc disease), lumbar 06/27/2007   Hyperlipidemia 02/21/2007    Past Medical History:  Diagnosis Date   Allergic rhinitis    Allergy    Seasonal. Fall   Anxiety    Arthritis    Asthma    Constipation    Depression    Essential hypertension 04/04/2007   Gallbladder problem    Hematuria 08/25/2015   Hyperlipidemia    Hypertension    Impetigo    in nose per patient, dx by derm.   Obesity    Osteoarthritis    Osteoporosis 06/28/2022   Pelvic pain in female 08/25/2015   Rheumatoid arthritis (HCC)    Swallowing difficulty    Swelling     Family History  Problem Relation Age of  Onset   Heart attack Father    Alcoholism Father    Depression Father    Heart disease Father    Hyperlipidemia Father    Hypertension Father    Leukemia Mother    Hypertension Mother    Cancer Mother    Depression Mother    Early death Mother    Miscarriages / [place] Mother    Obesity Sister    Other Sister        breathing problems   Hepatitis C Sister    Arthritis Sister    Depression Sister    Colon cancer Sister    Squamous  cell carcinoma Sister    Emphysema Brother    Obesity Daughter    Polycystic ovary syndrome Daughter    Depression Daughter    Obesity Son    Other Son        knee problems   Cancer Maternal Grandmother        breast   Obesity Maternal Grandmother    Other Brother        aneursym   Arthritis Sister    Depression Sister    Squamous cell carcinoma Sister    Obesity Son    Asthma Son    Depression Son    Obesity Son    Diabetes Other        runs on dad's side of the family   Arthritis Sister    Cancer Sister    Depression Sister    Obesity Sister    Cancer Sister    Obesity Sister    Depression Son    Obesity Son    Depression Son    Obesity Son    Vision loss Maternal Aunt    Past Surgical History:  Procedure Laterality Date   BACK SURGERY     CHOLECYSTECTOMY     COLONOSCOPY  2007   sessile sigmoid polyp x 1; path: serrated adenoma   COLONOSCOPY N/A 10/12/2014   Procedure: COLONOSCOPY;  Surgeon: West Bali, MD;  Location: [facility];  Service: Endoscopy;  Laterality: N/A;   COLONOSCOPY N/A 03/05/2020   Procedure: COLONOSCOPY;  Surgeon: Corbin Ade, MD;  Location: [facility];  Service: Endoscopy;  Laterality: N/A;  9:30   EYE SURGERY  2019   Cataract   JOINT REPLACEMENT     BIL knee    KNEE ARTHROPLASTY     KNEE SURGERY     bilateral knee replacement   LIPOMA EXCISION     x 2   SPINE SURGERY  2008   Rod L4-L5   TONSILLECTOMY AND ADENOIDECTOMY     Social History   Social History Narrative   Not on file   Immunization History  Administered Date(s) Administered   COVID-19, mRNA, vaccine(Comirnaty)12 years and older 05/12/2022   Fluad Quad(high Dose 65+) 05/27/2020, 05/12/2022   Influenza Split 06/17/2013   Influenza Whole 06/27/2007, 05/08/2008   Influenza, High Dose Seasonal PF 06/14/2017, 06/18/2018   Influenza,inj,Quad PF,6+ Mos 07/12/2015   Influenza-Unspecified 06/14/2017, 06/18/2018, 05/11/2019, 05/14/2021   PFIZER(Purple  Top)SARS-COV-2 Vaccination 10/12/2019, 11/04/2019, 06/03/2020, 05/23/2021   PNEUMOCOCCAL CONJUGATE-20 12/26/2021, 06/05/2022   Pneumococcal Polysaccharide-23 02/18/1993, 06/27/2007   Zoster Recombinant(Shingrix) 08/19/2018, 06/05/2022     Objective: Vital Signs: BP 122/74 (BP Location: Left Wrist, Patient Position: Sitting, Cuff Size: Normal)   Pulse 66   Resp 16   Ht 5\' 3"  (1.6 m)   Wt 239 lb 3.2 oz (108.5 kg)   BMI 42.37 kg/m  Physical Exam Vitals and nursing note reviewed.  Constitutional:      Appearance: She is well-developed.  HENT:     Head: Normocephalic and atraumatic.  Eyes:     Conjunctiva/sclera: Conjunctivae normal.  Cardiovascular:     Rate and Rhythm: Normal rate and regular rhythm.     Heart sounds: Normal heart sounds.  Pulmonary:     Effort: Pulmonary effort is normal.     Breath sounds: Normal breath sounds.  Abdominal:     General: Bowel sounds are normal.     Palpations: Abdomen is soft.  Musculoskeletal:     Cervical back: Normal range of motion.     Right lower leg: Edema present.     Left lower leg: Edema present.  Lymphadenopathy:     Cervical: No cervical adenopathy.  Skin:    General: Skin is warm and dry.     Capillary Refill: Capillary refill takes less than 2 seconds.     Comments: Fine petechiae over bilateral lower extremities consistent with Shamburger's purpura was noted.  Neurological:     Mental Status: She is alert and oriented to person, place, and time.  Psychiatric:        Behavior: Behavior normal.      Musculoskeletal Exam: She had limited lateral rotation of the cervical spine without discomfort.  Thoracic kyphosis was noted.  There was no tenderness over lumbar spine.  Shoulder joints and elbow joints in good range of motion.  She is ulnar deviation in bilateral hands with MCP thickening.  No synovitis was noted.  PIP and DIP prominence was noted.  Hip joints and knee joints were in good range of motion.  Bilateral knee  joints were replaced.  There was no tenderness over ankles.  She had bilateral hammertoes.  CDAI Exam: CDAI Score: -- Patient Global: 10 / 100; Provider Global: 10 / 100 Swollen: --; Tender: -- Joint Exam 03/16/2023   No joint exam has been documented for this visit   There is currently no information documented on the homunculus. Go to the Rheumatology activity and complete the homunculus joint exam.  Investigation: No additional findings.  Imaging: No results found.  Recent Labs: Lab Results  Component Value Date   WBC 5.0 12/26/2022   HGB 13.0 12/26/2022   PLT 208 12/26/2022   NA 141 12/26/2022   K 4.9 12/26/2022   CL 103 12/26/2022   CO2 22 12/26/2022   GLUCOSE 97 12/26/2022   BUN 12 12/26/2022   CREATININE 0.76 12/26/2022   BILITOT 0.8 12/26/2022   ALKPHOS 57 12/26/2022   AST 21 12/26/2022   ALT 16 12/26/2022   PROT 6.7 12/26/2022   ALBUMIN 4.4 12/26/2022   CALCIUM 10.3 12/26/2022   GFRAA 73 07/23/2020   QFTBGOLDPLUS Negative 03/09/2022    Speciality Comments: PLQ Eye Exam: 12/03/2019 WNL @ [facility] Follow up in 1 year MTX -dcd 09/19 Enbrel start date 03/10/20  Procedures:  No procedures performed Allergies: Codeine, Cyclosporine, and Propylene glycol   Assessment / Plan:     Visit Diagnoses: Seropositive rheumatoid arthritis of multiple sites (HCC) - positive RF, positive anti-CCP. Previous patient of Dr. Kellie Simmering with multiple contractures, ultrasound of both hands on 10/30/18 revealed synovitis: She denies having a flare of rheumatoid arthritis since the last visit.  Her Enbrel dosing was spaced to every 10 days at the last visit which she has been tolerating well.  She denies any interruption in the treatment.  No synovitis was noted on the  examination today.  High risk medication use - Enbrel 50 mg sq injections once every 10 days-started July 2021. D/c MTX and PLQ.  Labs obtained on Dec 26, 2022 CBC and CMP were normal.  TB Gold was negative on March 17, 2022.  She was advised to get labs in August and every 3 months to monitor for drug toxicity.  Information about immunization was placed in the AVS.  She was advised to hold Enbrel if she develops an infection and resume after the infection resolves.  Annual skin examination to screen for skin cancer was advised while she is on Enbrel.  Use of sunscreen and sun protection was discussed.  Chronic left shoulder pain and chronic right shoulder pain-she gives history of intermittent discomfort in her shoulders.  She had good range of motion of bilateral shoulders today without any discomfort.  History of total knee replacement, bilateral-patient states that she was recently evaluated by her orthopedic surgeon and prosthesis were in good place.  Trochanteric bursitis of right hip -she gives history of intermittent discomfort.  She had good range of motion today.  Pedal edema-she has bilateral lower extremity edema.  She also had Shamburger's purpura.  Use of compression hose was discussed.  DDD (degenerative disc disease), lumbar - S/p fusion: She has chronic intermittent discomfort.  Osteopenia of multiple sites - DEXA 01/12/20: The BMD measured at Forearm Radius 33% is 0.555 g/cm2 with a T-score of -2.2.  May 23, 2022 DEXA scan T-score -2.5, BMD 0.534-3.8%.  Use of calcium rich diet and vitamin D was advised.  Need for regular exercise was discussed.  She is on Fosamax by her PCP.  Essential hypertension-blood pressure was normal today at 122/74.  History of hyperlipidemia-she is on Zetia and Lipitor.  Increased risk of heart disease with rheumatoid arthritis was discussed.  Dietary modifications and exercise was emphasized.  Other medical problems are listed as follows:  History of adenomatous polyp of colon  History of asthma  History of MRSA infection  History of depression  History of anxiety  Orders: Orders Placed This Encounter  Procedures   QuantiFERON-TB Gold Plus   No  orders of the defined types were placed in this encounter.    Follow-Up Instructions: Return in about 5 months (around 08/16/2023) for Rheumatoid arthritis.   Pollyann Savoy, MD  Note - This record has been created using Animal nutritionist.  Chart creation errors have been sought, but may not always  have been located. Such creation errors do not reflect on  the standard of medical care.

## 2023-03-16 ENCOUNTER — Encounter: Payer: Self-pay | Admitting: Rheumatology

## 2023-03-16 ENCOUNTER — Ambulatory Visit: Payer: PPO | Attending: Rheumatology | Admitting: Rheumatology

## 2023-03-16 VITALS — BP 122/74 | HR 66 | Resp 16 | Ht 63.0 in | Wt 239.2 lb

## 2023-03-16 DIAGNOSIS — Z86010 Personal history of colonic polyps: Secondary | ICD-10-CM

## 2023-03-16 DIAGNOSIS — M25512 Pain in left shoulder: Secondary | ICD-10-CM

## 2023-03-16 DIAGNOSIS — Z8614 Personal history of Methicillin resistant Staphylococcus aureus infection: Secondary | ICD-10-CM

## 2023-03-16 DIAGNOSIS — M0579 Rheumatoid arthritis with rheumatoid factor of multiple sites without organ or systems involvement: Secondary | ICD-10-CM

## 2023-03-16 DIAGNOSIS — M51369 Other intervertebral disc degeneration, lumbar region without mention of lumbar back pain or lower extremity pain: Secondary | ICD-10-CM

## 2023-03-16 DIAGNOSIS — M25511 Pain in right shoulder: Secondary | ICD-10-CM

## 2023-03-16 DIAGNOSIS — M7061 Trochanteric bursitis, right hip: Secondary | ICD-10-CM

## 2023-03-16 DIAGNOSIS — G8929 Other chronic pain: Secondary | ICD-10-CM

## 2023-03-16 DIAGNOSIS — Z79899 Other long term (current) drug therapy: Secondary | ICD-10-CM

## 2023-03-16 DIAGNOSIS — Z96653 Presence of artificial knee joint, bilateral: Secondary | ICD-10-CM | POA: Diagnosis not present

## 2023-03-16 DIAGNOSIS — M8589 Other specified disorders of bone density and structure, multiple sites: Secondary | ICD-10-CM | POA: Diagnosis not present

## 2023-03-16 DIAGNOSIS — I10 Essential (primary) hypertension: Secondary | ICD-10-CM

## 2023-03-16 DIAGNOSIS — M5136 Other intervertebral disc degeneration, lumbar region: Secondary | ICD-10-CM

## 2023-03-16 DIAGNOSIS — Z8639 Personal history of other endocrine, nutritional and metabolic disease: Secondary | ICD-10-CM | POA: Diagnosis not present

## 2023-03-16 DIAGNOSIS — Z8659 Personal history of other mental and behavioral disorders: Secondary | ICD-10-CM

## 2023-03-16 DIAGNOSIS — Z8709 Personal history of other diseases of the respiratory system: Secondary | ICD-10-CM

## 2023-03-16 DIAGNOSIS — R600 Localized edema: Secondary | ICD-10-CM

## 2023-03-16 DIAGNOSIS — Z860101 Personal history of adenomatous and serrated colon polyps: Secondary | ICD-10-CM

## 2023-03-16 NOTE — Patient Instructions (Addendum)
Standing Labs We placed an order today for your standing lab work.   Please have your standing labs drawn in August and every 3 months  Please have your labs drawn 2 weeks prior to your appointment so that the provider can discuss your lab results at your appointment, if possible.  Please note that you may see your imaging and lab results in MyChart before we have reviewed them. We will contact you once all results are reviewed. Please allow our office up to 72 hours to thoroughly review all of the results before contacting the office for clarification of your results.  WALK-IN LAB HOURS  Monday through Thursday from 8:00 am -12:30 pm and 1:00 pm-5:00 pm and Friday from 8:00 am-12:00 pm.  Patients with office visits requiring labs will be seen before walk-in labs.  You may encounter longer than normal wait times. Please allow additional time. Wait times may be shorter on  Monday and Thursday afternoons.  We do not book appointments for walk-in labs. We appreciate your patience and understanding with our staff.   Labs are drawn by [facility]. Please bring your co-pay at the time of your lab draw.  You may receive a bill from [facility] for your lab work.  Please note if you are on Hydroxychloroquine and and an order has been placed for a Hydroxychloroquine level,  you will need to have it drawn 4 hours or more after your last dose.  If you wish to have your labs drawn at another location, please call the office 24 hours in advance so we can fax the orders.  The office is located at [address], [address], [place], [place] [address]   If you have any questions regarding directions or hours of operation,  please call [phone].   As a reminder, please drink plenty of water prior to coming for your lab work. Thanks!   Vaccines You are taking a medication(s) that can suppress your immune system.  The following immunizations are recommended: Flu annually Covid-19  RSV Td/Tdap (tetanus,  diphtheria, pertussis) every 10 years Pneumonia (Prevnar 15 then Pneumovax 23 at least 1 year apart.  Alternatively, can take Prevnar 20 without needing additional dose) Shingrix: 2 doses from 4 weeks to 6 months apart If you have signs or symptoms of an infection or start antibiotics: First, call your PCP for workup of your infection. Hold your medication through the infection, until you complete your antibiotics, and until symptoms resolve if you take the following: Injectable medication (Actemra, Benlysta, Cimzia, Cosentyx, Enbrel, Humira, Kevzara, Orencia, Remicade, Simponi, Stelara, Taltz, Tremfya) Methotrexate Leflunomide (Arava) Mycophenolate (Cellcept) Osborne [place], or Rinvoq   Please check with your PCP to make sure you are up to date.    Get an annual skin examination to screen for skin cancer while you are on Enbrel.  Please use sunscreen and sun protection.

## 2023-04-16 ENCOUNTER — Other Ambulatory Visit: Payer: Self-pay | Admitting: Medical Genetics

## 2023-04-16 ENCOUNTER — Ambulatory Visit (INDEPENDENT_AMBULATORY_CARE_PROVIDER_SITE_OTHER): Payer: PPO | Admitting: Family Medicine

## 2023-04-16 DIAGNOSIS — Z006 Encounter for examination for normal comparison and control in clinical research program: Secondary | ICD-10-CM

## 2023-04-25 ENCOUNTER — Other Ambulatory Visit: Payer: Self-pay | Admitting: Family Medicine

## 2023-04-25 DIAGNOSIS — F419 Anxiety disorder, unspecified: Secondary | ICD-10-CM

## 2023-05-15 ENCOUNTER — Ambulatory Visit (INDEPENDENT_AMBULATORY_CARE_PROVIDER_SITE_OTHER): Payer: PPO | Admitting: Family Medicine

## 2023-05-16 ENCOUNTER — Ambulatory Visit (INDEPENDENT_AMBULATORY_CARE_PROVIDER_SITE_OTHER): Payer: PPO | Admitting: Family Medicine

## 2023-06-04 ENCOUNTER — Encounter: Payer: Self-pay | Admitting: Family Medicine

## 2023-06-05 ENCOUNTER — Encounter (INDEPENDENT_AMBULATORY_CARE_PROVIDER_SITE_OTHER): Payer: Self-pay | Admitting: Family Medicine

## 2023-06-05 ENCOUNTER — Telehealth (INDEPENDENT_AMBULATORY_CARE_PROVIDER_SITE_OTHER): Payer: PPO | Admitting: Family Medicine

## 2023-06-05 DIAGNOSIS — E669 Obesity, unspecified: Secondary | ICD-10-CM

## 2023-06-05 DIAGNOSIS — Z723 Lack of physical exercise: Secondary | ICD-10-CM | POA: Diagnosis not present

## 2023-06-05 DIAGNOSIS — Z6841 Body Mass Index (BMI) 40.0 and over, adult: Secondary | ICD-10-CM

## 2023-06-06 NOTE — Progress Notes (Signed)
TeleHealth Visit:  Due to the COVID-19 pandemic, this visit was completed with telemedicine (audio/video) technology to reduce patient and provider exposure as well as to preserve personal protective equipment.   [name] has verbally consented to this TeleHealth visit. The patient is located at home, the provider is located at the [facility] office. The participants in this visit include the listed provider and patient. The visit was conducted today via MyChart video.   Chief Complaint: OBESITY [name] is here to discuss her progress with her obesity treatment plan along with follow-up of her obesity related diagnoses. [name] is on keeping a food journal and adhering to recommended goals of 1200 calories and 80+ grams of protein and states she is following her eating plan approximately 65% of the time. [name] states she is doing 0 minutes 0 times per week.  Today's visit was #: 42 Starting weight: 267 lbs Starting date: 10/31/2018  Interim History: Patient has COVID currently.  She was away for 5 days at a women's retreat and got sick after.  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  She is teaching Bible study.  She is in counseling and she has been doing some emotional eating.  Subjective:   1. Lack of physical activity Patient has numerous activity commitments and a history of rheumatoid arthritis, so she is limited by what she can do.  She recognizes that she cannot go to many of the exercise classes that she was going to previously.  Assessment/Plan:   1. Lack of physical activity Patient is to work activity into her routine more consistently even and 5-minute spurts.  2. BMI 40.0-44.9, adult (HCC)  3. Obesity with starting BMI of 47.3 [name] is currently in the action stage of change. As such, her goal is to continue with weight loss efforts. She has agreed to keeping a food journal and adhering to recommended goals  of 1200 calories and 80+ grams of protein daily.   Exercise goals: All adults should avoid inactivity. Some physical activity is better than none, and adults who participate in any amount of physical activity gain some health benefits.  Behavioral modification strategies: increasing lean protein intake, meal planning and cooking strategies, keeping healthy foods in the home, and planning for success.  [name] has agreed to follow-up with our clinic in 8 weeks. She was informed of the importance of frequent follow-up visits to maximize her success with intensive lifestyle modifications for her multiple health conditions.  Objective:   VITALS: Per patient if applicable, see vitals. GENERAL: Alert and in no acute distress. CARDIOPULMONARY: No increased WOB. Speaking in clear sentences.  PSYCH: Pleasant and cooperative. Speech normal rate and rhythm. Affect is appropriate. Insight and judgement are appropriate. Attention is focused, linear, and appropriate.  NEURO: Oriented as arrived to appointment on time with no prompting.   Lab Results  Component Value Date   CREATININE 0.76 12/26/2022   BUN 12 12/26/2022   NA 141 12/26/2022   K 4.9 12/26/2022   CL 103 12/26/2022   CO2 22 12/26/2022   Lab Results  Component Value Date   ALT 16 12/26/2022   AST 21 12/26/2022   ALKPHOS 57 12/26/2022   BILITOT 0.8 12/26/2022   Lab Results  Component Value Date   HGBA1C 5.4 02/27/2019   HGBA1C 5.5 10/31/2018   Lab Results  Component Value Date   INSULIN 8.3 02/27/2019   INSULIN 12.3 10/31/2018  Lab Results  Component Value Date   TSH 2.53 03/17/2020   Lab Results  Component Value Date   CHOL 152 01/17/2023   HDL 52 01/17/2023   LDLCALC 89 01/17/2023   TRIG 50 01/17/2023   CHOLHDL 2.9 01/17/2023   Lab Results  Component Value Date   VD25OH 45.2 09/21/2022   VD25OH 32.7 09/29/2020   VD25OH 49.1 03/09/2020   Lab Results  Component Value Date   WBC 5.0 12/26/2022   HGB 13.0  12/26/2022   HCT 39.5 12/26/2022   MCV 93 12/26/2022   PLT 208 12/26/2022   No results found for: "IRON", "TIBC", "FERRITIN"  Attestation Statements:   Reviewed by clinician on day of visit: allergies, medications, problem list, medical history, surgical history, family history, social history, and previous encounter notes.   I, Burt Knack, am acting as transcriptionist for Reuben Likes, MD.  I have reviewed the above documentation for accuracy and completeness, and I agree with the above. - Reuben Likes, MD

## 2023-06-07 ENCOUNTER — Other Ambulatory Visit: Payer: Self-pay | Admitting: Family Medicine

## 2023-06-07 MED ORDER — BENZONATATE 200 MG PO CAPS: 200.0000 mg | ORAL_CAPSULE | Freq: Three times a day (TID) | ORAL | 0 refills | Status: DC | PRN

## 2023-06-15 ENCOUNTER — Other Ambulatory Visit (HOSPITAL_COMMUNITY): Payer: Self-pay | Attending: Medical Genetics

## 2023-06-28 ENCOUNTER — Ambulatory Visit (INDEPENDENT_AMBULATORY_CARE_PROVIDER_SITE_OTHER): Payer: PPO | Admitting: Family Medicine

## 2023-06-28 ENCOUNTER — Encounter: Payer: Self-pay | Admitting: Family Medicine

## 2023-06-28 VITALS — BP 160/92 | HR 68 | Temp 96.8°F | Ht 63.0 in | Wt 240.0 lb

## 2023-06-28 DIAGNOSIS — I350 Nonrheumatic aortic (valve) stenosis: Secondary | ICD-10-CM | POA: Diagnosis not present

## 2023-06-28 DIAGNOSIS — F419 Anxiety disorder, unspecified: Secondary | ICD-10-CM | POA: Diagnosis not present

## 2023-06-28 DIAGNOSIS — E7849 Other hyperlipidemia: Secondary | ICD-10-CM

## 2023-06-28 DIAGNOSIS — R030 Elevated blood-pressure reading, without diagnosis of hypertension: Secondary | ICD-10-CM | POA: Diagnosis not present

## 2023-06-28 DIAGNOSIS — R233 Spontaneous ecchymoses: Secondary | ICD-10-CM | POA: Diagnosis not present

## 2023-06-28 MED ORDER — ALENDRONATE SODIUM 70 MG PO TABS: 70.0000 mg | ORAL_TABLET | ORAL | 3 refills | Status: DC

## 2023-06-28 MED ORDER — ATORVASTATIN CALCIUM 80 MG PO TABS: 80.0000 mg | ORAL_TABLET | Freq: Every day | ORAL | 3 refills | Status: DC

## 2023-06-28 MED ORDER — BUSPIRONE HCL 7.5 MG PO TABS: 7.5000 mg | ORAL_TABLET | Freq: Two times a day (BID) | ORAL | 3 refills | Status: DC

## 2023-06-28 NOTE — Assessment & Plan Note (Signed)
Advised to monitor her blood pressures at home.  Has no history of hypertension.  Labs today.

## 2023-06-28 NOTE — Progress Notes (Signed)
Subjective:  Patient ID: [name], female    DOB: 1951-07-02  Age: 72 y.o. MRN: [id number]  CC:   Chief Complaint  Patient presents with   Hypertension   circulation concerns    Bump on left leg    covid 2 weeks ago     C/o fatigue and cough linguering    HPI:  72 year old female presents for follow-up.  Patient reports recent COVID-19.  Improved but still having some fatigue and some cough.  Patient reports increasing anxiety.  She attributes this to the recent election.  She is on BuSpar.  She would like to discuss an increase in her medication today.  Patient reports a small palpable area to the anterior left lower leg.  It is not tender.  No recollection of injury.  She would like me to examine this today.  Patient's blood pressure is elevated here today.  She has no history of hypertension.  She attributes this to stress and being in the office.  Patient needs routine labs.  Patient Active Problem List   Diagnosis Date Noted   Elevated BP without diagnosis of hypertension 06/28/2023   Lipedema 01/19/2023   Coronary atherosclerosis 10/25/2022   Aortic atherosclerosis (HCC) 10/25/2022   Petechiae 10/25/2022   Mild aortic stenosis 06/29/2022   Osteoporosis 06/28/2022   Thigh numbness 04/14/2022   Anxiety 06/28/2021   GERD (gastroesophageal reflux disease) 06/28/2021   History of total knee replacement, bilateral 10/30/2018   Seropositive rheumatoid arthritis of multiple sites (HCC) 07/20/2014   Obesity (BMI 30-39.9) 12/19/2007   DDD (degenerative disc disease), lumbar 06/27/2007   Hyperlipidemia 02/21/2007    Social Hx   Social History   Socioeconomic History   Marital status: Married    Spouse name: Sharunda Salmon   Number of children: 4   Years of education: Not on file   Highest education level: Master's degree (e.g., MA, MS, MEng, MEd, MSW, MBA)  Occupational History   Occupation: Stay at home  Tobacco Use   Smoking status: Never    Passive  exposure: Never   Smokeless tobacco: Never  Vaping Use   Vaping status: Never Used  Substance and Sexual Activity   Alcohol use: Yes    Comment: sometimes; rarely   Drug use: No   Sexual activity: Yes    Birth control/protection: Post-menopausal  Other Topics Concern   Not on file  Social History Narrative   Not on file   Social Determinants of Health   Financial Resource Strain: Low Risk  (06/24/2023)   Overall Financial Resource Strain (CARDIA)    Difficulty of Paying Living Expenses: Not hard at all  Food Insecurity: No Food Insecurity (06/24/2023)   Hunger Vital Sign    Worried About Running Out of Food in the Last Year: Never true    Ran Out of Food in the Last Year: Never true  Transportation Needs: No Transportation Needs (06/24/2023)   PRAPARE - Administrator, Civil Service (Medical): No    Lack of Transportation (Non-Medical): No  Physical Activity: Sufficiently Active (06/24/2023)   Exercise Vital Sign    Days of Exercise per Week: 3 days    Minutes of Exercise per Session: 50 min  Stress: Stress Concern Present (06/24/2023)   [employer] of Occupational Health - Occupational Stress Questionnaire    Feeling of Stress : Rather much  Social Connections: Socially Integrated (06/24/2023)   Social Connection and Isolation Panel [NHANES]    Frequency of Communication with  Friends and Family: More than three times a week    Frequency of Social Gatherings with Friends and Family: Once a week    Attends Religious Services: More than 4 times per year    Active Member of [employer] or Organizations: Yes    Attends Engineer, structural: More than 4 times per year    Marital Status: Married    Review of Systems Per HPI  Objective:  BP (!) 160/92   Pulse 68   Temp (!) 96.8 F (36 C)   Ht 5\' 3"  (1.6 m)   Wt 240 lb (108.9 kg)   SpO2 98%   BMI 42.51 kg/m      06/28/2023   10:17 AM 06/28/2023    9:16 AM 03/16/2023    8:04 AM  BP/Weight  Systolic  BP 160 696 122  Diastolic BP 92 70 74  Wt. (Lbs)  240 239.2  BMI  42.51 kg/m2 42.37 kg/m2    Physical Exam Vitals and nursing note reviewed.  Constitutional:      General: She is not in acute distress.    Appearance: Normal appearance. She is obese.  HENT:     Head: Normocephalic and atraumatic.  Eyes:     General:        Right eye: No discharge.        Left eye: No discharge.     Conjunctiva/sclera: Conjunctivae normal.  Cardiovascular:     Rate and Rhythm: Normal rate and regular rhythm.     Heart sounds: Murmur heard.  Musculoskeletal:     Comments: Left anterior lower leg, above the ankle with a palpable firm area.  Appears to be in the soft tissue.  Neurological:     Mental Status: She is alert.  Psychiatric:     Comments: Anxious.     Lab Results  Component Value Date   WBC 5.0 12/26/2022   HGB 13.0 12/26/2022   HCT 39.5 12/26/2022   PLT 208 12/26/2022   GLUCOSE 97 12/26/2022   CHOL 152 01/17/2023   TRIG 50 01/17/2023   HDL 52 01/17/2023   LDLCALC 89 01/17/2023   ALT 16 12/26/2022   AST 21 12/26/2022   NA 141 12/26/2022   K 4.9 12/26/2022   CL 103 12/26/2022   CREATININE 0.76 12/26/2022   BUN 12 12/26/2022   CO2 22 12/26/2022   TSH 2.53 03/17/2020   INR 1.75 (H) 03/03/2010   HGBA1C 5.4 02/27/2019     Assessment & Plan:   Problem List Items Addressed This Visit       Cardiovascular and Mediastinum   Mild aortic stenosis    Patient's last echo was in 2020.  Echo for reassessment.      Relevant Medications   atorvastatin (LIPITOR) 80 MG tablet   Other Relevant Orders   ECHOCARDIOGRAM COMPLETE     Other   Petechiae   Relevant Orders   CBC   Hyperlipidemia   Relevant Medications   atorvastatin (LIPITOR) 80 MG tablet   Other Relevant Orders   Lipid panel   Elevated BP without diagnosis of hypertension    Advised to monitor her blood pressures at home.  Has no history of hypertension.  Labs today.      Relevant Orders   CMP14+EGFR    Anxiety - Primary    Worsening/exacerbated recently.  Increasing BuSpar to 7.5 mg twice daily.      Relevant Medications   busPIRone (BUSPAR) 7.5 MG tablet    Meds  ordered this encounter  Medications   alendronate (FOSAMAX) 70 MG tablet    Sig: Take 1 tablet (70 mg total) by mouth every 7 (seven) days. Take with a full glass of water on an empty stomach.    Dispense:  12 tablet    Refill:  3   atorvastatin (LIPITOR) 80 MG tablet    Sig: Take 1 tablet (80 mg total) by mouth daily.    Dispense:  90 tablet    Refill:  3   busPIRone (BUSPAR) 7.5 MG tablet    Sig: Take 1 tablet (7.5 mg total) by mouth 2 (two) times daily.    Dispense:  180 tablet    Refill:  3    Follow-up:  Return in about 6 months (around 12/26/2023) for Follow up Chronic medical issues.  Everlene Other DO [facility]

## 2023-06-28 NOTE — Assessment & Plan Note (Signed)
Worsening/exacerbated recently.  Increasing BuSpar to 7.5 mg twice daily.

## 2023-06-28 NOTE — Assessment & Plan Note (Signed)
Patient's last echo was in 2020.  Echo for reassessment.

## 2023-06-28 NOTE — Patient Instructions (Signed)
Labs today.  Buspar increased.  Follow up in 6 months.  Take care  Dr. Adriana Simas

## 2023-07-11 ENCOUNTER — Other Ambulatory Visit: Payer: Self-pay | Admitting: Family Medicine

## 2023-07-12 ENCOUNTER — Ambulatory Visit (HOSPITAL_COMMUNITY)
Admission: RE | Admit: 2023-07-12 | Discharge: 2023-07-12 | Disposition: A | Discharge status: Home / Self Care | Payer: PPO | Source: Ambulatory Visit | Attending: Family Medicine | Admitting: Family Medicine

## 2023-07-12 ENCOUNTER — Ambulatory Visit: Payer: PPO | Admitting: Family Medicine

## 2023-07-12 VITALS — BP 163/81 | HR 61 | Temp 96.8°F | Ht 63.0 in | Wt 240.0 lb

## 2023-07-12 DIAGNOSIS — M51362 Other intervertebral disc degeneration, lumbar region with discogenic back pain and lower extremity pain: Secondary | ICD-10-CM | POA: Diagnosis not present

## 2023-07-12 DIAGNOSIS — M25551 Pain in right hip: Secondary | ICD-10-CM | POA: Insufficient documentation

## 2023-07-12 MED ORDER — PREDNISONE 10 MG PO TABS: ORAL_TABLET | ORAL | 0 refills | Status: DC

## 2023-07-12 NOTE — Assessment & Plan Note (Signed)
X-ray for evaluation.  Placing on prednisone taper.  Referral placed for physical therapy at patient request.

## 2023-07-12 NOTE — Progress Notes (Signed)
Subjective:  Patient ID: [name], female    DOB: 27-Jun-1951  Age: 72 y.o. MRN: [id number]  CC:   Chief Complaint  Patient presents with   right hip pain     To right groin and low back taking aleve , weakness in right leg/foot    HPI:  72 year old female presents for evaluation of the above.  Patient has had issues with her lumbar spine as she has had prior surgery.  She reports that since Sunday she has had pain in the low back.  She also has pain in the right groin and to the right pelvis.  No recent fall, trauma, injury.  She has used Aleve without resolution.  She states that she recently saw her podiatrist and there was concern for lower extremity weakness.  No other associated symptoms.  No other complaints.  Patient Active Problem List   Diagnosis Date Noted   Right hip pain 07/12/2023   Elevated BP without diagnosis of hypertension 06/28/2023   Lipedema 01/19/2023   Coronary atherosclerosis 10/25/2022   Aortic atherosclerosis (HCC) 10/25/2022   Petechiae 10/25/2022   Mild aortic stenosis 06/29/2022   Osteoporosis 06/28/2022   Thigh numbness 04/14/2022   Anxiety 06/28/2021   GERD (gastroesophageal reflux disease) 06/28/2021   History of total knee replacement, bilateral 10/30/2018   Seropositive rheumatoid arthritis of multiple sites (HCC) 07/20/2014   Obesity (BMI 30-39.9) 12/19/2007   DDD (degenerative disc disease), lumbar 06/27/2007   Hyperlipidemia 02/21/2007    Social Hx   Social History   Socioeconomic History   Marital status: Married    Spouse name: Angellica Sugiura   Number of children: 4   Years of education: Not on file   Highest education level: Master's degree (e.g., MA, MS, MEng, MEd, MSW, MBA)  Occupational History   Occupation: Stay at home  Tobacco Use   Smoking status: Never    Passive exposure: Never   Smokeless tobacco: Never  Vaping Use   Vaping status: Never Used  Substance and Sexual Activity   Alcohol use: Yes    Comment:  sometimes; rarely   Drug use: No   Sexual activity: Yes    Birth control/protection: Post-menopausal  Other Topics Concern   Not on file  Social History Narrative   Not on file   Social Determinants of Health   Financial Resource Strain: Low Risk  (06/24/2023)   Overall Financial Resource Strain (CARDIA)    Difficulty of Paying Living Expenses: Not hard at all  Food Insecurity: No Food Insecurity (06/24/2023)   Hunger Vital Sign    Worried About Running Out of Food in the Last Year: Never true    Ran Out of Food in the Last Year: Never true  Transportation Needs: No Transportation Needs (06/24/2023)   PRAPARE - Administrator, Civil Service (Medical): No    Lack of Transportation (Non-Medical): No  Physical Activity: Sufficiently Active (06/24/2023)   Exercise Vital Sign    Days of Exercise per Week: 3 days    Minutes of Exercise per Session: 50 min  Stress: Stress Concern Present (06/24/2023)   [employer] of Occupational Health - Occupational Stress Questionnaire    Feeling of Stress : Rather much  Social Connections: Socially Integrated (06/24/2023)   Social Connection and Isolation Panel [NHANES]    Frequency of Communication with Friends and Family: More than three times a week    Frequency of Social Gatherings with Friends and Family: Once a week  Attends Religious Services: More than 4 times per year    Active Member of Clubs or Organizations: Yes    Attends Engineer, structural: More than 4 times per year    Marital Status: Married    Review of Systems Per HPI  Objective:  BP (!) 163/81   Pulse 61   Temp (!) 96.8 F (36 C)   Ht 5\' 3"  (1.6 m)   Wt 240 lb (108.9 kg)   SpO2 99%   BMI 42.51 kg/m      07/12/2023    9:46 AM 06/28/2023   10:17 AM 06/28/2023    9:16 AM  BP/Weight  Systolic BP 163 160 165  Diastolic BP 81 92 70  Wt. (Lbs) 240  240  BMI 42.51 kg/m2  42.51 kg/m2    Physical Exam Vitals and nursing note reviewed.   Constitutional:      Appearance: Normal appearance. She is obese.  HENT:     Head: Normocephalic and atraumatic.  Cardiovascular:     Rate and Rhythm: Normal rate and regular rhythm.  Pulmonary:     Effort: Pulmonary effort is normal.     Breath sounds: Normal breath sounds.  Musculoskeletal:     Comments: Right leg/hip - + FADIR.  Neurological:     Mental Status: She is alert.     Lab Results  Component Value Date   WBC 5.0 12/26/2022   HGB 13.0 12/26/2022   HCT 39.5 12/26/2022   PLT 208 12/26/2022   GLUCOSE 97 12/26/2022   CHOL 152 01/17/2023   TRIG 50 01/17/2023   HDL 52 01/17/2023   LDLCALC 89 01/17/2023   ALT 16 12/26/2022   AST 21 12/26/2022   NA 141 12/26/2022   K 4.9 12/26/2022   CL 103 12/26/2022   CREATININE 0.76 12/26/2022   BUN 12 12/26/2022   CO2 22 12/26/2022   TSH 2.53 03/17/2020   INR 1.75 (H) 03/03/2010   HGBA1C 5.4 02/27/2019     Assessment & Plan:   Problem List Items Addressed This Visit       Musculoskeletal and Integument   DDD (degenerative disc disease), lumbar   Relevant Medications   predniSONE (DELTASONE) 10 MG tablet   Other Relevant Orders   Ambulatory referral to Physical Therapy     Other   Right hip pain - Primary    X-ray for evaluation.  Placing on prednisone taper.  Referral placed for physical therapy at patient request.      Relevant Orders   DG Hip Unilat W OR W/O Pelvis 2-3 Views Right   Ambulatory referral to Physical Therapy    Meds ordered this encounter  Medications   predniSONE (DELTASONE) 10 MG tablet    Sig: 50 mg daily x 2 days, then 40 mg daily x 2 days, then 30 mg daily x 2 days, then 20 mg daily x 2 days, then 10 mg daily x 2 days.    Dispense:  30 tablet    Refill:  0    Follow-up:  Return if symptoms worsen or fail to improve.  Everlene Other DO [facility]

## 2023-07-12 NOTE — Patient Instructions (Signed)
Steroid as prescribed.  Xray at the hospital.  Referral placed for PT.

## 2023-07-26 ENCOUNTER — Ambulatory Visit (INDEPENDENT_AMBULATORY_CARE_PROVIDER_SITE_OTHER): Payer: PPO | Admitting: Family Medicine

## 2023-07-26 ENCOUNTER — Encounter: Payer: Self-pay | Admitting: Family Medicine

## 2023-07-26 VITALS — BP 172/70 | HR 73 | Temp 97.7°F | Ht 63.0 in | Wt 238.0 lb

## 2023-07-26 DIAGNOSIS — Z6841 Body Mass Index (BMI) 40.0 and over, adult: Secondary | ICD-10-CM

## 2023-07-26 DIAGNOSIS — R030 Elevated blood-pressure reading, without diagnosis of hypertension: Secondary | ICD-10-CM | POA: Diagnosis not present

## 2023-07-26 DIAGNOSIS — E669 Obesity, unspecified: Secondary | ICD-10-CM

## 2023-07-26 NOTE — Progress Notes (Signed)
   SUBJECTIVE:  Chief Complaint: Obesity Interim History: Patient feeling more burned out on meal planning.  She is worried about whether or not her husband will continue here at the clinic if she takes a break from this clinic and goes 100% of the time to the dietician in [place].   [name] is here to discuss her progress with her obesity treatment plan. She is on the keeping a food journal and adhering to recommended goals of 1200 calories and 80+ grams of protein and states she is not following her eating plan approximately 50 % of the time. She states she is not exercising.   OBJECTIVE: Visit Diagnoses: Problem List Items Addressed This Visit       Other   Elevated BP without diagnosis of hypertension - Primary   Blood pressure elevated today on both readings.  She mentions that she has been very stressed with recent current national events.  She is also feeling she needs to take time away from this clinic.  She is possibly open to following up within the 6 month period to stay a current patient but is wanting to explore going to dietician in [place] who can lead her to work in the food types she is more likely to eat.  She was encouraged to follow up with PCP for more guidance and monitoring about her BP in the time away from our clinic.      Other Visit Diagnoses       Obesity with starting BMI of 47.3         BMI 40.0-44.9, adult (HCC)           No data recorded No data recorded No data recorded No data recorded   ASSESSMENT AND PLAN:  Diet: Renatha is currently in the action stage of change. As such, her goal is to get back to weight loss efforts. She has agreed to  full plate method .  Exercise: Waylon has been instructed to try a geriatric exercise plan for weight loss and overall health benefits.   Behavior Modification:  We discussed the following Behavioral Modification Strategies today: increasing lean protein intake, increasing vegetables, meal  planning and cooking strategies, better snacking choices, and planning for success.   No follow-ups on file.[name] She was informed of the importance of frequent follow up visits to maximize her success with intensive lifestyle modifications for her multiple health conditions.  Attestation Statements:   Reviewed by clinician on day of visit: allergies, medications, problem list, medical history, surgical history, family history, social history, and previous encounter notes.      Catherine Likes, [name]

## 2023-07-30 ENCOUNTER — Other Ambulatory Visit: Payer: Self-pay | Admitting: Family Medicine

## 2023-07-30 DIAGNOSIS — I350 Nonrheumatic aortic (valve) stenosis: Secondary | ICD-10-CM

## 2023-08-02 NOTE — Progress Notes (Unsigned)
Office Visit Note  Patient: [name]             Date of Birth: March 17, 1951           MRN: [id number]             PCP: Tommie Sams, DO Referring: Tommie Sams, DO Visit Date: 08/16/2023 Occupation: @GUAROCC @  Subjective:  Discuss medication options   History of Present Illness: [name] is a 72 y.o. female with history of seropositive rheumatoid arthritis.  Patient is not currently taking immunosuppressive agents.  Patient states that she discontinued Enbrel in August 2024 due to issues receiving the prescription from [employer].  Patient had been on Enbrel 50 mg subcu days injections every 10 days from July 2021 through August 2024.  Patient states that she had had intermittent rashes while on Enbrel as well as recurrent infections for the past year.  She is apprehensive to resume Enbrel at this time.  Patient states that since discontinuing Enbrel she is actually feeling better.  She has had less morning stiffness.  She denies any joint swelling at this time.  Patient continues to have ongoing discomfort in her hips and lower back and will be starting physical therapy.   Activities of Daily Living:  Patient reports morning stiffness for 30 minutes.   Patient Reports nocturnal pain.  Difficulty dressing/grooming: Denies Difficulty climbing stairs: Denies Difficulty getting out of chair: Denies Difficulty using hands for taps, buttons, cutlery, and/or writing: Reports  Review of Systems  Constitutional:  Negative for fatigue.  HENT:  Positive for mouth dryness. Negative for mouth sores.   Eyes:  Positive for dryness.  Respiratory:  Positive for shortness of breath.   Cardiovascular:  Negative for chest pain and palpitations.  Gastrointestinal:  Negative for blood in stool, constipation and diarrhea.  Endocrine: Negative for increased urination.  Genitourinary:  Negative for involuntary urination.  Musculoskeletal:  Positive for joint pain, gait problem,  joint pain, joint swelling, myalgias, muscle weakness, morning stiffness, muscle tenderness and myalgias.  Skin:  Positive for rash and sensitivity to sunlight. Negative for color change and hair loss.  Allergic/Immunologic: Positive for susceptible to infections.  Neurological:  Positive for dizziness and headaches.  Hematological:  Negative for swollen glands.  Psychiatric/Behavioral:  Positive for depressed mood and sleep disturbance. The patient is nervous/anxious.     PMFS History:  Patient Active Problem List   Diagnosis Date Noted   Right hip pain 07/12/2023   Elevated BP without diagnosis of hypertension 06/28/2023   Lipedema 01/19/2023   Coronary atherosclerosis 10/25/2022   Aortic atherosclerosis (HCC) 10/25/2022   Petechiae 10/25/2022   Mild aortic stenosis 06/29/2022   Osteoporosis 06/28/2022   Thigh numbness 04/14/2022   Anxiety 06/28/2021   GERD (gastroesophageal reflux disease) 06/28/2021   History of total knee replacement, bilateral 10/30/2018   Seropositive rheumatoid arthritis of multiple sites (HCC) 07/20/2014   Obesity (BMI 30-39.9) 12/19/2007   DDD (degenerative disc disease), lumbar 06/27/2007   Hyperlipidemia 02/21/2007    Past Medical History:  Diagnosis Date   Allergic rhinitis    Allergy    Seasonal. Fall   Anxiety    Arthritis    Asthma    Constipation    Depression    Essential hypertension 04/04/2007   Gallbladder problem    Hematuria 08/25/2015   Hyperlipidemia    Hypertension    Impetigo    in nose per patient, dx by derm.   Obesity  Osteoarthritis    Osteoporosis 06/28/2022   Pelvic pain in female 08/25/2015   Rheumatoid arthritis (HCC)    Swallowing difficulty    Swelling     Family History  Problem Relation Age of Onset   Leukemia Mother    Hypertension Mother    Cancer Mother    Depression Mother    Early death Mother    Miscarriages / Stillbirths Mother    Heart attack Father    Alcoholism Father    Depression  Father    Heart disease Father    Hyperlipidemia Father    Hypertension Father    Cancer Sister    Obesity Sister    Other Sister        breathing problems   Hepatitis C Sister    Arthritis Sister    Depression Sister    Colon cancer Sister    Squamous cell carcinoma Sister    Obesity Sister    Cancer Sister    Arthritis Sister    Depression Sister    Squamous cell carcinoma Sister    Emphysema Brother    Other Brother        aneursym   Vision loss Maternal Aunt    Cancer Maternal Grandmother        breast   Obesity Maternal Grandmother    Obesity Daughter    Polycystic ovary syndrome Daughter    Depression Daughter    Obesity Son    Other Son        knee problems   Obesity Son    Asthma Son    Depression Son    Obesity Son    Depression Son    Obesity Son    Depression Son    Obesity Son    Diabetes Other        runs on dad's side of the family   Past Surgical History:  Procedure Laterality Date   BACK SURGERY     CHOLECYSTECTOMY     COLONOSCOPY  2007   sessile sigmoid polyp x 1; path: serrated adenoma   COLONOSCOPY N/A 10/12/2014   Procedure: COLONOSCOPY;  Surgeon: West Bali, MD;  Location: [facility];  Service: Endoscopy;  Laterality: N/A;   COLONOSCOPY N/A 03/05/2020   Procedure: COLONOSCOPY;  Surgeon: Corbin Ade, MD;  Location: [facility];  Service: Endoscopy;  Laterality: N/A;  9:30   EYE SURGERY  2019   Cataract   JOINT REPLACEMENT     BIL knee    KNEE ARTHROPLASTY     KNEE SURGERY     bilateral knee replacement   LIPOMA EXCISION     x 2   SPINE SURGERY  2008   Rod L4-L5   TONSILLECTOMY AND ADENOIDECTOMY     Social History   Social History Narrative   Not on file   Immunization History  Administered Date(s) Administered   Fluad Quad(high Dose 65+) 05/27/2020, 05/12/2022   Influenza Split 06/17/2013   Influenza Whole 06/27/2007, 05/08/2008   Influenza, High Dose Seasonal PF 06/14/2017, 06/18/2018   Influenza,inj,Quad  PF,6+ Mos 07/12/2015   Influenza-Unspecified 06/14/2017, 06/18/2018, 05/11/2019, 05/14/2021   PFIZER(Purple Top)SARS-COV-2 Vaccination 10/12/2019, 11/04/2019, 06/03/2020, 05/23/2021   PNEUMOCOCCAL CONJUGATE-20 12/26/2021, 06/05/2022   Pfizer(Comirnaty)Fall Seasonal Vaccine 12 years and older 05/12/2022   Pneumococcal Polysaccharide-23 02/18/1993, 06/27/2007   Zoster Recombinant(Shingrix) 08/19/2018, 06/05/2022     Objective: Vital Signs: BP (!) 187/83 (BP Location: Right Arm, Patient Position: Sitting, Cuff Size: Normal)   Pulse 79  Resp 16   Ht 5\' 4"  (1.626 m)   Wt 247 lb (112 kg)   BMI 42.40 kg/m    Physical Exam Vitals and nursing note reviewed.  Constitutional:      Appearance: She is well-developed.  HENT:     Head: Normocephalic and atraumatic.  Eyes:     Conjunctiva/sclera: Conjunctivae normal.  Cardiovascular:     Rate and Rhythm: Normal rate and regular rhythm.     Heart sounds: Normal heart sounds.  Pulmonary:     Effort: Pulmonary effort is normal.     Breath sounds: Normal breath sounds.  Abdominal:     General: Bowel sounds are normal.     Palpations: Abdomen is soft.  Musculoskeletal:     Cervical back: Normal range of motion.  Lymphadenopathy:     Cervical: No cervical adenopathy.  Skin:    General: Skin is warm and dry.     Capillary Refill: Capillary refill takes less than 2 seconds.  Neurological:     Mental Status: She is alert and oriented to person, place, and time.  Psychiatric:        Behavior: Behavior normal.      Musculoskeletal Exam: C-spine has slightly limited ROM with lateral rotation to the left.  Good flexion and extension of the C-spine. Thoracic kyphosis noted. Limited mobility of lumbar spine.  Shoulder joints have good ROM. Elbow joints have good ROM with no tenderness or joint swelling. Ulnar deviation and thickening of MCP joints.  PIP and DIP thickening consistent with osteoarthritis of both hands.  Hip joints have good ROM with  no groin pain. Bilateral knee replacements have good ROM with no warmth or effusion. Ankle joints have good ROM with no tenderness or joint swelling.    CDAI Exam: CDAI Score: -- Patient Global: 50 / 100; Provider Global: 10 / 100 Swollen: --; Tender: -- Joint Exam 08/16/2023   No joint exam has been documented for this visit   There is currently no information documented on the homunculus. Go to the Rheumatology activity and complete the homunculus joint exam.  Investigation: No additional findings.  Imaging: ECHOCARDIOGRAM COMPLETE Result Date: 08/13/2023    ECHOCARDIOGRAM REPORT   Patient Name:   GILLERMINA MAIERS Janczak Date of Exam: 08/13/2023 Medical Rec #:  [id number]       Height:       63.0 in Accession #:    [id number]      Weight:       238.0 lb Date of Birth:  1951-07-17       BSA:          2.082 m Patient Age:    72 years        BP:           159/85 mmHg Patient Gender: F               HR:           76 bpm. [facility] Procedure: 2D Echo, Cardiac Doppler and Color Doppler Indications:    Aortic Stenosis l35.0  History:        Patient has prior history of Echocardiogram examinations, most                 recent 11/04/2018. Risk Factors:Hypertension and Dyslipidemia.  Sonographer:    Celesta Gentile RCS Referring Phys: [phone] Verdis Frederickson COOK IMPRESSIONS  1. Left ventricular ejection fraction, by estimation, is 65 to 70%. The left ventricle has normal  function. The left ventricle has no regional wall motion abnormalities. Left ventricular diastolic parameters are consistent with Grade I diastolic dysfunction (impaired relaxation).  2. Right ventricular systolic function is normal. The right ventricular size is normal. Tricuspid regurgitation signal is inadequate for assessing PA pressure.  3. The mitral valve is grossly normal. Trivial mitral valve regurgitation.  4. The aortic valve is tricuspid. There is moderate calcification of the aortic valve. Aortic valve regurgitation is trivial.  Moderate aortic valve stenosis. Aortic valve mean gradient measures 23.3 mmHg. Dimentionless index 0.48.  5. The inferior vena cava is dilated in size with >50% respiratory variability, suggesting right atrial pressure of 8 mmHg. Comparison(s): Prior images reviewed side by side. LVEF vigorous at 65-70%. Aortic stenosis moderate range with mean AV gradient up to 23 mmHg. FINDINGS  Left Ventricle: Left ventricular ejection fraction, by estimation, is 65 to 70%. The left ventricle has normal function. The left ventricle has no regional wall motion abnormalities. The left ventricular internal cavity size was normal in size. There is  borderline left ventricular hypertrophy. Left ventricular diastolic parameters are consistent with Grade I diastolic dysfunction (impaired relaxation). Right Ventricle: The right ventricular size is normal. No increase in right ventricular wall thickness. Right ventricular systolic function is normal. Tricuspid regurgitation signal is inadequate for assessing PA pressure. Left Atrium: Left atrial size was normal in size. Right Atrium: Right atrial size was normal in size. Pericardium: Trivial pericardial effusion is present. The pericardial effusion is circumferential. Mitral Valve: The mitral valve is grossly normal. Trivial mitral valve regurgitation. Tricuspid Valve: The tricuspid valve is grossly normal. Tricuspid valve regurgitation is trivial. Aortic Valve: The aortic valve is tricuspid. There is moderate calcification of the aortic valve. There is mild aortic valve annular calcification. Aortic valve regurgitation is trivial. Moderate aortic stenosis is present. Aortic valve mean gradient measures 23.3 mmHg. Aortic valve peak gradient measures 39.4 mmHg. Aortic valve area, by VTI measures 1.23 cm. Pulmonic Valve: The pulmonic valve was grossly normal. Pulmonic valve regurgitation is trivial. Aorta: The aortic root is normal in size and structure. Venous: The inferior vena cava is  dilated in size with greater than 50% respiratory variability, suggesting right atrial pressure of 8 mmHg. IAS/Shunts: No atrial level shunt detected by color flow Doppler.  LEFT VENTRICLE PLAX 2D LVIDd:         4.30 cm   Diastology LVIDs:         3.00 cm   LV e' medial:    4.03 cm/s LV PW:         1.00 cm   LV E/e' medial:  25.6 LV IVS:        1.00 cm   LV e' lateral:   5.11 cm/s LVOT diam:     1.80 cm   LV E/e' lateral: 20.2 LV SV:         96 LV SV Index:   46 LVOT Area:     2.54 cm  RIGHT VENTRICLE RV S prime:     19.00 cm/s TAPSE (M-mode): 2.6 cm LEFT ATRIUM             Index        RIGHT ATRIUM           Index LA diam:        3.70 cm 1.78 cm/m   RA Area:     15.60 cm LA Vol (A2C):   43.9 ml 21.09 ml/m  RA Volume:   41.50 ml  19.93  ml/m LA Vol (A4C):   62.6 ml 30.07 ml/m LA Biplane Vol: 52.4 ml 25.17 ml/m  AORTIC VALVE AV Area (Vmax):    1.18 cm AV Area (Vmean):   1.23 cm AV Area (VTI):     1.23 cm AV Vmax:           314.00 cm/s AV Vmean:          225.000 cm/s AV VTI:            0.783 m AV Peak Grad:      39.4 mmHg AV Mean Grad:      23.3 mmHg LVOT Vmax:         145.00 cm/s LVOT Vmean:        109.000 cm/s LVOT VTI:          0.379 m LVOT/AV VTI ratio: 0.48  AORTA Ao Root diam: 3.20 cm MITRAL VALVE MV Area (PHT): 2.22 cm     SHUNTS MV Decel Time: 342 msec     Systemic VTI:  0.38 m MV E velocity: 103.00 cm/s  Systemic Diam: 1.80 cm MV A velocity: 141.00 cm/s MV E/A ratio:  0.73 Nona Dell MD Electronically signed by Nona Dell MD Signature Date/Time: 08/13/2023/2:58:07 PM    Final     Recent Labs: Lab Results  Component Value Date   WBC 5.0 12/26/2022   HGB 13.0 12/26/2022   PLT 208 12/26/2022   NA 141 12/26/2022   K 4.9 12/26/2022   CL 103 12/26/2022   CO2 22 12/26/2022   GLUCOSE 97 12/26/2022   BUN 12 12/26/2022   CREATININE 0.76 12/26/2022   BILITOT 0.8 12/26/2022   ALKPHOS 57 12/26/2022   AST 21 12/26/2022   ALT 16 12/26/2022   PROT 6.7 12/26/2022   ALBUMIN 4.4  12/26/2022   CALCIUM 10.3 12/26/2022   GFRAA 73 07/23/2020   QFTBGOLDPLUS Negative 03/09/2022    Speciality Comments: PLQ Eye Exam: 12/03/2019 WNL @ [facility] Follow up in 1 year MTX -dcd 09/19 Enbrel start date 03/10/20  Procedures:  No procedures performed Allergies: Codeine, Cyclosporine, and Propylene glycol   Assessment / Plan:     Visit Diagnoses: Seropositive rheumatoid arthritis of multiple sites (HCC) - positive RF, positive anti-CCP. Previous patient of Dr. Kellie Simmering with multiple contractures, ultrasound of both hands on 10/30/18 revealed synovitis: She has no synovitis on examination today.  She has been experiencing some aching in both wrists and bilateral TMJs but has not had any joint inflammation.  She attributes most of her discomfort to underlying osteoarthritis.  Patient discontinued Enbrel in August 2024 due to several concerns.  She was initially started on Enbrel in July 2021 but for the past 1 year had been experiencing recurrent infections as well as intermittent rashes which she attributed to the use of Enbrel.  She was also having issues with the  [employer] patient assistance program/speciality pharmacy in regards to receiving her Enbrel prescriptions on time.  Since discontinuing Enbrel she has felt much better and has had less morning stiffness.  She does not want to resume Enbrel at this time.  Discussed the concerns for untreated/uncontrolled rheumatoid arthritis.  Plan to check sed rate and CRP today.  Patient was advised to monitor her symptoms closely and will notify us if she develops increased morning stiffness, nocturnal pain, or joint inflammation.  Patient would like to opt for observation at this time rather than restarting an immunosuppressive agent.  She will notify us if she develops any new  or worsening symptoms. - Plan: Sedimentation rate, C-reactive protein  High risk medication use -Not currently taking any immunosuppresivent agents.  Previous therapy:  Enbrel 50 mg sq injections once every 10 days-started July 2021-discontinued in August 2024.   D/c MTX and Plaquenil.  CBC and CMP updated on 12/26/22.  Orders for CBC and CMP released today.   TB gold negative on 03/09/22. Order for TB gold released today.  No recent infections.   - Plan: CBC with Differential/Platelet, COMPLETE METABOLIC PANEL WITH GFR, QuantiFERON-TB Gold Plus  Screening for tuberculosis - TB gold updated today.  Plan: QuantiFERON-TB Gold Plus  Chronic left shoulder pain: Not currently symptomatic.   Chronic right shoulder pain:Not currently symptomatic.   History of total knee replacement, bilateral: Doing well. Good ROM with no discomfort.  No warmth or effusion noted.    Trochanteric bursitis of right hip: Intermittent discomfort.  She will be starting PT.   Degeneration of intervertebral disc of lumbar region without discogenic back pain or lower extremity pain - S/p fusion: Chronic pain.  She will be starting PT ordered by orthopedics.   Osteopenia of multiple sites - DEXA 01/12/20: The BMD measured at Forearm Radius 33% is 0.555 g/cm2 with a T-score of -2.2.   May 23, 2022 DEXA scan T-score -2.5, BMD 0.534-3.8%. She is taking fosamax 70 mg 1 tablet by mouth once weekly.   Other medical conditions are listed as follows:   Essential hypertension: BP was elevated today in the office--patient was advised to monitor blood pressure closely.  She is under the care of cardiology and had an echocardiogram on 08/13/23-results have not been completed yet.   History of MRSA infection  History of hyperlipidemia  History of adenomatous polyp of colon  History of asthma  History of depression  History of anxiety  Pedal edema   Orders: Orders Placed This Encounter  Procedures   CBC with Differential/Platelet   COMPLETE METABOLIC PANEL WITH GFR   QuantiFERON-TB Gold Plus   Sedimentation rate   C-reactive protein   No orders of the defined types were placed  in this encounter.    Follow-Up Instructions: Return in about 5 months (around 01/14/2024) for Rheumatoid arthritis.   Gearldine Bienenstock, PA-C  Note - This record has been created using Dragon software.  Chart creation errors have been sought, but may not always  have been located. Such creation errors do not reflect on  the standard of medical care.

## 2023-08-05 NOTE — Assessment & Plan Note (Signed)
Blood pressure elevated today on both readings.  She mentions that she has been very stressed with recent current national events.  She is also feeling she needs to take time away from this clinic.  She is possibly open to following up within the 6 month period to stay a current patient but is wanting to explore going to dietician in [place] who can lead her to work in the food types she is more likely to eat.  She was encouraged to follow up with PCP for more guidance and monitoring about her BP in the time away from our clinic.

## 2023-08-13 ENCOUNTER — Ambulatory Visit (HOSPITAL_COMMUNITY)
Admission: RE | Admit: 2023-08-13 | Discharge: 2023-08-13 | Disposition: A | Discharge status: Home / Self Care | Payer: PPO | Source: Ambulatory Visit | Attending: Family Medicine | Admitting: Family Medicine

## 2023-08-13 DIAGNOSIS — I350 Nonrheumatic aortic (valve) stenosis: Secondary | ICD-10-CM | POA: Diagnosis not present

## 2023-08-13 LAB — ECHOCARDIOGRAM COMPLETE
AR max vel: 1.18 cm2
AV Area VTI: 1.23 cm2
AV Area mean vel: 1.23 cm2
AV Mean grad: 23.3 mm[Hg]
AV Peak grad: 39.4 mm[Hg]
Ao pk vel: 3.14 m/s
Area-P 1/2: 2.22 cm2
S' Lateral: 3 cm

## 2023-08-13 NOTE — Progress Notes (Signed)
*  PRELIMINARY RESULTS* Echocardiogram 2D Echocardiogram has been performed.  Stacey Drain 08/13/2023, 10:27 AM

## 2023-08-16 ENCOUNTER — Ambulatory Visit: Payer: PPO | Attending: Physician Assistant | Admitting: Physician Assistant

## 2023-08-16 ENCOUNTER — Encounter: Payer: Self-pay | Admitting: Physician Assistant

## 2023-08-16 VITALS — BP 187/83 | HR 79 | Resp 16 | Ht 64.0 in | Wt 247.0 lb

## 2023-08-16 DIAGNOSIS — M51369 Other intervertebral disc degeneration, lumbar region without mention of lumbar back pain or lower extremity pain: Secondary | ICD-10-CM

## 2023-08-16 DIAGNOSIS — Z8659 Personal history of other mental and behavioral disorders: Secondary | ICD-10-CM

## 2023-08-16 DIAGNOSIS — M25511 Pain in right shoulder: Secondary | ICD-10-CM | POA: Diagnosis not present

## 2023-08-16 DIAGNOSIS — Z96653 Presence of artificial knee joint, bilateral: Secondary | ICD-10-CM

## 2023-08-16 DIAGNOSIS — Z79899 Other long term (current) drug therapy: Secondary | ICD-10-CM | POA: Diagnosis not present

## 2023-08-16 DIAGNOSIS — M8589 Other specified disorders of bone density and structure, multiple sites: Secondary | ICD-10-CM

## 2023-08-16 DIAGNOSIS — I10 Essential (primary) hypertension: Secondary | ICD-10-CM | POA: Diagnosis not present

## 2023-08-16 DIAGNOSIS — Z111 Encounter for screening for respiratory tuberculosis: Secondary | ICD-10-CM

## 2023-08-16 DIAGNOSIS — M0579 Rheumatoid arthritis with rheumatoid factor of multiple sites without organ or systems involvement: Secondary | ICD-10-CM | POA: Diagnosis not present

## 2023-08-16 DIAGNOSIS — Z8639 Personal history of other endocrine, nutritional and metabolic disease: Secondary | ICD-10-CM | POA: Diagnosis not present

## 2023-08-16 DIAGNOSIS — R600 Localized edema: Secondary | ICD-10-CM

## 2023-08-16 DIAGNOSIS — Z860101 Personal history of adenomatous and serrated colon polyps: Secondary | ICD-10-CM | POA: Diagnosis not present

## 2023-08-16 DIAGNOSIS — Z8614 Personal history of Methicillin resistant Staphylococcus aureus infection: Secondary | ICD-10-CM | POA: Diagnosis not present

## 2023-08-16 DIAGNOSIS — Z8709 Personal history of other diseases of the respiratory system: Secondary | ICD-10-CM

## 2023-08-16 DIAGNOSIS — M7061 Trochanteric bursitis, right hip: Secondary | ICD-10-CM

## 2023-08-16 DIAGNOSIS — M25512 Pain in left shoulder: Secondary | ICD-10-CM | POA: Diagnosis not present

## 2023-08-16 DIAGNOSIS — Z9225 Personal history of immunosupression therapy: Secondary | ICD-10-CM

## 2023-08-16 DIAGNOSIS — G8929 Other chronic pain: Secondary | ICD-10-CM

## 2023-08-17 ENCOUNTER — Other Ambulatory Visit: Payer: Self-pay | Admitting: Family Medicine

## 2023-08-17 ENCOUNTER — Encounter: Payer: Self-pay | Admitting: *Deleted

## 2023-08-17 NOTE — Addendum Note (Signed)
Addended by: Margaretha Sheffield on: 08/17/2023 02:47 PM   Modules accepted: Orders

## 2023-08-20 DIAGNOSIS — R233 Spontaneous ecchymoses: Secondary | ICD-10-CM | POA: Diagnosis not present

## 2023-08-20 DIAGNOSIS — E7849 Other hyperlipidemia: Secondary | ICD-10-CM | POA: Diagnosis not present

## 2023-08-20 DIAGNOSIS — M0579 Rheumatoid arthritis with rheumatoid factor of multiple sites without organ or systems involvement: Secondary | ICD-10-CM | POA: Diagnosis not present

## 2023-08-20 DIAGNOSIS — R030 Elevated blood-pressure reading, without diagnosis of hypertension: Secondary | ICD-10-CM | POA: Diagnosis not present

## 2023-08-20 DIAGNOSIS — Z111 Encounter for screening for respiratory tuberculosis: Secondary | ICD-10-CM | POA: Diagnosis not present

## 2023-08-20 DIAGNOSIS — Z79899 Other long term (current) drug therapy: Secondary | ICD-10-CM | POA: Diagnosis not present

## 2023-08-20 DIAGNOSIS — Z9225 Personal history of immunosupression therapy: Secondary | ICD-10-CM | POA: Diagnosis not present

## 2023-08-21 ENCOUNTER — Encounter: Payer: Self-pay | Admitting: Family Medicine

## 2023-08-21 DIAGNOSIS — M25551 Pain in right hip: Secondary | ICD-10-CM | POA: Diagnosis not present

## 2023-08-21 LAB — LIPID PANEL
Chol/HDL Ratio: 2.8 {ratio} (ref 0.0–4.4)
Cholesterol, Total: 175 mg/dL (ref 100–199)
HDL: 63 mg/dL (ref >39)
LDL Chol Calc (NIH): 102 mg/dL — ABNORMAL HIGH (ref 0–99)
Triglycerides: 49 mg/dL (ref 0–149)
VLDL Cholesterol Cal: 10 mg/dL (ref 5–40)

## 2023-08-21 NOTE — Progress Notes (Signed)
CRP WNL  ESR WNL  TB gold pending  CBC and CMP WNL

## 2023-08-22 ENCOUNTER — Other Ambulatory Visit: Payer: Self-pay | Admitting: Family Medicine

## 2023-08-22 DIAGNOSIS — E785 Hyperlipidemia, unspecified: Secondary | ICD-10-CM

## 2023-08-23 LAB — CMP14+EGFR
ALT: 21 [IU]/L (ref 0–32)
AST: 24 [IU]/L (ref 0–40)
Albumin: 4.3 g/dL (ref 3.8–4.8)
Alkaline Phosphatase: 60 [IU]/L (ref 44–121)
BUN/Creatinine Ratio: 13 (ref 12–28)
BUN: 10 mg/dL (ref 8–27)
Bilirubin Total: 0.5 mg/dL (ref 0.0–1.2)
CO2: 23 mmol/L (ref 20–29)
Calcium: 9.8 mg/dL (ref 8.7–10.3)
Chloride: 104 mmol/L (ref 96–106)
Creatinine, Ser: 0.8 mg/dL (ref 0.57–1.00)
Globulin, Total: 2.3 g/dL (ref 1.5–4.5)
Glucose: 93 mg/dL (ref 70–99)
Potassium: 4.1 mmol/L (ref 3.5–5.2)
Sodium: 142 mmol/L (ref 134–144)
Total Protein: 6.6 g/dL (ref 6.0–8.5)
eGFR: 78 mL/min/{1.73_m2} (ref >59)

## 2023-08-23 LAB — QUANTIFERON-TB GOLD PLUS
QuantiFERON Nil Value: 0.06 [IU]/mL
QuantiFERON TB1 Ag Value: 0.08 [IU]/mL
QuantiFERON TB2 Ag Value: 0.05 [IU]/mL

## 2023-08-23 LAB — CBC WITH DIFFERENTIAL/PLATELET
Basophils Absolute: 0 10*3/uL (ref 0.0–0.2)
Basos: 1 %
EOS (ABSOLUTE): 0.1 10*3/uL (ref 0.0–0.4)
Eos: 2 %
Hematocrit: 41.5 % (ref 34.0–46.6)
Hemoglobin: 13.5 g/dL (ref 11.1–15.9)
Immature Grans (Abs): 0 10*3/uL (ref 0.0–0.1)
Immature Granulocytes: 0 %
Lymphocytes Absolute: 1.5 10*3/uL (ref 0.7–3.1)
Lymphs: 35 %
MCH: 29.9 pg (ref 26.6–33.0)
MCHC: 32.5 g/dL (ref 31.5–35.7)
MCV: 92 fL (ref 79–97)
Monocytes Absolute: 0.4 10*3/uL (ref 0.1–0.9)
Monocytes: 9 %
Neutrophils Absolute: 2.4 10*3/uL (ref 1.4–7.0)
Neutrophils: 53 %
Platelets: 211 10*3/uL (ref 150–450)
RBC: 4.52 x10E6/uL (ref 3.77–5.28)
RDW: 14 % (ref 11.7–15.4)
WBC: 4.4 10*3/uL (ref 3.4–10.8)

## 2023-08-23 LAB — C-REACTIVE PROTEIN: CRP: <1 mg/L (ref 0–10)

## 2023-08-23 LAB — SEDIMENTATION RATE: Sed Rate: 7 mm/h (ref 0–40)

## 2023-08-28 ENCOUNTER — Telehealth: Payer: Self-pay

## 2023-08-28 DIAGNOSIS — M25551 Pain in right hip: Secondary | ICD-10-CM | POA: Diagnosis not present

## 2023-08-28 NOTE — Telephone Encounter (Signed)
 Left message for patient to return the call for additional details and recommendations regarding her lab results.

## 2023-08-30 DIAGNOSIS — M25551 Pain in right hip: Secondary | ICD-10-CM | POA: Diagnosis not present

## 2023-09-04 DIAGNOSIS — M25551 Pain in right hip: Secondary | ICD-10-CM | POA: Diagnosis not present

## 2023-09-06 DIAGNOSIS — M25551 Pain in right hip: Secondary | ICD-10-CM | POA: Diagnosis not present

## 2023-09-11 DIAGNOSIS — M25551 Pain in right hip: Secondary | ICD-10-CM | POA: Diagnosis not present

## 2023-09-13 DIAGNOSIS — M25551 Pain in right hip: Secondary | ICD-10-CM | POA: Diagnosis not present

## 2023-09-18 DIAGNOSIS — M25551 Pain in right hip: Secondary | ICD-10-CM | POA: Diagnosis not present

## 2023-09-20 ENCOUNTER — Telehealth: Payer: Self-pay | Admitting: Nutrition

## 2023-09-20 DIAGNOSIS — M25551 Pain in right hip: Secondary | ICD-10-CM | POA: Diagnosis not present

## 2023-09-20 NOTE — Telephone Encounter (Signed)
Vm left to check insurance coverage

## 2023-09-26 ENCOUNTER — Ambulatory Visit: Payer: HMO | Attending: Internal Medicine | Admitting: Internal Medicine

## 2023-09-26 ENCOUNTER — Encounter: Payer: Self-pay | Admitting: Internal Medicine

## 2023-09-26 VITALS — BP 150/86 | HR 66 | Ht 64.0 in | Wt 246.0 lb

## 2023-09-26 DIAGNOSIS — I10 Essential (primary) hypertension: Secondary | ICD-10-CM | POA: Diagnosis not present

## 2023-09-26 DIAGNOSIS — I2510 Atherosclerotic heart disease of native coronary artery without angina pectoris: Secondary | ICD-10-CM

## 2023-09-26 DIAGNOSIS — I350 Nonrheumatic aortic (valve) stenosis: Secondary | ICD-10-CM | POA: Diagnosis not present

## 2023-09-26 MED ORDER — LISINOPRIL 2.5 MG PO TABS: 2.5000 mg | ORAL_TABLET | Freq: Every day | ORAL | 3 refills | Status: AC

## 2023-09-26 NOTE — Patient Instructions (Addendum)
 Medication Instructions:  Your physician has recommended you make the following change in your medication:  Start lisinopril  2.5 mg once daily  Continue taking all medications as prescribed   Labwork: None  Testing/Procedures: Your physician has requested that you have an echocardiogram in a year. Echocardiography is a painless test that uses sound waves to create images of your heart. It provides your doctor with information about the size and shape of your heart and how well your heart's chambers and valves are working. This procedure takes approximately one hour. There are no restrictions for this procedure. Please do NOT wear cologne, perfume, aftershave, or lotions (deodorant is allowed). Please arrive 15 minutes prior to your appointment time.  Please note: We ask at that you not bring children with you during ultrasound (echo/ vascular) testing. Due to room size and safety concerns, children are not allowed in the ultrasound rooms during exams. Our front office staff cannot provide observation of children in our lobby area while testing is being conducted. An adult accompanying a patient to their appointment will only be allowed in the ultrasound room at the discretion of the ultrasound technician under special circumstances. We apologize for any inconvenience.   Follow-Up: Your physician recommends that you schedule a follow-up appointment in: 1 year. You will receive a reminder call in about months reminding you to schedule your appointment. If you don't receive this call, please contact our office.   Any Other Special Instructions Will Be Listed Below (If Applicable). Thank you for choosing [facility]!      If you need a refill on your cardiac medications before your next appointment, please call your pharmacy.

## 2023-09-26 NOTE — Progress Notes (Signed)
 Cardiology Office Note  Date: 09/26/2023   ID: Camron, Essman 10-21-1950, MRN [id number]  PCP:  Bluford Jacqulyn MATSU, DO  Cardiologist:  Diannah SHAUNNA Maywood, MD Electrophysiologist:  None   History of Present Illness: [name] is a 73 y.o. female  Echo showed moderate AS and trivial AI.  Referred to cardiology clinic for the same.  No symptoms, no chest pain, DOE, dizziness, syncope, presyncope, palpitations or leg swelling.  Home the pressures range between 140 and 155 mmHg SBP.  Past Medical History:  Diagnosis Date   Allergic rhinitis    Allergy    Seasonal. Fall   Anxiety    Arthritis    Asthma    Constipation    Depression    Essential hypertension 04/04/2007   Gallbladder problem    Hematuria 08/25/2015   Hyperlipidemia    Hypertension    Impetigo    in nose per patient, dx by derm.   Obesity    Osteoarthritis    Osteoporosis 06/28/2022   Pelvic pain in female 08/25/2015   Rheumatoid arthritis (HCC)    Swallowing difficulty    Swelling     Past Surgical History:  Procedure Laterality Date   BACK SURGERY     CHOLECYSTECTOMY     COLONOSCOPY  2007   sessile sigmoid polyp x 1; path: serrated adenoma   COLONOSCOPY N/A 10/12/2014   Procedure: COLONOSCOPY;  Surgeon: Margo LITTIE Haddock, MD;  [facility];  Service: Endoscopy;  Laterality: N/A;   COLONOSCOPY N/A 03/05/2020   Procedure: COLONOSCOPY;  Surgeon: Shaaron Lamar HERO, MD;  [facility];  Service: Endoscopy;  Laterality: N/A;  9:30   EYE SURGERY  2019   Cataract   JOINT REPLACEMENT     BIL knee    KNEE ARTHROPLASTY     KNEE SURGERY     bilateral knee replacement   LIPOMA EXCISION     x 2   SPINE SURGERY  2008   Rod L4-L5   TONSILLECTOMY AND ADENOIDECTOMY      Current Outpatient Medications  Medication Sig Dispense Refill   acetaminophen  (TYLENOL ) 500 MG tablet Take 500-1,000 mg by mouth every 6 (six) hours as needed (pain).      alendronate  (FOSAMAX ) 70 MG tablet Take 1  tablet (70 mg total) by mouth every 7 (seven) days. Take with a full glass of water  on an empty stomach. 12 tablet 3   atorvastatin  (LIPITOR) 80 MG tablet Take 1 tablet (80 mg total) by mouth daily. 90 tablet 3   B Complex Vitamins (VITAMIN B COMPLEX) TABS Take 1 tablet by mouth at bedtime.      busPIRone  (BUSPAR ) 7.5 MG tablet Take 1 tablet (7.5 mg total) by mouth 2 (two) times daily. 180 tablet 3   Cholecalciferol (VITAMIN D ) 125 MCG (5000 UT) CAPS Take 5,000 Units by mouth daily after breakfast.     ezetimibe  (ZETIA ) 10 MG tablet TAKE 1 TABLET BY MOUTH EVERY DAY 90 tablet 3   famotidine  (PEPCID ) 40 MG tablet TAKE 1 TABLET BY MOUTH DAILY AS NEEDED 90 tablet 3   lisinopril  (ZESTRIL ) 2.5 MG tablet Take 1 tablet (2.5 mg total) by mouth daily. 90 tablet 3   Magnesium 500 MG CAPS Take 500 mg by mouth.     Probiotic Product (ALIGN PO) Take 1 capsule by mouth daily.      Propylene Glycol (SYSTANE BALANCE OP) Place 1 drop into both eyes 4 (four) times daily as  needed (dry/irritated eyes.).      traMADol  (ULTRAM ) 50 MG tablet TAKE 1-2 TABLETS EVERY 8 (EIGHT) HOURS AS NEEDED FOR MODERATE PAIN OR SEVERE PAIN. 30 tablet 0   Etanercept  (ENBREL  MINI) 50 MG/ML SOCT Inject 50 mg into the skin once a week. (Patient not taking: Reported on 09/26/2023) 12 mL 0   RESTASIS 0.05 % ophthalmic emulsion Place 1 drop into both eyes 2 (two) times daily.     No current facility-administered medications for this visit.   Allergies:  Codeine, Cyclosporine, and Propylene glycol   Social History: The patient  reports that she has never smoked. She has never been exposed to tobacco smoke. She has never used smokeless tobacco. She reports that she does not currently use alcohol. She reports that she does not use drugs.   Family History: The patient's family history includes Alcoholism in her father; Arthritis in her sister and sister; Asthma in her son; Cancer in her maternal grandmother, mother, sister, and sister; Colon cancer  in her sister; Depression in her daughter, father, mother, sister, sister, son, son, and son; Diabetes in an other family member; Early death in her mother; Emphysema in her brother; Heart attack in her father; Heart disease in her father; Hepatitis C in her sister; Hyperlipidemia in her father; Hypertension in her father and mother; Leukemia in her mother; Miscarriages / Stillbirths in her mother; Obesity in her daughter, maternal grandmother, sister, sister, son, son, son, son, and son; Other in her brother, sister, and son; Polycystic ovary syndrome in her daughter; Squamous cell carcinoma in her sister and sister; Vision loss in her maternal aunt.   ROS:  Please see the history of present illness. Otherwise, complete review of systems is positive for none  All other systems are reviewed and negative.   Physical Exam: VS:  BP (!) 150/86   Pulse 66   Ht 5' 4 (1.626 m)   Wt 246 lb (111.6 kg)   SpO2 96%   BMI 42.23 kg/m , BMI Body mass index is 42.23 kg/m.  Wt Readings from Last 3 Encounters:  09/26/23 246 lb (111.6 kg)  08/16/23 247 lb (112 kg)  07/26/23 238 lb (108 kg)    General: Patient appears comfortable at rest. HEENT: Conjunctiva and lids normal, oropharynx clear with moist mucosa. Neck: Supple, no elevated JVP or carotid bruits, no thyromegaly. Lungs: Clear to auscultation, nonlabored breathing at rest. Cardiac: Regular rate and rhythm, no S3 or significant systolic murmur, no pericardial rub. Abdomen: Soft, nontender, no hepatomegaly, bowel sounds present, no guarding or rebound. Extremities: No pitting edema, distal pulses 2+. Skin: Warm and dry. Musculoskeletal: No kyphosis. Neuropsychiatric: Alert and oriented x3, affect grossly appropriate.  Recent Labwork: 08/20/2023: ALT 21; AST 24; BUN 10; Creatinine, Ser 0.80; Hemoglobin 13.5; Platelets 211; Potassium 4.1; Sodium 142     Component Value Date/Time   CHOL 175 08/20/2023 0919   TRIG 49 08/20/2023 0919   HDL 63  08/20/2023 0919   CHOLHDL 2.8 08/20/2023 0919   CHOLHDL 6.6 07/09/2014 0819   VLDL 13 07/09/2014 0819   LDLCALC 102 (H) 08/20/2023 0919     Assessment and Plan:  Moderate aortic valve stenosis HTN, new diagnosis  -Echo showed moderate aortic valve stenosis and trivial aortic valve regurgitation with mean PG 23 mmHg and DVI 0.48.  Asymptomatic currently.  I reviewed the pathophysiology, evaluation and management of aortic valve stenosis.  ER precautions for severe aortic valve stenosis provided.  Will obtain echocardiogram in 1 year.  She recently noticed elevated blood pressures at home, ranging between 140 and 155 mmHg SBP.  Likely due to progression of aortic valve stenosis.  Will start lisinopril  2.5 mg once daily. -Currently on atorvastatin  80 mg nightly and Zetia  10 mg once daily for HLD, follows with PCP.     Medication Adjustments/Labs and Tests Ordered: Current medicines are reviewed at length with the patient today.  Concerns regarding medicines are outlined above.    Disposition:  Follow up  1 year  Signed Marguriete Wootan Priya Haedyn Breau, MD, 09/26/2023 2:48 PM    [facility] at [facility] [address] [place], [place], [place] [address]

## 2023-10-03 ENCOUNTER — Ambulatory Visit: Payer: PPO | Admitting: Nutrition

## 2023-10-15 DIAGNOSIS — M546 Pain in thoracic spine: Secondary | ICD-10-CM | POA: Diagnosis not present

## 2023-10-15 DIAGNOSIS — M9901 Segmental and somatic dysfunction of cervical region: Secondary | ICD-10-CM | POA: Diagnosis not present

## 2023-10-15 DIAGNOSIS — M5413 Radiculopathy, cervicothoracic region: Secondary | ICD-10-CM | POA: Diagnosis not present

## 2023-10-15 DIAGNOSIS — M9902 Segmental and somatic dysfunction of thoracic region: Secondary | ICD-10-CM | POA: Diagnosis not present

## 2023-10-26 DIAGNOSIS — M9902 Segmental and somatic dysfunction of thoracic region: Secondary | ICD-10-CM | POA: Diagnosis not present

## 2023-10-26 DIAGNOSIS — M5413 Radiculopathy, cervicothoracic region: Secondary | ICD-10-CM | POA: Diagnosis not present

## 2023-10-26 DIAGNOSIS — M546 Pain in thoracic spine: Secondary | ICD-10-CM | POA: Diagnosis not present

## 2023-10-26 DIAGNOSIS — M9901 Segmental and somatic dysfunction of cervical region: Secondary | ICD-10-CM | POA: Diagnosis not present

## 2023-12-07 ENCOUNTER — Ambulatory Visit (INDEPENDENT_AMBULATORY_CARE_PROVIDER_SITE_OTHER): Payer: PPO

## 2023-12-07 VITALS — Ht 64.0 in | Wt 246.0 lb

## 2023-12-07 DIAGNOSIS — Z0000 Encounter for general adult medical examination without abnormal findings: Secondary | ICD-10-CM | POA: Diagnosis not present

## 2023-12-07 NOTE — Patient Instructions (Signed)
 Ms. Hellmann , Thank you for taking time to come for your Medicare Wellness Visit. I appreciate your ongoing commitment to your health goals. Please review the following plan we discussed and let me know if I can assist you in the future.   Referrals/Orders/Follow-Ups/Clinician Recommendations: Aim for 30 minutes of exercise or brisk walking, 6-8 glasses of water , and 5 servings of fruits and vegetables each day.  This is a list of the screening recommended for you and due dates:  Health Maintenance  Topic Date Due   DTaP/Tdap/Td vaccine (1 - Tdap) Never done   COVID-19 Vaccine (6 - 2024-25 season) 04/22/2023   Flu Shot  03/21/2024   Mammogram  10/04/2024   Medicare Annual Wellness Visit  12/06/2024   Colon Cancer Screening  03/05/2030   Pneumonia Vaccine  Completed   DEXA scan (bone density measurement)  Completed   Hepatitis C Screening  Completed   Zoster (Shingles) Vaccine  Completed   HPV Vaccine  Aged Out   Meningitis B Vaccine  Aged Out    Advanced directives: (ACP Link)Information on Advanced Care Planning can be found at [place]  Secretary of [facility] Advance Health Care Directives Advance Health Care Directives. [email or web address]   Next Medicare Annual Wellness Visit scheduled for next year: Yes

## 2023-12-07 NOTE — Progress Notes (Signed)
 Subjective:   [name] is a 73 y.o. who presents for a Medicare Wellness preventive visit.  Visit Complete: Virtual I connected with  [name] on 12/07/23 by a audio enabled telemedicine application and verified that I am speaking with the correct person using two identifiers.  Patient Location: Home  Provider Location: Home Office  I discussed the limitations of evaluation and management by telemedicine. The patient expressed understanding and agreed to proceed.  Vital Signs: Because this visit was a virtual/telehealth visit, some criteria may be missing or patient reported. Any vitals not documented were not able to be obtained and vitals that have been documented are patient reported.  VideoDeclined- This patient declined Librarian, academic. Therefore the visit was completed with audio only.  Persons Participating in Visit: Patient.  AWV Questionnaire: Yes: Patient Medicare AWV questionnaire was completed by the patient on 11/30/23; I have confirmed that all information answered by patient is correct and no changes since this date.  Cardiac Risk Factors include: advanced age (>88men, >61 women);dyslipidemia;hypertension     Objective:    Today's Vitals   12/07/23 0912  Weight: 246 lb (111.6 kg)  Height: 5\' 4"  (1.626 m)   Body mass index is 42.23 kg/m.     12/07/2023    9:16 AM 12/01/2022    8:43 AM 11/08/2021    8:25 AM 03/07/2021    9:59 AM 03/05/2020    8:07 AM 01/03/2016    8:19 AM 10/12/2014    1:37 PM  Advanced Directives  Does Patient Have a Medical Advance Directive? No Yes Yes Yes Yes Yes Yes  Type of Furniture conservator/restorer;Living will Healthcare Power of [place];Living will Living will Living will Healthcare Power of [place];Living will Healthcare Power of [place];Living will  Does patient want to make changes to medical advance directive?  No - Patient declined       Copy of Healthcare Power of  Attorney in Chart?  No - copy requested No - copy requested   No - copy requested   Would patient like information on creating a medical advance directive? Yes (MAU/Ambulatory/Procedural Areas - Information given)          Current Medications (verified) Outpatient Encounter Medications as of 12/07/2023  Medication Sig   acetaminophen  (TYLENOL ) 500 MG tablet Take 500-1,000 mg by mouth every 6 (six) hours as needed (pain).    alendronate  (FOSAMAX ) 70 MG tablet Take 1 tablet (70 mg total) by mouth every 7 (seven) days. Take with a full glass of water  on an empty stomach.   atorvastatin  (LIPITOR) 80 MG tablet Take 1 tablet (80 mg total) by mouth daily.   B Complex Vitamins (VITAMIN B COMPLEX) TABS Take 1 tablet by mouth at bedtime.    busPIRone  (BUSPAR ) 7.5 MG tablet Take 1 tablet (7.5 mg total) by mouth 2 (two) times daily.   Cholecalciferol (VITAMIN D ) 125 MCG (5000 UT) CAPS Take 5,000 Units by mouth daily after breakfast.   ezetimibe  (ZETIA ) 10 MG tablet TAKE 1 TABLET BY MOUTH EVERY DAY   famotidine  (PEPCID ) 40 MG tablet TAKE 1 TABLET BY MOUTH DAILY AS NEEDED   lisinopril  (ZESTRIL ) 2.5 MG tablet Take 1 tablet (2.5 mg total) by mouth daily.   Magnesium 500 MG CAPS Take 500 mg by mouth.   Probiotic Product (ALIGN PO) Take 1 capsule by mouth daily.    Propylene Glycol (SYSTANE BALANCE OP) Place 1 drop into both eyes 4 (four) times  daily as needed (dry/irritated eyes.).    RESTASIS 0.05 % ophthalmic emulsion Place 1 drop into both eyes 2 (two) times daily.   traMADol  (ULTRAM ) 50 MG tablet TAKE 1-2 TABLETS EVERY 8 (EIGHT) HOURS AS NEEDED FOR MODERATE PAIN OR SEVERE PAIN.   Etanercept  (ENBREL  MINI) 50 MG/ML SOCT Inject 50 mg into the skin once a week. (Patient not taking: Reported on 12/07/2023)   No facility-administered encounter medications on file as of 12/07/2023.    Allergies (verified) Codeine, Cyclosporine, and Propylene glycol   History: Past Medical History:  Diagnosis Date   Allergic  rhinitis    Allergy    Seasonal. Fall   Anxiety    Arthritis    Asthma    Constipation    Depression    Essential hypertension 04/04/2007   Gallbladder problem    Hematuria 08/25/2015   Hyperlipidemia    Hypertension    Impetigo    in nose per patient, dx by derm.   Obesity    Osteoarthritis    Osteoporosis 06/28/2022   Pelvic pain in female 08/25/2015   Rheumatoid arthritis (HCC)    Swallowing difficulty    Swelling    Past Surgical History:  Procedure Laterality Date   BACK SURGERY     CHOLECYSTECTOMY     COLONOSCOPY  2007   sessile sigmoid polyp x 1; path: serrated adenoma   COLONOSCOPY N/A 10/12/2014   Procedure: COLONOSCOPY;  Surgeon: Alyce Jubilee, MD;  [facility];  Service: Endoscopy;  Laterality: N/A;   COLONOSCOPY N/A 03/05/2020   Procedure: COLONOSCOPY;  Surgeon: Suzette Espy, MD;  [facility];  Service: Endoscopy;  Laterality: N/A;  9:30   EYE SURGERY  2019   Cataract   JOINT REPLACEMENT     BIL knee    KNEE ARTHROPLASTY     KNEE SURGERY     bilateral knee replacement   LIPOMA EXCISION     x 2   SPINE SURGERY  2008   Rod L4-L5   TONSILLECTOMY AND ADENOIDECTOMY     Family History  Problem Relation Age of Onset   Leukemia Mother    Hypertension Mother    Cancer Mother    Depression Mother    Early death Mother    Miscarriages / Stillbirths Mother    Heart attack Father    Alcoholism Father    Depression Father    Heart disease Father    Hyperlipidemia Father    Hypertension Father    Cancer Sister    Obesity Sister    Other Sister        breathing problems   Hepatitis C Sister    Arthritis Sister    Depression Sister    Colon cancer Sister    Squamous cell carcinoma Sister    Obesity Sister    Cancer Sister    Arthritis Sister    Depression Sister    Squamous cell carcinoma Sister    Emphysema Brother    Other Brother        aneursym   Vision loss Maternal Aunt    Cancer Maternal Grandmother         breast   Obesity Maternal Grandmother    Obesity Daughter    Polycystic ovary syndrome Daughter    Depression Daughter    Obesity Son    Other Son        knee problems   Obesity Son    Asthma Son    Depression  Son    Obesity Son    Depression Son    Obesity Son    Depression Son    Obesity Son    Diabetes Other        runs on dad's side of the family   Social History   Socioeconomic History   Marital status: Married    Spouse name: [name]   Number of children: 4   Years of education: Not on file   Highest education level: Master's degree (e.g., MA, MS, MEng, MEd, MSW, MBA)  Occupational History   Occupation: Stay at home  Tobacco Use   Smoking status: Never    Passive exposure: Never   Smokeless tobacco: Never  Vaping Use   Vaping status: Never Used  Substance and Sexual Activity   Alcohol use: Yes    Comment: 1 drink occasionally   Drug use: No   Sexual activity: Yes    Birth control/protection: Post-menopausal  Other Topics Concern   Not on file  Social History Narrative   Not on file   Social Drivers of Health   Financial Resource Strain: Low Risk  (11/30/2023)   Overall Financial Resource Strain (CARDIA)    Difficulty of Paying Living Expenses: Not very hard  Food Insecurity: No Food Insecurity (11/30/2023)   Hunger Vital Sign    Worried About Running Out of Food in the Last Year: Never true    Ran Out of Food in the Last Year: Never true  Transportation Needs: No Transportation Needs (11/30/2023)   PRAPARE - Administrator, Civil Service (Medical): No    Lack of Transportation (Non-Medical): No  Physical Activity: Sufficiently Active (11/30/2023)   Exercise Vital Sign    Days of Exercise per Week: 3 days    Minutes of Exercise per Session: 50 min  Stress: Stress Concern Present (11/30/2023)   [employer] of Occupational Health - Occupational Stress Questionnaire    Feeling of Stress : Very much  Social Connections: Socially  Integrated (11/30/2023)   Social Connection and Isolation Panel [NHANES]    Frequency of Communication with Friends and Family: More than three times a week    Frequency of Social Gatherings with Friends and Family: Three times a week    Attends Religious Services: More than 4 times per year    Active Member of Clubs or Organizations: Yes    Attends Banker Meetings: More than 4 times per year    Marital Status: Married    Tobacco Counseling Counseling given: Not Answered    Clinical Intake:  Pre-visit preparation completed: Yes  Pain : No/denies pain     Diabetes: No  Lab Results  Component Value Date   HGBA1C 5.4 02/27/2019   HGBA1C 5.5 10/31/2018     How often do you need to have someone help you when you read instructions, pamphlets, or other written materials from your doctor or pharmacy?: 1 - Never  Interpreter Needed?: No  Information entered by :: Catherine Cypress LPN   Activities of Daily Living     12/07/2023    9:16 AM  In your present state of health, do you have any difficulty performing the following activities:  Hearing? 0  Vision? 0  Difficulty concentrating or making decisions? 0  Walking or climbing stairs? 0  Dressing or bathing? 0  Doing errands, shopping? 0  Preparing Food and eating ? N  Using the Toilet? N  In the past six months, have you accidently leaked  urine? N  Do you have problems with loss of bowel control? N  Managing your Medications? N  Managing your Finances? N  Housekeeping or managing your Housekeeping? N    Patient Care Team: Cook, Jayce G, DO as PCP - General (Family Medicine) Mallipeddi, Kennyth Pean, MD as PCP - Cardiology (Cardiology) Alyce Jubilee, MD (Inactive) as Consulting Physician (Gastroenterology) Lucendia Rusk, DO (Optometry) Ladd Picker, MD as Referring Physician (Internal Medicine) Nicholas Bari, MD as Consulting Physician (Rheumatology)  Indicate any recent Medical Services you may  have received from other than [facility] providers in the past year (date may be approximate).     Assessment:   This is a routine wellness examination for [name].  Hearing/Vision screen Hearing Screening - Comments:: Denies hearing difficulties   Vision Screening - Comments:: Wears rx glasses - up to date with routine eye exams with [facility]. Selene Dais    Goals Addressed             This Visit's Progress    Remain active and independent   On track      Depression Screen     12/07/2023    9:14 AM 06/28/2023   10:02 AM 01/19/2023   10:57 AM 12/27/2022    8:35 AM 12/01/2022    8:42 AM 10/25/2022    1:46 PM 09/12/2022   12:37 PM  PHQ 2/9 Scores  PHQ - 2 Score 3 3 1 1  0 1 0  PHQ- 9 Score 11 13 5 4  3      Fall Risk     12/07/2023    9:16 AM 01/19/2023   10:56 AM 12/27/2022    8:34 AM 12/01/2022    8:43 AM 11/27/2022   12:36 PM  Fall Risk   Falls in the past year? 1 1 1 1 1   Number falls in past yr: 0 0 0 0 0  Injury with Fall? 0 0 0 0 0  Risk for fall due to : History of fall(s) No Fall Risks History of fall(s) History of fall(s)   Follow up Falls prevention discussed;Education provided;Falls evaluation completed Falls evaluation completed Falls evaluation completed Falls evaluation completed;Education provided;Falls prevention discussed     MEDICARE RISK AT HOME:  Medicare Risk at Home Any stairs in or around the home?: No If so, are there any without handrails?: No Home free of loose throw rugs in walkways, pet beds, electrical cords, etc?: Yes Adequate lighting in your home to reduce risk of falls?: Yes Life alert?: No Use of a cane, walker or w/c?: No Grab bars in the bathroom?: Yes Shower chair or bench in shower?: No Elevated toilet seat or a handicapped toilet?: Yes  TIMED UP AND GO:  Was the test performed?  No  Cognitive Function: 6CIT completed        12/07/2023    9:16 AM 12/01/2022    8:43 AM 11/08/2021    8:30 AM  6CIT Screen  What Year? 0 points 0 points  0 points  What month? 0 points 0 points 0 points  What time? 0 points 0 points 0 points  Count back from 20 0 points 0 points 0 points  Months in reverse 0 points 0 points 0 points  Repeat phrase 0 points 0 points 2 points  Total Score 0 points 0 points 2 points    Immunizations Immunization History  Administered Date(s) Administered   Fluad Quad(high Dose 65+) 05/27/2020, 05/12/2022   Influenza Split 06/17/2013   Influenza Whole 06/27/2007, 05/08/2008  Influenza, High Dose Seasonal PF 06/14/2017, 06/18/2018   Influenza,inj,Quad PF,6+ Mos 07/12/2015   Influenza-Unspecified 06/14/2017, 06/18/2018, 05/11/2019, 05/14/2021   PFIZER(Purple Top)SARS-COV-2 Vaccination 10/12/2019, 11/04/2019, 06/03/2020, 05/23/2021   PNEUMOCOCCAL CONJUGATE-20 12/26/2021, 06/05/2022   Pfizer(Comirnaty)Fall Seasonal Vaccine 12 years and older 05/12/2022   Pneumococcal Polysaccharide-23 02/18/1993, 06/27/2007   Zoster Recombinant(Shingrix) 08/19/2018, 06/05/2022    Screening Tests Health Maintenance  Topic Date Due   DTaP/Tdap/Td (1 - Tdap) Never done   COVID-19 Vaccine (6 - 2024-25 season) 04/22/2023   INFLUENZA VACCINE  03/21/2024   MAMMOGRAM  10/04/2024   Medicare Annual Wellness (AWV)  12/06/2024   Colonoscopy  03/05/2030   Pneumonia Vaccine 70+ Years old  Completed   DEXA SCAN  Completed   Hepatitis C Screening  Completed   Zoster Vaccines- Shingrix  Completed   HPV VACCINES  Aged Out   Meningococcal B Vaccine  Aged Out    Health Maintenance  Health Maintenance Due  Topic Date Due   DTaP/Tdap/Td (1 - Tdap) Never done   COVID-19 Vaccine (6 - 2024-25 season) 04/22/2023    Additional Screening:  Vision Screening: Recommended annual ophthalmology exams for early detection of glaucoma and other disorders of the eye.  Dental Screening: Recommended annual dental exams for proper oral hygiene  Community Resource Referral / Chronic Care Management: CRR required this visit?  No   CCM  required this visit?  No     Plan:     I have personally reviewed and noted the following in the patient's chart:   Medical and social history Use of alcohol, tobacco or illicit drugs  Current medications and supplements including opioid prescriptions. Patient is not currently taking opioid prescriptions. Functional ability and status Nutritional status Physical activity Advanced directives List of other physicians Hospitalizations, surgeries, and ER visits in previous 12 months Vitals Screenings to include cognitive, depression, and falls Referrals and appointments  In addition, I have reviewed and discussed with patient certain preventive protocols, quality metrics, and best practice recommendations. A written personalized care plan for preventive services as well as general preventive health recommendations were provided to patient.     [name], [place]   1/61/0960   After Visit Summary: (MyChart) Due to this being a telephonic visit, the after visit summary with patients personalized plan was offered to patient via MyChart   Notes: Nothing significant to report at this time.

## 2023-12-18 ENCOUNTER — Ambulatory Visit (INDEPENDENT_AMBULATORY_CARE_PROVIDER_SITE_OTHER): Admitting: Family Medicine

## 2023-12-18 VITALS — BP 129/84 | HR 68 | Temp 97.7°F | Ht 64.0 in | Wt 242.6 lb

## 2023-12-18 DIAGNOSIS — I10 Essential (primary) hypertension: Secondary | ICD-10-CM

## 2023-12-18 DIAGNOSIS — E785 Hyperlipidemia, unspecified: Secondary | ICD-10-CM

## 2023-12-18 DIAGNOSIS — M810 Age-related osteoporosis without current pathological fracture: Secondary | ICD-10-CM | POA: Diagnosis not present

## 2023-12-18 DIAGNOSIS — Z8379 Family history of other diseases of the digestive system: Secondary | ICD-10-CM | POA: Diagnosis not present

## 2023-12-18 DIAGNOSIS — I350 Nonrheumatic aortic (valve) stenosis: Secondary | ICD-10-CM

## 2023-12-18 NOTE — Patient Instructions (Signed)
 Labs at your convenience.  Follow up in 6 months.

## 2023-12-19 MED ORDER — ALENDRONATE SODIUM 70 MG PO TABS: 70.0000 mg | ORAL_TABLET | ORAL | 3 refills | Status: AC

## 2023-12-19 NOTE — Assessment & Plan Note (Signed)
BP well-controlled.  Continue lisinopril. 

## 2023-12-19 NOTE — Assessment & Plan Note (Signed)
 Following with cardiology.  Echo in 1 year.

## 2023-12-19 NOTE — Assessment & Plan Note (Signed)
Fosamax refilled.

## 2023-12-19 NOTE — Progress Notes (Signed)
 Subjective:  Patient ID: [name], female    DOB: [birth year]  Age: 73 y.o. MRN: [id number]  CC: Follow-up   HPI:  73 year old female presents for follow-up.  Patient reports that overall she is doing well.  She states that her son was recently diagnosed with celiac disease.  She has been doing some research and feels like she has symptoms.  She would like testing.  Will discuss this today.  Patient following with cardiology.  Has moderate aortic stenosis.  Echo was recommended in 1 year.  Additionally, cardiology added lisinopril  given elevated BP.  BP fairly well-controlled here today.  Most recent LDL 102.  Patient on Zetia  and Lipitor.  Patient Active Problem List   Diagnosis Date Noted   HTN (hypertension) 09/26/2023   Lipedema 01/19/2023   Coronary atherosclerosis 10/25/2022   Aortic atherosclerosis (HCC) 10/25/2022   Moderate aortic valve stenosis 06/29/2022   Osteoporosis 06/28/2022   Anxiety 06/28/2021   GERD (gastroesophageal reflux disease) 06/28/2021   History of total knee replacement, bilateral 10/30/2018   Seropositive rheumatoid arthritis of multiple sites (HCC) 07/20/2014   Obesity (BMI 30-39.9) 12/19/2007   DDD (degenerative disc disease), lumbar 06/27/2007   Hyperlipidemia 02/21/2007    Social Hx   Social History   Socioeconomic History   Marital status: Married    Spouse name: Kinli Woltz   Number of children: 4   Years of education: Not on file   Highest education level: Master's degree (e.g., MA, MS, MEng, MEd, MSW, MBA)  Occupational History   Occupation: Stay at home  Tobacco Use   Smoking status: Never    Passive exposure: Never   Smokeless tobacco: Never  Vaping Use   Vaping status: Never Used  Substance and Sexual Activity   Alcohol use: Yes    Comment: 1 drink occasionally   Drug use: No   Sexual activity: Yes    Birth control/protection: Post-menopausal  Other Topics Concern   Not on file  Social History Narrative   Not  on file   Social Drivers of Health   Financial Resource Strain: Low Risk  (11/30/2023)   Overall Financial Resource Strain (CARDIA)    Difficulty of Paying Living Expenses: Not very hard  Food Insecurity: No Food Insecurity (11/30/2023)   Hunger Vital Sign    Worried About Running Out of Food in the Last Year: Never true    Ran Out of Food in the Last Year: Never true  Transportation Needs: No Transportation Needs (11/30/2023)   PRAPARE - Administrator, Civil Service (Medical): No    Lack of Transportation (Non-Medical): No  Physical Activity: Sufficiently Active (11/30/2023)   Exercise Vital Sign    Days of Exercise per Week: 3 days    Minutes of Exercise per Session: 50 min  Stress: Stress Concern Present (11/30/2023)   [employer] of Occupational Health - Occupational Stress Questionnaire    Feeling of Stress : Very much  Social Connections: Socially Integrated (11/30/2023)   Social Connection and Isolation Panel [NHANES]    Frequency of Communication with Friends and Family: More than three times a week    Frequency of Social Gatherings with Friends and Family: Three times a week    Attends Religious Services: More than 4 times per year    Active Member of Clubs or Organizations: Yes    Attends Banker Meetings: More than 4 times per year    Marital Status: Married    Review of Systems  Per HPI  Objective:  BP 129/84   Pulse 68   Temp 97.7 F (36.5 C)   Ht 5\' 4"  (1.626 m)   Wt 242 lb 9.6 oz (110 kg)   SpO2 99%   BMI 41.64 kg/m      12/18/2023    2:49 PM 12/07/2023    9:12 AM 09/26/2023   10:22 AM  BP/Weight  Systolic BP 129 -- 150  Diastolic BP 84 -- 86  Wt. (Lbs) 242.6 246 246  BMI 41.64 kg/m2 42.23 kg/m2 42.23 kg/m2    Physical Exam Vitals and nursing note reviewed.  Constitutional:      General: She is not in acute distress.    Appearance: Normal appearance. She is obese.  HENT:     Head: Normocephalic and atraumatic.  Eyes:      General:        Right eye: No discharge.        Left eye: No discharge.     Conjunctiva/sclera: Conjunctivae normal.  Cardiovascular:     Rate and Rhythm: Normal rate and regular rhythm.     Heart sounds: Murmur heard.  Pulmonary:     Effort: Pulmonary effort is normal.     Breath sounds: Normal breath sounds. No wheezing, rhonchi or rales.  Neurological:     Mental Status: She is alert.  Psychiatric:        Mood and Affect: Mood normal.        Behavior: Behavior normal.     Lab Results  Component Value Date   WBC 4.4 08/20/2023   HGB 13.5 08/20/2023   HCT 41.5 08/20/2023   PLT 211 08/20/2023   GLUCOSE 93 08/20/2023   CHOL 175 08/20/2023   TRIG 49 08/20/2023   HDL 63 08/20/2023   LDLCALC 102 (H) 08/20/2023   ALT 21 08/20/2023   AST 24 08/20/2023   NA 142 08/20/2023   K 4.1 08/20/2023   CL 104 08/20/2023   CREATININE 0.80 08/20/2023   BUN 10 08/20/2023   CO2 23 08/20/2023   TSH 2.53 03/17/2020   INR 1.75 (H) 03/03/2010   HGBA1C 5.4 02/27/2019     Assessment & Plan:  Primary hypertension Assessment & Plan: BP well-controlled.  Continue lisinopril .   Family history of celiac disease -     Celiac Ab tTG DGP TIgA  Hyperlipidemia, unspecified hyperlipidemia type Assessment & Plan: Last LDL was elevated.  Continue Lipitor and Zetia .  Will reassess at follow-up.  Orders: -     Lipid panel  Moderate aortic valve stenosis Assessment & Plan: Following with cardiology.  Echo in 1 year.   Age-related osteoporosis without current pathological fracture Assessment & Plan: Fosamax  refilled.  Orders: -     Alendronate  Sodium; Take 1 tablet (70 mg total) by mouth every 7 (seven) days. Take with a full glass of water  on an empty stomach.  Dispense: 12 tablet; Refill: 3    Follow-up:  6 months  Kani Chauvin Debrah Fan DO [facility]

## 2023-12-19 NOTE — Assessment & Plan Note (Signed)
 Last LDL was elevated.  Continue Lipitor and Zetia .  Will reassess at follow-up.

## 2023-12-20 ENCOUNTER — Other Ambulatory Visit: Payer: Self-pay | Admitting: *Deleted

## 2023-12-20 DIAGNOSIS — M8589 Other specified disorders of bone density and structure, multiple sites: Secondary | ICD-10-CM

## 2023-12-20 DIAGNOSIS — Z79899 Other long term (current) drug therapy: Secondary | ICD-10-CM

## 2023-12-21 DIAGNOSIS — M8589 Other specified disorders of bone density and structure, multiple sites: Secondary | ICD-10-CM | POA: Diagnosis not present

## 2023-12-21 DIAGNOSIS — E785 Hyperlipidemia, unspecified: Secondary | ICD-10-CM | POA: Diagnosis not present

## 2023-12-21 DIAGNOSIS — Z8379 Family history of other diseases of the digestive system: Secondary | ICD-10-CM | POA: Diagnosis not present

## 2023-12-21 DIAGNOSIS — Z79899 Other long term (current) drug therapy: Secondary | ICD-10-CM | POA: Diagnosis not present

## 2023-12-22 LAB — CBC WITH DIFFERENTIAL/PLATELET
Basophils Absolute: 0 10*3/uL (ref 0.0–0.2)
Basos: 0 %
EOS (ABSOLUTE): 0.1 10*3/uL (ref 0.0–0.4)
Eos: 2 %
Hematocrit: 42 % (ref 34.0–46.6)
Hemoglobin: 14.2 g/dL (ref 11.1–15.9)
Immature Grans (Abs): 0 10*3/uL (ref 0.0–0.1)
Immature Granulocytes: 0 %
Lymphocytes Absolute: 1.6 10*3/uL (ref 0.7–3.1)
Lymphs: 33 %
MCH: 31.3 pg (ref 26.6–33.0)
MCHC: 33.8 g/dL (ref 31.5–35.7)
MCV: 93 fL (ref 79–97)
Monocytes Absolute: 0.4 10*3/uL (ref 0.1–0.9)
Monocytes: 9 %
Neutrophils Absolute: 2.7 10*3/uL (ref 1.4–7.0)
Neutrophils: 56 %
Platelets: 202 10*3/uL (ref 150–450)
RBC: 4.54 x10E6/uL (ref 3.77–5.28)
RDW: 14.2 % (ref 11.7–15.4)
WBC: 4.9 10*3/uL (ref 3.4–10.8)

## 2023-12-22 LAB — COMPREHENSIVE METABOLIC PANEL WITH GFR
ALT: 15 IU/L (ref 0–32)
AST: 20 IU/L (ref 0–40)
Albumin: 4.4 g/dL (ref 3.8–4.8)
Alkaline Phosphatase: 64 IU/L (ref 44–121)
BUN/Creatinine Ratio: 20 (ref 12–28)
BUN: 17 mg/dL (ref 8–27)
Bilirubin Total: 0.5 mg/dL (ref 0.0–1.2)
CO2: 22 mmol/L (ref 20–29)
Calcium: 10.3 mg/dL (ref 8.7–10.3)
Chloride: 103 mmol/L (ref 96–106)
Creatinine, Ser: 0.86 mg/dL (ref 0.57–1.00)
Globulin, Total: 2.3 g/dL (ref 1.5–4.5)
Glucose: 88 mg/dL (ref 70–99)
Potassium: 4.2 mmol/L (ref 3.5–5.2)
Sodium: 140 mmol/L (ref 134–144)
Total Protein: 6.7 g/dL (ref 6.0–8.5)
eGFR: 72 mL/min/{1.73_m2} (ref >59)

## 2023-12-22 LAB — VITAMIN D 25 HYDROXY (VIT D DEFICIENCY, FRACTURES): Vit D, 25-Hydroxy: 49.1 ng/mL (ref 30.0–100.0)

## 2023-12-23 NOTE — Progress Notes (Signed)
 CBC and CMP are normal.  Vitamin D  is within normal limits and in the desirable range.

## 2023-12-24 ENCOUNTER — Encounter: Payer: Self-pay | Admitting: Family Medicine

## 2023-12-25 LAB — LIPID PANEL
Chol/HDL Ratio: 3.2 ratio (ref 0.0–4.4)
Cholesterol, Total: 168 mg/dL (ref 100–199)
HDL: 53 mg/dL (ref >39)
LDL Chol Calc (NIH): 105 mg/dL — ABNORMAL HIGH (ref 0–99)
Triglycerides: 49 mg/dL (ref 0–149)
VLDL Cholesterol Cal: 10 mg/dL (ref 5–40)

## 2023-12-25 LAB — CELIAC AB TTG DGP TIGA
Antigliadin Abs, IgA: 3 U (ref 0–19)
Gliadin IgG: 1 U (ref 0–19)
IgA/Immunoglobulin A, Serum: 390 mg/dL (ref 64–422)

## 2023-12-27 ENCOUNTER — Ambulatory Visit: Payer: PPO | Admitting: Family Medicine

## 2024-01-07 NOTE — Progress Notes (Unsigned)
 Office Visit Note  Patient: [name]             Date of Birth: 24-Apr-1951           MRN: [id number]             PCP: Cook, Jayce G, DO Referring: Cook, Jayce G, DO Visit Date: 01/17/2024 Occupation: @GUAROCC @  Subjective:  No chief complaint on file.   History of Present Illness: [name] is a 73 y.o. female ***     Activities of Daily Living:  Patient reports morning stiffness for *** {minute/hour:19697}.   Patient {ACTIONS;DENIES/REPORTS:21021675::"Denies"} nocturnal pain.  Difficulty dressing/grooming: {ACTIONS;DENIES/REPORTS:21021675::"Denies"} Difficulty climbing stairs: {ACTIONS;DENIES/REPORTS:21021675::"Denies"} Difficulty getting out of chair: {ACTIONS;DENIES/REPORTS:21021675::"Denies"} Difficulty using hands for taps, buttons, cutlery, and/or writing: {ACTIONS;DENIES/REPORTS:21021675::"Denies"}  No Rheumatology ROS completed.   PMFS History:  Patient Active Problem List   Diagnosis Date Noted   HTN (hypertension) 09/26/2023   Lipedema 01/19/2023   Coronary atherosclerosis 10/25/2022   Aortic atherosclerosis (HCC) 10/25/2022   Moderate aortic valve stenosis 06/29/2022   Osteoporosis 06/28/2022   Anxiety 06/28/2021   GERD (gastroesophageal reflux disease) 06/28/2021   History of total knee replacement, bilateral 10/30/2018   Seropositive rheumatoid arthritis of multiple sites (HCC) 07/20/2014   Obesity (BMI 30-39.9) 12/19/2007   DDD (degenerative disc disease), lumbar 06/27/2007   Hyperlipidemia 02/21/2007    Past Medical History:  Diagnosis Date   Allergic rhinitis    Allergy    Seasonal. Fall   Anxiety    Arthritis    Asthma    Constipation    Depression    Essential hypertension 04/04/2007   Gallbladder problem    Hematuria 08/25/2015   Hyperlipidemia    Hypertension    Impetigo    in nose per patient, dx by derm.   Obesity    Osteoarthritis    Osteoporosis 06/28/2022   Pelvic pain in female 08/25/2015   Rheumatoid arthritis  (HCC)    Swallowing difficulty    Swelling     Family History  Problem Relation Age of Onset   Leukemia Mother    Hypertension Mother    Cancer Mother    Depression Mother    Early death Mother    Miscarriages / Stillbirths Mother    Heart attack Father    Alcoholism Father    Depression Father    Heart disease Father    Hyperlipidemia Father    Hypertension Father    Cancer Sister    Obesity Sister    Other Sister        breathing problems   Hepatitis C Sister    Arthritis Sister    Depression Sister    Colon cancer Sister    Squamous cell carcinoma Sister    Obesity Sister    Cancer Sister    Arthritis Sister    Depression Sister    Squamous cell carcinoma Sister    Emphysema Brother    Other Brother        aneursym   Vision loss Maternal Aunt    Cancer Maternal Grandmother        breast   Obesity Maternal Grandmother    Obesity Daughter    Polycystic ovary syndrome Daughter    Depression Daughter    Obesity Son    Other Son        knee problems   Obesity Son    Asthma Son    Depression Son    Obesity Son    Depression Son  Obesity Son    Depression Son    Obesity Son    Diabetes Other        runs on dad's side of the family   Past Surgical History:  Procedure Laterality Date   BACK SURGERY     CHOLECYSTECTOMY     COLONOSCOPY  2007   sessile sigmoid polyp x 1; path: serrated adenoma   COLONOSCOPY N/A 10/12/2014   Procedure: COLONOSCOPY;  Surgeon: Alyce Jubilee, MD;  Location: [facility];  Service: Endoscopy;  Laterality: N/A;   COLONOSCOPY N/A 03/05/2020   Procedure: COLONOSCOPY;  Surgeon: Suzette Espy, MD;  Location: [facility];  Service: Endoscopy;  Laterality: N/A;  9:30   EYE SURGERY  2019   Cataract   JOINT REPLACEMENT     BIL knee    KNEE ARTHROPLASTY     KNEE SURGERY     bilateral knee replacement   LIPOMA EXCISION     x 2   SPINE SURGERY  2008   Rod L4-L5   TONSILLECTOMY AND ADENOIDECTOMY     Social History    Social History Narrative   Not on file   Immunization History  Administered Date(s) Administered   Fluad Quad(high Dose 65+) 05/27/2020, 05/12/2022   Influenza Split 06/17/2013   Influenza Whole 06/27/2007, 05/08/2008   Influenza, High Dose Seasonal PF 06/14/2017, 06/18/2018   Influenza,inj,Quad PF,6+ Mos 07/12/2015   Influenza-Unspecified 06/14/2017, 06/18/2018, 05/11/2019, 05/14/2021   PFIZER(Purple Top)SARS-COV-2 Vaccination 10/12/2019, 11/04/2019, 06/03/2020, 05/23/2021   PNEUMOCOCCAL CONJUGATE-20 12/26/2021, 06/05/2022   Pfizer(Comirnaty)Fall Seasonal Vaccine 12 years and older 05/12/2022   Pneumococcal Polysaccharide-23 02/18/1993, 06/27/2007   Zoster Recombinant(Shingrix) 08/19/2018, 06/05/2022     Objective: Vital Signs: There were no vitals taken for this visit.   Physical Exam   Musculoskeletal Exam: ***  CDAI Exam: CDAI Score: -- Patient Global: --; Provider Global: -- Swollen: --; Tender: -- Joint Exam 01/17/2024   No joint exam has been documented for this visit   There is currently no information documented on the homunculus. Go to the Rheumatology activity and complete the homunculus joint exam.  Investigation: No additional findings.  Imaging: No results found.  Recent Labs: Lab Results  Component Value Date   WBC 4.9 12/21/2023   HGB 14.2 12/21/2023   PLT 202 12/21/2023   NA 140 12/21/2023   K 4.2 12/21/2023   CL 103 12/21/2023   CO2 22 12/21/2023   GLUCOSE 88 12/21/2023   BUN 17 12/21/2023   CREATININE 0.86 12/21/2023   BILITOT 0.5 12/21/2023   ALKPHOS 64 12/21/2023   AST 20 12/21/2023   ALT 15 12/21/2023   PROT 6.7 12/21/2023   ALBUMIN 4.4 12/21/2023   CALCIUM  10.3 12/21/2023   GFRAA 73 07/23/2020   QFTBGOLDPLUS Negative 08/20/2023    Speciality Comments: PLQ Eye Exam: 12/03/2019 WNL @ [facility] Follow up in 1 year MTX -dcd 09/19 Enbrel  start date 03/10/20  Procedures:  No procedures performed Allergies: Codeine,  Cyclosporine, and Propylene glycol   Assessment / Plan:     Visit Diagnoses: No diagnosis found.  Orders: No orders of the defined types were placed in this encounter.  No orders of the defined types were placed in this encounter.   Face-to-face time spent with patient was *** minutes. Greater than 50% of time was spent in counseling and coordination of care.  Follow-Up Instructions: No follow-ups on file.   Dee Farber, CMA  Note - This record has been created using Animal nutritionist.  Chart creation errors have been sought, but may not always  have been located. Such creation errors do not reflect on  the standard of medical care.

## 2024-01-10 ENCOUNTER — Ambulatory Visit (INDEPENDENT_AMBULATORY_CARE_PROVIDER_SITE_OTHER): Admitting: Family Medicine

## 2024-01-10 VITALS — BP 111/71 | HR 68 | Temp 98.0°F | Ht 63.0 in | Wt 238.0 lb

## 2024-01-10 DIAGNOSIS — Z6841 Body Mass Index (BMI) 40.0 and over, adult: Secondary | ICD-10-CM

## 2024-01-10 DIAGNOSIS — I10 Essential (primary) hypertension: Secondary | ICD-10-CM | POA: Diagnosis not present

## 2024-01-10 DIAGNOSIS — E785 Hyperlipidemia, unspecified: Secondary | ICD-10-CM

## 2024-01-10 NOTE — Progress Notes (Unsigned)
 SUBJECTIVE:  Chief Complaint: Obesity  Interim History: Patient here for first appointment since December.  She is dealing with political anxiety, garden is planted and is flourishing.  She joined the [employer] again and she enjoys going to exercise classes they offer there.  She has been in counseling and feels she has made progress.  She logged into My[employer]itnessPal for the first time in over a year and signed up for flexitarian plan and set herself up to be around 1200 calories a day but did not adjust the macros.  She has not been logging because they haven't ordered groceries yet.  [name] is here to discuss her progress with her obesity treatment plan. She is on the Full plate method and states she is following her eating plan approximately 0 % of the time. She states she is exercising 45 minutes 4 times per week.   OBJECTIVE: Visit Diagnoses: Problem List Items Addressed This Visit       Cardiovascular and Mediastinum   HTN (hypertension) - Primary (Chronic)   BP very well controlled today.  No chest pain, chest pressure or headache.  She is on lisinopril  daily at 2.5mg .  She does not need a refill and denies any side effects since starting medication.        Other   Hyperlipidemia (Chronic)   Other Visit Diagnoses       BMI 40.0-44.9, adult (HCC)         Obesity with starting BMI of 47.3           Vitals Temp: 98 F (36.7 C) BP: 111/71 Pulse Rate: 68 SpO2: 97 %   Anthropometric Measurements Height: 5\' 3"  (1.6 m) Weight: 238 lb (108 kg) BMI (Calculated): 42.17 Weight at Last Visit: 238 lb Weight Lost Since Last Visit: 0 Weight Gained Since Last Visit: 0 Starting Weight: 267 lb Total Weight Loss (lbs): 0 lb (0 kg)   Body Composition  Body Fat %: 55.9 % Fat Mass (lbs): 133.4 lbs Muscle Mass (lbs): 99.8 lbs Visceral Fat Rating : 20   Other Clinical Data Today's Visit #: 84 Starting Date: 10/31/18 Comments: Full plate     ASSESSMENT AND  PLAN:  Diet: [name] is currently in the action stage of change. As such, her goal is to continue with weight loss efforts and has agreed to keeping a food journal and adhering to recommended goals of 1200 calories and 75 or more grams protein daily. Patient to start food log or journaling meal plan.  The initial goal will be to habitually log or journal for at least 4 days a week.  The expectation it that patient may not initially meet calorie or protein goals as the nutritional understanding of food intake is begun.  We discussed the 10:1 ratio when reading a food label.  Patient agrees to keep a food log either electronically or on paper and bring to the next appointment to be able to dissect and discuss it with provider. We talked about changing macros on My Fitness Pal to 45% carbs, 30% protein, 25% fat.   Exercise:  Older adults should follow the adult guidelines. When older adults cannot meet the adult guidelines, they should be as physically active as their abilities and conditions will allow.  Behavior Modification:  We discussed the following Behavioral Modification Strategies today: increasing lean protein intake, decreasing simple carbohydrates, increasing vegetables, meal planning and cooking strategies, and keeping healthy foods in the home.   No follow-ups on file.   She  was informed of the importance of frequent follow up visits to maximize her success with intensive lifestyle modifications for her multiple health conditions.  Attestation Statements:   Reviewed by clinician on day of visit: allergies, medications, problem list, medical history, surgical history, family history, social history, and previous encounter notes.   Donaciano Frizzle, MD

## 2024-01-10 NOTE — Assessment & Plan Note (Signed)
 BP very well controlled today.  No chest pain, chest pressure or headache.  She is on lisinopril  daily at 2.5mg .  She does not need a refill and denies any side effects since starting medication.

## 2024-01-15 NOTE — Assessment & Plan Note (Signed)
 Anthropometric Measurements Height: 5\' 3"  (1.6 m) Weight: 238 lb (108 kg) BMI (Calculated): 42.17 Weight at Last Visit: 238 lb Weight Lost Since Last Visit: 0 Weight Gained Since Last Visit: 0 Starting Weight: 267 lb Total Weight Loss (lbs): 0 lb (0 kg) Body Composition  Body Fat %: 55.9 % Fat Mass (lbs): 133.4 lbs Muscle Mass (lbs): 99.8 lbs Visceral Fat Rating : 20 Other Clinical Data Today's Visit #: 44 Starting Date: 10/31/18 Comments: Full plate

## 2024-01-15 NOTE — Assessment & Plan Note (Signed)
 Patient working on flexicatarian meal plan and limiting saturated fat.  She has chronic elevated cholesterol that she takes 80mg  of lipitor for.  Will discuss next set of labs when patient gets re-evaluated by PCP.

## 2024-01-17 ENCOUNTER — Ambulatory Visit: Payer: PPO | Attending: Rheumatology | Admitting: Rheumatology

## 2024-01-17 ENCOUNTER — Encounter: Payer: Self-pay | Admitting: Rheumatology

## 2024-01-17 VITALS — BP 132/86 | HR 69 | Resp 14 | Ht 63.0 in | Wt 241.0 lb

## 2024-01-17 DIAGNOSIS — M7061 Trochanteric bursitis, right hip: Secondary | ICD-10-CM

## 2024-01-17 DIAGNOSIS — Z860101 Personal history of adenomatous and serrated colon polyps: Secondary | ICD-10-CM | POA: Diagnosis not present

## 2024-01-17 DIAGNOSIS — M0579 Rheumatoid arthritis with rheumatoid factor of multiple sites without organ or systems involvement: Secondary | ICD-10-CM

## 2024-01-17 DIAGNOSIS — M8589 Other specified disorders of bone density and structure, multiple sites: Secondary | ICD-10-CM | POA: Diagnosis not present

## 2024-01-17 DIAGNOSIS — Z8709 Personal history of other diseases of the respiratory system: Secondary | ICD-10-CM

## 2024-01-17 DIAGNOSIS — Z8614 Personal history of Methicillin resistant Staphylococcus aureus infection: Secondary | ICD-10-CM

## 2024-01-17 DIAGNOSIS — M51369 Other intervertebral disc degeneration, lumbar region without mention of lumbar back pain or lower extremity pain: Secondary | ICD-10-CM | POA: Diagnosis not present

## 2024-01-17 DIAGNOSIS — Z8659 Personal history of other mental and behavioral disorders: Secondary | ICD-10-CM

## 2024-01-17 DIAGNOSIS — M25511 Pain in right shoulder: Secondary | ICD-10-CM | POA: Diagnosis not present

## 2024-01-17 DIAGNOSIS — I10 Essential (primary) hypertension: Secondary | ICD-10-CM | POA: Diagnosis not present

## 2024-01-17 DIAGNOSIS — Z79899 Other long term (current) drug therapy: Secondary | ICD-10-CM

## 2024-01-17 DIAGNOSIS — Z96653 Presence of artificial knee joint, bilateral: Secondary | ICD-10-CM | POA: Diagnosis not present

## 2024-01-17 DIAGNOSIS — Z8639 Personal history of other endocrine, nutritional and metabolic disease: Secondary | ICD-10-CM

## 2024-01-17 DIAGNOSIS — M25512 Pain in left shoulder: Secondary | ICD-10-CM

## 2024-01-17 DIAGNOSIS — G8929 Other chronic pain: Secondary | ICD-10-CM

## 2024-01-17 DIAGNOSIS — R600 Localized edema: Secondary | ICD-10-CM

## 2024-02-26 ENCOUNTER — Encounter: Payer: Self-pay | Admitting: Family Medicine

## 2024-02-28 ENCOUNTER — Encounter: Payer: Self-pay | Admitting: Family Medicine

## 2024-03-03 ENCOUNTER — Encounter (INDEPENDENT_AMBULATORY_CARE_PROVIDER_SITE_OTHER): Payer: Self-pay | Admitting: Family Medicine

## 2024-03-03 ENCOUNTER — Ambulatory Visit (INDEPENDENT_AMBULATORY_CARE_PROVIDER_SITE_OTHER): Admitting: Family Medicine

## 2024-03-03 ENCOUNTER — Other Ambulatory Visit: Payer: Self-pay | Admitting: Family Medicine

## 2024-03-03 VITALS — BP 116/75 | HR 67 | Temp 98.0°F | Ht 63.0 in | Wt 233.0 lb

## 2024-03-03 DIAGNOSIS — M51362 Other intervertebral disc degeneration, lumbar region with discogenic back pain and lower extremity pain: Secondary | ICD-10-CM

## 2024-03-03 DIAGNOSIS — Z6841 Body Mass Index (BMI) 40.0 and over, adult: Secondary | ICD-10-CM

## 2024-03-03 DIAGNOSIS — R609 Edema, unspecified: Secondary | ICD-10-CM

## 2024-03-03 DIAGNOSIS — E669 Obesity, unspecified: Secondary | ICD-10-CM

## 2024-03-03 NOTE — Progress Notes (Signed)
   SUBJECTIVE:  Chief Complaint: Obesity  Interim History: Patient here for follow up.  She has been following the plan more consistently and has started being more consistent with her exercise. She is getting mostly vegetable protein in.  She really can't tolerate much animal protein but is getting more plant protein.  She has added quinoa into her intake more than she did previously.   She is interested incorporating tempeh. She is averaging 28-30% of her calories to be protein.  Has felt some deprivation with her intake.   [name] is here to discuss her progress with her obesity treatment plan. She is on the keeping a food journal and adhering to recommended goals of 1200 calories and 75 grams of protein and states she is following her eating plan approximately 90 % of the time. She states she is exercising 60 minutes 7 times per week.   OBJECTIVE: Visit Diagnoses: Problem List Items Addressed This Visit       Musculoskeletal and Integument   DDD (degenerative disc disease), lumbar - Primary     Other   Morbid obesity (HCC)   Other Visit Diagnoses       BMI 40.0-44.9, adult (HCC)           No data recorded       03/13/2024    3:31 PM 03/13/2024    3:04 PM 03/04/2024   11:22 AM  Vitals with BMI  Height  5' 4 5' 3  Weight  239 lbs 242 lbs  BMI  41 42.88  Systolic 129 167 889  Diastolic 80 75 72  Pulse 80 80 71      ASSESSMENT AND PLAN: Assessment & Plan Degeneration of intervertebral disc of lumbar region with discogenic back pain and lower extremity pain Patient working with PT and incorporation of exercises that are tolerable with her with her pain and ability.  Follow up on physical activity ability at next appointment. Obesity with starting BMI of 47.3 Anthropometric Measurements Height: 5' 3 (1.6 m) Weight: 233 lb (105.7 kg) BMI (Calculated): 41.28 Weight at Last Visit: 238 lb Weight Lost Since Last Visit: 5 Weight Gained Since Last Visit: 0 Starting  Weight: 267 lb Total Weight Loss (lbs): 34 lb (15.4 kg) Body Composition  Body Fat %: 44.3 % Fat Mass (lbs): 103.6 lbs Muscle Mass (lbs): 106.8 lbs Total Body Water  (lbs): 106.8 lbs Visceral Fat Rating : 16 Other Clinical Data Today's Visit #: 45 Starting Date: 10/31/18 Comments: 1200/75  BMI 40.0-44.9, adult (HCC)    Diet: Jahari is currently in the action stage of change. As such, her goal is to continue with weight loss efforts and has agreed to keeping a food journal and adhering to recommended goals of 1200 calories and 75 or more grams of protein daily.   Exercise:  Older adults should determine their level of effort for physical activity relative to their level of fitness.  Behavior Modification:  We discussed the following Behavioral Modification Strategies today: increasing lean protein intake, decreasing simple carbohydrates, increasing vegetables, and keep a strict food journal.   Return in about 7 weeks (around 04/21/2024).   She was informed of the importance of frequent follow up visits to maximize her success with intensive lifestyle modifications for her multiple health conditions.  Attestation Statements:   Reviewed by clinician on day of visit: allergies, medications, problem list, medical history, surgical history, family history, social history, and previous encounter notes.     Adelita Cho, MD

## 2024-03-04 ENCOUNTER — Ambulatory Visit: Payer: Self-pay

## 2024-03-04 ENCOUNTER — Ambulatory Visit: Admitting: Family Medicine

## 2024-03-04 ENCOUNTER — Encounter: Payer: Self-pay | Admitting: Family Medicine

## 2024-03-04 VITALS — BP 110/72 | HR 71 | Temp 96.6°F | Ht 63.0 in | Wt 242.0 lb

## 2024-03-04 DIAGNOSIS — M069 Rheumatoid arthritis, unspecified: Secondary | ICD-10-CM

## 2024-03-04 MED ORDER — PREDNISONE 10 MG PO TABS: ORAL_TABLET | ORAL | 0 refills | Status: DC

## 2024-03-04 NOTE — Telephone Encounter (Signed)
 Appointment scheduled.

## 2024-03-04 NOTE — Progress Notes (Signed)
 Subjective:  Patient ID: [name], female    DOB: 28-May-1951  Age: 73 y.o. MRN: [id number]  CC:   Chief Complaint  Patient presents with   right writ pain and swelling     Since last night took tramadol  - spreads up towards arm and R shoulder     HPI:  73 year old female with rheumatoid arthritis presents with the above complaint.  Started yesterday/last night.  Reports right wrist pain and swelling.  Pain radiates upward.  Denies any injury.  Took tramadol  earlier this morning due to the pain.  Patient is not on any current medication regarding rheumatoid arthritis.  No other reported symptoms.  No other complaints.  Patient Active Problem List   Diagnosis Date Noted   Rheumatoid arthritis flare (HCC) 03/04/2024   Morbid obesity (HCC) 01/15/2024   HTN (hypertension) 09/26/2023   Lipedema 01/19/2023   Coronary atherosclerosis 10/25/2022   Aortic atherosclerosis (HCC) 10/25/2022   Moderate aortic valve stenosis 06/29/2022   Osteoporosis 06/28/2022   Anxiety 06/28/2021   GERD (gastroesophageal reflux disease) 06/28/2021   History of total knee replacement, bilateral 10/30/2018   Seropositive rheumatoid arthritis of multiple sites (HCC) 07/20/2014   Obesity (BMI 30-39.9) 12/19/2007   DDD (degenerative disc disease), lumbar 06/27/2007   Hyperlipidemia 02/21/2007    Social Hx   Social History   Socioeconomic History   Marital status: Married    Spouse name: Ruther Ephraim   Number of children: 4   Years of education: Not on file   Highest education level: Master's degree (e.g., MA, MS, MEng, MEd, MSW, MBA)  Occupational History   Occupation: Stay at home  Tobacco Use   Smoking status: Never    Passive exposure: Never   Smokeless tobacco: Never  Vaping Use   Vaping status: Never Used  Substance and Sexual Activity   Alcohol use: Yes    Comment: 1 drink occasionally   Drug use: No   Sexual activity: Yes    Birth control/protection: Post-menopausal  Other  Topics Concern   Not on file  Social History Narrative   Not on file   Social Drivers of Health   Financial Resource Strain: Low Risk  (11/30/2023)   Overall Financial Resource Strain (CARDIA)    Difficulty of Paying Living Expenses: Not very hard  Food Insecurity: No Food Insecurity (11/30/2023)   Hunger Vital Sign    Worried About Running Out of Food in the Last Year: Never true    Ran Out of Food in the Last Year: Never true  Transportation Needs: No Transportation Needs (11/30/2023)   PRAPARE - Administrator, Civil Service (Medical): No    Lack of Transportation (Non-Medical): No  Physical Activity: Sufficiently Active (11/30/2023)   Exercise Vital Sign    Days of Exercise per Week: 3 days    Minutes of Exercise per Session: 50 min  Stress: Stress Concern Present (11/30/2023)   [employer] of Occupational Health - Occupational Stress Questionnaire    Feeling of Stress : Very much  Social Connections: Socially Integrated (11/30/2023)   Social Connection and Isolation Panel    Frequency of Communication with Friends and Family: More than three times a week    Frequency of Social Gatherings with Friends and Family: Three times a week    Attends Religious Services: More than 4 times per year    Active Member of Clubs or Organizations: Yes    Attends Banker Meetings: More than 4 times per  year    Marital Status: Married    Review of Systems Per HPI  Objective:  BP 110/72   Pulse 71   Temp (!) 96.6 F (35.9 C)   Ht 5' 3 (1.6 m)   Wt 242 lb (109.8 kg)   SpO2 97%   BMI 42.87 kg/m      03/04/2024   11:22 AM 03/03/2024   10:00 AM 01/17/2024    1:11 PM  BP/Weight  Systolic BP 110 116 132  Diastolic BP 72 75 86  Wt. (Lbs) 242 233 241  BMI 42.87 kg/m2 41.27 kg/m2 42.69 kg/m2    Physical Exam Constitutional:      General: She is not in acute distress.    Appearance: Normal appearance.  HENT:     Head: Normocephalic and atraumatic.   Cardiovascular:     Rate and Rhythm: Normal rate and regular rhythm.     Heart sounds: Murmur heard.  Pulmonary:     Effort: Pulmonary effort is normal. No respiratory distress.  Musculoskeletal:     Comments: Right wrist with tenderness to palpation.  Mild swelling.  Neurological:     Mental Status: She is alert.  Psychiatric:        Mood and Affect: Mood normal.        Behavior: Behavior normal.     Lab Results  Component Value Date   WBC 4.9 12/21/2023   HGB 14.2 12/21/2023   HCT 42.0 12/21/2023   PLT 202 12/21/2023   GLUCOSE 88 12/21/2023   CHOL 168 12/21/2023   TRIG 49 12/21/2023   HDL 53 12/21/2023   LDLCALC 105 (H) 12/21/2023   ALT 15 12/21/2023   AST 20 12/21/2023   NA 140 12/21/2023   K 4.2 12/21/2023   CL 103 12/21/2023   CREATININE 0.86 12/21/2023   BUN 17 12/21/2023   CO2 22 12/21/2023   TSH 2.53 03/17/2020   INR 1.75 (H) 03/03/2010   HGBA1C 5.4 02/27/2019     Assessment & Plan:  Rheumatoid arthritis flare (HCC) Assessment & Plan: Acute exacerbation/flare.  Treating with prednisone .  Orders: -     predniSONE ; 50 mg daily x 2 days, then 40 mg daily x 2 days, then 30 mg daily x 2 days, then 20 mg daily x 2 days, then 10 mg daily x 2 days.  Dispense: 30 tablet; Refill: 0    Follow-up:  Return if symptoms worsen or fail to improve.  Jacqulyn Ahle DO [facility]

## 2024-03-04 NOTE — Assessment & Plan Note (Signed)
 Acute exacerbation/flare.  Treating with prednisone .

## 2024-03-04 NOTE — Patient Instructions (Signed)
 Prednisone  as prescribed.  If you continue to have issues after the taper, contact rheumatology.  Take care  Dr. Bluford

## 2024-03-04 NOTE — Telephone Encounter (Signed)
 FYI Only or Action Required?: FYI only for provider.  Patient was last seen in primary care on 03/03/2024 by Catherine Adelita PENNER, MD.  Called Nurse Triage reporting Pain.  Symptoms began yesterday.  Interventions attempted: OTC medications: alieve and Prescription medications: tramadol .  Symptoms are: gradually worsening .  Triage Disposition: See HCP Within 4 Hours (Or PCP Triage)  Patient/caregiver understands and will follow disposition?: Yes   Copied from CRM [id number]. Topic: Clinical - Red Word Triage >> Mar 04, 2024  8:08 AM [name] wrote: Red Word that prompted transfer to Nurse Triage: Possible ruptured tendon in hand, pain level of 8. Reason for Disposition  [1] SEVERE pain (e.g., excruciating, unable to use hand at all) AND [2] not improved after 2 hours of pain medicine  Answer Assessment - Initial Assessment Questions 1. ONSET: When did the pain start?     Yesterday pain became worse 2. LOCATION: Where is the pain located?     Right hand 3. PAIN: How bad is the pain? (Scale 1-10; or mild, moderate, severe)   - MILD (1-3): doesn't interfere with normal activities   - MODERATE (4-7): interferes with normal activities (e.g., work or school) or awakens from sleep   - SEVERE (8-10): excruciating pain, unable to use hand at all     8/10 4. WORK OR EXERCISE: Has there been any recent work or exercise that involved this part (i.e., hand or wrist) of the body?     no 5. CAUSE: What do you think is causing the pain?     Possible tendon rupture 6. AGGRAVATING FACTORS: What makes the pain worse? (e.g., using computer)     bo 7. OTHER SYMPTOMS: Do you have any other symptoms? (e.g., neck pain, swelling, rash, numbness, fever)     Pain, swelling to middle knuckle & thumb 8. PREGNANCY: Is there any chance you are pregnant? When was your last menstrual period?     na  Protocols used: Hand and Wrist Pain-A-AH

## 2024-03-12 ENCOUNTER — Encounter: Payer: Self-pay | Admitting: Rheumatology

## 2024-03-12 NOTE — Telephone Encounter (Signed)
 Patient with needing appointment to discuss medication and obtain consent.  Please schedule an appointment after discussing with the patient.

## 2024-03-12 NOTE — Telephone Encounter (Signed)
 Attempted to contact the patient and left a message to call the office back.

## 2024-03-13 ENCOUNTER — Encounter: Payer: Self-pay | Admitting: Physician Assistant

## 2024-03-13 ENCOUNTER — Ambulatory Visit: Attending: Physician Assistant | Admitting: Physician Assistant

## 2024-03-13 VITALS — BP 129/80 | HR 80 | Resp 17 | Ht 64.0 in | Wt 239.0 lb

## 2024-03-13 DIAGNOSIS — Z8709 Personal history of other diseases of the respiratory system: Secondary | ICD-10-CM

## 2024-03-13 DIAGNOSIS — M51369 Other intervertebral disc degeneration, lumbar region without mention of lumbar back pain or lower extremity pain: Secondary | ICD-10-CM

## 2024-03-13 DIAGNOSIS — Z8659 Personal history of other mental and behavioral disorders: Secondary | ICD-10-CM

## 2024-03-13 DIAGNOSIS — M7061 Trochanteric bursitis, right hip: Secondary | ICD-10-CM | POA: Diagnosis not present

## 2024-03-13 DIAGNOSIS — M8589 Other specified disorders of bone density and structure, multiple sites: Secondary | ICD-10-CM

## 2024-03-13 DIAGNOSIS — Z8614 Personal history of Methicillin resistant Staphylococcus aureus infection: Secondary | ICD-10-CM | POA: Diagnosis not present

## 2024-03-13 DIAGNOSIS — G8929 Other chronic pain: Secondary | ICD-10-CM

## 2024-03-13 DIAGNOSIS — Z79899 Other long term (current) drug therapy: Secondary | ICD-10-CM

## 2024-03-13 DIAGNOSIS — Z96653 Presence of artificial knee joint, bilateral: Secondary | ICD-10-CM | POA: Diagnosis not present

## 2024-03-13 DIAGNOSIS — R600 Localized edema: Secondary | ICD-10-CM

## 2024-03-13 DIAGNOSIS — M25512 Pain in left shoulder: Secondary | ICD-10-CM | POA: Diagnosis not present

## 2024-03-13 DIAGNOSIS — I10 Essential (primary) hypertension: Secondary | ICD-10-CM | POA: Diagnosis not present

## 2024-03-13 DIAGNOSIS — M25511 Pain in right shoulder: Secondary | ICD-10-CM

## 2024-03-13 DIAGNOSIS — M0579 Rheumatoid arthritis with rheumatoid factor of multiple sites without organ or systems involvement: Secondary | ICD-10-CM | POA: Diagnosis not present

## 2024-03-13 DIAGNOSIS — Z860101 Personal history of adenomatous and serrated colon polyps: Secondary | ICD-10-CM

## 2024-03-13 DIAGNOSIS — Z8639 Personal history of other endocrine, nutritional and metabolic disease: Secondary | ICD-10-CM

## 2024-03-13 LAB — COMPREHENSIVE METABOLIC PANEL WITH GFR
AG Ratio: 1.7 (calc) (ref 1.0–2.5)
ALT: 15 U/L (ref 6–29)
AST: 17 U/L (ref 10–35)
Albumin: 4.4 g/dL (ref 3.6–5.1)
Alkaline phosphatase (APISO): 54 U/L (ref 37–153)
BUN: 17 mg/dL (ref 7–25)
CO2: 30 mmol/L (ref 20–32)
Calcium: 10.7 mg/dL — ABNORMAL HIGH (ref 8.6–10.4)
Chloride: 101 mmol/L (ref 98–110)
Creat: 0.93 mg/dL (ref 0.60–1.00)
Globulin: 2.6 g/dL (ref 1.9–3.7)
Glucose, Bld: 119 mg/dL — ABNORMAL HIGH (ref 65–99)
Potassium: 4.1 mmol/L (ref 3.5–5.3)
Sodium: 142 mmol/L (ref 135–146)
Total Bilirubin: 0.8 mg/dL (ref 0.2–1.2)
Total Protein: 7 g/dL (ref 6.1–8.1)
eGFR: 65 mL/min/1.73m2 (ref >=60)

## 2024-03-13 LAB — CBC WITH DIFFERENTIAL/PLATELET
Absolute Lymphocytes: 3146 {cells}/uL (ref 850–3900)
Absolute Monocytes: 846 {cells}/uL (ref 200–950)
Basophils Absolute: 28 {cells}/uL (ref 0–200)
Basophils Relative: 0.3 %
Eosinophils Absolute: 120 {cells}/uL (ref 15–500)
Eosinophils Relative: 1.3 %
HCT: 48 % — ABNORMAL HIGH (ref 35.0–45.0)
Hemoglobin: 15.5 g/dL (ref 11.7–15.5)
MCH: 30.9 pg (ref 27.0–33.0)
MCHC: 32.3 g/dL (ref 32.0–36.0)
MCV: 95.6 fL (ref 80.0–100.0)
MPV: 9.7 fL (ref 7.5–12.5)
Monocytes Relative: 9.2 %
Neutro Abs: 5060 {cells}/uL (ref 1500–7800)
Neutrophils Relative %: 55 %
Platelets: 220 Thousand/uL (ref 140–400)
RBC: 5.02 Million/uL (ref 3.80–5.10)
RDW: 13.4 % (ref 11.0–15.0)
Total Lymphocyte: 34.2 %
WBC: 9.2 Thousand/uL (ref 3.8–10.8)

## 2024-03-13 MED ORDER — PREDNISONE 5 MG PO TABS
ORAL_TABLET | ORAL | 0 refills | Status: DC
Start: 2024-03-13 — End: 2024-04-28

## 2024-03-13 NOTE — Progress Notes (Signed)
 Office Visit Note  Patient: [name]             Date of Birth: 1951-07-28           MRN: [id number]             PCP: Cook, Jayce G, DO Referring: Cook, Jayce G, DO Visit Date: 03/13/2024 Occupation: @GUAROCC @  Subjective:  Flare   History of Present Illness: [name] is a 73 y.o. female with history of seropositive rheumatoid arthritis.  Patient states for the past 1 month she has been experiencing symptoms consistent with a rheumatoid arthritis flare.  Patient states that her symptoms have been most severe involving the right wrist and both feet.  Patient states that she was evaluated by her PCP and was provided a prednisone  taper x 10 days.  Patient states that she took her last dose of prednisone  yesterday but states that her symptoms started to recur as she was reducing the dose of prednisone .  Patient states that her swelling remains well-controlled but she has had increased pain in the right wrist, right elbow, and right shoulder.  She has presented today to discuss resuming Plaquenil  which she has taken in the past.   Patient states that she has been running low-grade fevers due to her rheumatoid arthritis being active.   Activities of Daily Living:  Patient reports morning stiffness for 30 minutes.   Patient Denies nocturnal pain.  Difficulty dressing/grooming: Denies Difficulty climbing stairs: Denies Difficulty getting out of chair: Denies Difficulty using hands for taps, buttons, cutlery, and/or writing: Reports  Review of Systems  Constitutional:  Positive for fatigue.  HENT:  Positive for mouth dryness. Negative for mouth sores.   Eyes:  Positive for dryness.  Respiratory:  Negative for shortness of breath.   Cardiovascular:  Negative for chest pain and palpitations.  Gastrointestinal:  Negative for blood in stool, constipation and diarrhea.  Endocrine: Positive for increased urination.  Genitourinary:  Negative for involuntary urination.   Musculoskeletal:  Positive for joint pain, gait problem, joint pain, joint swelling, myalgias, muscle weakness, morning stiffness, muscle tenderness and myalgias.  Skin:  Positive for sensitivity to sunlight. Negative for color change, rash and hair loss.  Allergic/Immunologic: Negative for susceptible to infections.  Neurological:  Positive for dizziness and headaches.  Hematological:  Negative for swollen glands.  Psychiatric/Behavioral:  Positive for sleep disturbance. Negative for depressed mood. The patient is nervous/anxious.     PMFS History:  Patient Active Problem List   Diagnosis Date Noted   Rheumatoid arthritis flare (HCC) 03/04/2024   Morbid obesity (HCC) 01/15/2024   HTN (hypertension) 09/26/2023   Lipedema 01/19/2023   Coronary atherosclerosis 10/25/2022   Aortic atherosclerosis (HCC) 10/25/2022   Moderate aortic valve stenosis 06/29/2022   Osteoporosis 06/28/2022   Anxiety 06/28/2021   GERD (gastroesophageal reflux disease) 06/28/2021   History of total knee replacement, bilateral 10/30/2018   Seropositive rheumatoid arthritis of multiple sites (HCC) 07/20/2014   Obesity (BMI 30-39.9) 12/19/2007   DDD (degenerative disc disease), lumbar 06/27/2007   Hyperlipidemia 02/21/2007    Past Medical History:  Diagnosis Date   Allergic rhinitis    Allergy    Seasonal. Fall   Anxiety    Arthritis    Asthma    Constipation    Depression    Essential hypertension 04/04/2007   Gallbladder problem    Hematuria 08/25/2015   Hyperlipidemia    Hypertension    Impetigo    in nose per patient,  dx by derm.   Obesity    Osteoarthritis    Osteoporosis 06/28/2022   Pelvic pain in female 08/25/2015   Rheumatoid arthritis (HCC)    Swallowing difficulty    Swelling     Family History  Problem Relation Age of Onset   Leukemia Mother    Hypertension Mother    Cancer Mother    Depression Mother    Early death Mother    Miscarriages / Stillbirths Mother    Heart attack  Father    Alcoholism Father    Depression Father    Heart disease Father    Hyperlipidemia Father    Hypertension Father    Cancer Sister    Obesity Sister    Other Sister        breathing problems   Hepatitis C Sister    Arthritis Sister    Depression Sister    Colon cancer Sister    Squamous cell carcinoma Sister    Obesity Sister    Cancer Sister    Arthritis Sister    Depression Sister    Squamous cell carcinoma Sister    Emphysema Brother    Other Brother        aneursym   Vision loss Maternal Aunt    Cancer Maternal Grandmother        breast   Obesity Maternal Grandmother    Obesity Daughter    Polycystic ovary syndrome Daughter    Depression Daughter    Obesity Son    Other Son        knee problems   Obesity Son    Asthma Son    Depression Son    Obesity Son    Depression Son    Obesity Son    Depression Son    Obesity Son    Diabetes Other        runs on dad's side of the family   Past Surgical History:  Procedure Laterality Date   BACK SURGERY     CHOLECYSTECTOMY     COLONOSCOPY  2007   sessile sigmoid polyp x 1; path: serrated adenoma   COLONOSCOPY N/A 10/12/2014   Procedure: COLONOSCOPY;  Surgeon: Margo LITTIE Haddock, MD;  Location: [facility];  Service: Endoscopy;  Laterality: N/A;   COLONOSCOPY N/A 03/05/2020   Procedure: COLONOSCOPY;  Surgeon: Shaaron Lamar HERO, MD;  Location: [facility];  Service: Endoscopy;  Laterality: N/A;  9:30   EYE SURGERY  2019   Cataract   JOINT REPLACEMENT     BIL knee    KNEE ARTHROPLASTY     KNEE SURGERY     bilateral knee replacement   LIPOMA EXCISION     x 2   SPINE SURGERY  2008   Rod L4-L5   TONSILLECTOMY AND ADENOIDECTOMY     Social History   Social History Narrative   Not on file   Immunization History  Administered Date(s) Administered   Fluad Quad(high Dose 65+) 05/27/2020, 05/12/2022   Influenza Split 06/17/2013   Influenza Whole 06/27/2007, 05/08/2008   Influenza, High Dose Seasonal PF  06/14/2017, 06/18/2018   Influenza,inj,Quad PF,6+ Mos 07/12/2015   Influenza-Unspecified 06/14/2017, 06/18/2018, 05/11/2019, 05/14/2021   PFIZER(Purple Top)SARS-COV-2 Vaccination 10/12/2019, 11/04/2019, 06/03/2020, 05/23/2021   PNEUMOCOCCAL CONJUGATE-20 12/26/2021, 06/05/2022   Pfizer(Comirnaty)Fall Seasonal Vaccine 12 years and older 05/12/2022   Pneumococcal Polysaccharide-23 02/18/1993, 06/27/2007   Zoster Recombinant(Shingrix) 08/19/2018, 06/05/2022     Objective: Vital Signs: BP (!) 167/75 (BP Location: Left Arm, Patient Position: Sitting,  Cuff Size: Normal)   Pulse 80   Resp 17   Ht 5' 4 (1.626 m)   Wt 239 lb (108.4 kg)   BMI 41.02 kg/m    Physical Exam Vitals and nursing note reviewed.  Constitutional:      Appearance: She is well-developed.  HENT:     Head: Normocephalic and atraumatic.  Eyes:     Conjunctiva/sclera: Conjunctivae normal.  Cardiovascular:     Rate and Rhythm: Normal rate and regular rhythm.     Heart sounds: Murmur heard.  Pulmonary:     Effort: Pulmonary effort is normal.     Breath sounds: Normal breath sounds.  Abdominal:     General: Bowel sounds are normal.     Palpations: Abdomen is soft.  Musculoskeletal:     Cervical back: Normal range of motion.  Lymphadenopathy:     Cervical: No cervical adenopathy.  Skin:    General: Skin is warm and dry.     Capillary Refill: Capillary refill takes less than 2 seconds.  Neurological:     Mental Status: She is alert and oriented to person, place, and time.  Psychiatric:        Behavior: Behavior normal.      Musculoskeletal Exam: C-spine is limited range of motion.  Thoracic kyphosis noted.  Discomfort range of motion of the right shoulder.  Tenderness along the right elbow joint line.  Tenderness and warmth of the right wrist.  Synovial thickening and ulnar deviation of MCP joints.  PIP and DIP thickening noted consistent with osteoarthritis of both hands.  Hip joints have good range of motion  with no groin pain.  Bilateral knee replacements have good range of motion with no effusion.  Ankle joints have good range of motion with mild tenderness bilaterally.  Overcrowding of toes noted.  CDAI Exam: CDAI Score: -- Patient Global: --; Provider Global: -- Swollen: --; Tender: -- Joint Exam 03/13/2024   No joint exam has been documented for this visit   There is currently no information documented on the homunculus. Go to the Rheumatology activity and complete the homunculus joint exam.  Investigation: No additional findings.  Imaging: No results found.  Recent Labs: Lab Results  Component Value Date   WBC 4.9 12/21/2023   HGB 14.2 12/21/2023   PLT 202 12/21/2023   NA 140 12/21/2023   K 4.2 12/21/2023   CL 103 12/21/2023   CO2 22 12/21/2023   GLUCOSE 88 12/21/2023   BUN 17 12/21/2023   CREATININE 0.86 12/21/2023   BILITOT 0.5 12/21/2023   ALKPHOS 64 12/21/2023   AST 20 12/21/2023   ALT 15 12/21/2023   PROT 6.7 12/21/2023   ALBUMIN 4.4 12/21/2023   CALCIUM  10.3 12/21/2023   GFRAA 73 07/23/2020   QFTBGOLDPLUS Negative 08/20/2023    Speciality Comments: PLQ Eye Exam: 12/03/2019 WNL @ [facility] Follow up in 1 year PLQ 02/22 MTX -dcd 09/19 Enbrel  start date 03/10/20-08/24  Procedures:  No procedures performed Allergies: Codeine, Cyclosporine, and Propylene glycol   Assessment / Plan:     Visit Diagnoses: Seropositive rheumatoid arthritis of multiple sites (HCC) - positive RF, positive anti-CCP. Previous patient of Dr. Everlean with multiple contractures, ultrasound of both hands on 10/30/18 revealed synovitis: Patient presents today to discuss restarting Plaquenil  due to a recurrence of symptoms consistent with active rheumatoid arthritis.  For the past 1 month she has been experiencing a flare in the right wrist and both feet.  She has also had low-grade  fevers and increased joint stiffness.  Patient was evaluated by her PCP who prescribed a prednisone  taper x  10 days which helped to alleviate her joint pain and joint swelling.  As she was tapering the dose she started to have a recurrence of pain and inflammation especially in her right wrist, right elbow, and right shoulder. Reviewed indications, contraindications, and potential side effects of Plaquenil  today in detail.  All questions were addressed and consent was obtained.  Plan to update CBC with differential and CMP with GFR and if labs are stable a new prescription for Plaquenil  will be sent to the pharmacy.  She will take Plaquenil  200 mg 1 tablet by mouth twice daily.  She will notify us  if she cannot tolerate taking Plaquenil .  To alleviate her current flare and new prednisone  taper starting at 20 mg tapering by 5 mg every 4 days were sent to the pharmacy.  Instructions were provided.  She will follow-up in the office in 2 months to assess her response.  Patient was counseled on the purpose, proper use, and adverse effects of hydroxychloroquine  including nausea/diarrhea, skin rash, headaches, and sun sensitivity.  Advised patient to wear sunscreen once starting hydroxychloroquine  to reduce risk of rash associated with sun sensitivity.  Discussed importance of annual eye exams while on hydroxychloroquine  to monitor to ocular toxicity and discussed importance of frequent laboratory monitoring.  Provided patient with eye exam form for baseline ophthalmologic exam.  Provided patient with educational materials on hydroxychloroquine  and answered all questions.  Patient consented to hydroxychloroquine . Will upload consent in the media tab.    Dose will be Plaquenil  200 mg twice daily.  Prescription pending lab results.  High risk medication use - Plan to reinitiate Plaquenil  200 mg 1 tablet by mouth twice daily.   CBC and CMP updated on 12/21/23.  Orders for CBC and CMP released today.  She will require updated lab work in 1 month, 3 months, then every 5 months. PLQ Eye Exam: 12/03/2019 WNL @ [facility]  Follow up in 1 year. She will need to continue yearly plaquenil  eye examinations due to restarting plaquenil .   Previous treatment: Enbrel  50 mg sq injections once every 10 days-started July 2021-d/c in 8/24. MTX was discontinued in 2019 due to inadequate response.  She was on Plaquenil  with methotrexate  and later with Enbrel  combination therapy.  Plaquenil  was discontinued in September 2019.  Plan: CBC with Differential/Platelet, Comprehensive metabolic panel with GFR  Chronic left shoulder pain: Not currently symptomatic.    Chronic right shoulder pain: She has been experiencing some increased discomfort in her right shoulder over the past few weeks.  On examination she has good range of motion.  History of total knee replacement, bilateral: Doing well.  No effusion noted.  Trochanteric bursitis of right hip: Not currently symptomatic.  Degeneration of intervertebral disc of lumbar region without discogenic back pain or lower extremity pain: Intermittent discomfort.  No symptoms of radiculopathy at this time.  Osteopenia of multiple sites - May 23, 2022 DEXA scan T-score -2.5, BMD 0.534 left forearm radius 33%, -3.8%.She is taking fosamax  70 mg 1 tablet by mouth once weekly. Due to update DEXA in October 2025.  Other medical conditions are listed as follows:  Essential hypertension: Blood pressure was elevated today in the office and was rechecked prior to leaving.  Patient was advised to monitor blood pressure closely and return to PCP if her blood pressure remains elevated.  History of MRSA infection  History of asthma  History of depression  History of hyperlipidemia  History of adenomatous polyp of colon  History of anxiety  Pedal edema  Orders: Orders Placed This Encounter  Procedures   CBC with Differential/Platelet   Comprehensive metabolic panel with GFR   Meds ordered this encounter  Medications   predniSONE  (DELTASONE ) 5 MG tablet    Sig: Take 4 tabs po x 4  days, 3  tabs po x 4 days, 2  tabs po x 4 days, 1  tab po x 4 days    Dispense:  40 tablet    Refill:  0     Follow-Up Instructions: Return in about 2 months (around 05/14/2024).   Waddell CHRISTELLA Craze, PA-C  Note - This record has been created using Dragon software.  Chart creation errors have been sought, but may not always  have been located. Such creation errors do not reflect on  the standard of medical care.

## 2024-03-13 NOTE — Patient Instructions (Addendum)
 Standing Labs We placed an order today for your standing lab work.   Please have your standing labs drawn in 1 month, 3 months, then every 5 months   Please have your labs drawn 2 weeks prior to your appointment so that the provider can discuss your lab results at your appointment, if possible.  Please note that you may see your imaging and lab results in MyChart before we have reviewed them. We will contact you once all results are reviewed. Please allow our office up to 72 hours to thoroughly review all of the results before contacting the office for clarification of your results.  WALK-IN LAB HOURS  Monday through Thursday from 8:00 am -12:30 pm and 1:00 pm-4:30 pm and Friday from 8:00 am-12:00 pm.  Patients with office visits requiring labs will be seen before walk-in labs.  You may encounter longer than normal wait times. Please allow additional time. Wait times may be shorter on  Monday and Thursday afternoons.  We do not book appointments for walk-in labs. We appreciate your patience and understanding with our staff.   Labs are drawn by [facility]. Please bring your co-pay at the time of your lab draw.  You may receive a bill from [facility] for your lab work.  Please note if you are on Hydroxychloroquine and and an order has been placed for a Hydroxychloroquine level,  you will need to have it drawn 4 hours or more after your last dose.  If you wish to have your labs drawn at another location, please call the office 24 hours in advance so we can fax the orders.  The office is located at [address], [place], [place] [address]   If you have any questions regarding directions or hours of operation,  please call [phone].   As a reminder, please drink plenty of water prior to coming for your lab work. Thanks!   Hydroxychloroquine Tablets What is this medication? HYDROXYCHLOROQUINE (hye drox ee KLOR oh kwin) treats autoimmune conditions, such as rheumatoid arthritis and  lupus. It works by slowing down an overactive immune system. It may also be used to prevent and treat malaria. It works by killing the parasite that causes malaria. It belongs to a group of medications called DMARDs. This medicine may be used for other purposes; ask your health care provider or pharmacist if you have questions. COMMON BRAND NAME(S): Plaquenil, Quineprox, SOVUNA What should I tell my care team before I take this medication? They need to know if you have any of these conditions: Diabetes Eye disease, vision problems Frequently drink alcohol G6PD deficiency Heart disease Irregular heartbeat or rhythm Kidney disease Liver disease Porphyria Psoriasis An unusual or allergic reaction to hydroxychloroquine, other medications, foods, dyes, or preservatives Pregnant or trying to get pregnant Breastfeeding How should I use this medication? Take this medication by mouth with water. Take it as directed on the prescription label. Do not cut, crush, or chew this medication. Swallow the tablets whole. Take it with food. Do not take it more than directed. Take all of this medication unless your care team tells you to stop it early. Keep taking it even if you think you are better. Take products with antacids in them at a different time of day than this medication. Take this medication 4 hours before or 4 hours after antacids. Talk to your care team if you have questions. Talk to your care team about the use of this medication in children. While this medication may be  prescribed for selected conditions, precautions do apply. Overdosage: If you think you have taken too much of this medicine contact a poison control center or emergency room at once. NOTE: This medicine is only for you. Do not share this medicine with others. What if I miss a dose? If you miss a dose, take it as soon as you can. If it is almost time for your next dose, take only that dose. Do not take double or extra doses. What  may interact with this medication? Do not take this medication with any of the following: Cisapride Dronedarone Pimozide Thioridazine This medication may also interact with the following: Ampicillin Antacids Cimetidine Cyclosporine Digoxin Kaolin Medications for diabetes, such as insulin , glipizide, glyburide Medications for seizures, such as carbamazepine, phenobarbital, phenytoin Mefloquine Methotrexate Other medications that cause heart rhythm changes Praziquantel This list may not describe all possible interactions. Give your health care provider a list of all the medicines, herbs, non-prescription drugs, or dietary supplements you use. Also tell them if you smoke, drink alcohol, or use illegal drugs. Some items may interact with your medicine. What should I watch for while using this medication? Visit your care team for regular checks on your progress. Tell your care team if your symptoms do not start to get better or if they get worse. You may need blood work done while you are taking this medication. If you take other medications that can affect heart rhythm, you may need more testing. Talk to your care team if you have questions. Your vision may be tested before and during use of this medication. Tell your care team right away if you have any change in your eyesight. This medication may cause serious skin reactions. They can happen weeks to months after starting the medication. Contact your care team right away if you notice fevers or flu-like symptoms with a rash. The rash may be red or purple and then turn into blisters or peeling of the skin. Or, you might notice a red rash with swelling of the face, lips or lymph nodes in your neck or under your arms. If you or your family notice any changes in your behavior, such as new or worsening depression, thoughts of harming yourself, anxiety, or other unusual or disturbing thoughts, or memory loss, call your care team right away. What  side effects may I notice from receiving this medication? Side effects that you should report to your care team as soon as possible: Allergic reactions--skin rash, itching, hives, swelling of the face, lips, tongue, or throat Aplastic anemia--unusual weakness or fatigue, dizziness, headache, trouble breathing, increased bleeding or bruising Change in vision Heart rhythm changes--fast or irregular heartbeat, dizziness, feeling faint or lightheaded, chest pain, trouble breathing Infection--fever, chills, cough, or sore throat Low blood sugar (hypoglycemia)--tremors or shaking, anxiety, sweating, cold or clammy skin, confusion, dizziness, rapid heartbeat Muscle injury--unusual weakness or fatigue, muscle pain, dark yellow or brown urine, decrease in amount of urine Pain, tingling, or numbness in the hands or feet Rash, fever, and swollen lymph nodes Redness, blistering, peeling, or loosening of the skin, including inside the mouth Thoughts of suicide or self-harm, worsening mood, or feelings of depression Unusual bruising or bleeding Side effects that usually do not require medical attention (report to your care team if they continue or are bothersome): Diarrhea Headache Nausea Stomach pain Vomiting This list may not describe all possible side effects. Call your doctor for medical advice about side effects. You may report side effects to FDA at 1-800-FDA-1088.  Where should I keep my medication? Keep out of the reach of children and pets. Store at room temperature up to 30 degrees C (86 degrees F). Protect from light. Get rid of any unused medication after the expiration date. To get rid of medications that are no longer needed or have expired: Take the medication to a medication take-back program. Check with your pharmacy or law enforcement to find a location. If you cannot return the medication, check the label or package insert to see if the medication should be thrown out in the garbage or  flushed down the toilet. If you are not sure, ask your care team. If it is safe to put it in the trash, empty the medication out of the container. Mix the medication with cat litter, dirt, coffee grounds, or other unwanted substance. Seal the mixture in a bag or container. Put it in the trash. NOTE: This sheet is a summary. It may not cover all possible information. If you have questions about this medicine, talk to your doctor, pharmacist, or health care provider.  2024 Elsevier/Gold Standard (2022-02-13 00:00:00)

## 2024-03-13 NOTE — Telephone Encounter (Signed)
 Patient scheduled 03/14/2023 with Waddell.

## 2024-03-14 ENCOUNTER — Other Ambulatory Visit: Payer: Self-pay | Admitting: *Deleted

## 2024-03-14 ENCOUNTER — Ambulatory Visit: Payer: Self-pay | Admitting: Physician Assistant

## 2024-03-14 MED ORDER — HYDROXYCHLOROQUINE SULFATE 200 MG PO TABS
200.0000 mg | ORAL_TABLET | Freq: Two times a day (BID) | ORAL | 0 refills | Status: DC
Start: 2024-03-14 — End: 2024-07-01

## 2024-03-14 NOTE — Progress Notes (Signed)
 Ok to send in plaquenil  as discussed at yesterday's office visit.

## 2024-03-14 NOTE — Progress Notes (Signed)
 CBC WNL Glucose is 119.  Calcium  is elevated- 10.7. please clarify if she has been taking any calcium  or vitamin D  supplements? rest of CMP WNL.

## 2024-03-15 NOTE — Assessment & Plan Note (Signed)
 Patient working with PT and incorporation of exercises that are tolerable with her with her pain and ability.  Follow up on physical activity ability at next appointment.

## 2024-03-15 NOTE — Assessment & Plan Note (Signed)
 Anthropometric Measurements Height: 5' 3 (1.6 m) Weight: 233 lb (105.7 kg) BMI (Calculated): 41.28 Weight at Last Visit: 238 lb Weight Lost Since Last Visit: 5 Weight Gained Since Last Visit: 0 Starting Weight: 267 lb Total Weight Loss (lbs): 34 lb (15.4 kg) Body Composition  Body Fat %: 44.3 % Fat Mass (lbs): 103.6 lbs Muscle Mass (lbs): 106.8 lbs Total Body Water  (lbs): 106.8 lbs Visceral Fat Rating : 16 Other Clinical Data Today's Visit #: 45 Starting Date: 10/31/18 Comments: 1200/75

## 2024-03-17 NOTE — Progress Notes (Signed)
 Inflammatory markers were not checked since she was already taking prednisone .  No signs of an infection based on CBC.

## 2024-03-17 NOTE — Telephone Encounter (Signed)
 Patient wants response sent to MyChart.  Patient wants to know if labs indicate a flare, an infection, does need to seen by PCP?

## 2024-04-24 NOTE — Progress Notes (Signed)
 Office Visit Note  Patient: [name]             Date of Birth: [birth year]           MRN: [id number]             PCP: Cook, Jayce G, DO Referring: Cook, Jayce G, DO Visit Date: 05/08/2024 Occupation: @GUAROCC @  Subjective:  Medication monitoring   History of Present Illness: [name] is a 73 y.o. female with history of rheumatoid arthritis. She was restarted on plaquenil  200 mg 1 tablet by mouth twice daily after the last office visit on 03/13/24.  She is tolerating Plaquenil  without any side effects.  Patient states that she recently traveled several doses of Plaquenil  but since being back has been taking it consistently.  Patient states that she has not noticed significant improvements in her symptoms since reinitiating Plaquenil  but is willing to give it more time.  Patient states that she continues to take Plaquenil  on a daily basis especially at bedtime for symptomatic relief.  Patient states that she has been able to start taking less Tylenol  since reinitiating Plaquenil .  She continues to have significant pain in both feet which contribute to issues with balance.  She is using a cane or a walking stick if she is walking prolonged distances to assist with ambulation.     Activities of Daily Living:  Patient reports morning stiffness for 1 hour.   Patient Reports nocturnal pain.  Difficulty dressing/grooming: Denies Difficulty climbing stairs: Denies Difficulty getting out of chair: Denies Difficulty using hands for taps, buttons, cutlery, and/or writing: Reports  Review of Systems  Constitutional:  Positive for fatigue.  HENT:  Positive for mouth dryness. Negative for mouth sores.   Eyes:  Positive for dryness.  Respiratory:  Negative for shortness of breath.   Cardiovascular:  Negative for chest pain and palpitations.  Gastrointestinal:  Negative for blood in stool, constipation and diarrhea.  Endocrine: Negative for increased urination.  Genitourinary:   Negative for involuntary urination.  Musculoskeletal:  Positive for joint pain, gait problem, joint pain, myalgias, morning stiffness and myalgias. Negative for joint swelling, muscle weakness and muscle tenderness.  Skin:  Positive for rash and sensitivity to sunlight. Negative for color change and hair loss.  Allergic/Immunologic: Positive for susceptible to infections.  Neurological:  Positive for headaches. Negative for dizziness.  Hematological:  Negative for swollen glands.  Psychiatric/Behavioral:  Positive for sleep disturbance. Negative for depressed mood. The patient is nervous/anxious.     PMFS History:  Patient Active Problem List   Diagnosis Date Noted   Rheumatoid arthritis flare (HCC) 03/04/2024   Morbid obesity (HCC) 01/15/2024   HTN (hypertension) 09/26/2023   Lipedema 01/19/2023   Coronary atherosclerosis 10/25/2022   Aortic atherosclerosis (HCC) 10/25/2022   Moderate aortic valve stenosis 06/29/2022   Osteoporosis 06/28/2022   Anxiety 06/28/2021   GERD (gastroesophageal reflux disease) 06/28/2021   History of total knee replacement, bilateral 10/30/2018   Seropositive rheumatoid arthritis of multiple sites (HCC) 07/20/2014   Obesity (BMI 30-39.9) 12/19/2007   DDD (degenerative disc disease), lumbar 06/27/2007   Hyperlipidemia 02/21/2007    Past Medical History:  Diagnosis Date   Allergic rhinitis    Allergy    Seasonal. Fall   Anxiety    Arthritis    Asthma    Constipation    Depression    Essential hypertension 04/04/2007   Gallbladder problem    Hematuria 08/25/2015   Hyperlipidemia    Hypertension  Impetigo    in nose per patient, dx by derm.   Obesity    Osteoarthritis    Osteoporosis 06/28/2022   Pelvic pain in female 08/25/2015   Rheumatoid arthritis (HCC)    Swallowing difficulty    Swelling     Family History  Problem Relation Age of Onset   Leukemia Mother    Hypertension Mother    Cancer Mother    Depression Mother    Early  death Mother    Miscarriages / Stillbirths Mother    Heart attack Father    Alcoholism Father    Depression Father    Heart disease Father    Hyperlipidemia Father    Hypertension Father    Cancer Sister    Obesity Sister    Other Sister        breathing problems   Hepatitis C Sister    Arthritis Sister    Depression Sister    Colon cancer Sister    Squamous cell carcinoma Sister    Obesity Sister    Cancer Sister    Arthritis Sister    Depression Sister    Squamous cell carcinoma Sister    Emphysema Brother    Other Brother        aneursym   Vision loss Maternal Aunt    Cancer Maternal Grandmother        breast   Obesity Maternal Grandmother    Obesity Daughter    Polycystic ovary syndrome Daughter    Depression Daughter    Obesity Son    Other Son        knee problems   Obesity Son    Asthma Son    Depression Son    Obesity Son    Depression Son    Obesity Son    Depression Son    Obesity Son    Diabetes Other        runs on dad's side of the family   Past Surgical History:  Procedure Laterality Date   BACK SURGERY     CHOLECYSTECTOMY     COLONOSCOPY  2007   sessile sigmoid polyp x 1; path: serrated adenoma   COLONOSCOPY N/A 10/12/2014   Procedure: COLONOSCOPY;  Surgeon: Margo LITTIE Haddock, MD;  Location: [facility];  Service: Endoscopy;  Laterality: N/A;   COLONOSCOPY N/A 03/05/2020   Procedure: COLONOSCOPY;  Surgeon: Shaaron Lamar HERO, MD;  Location: [facility];  Service: Endoscopy;  Laterality: N/A;  9:30   EYE SURGERY  2019   Cataract   JOINT REPLACEMENT     BIL knee    KNEE ARTHROPLASTY     KNEE SURGERY     bilateral knee replacement   LIPOMA EXCISION     x 2   SPINE SURGERY  2008   Rod L4-L5   TONSILLECTOMY AND ADENOIDECTOMY     Social History   Social History Narrative   Not on file   Immunization History  Administered Date(s) Administered   Fluad Quad(high Dose 65+) 05/27/2020, 05/12/2022   INFLUENZA, HIGH DOSE SEASONAL PF  06/14/2017, 06/18/2018   Influenza Split 06/17/2013   Influenza Whole 06/27/2007, 05/08/2008   Influenza,inj,Quad PF,6+ Mos 07/12/2015   Influenza-Unspecified 06/14/2017, 06/18/2018, 05/11/2019, 05/14/2021   PFIZER(Purple Top)SARS-COV-2 Vaccination 10/12/2019, 11/04/2019, 06/03/2020, 05/23/2021   PNEUMOCOCCAL CONJUGATE-20 12/26/2021, 06/05/2022   Pfizer(Comirnaty)Fall Seasonal Vaccine 12 years and older 05/12/2022   Pneumococcal Polysaccharide-23 02/18/1993, 06/27/2007   Zoster Recombinant(Shingrix) 08/19/2018, 06/05/2022     Objective: Vital Signs: BP 125/77  Pulse 69   Temp 97.7 F (36.5 C)   Resp 13   Ht 5' 3 (1.6 m)   Wt 242 lb 3.2 oz (109.9 kg)   BMI 42.90 kg/m    Physical Exam Vitals and nursing note reviewed.  Constitutional:      Appearance: She is well-developed.  HENT:     Head: Normocephalic and atraumatic.  Eyes:     Conjunctiva/sclera: Conjunctivae normal.  Cardiovascular:     Rate and Rhythm: Normal rate and regular rhythm.     Heart sounds: Murmur heard.  Pulmonary:     Effort: Pulmonary effort is normal.     Breath sounds: Normal breath sounds.  Abdominal:     General: Bowel sounds are normal.     Palpations: Abdomen is soft.  Musculoskeletal:     Cervical back: Normal range of motion.  Lymphadenopathy:     Cervical: No cervical adenopathy.  Skin:    General: Skin is warm and dry.     Capillary Refill: Capillary refill takes less than 2 seconds.  Neurological:     Mental Status: She is alert and oriented to person, place, and time.  Psychiatric:        Behavior: Behavior normal.      Musculoskeletal Exam: C-spine has limited range of motion.  Thoracic kyphosis noted.  Shoulder joints have good ROM. Synovial thickening and ulnar deviation of all MCP joints.  No tenderness or synovitis noted.  PIP and DIP thickening noted.  Hip joints have good range of motion with no groin pain.  Bilateral knee replacements have good range of motion with no  effusion.  Synovial thickening and tenderness of the left ankle.  Overcrowding of toes noted.  Tenderness of MTP joints.  CDAI Exam: CDAI Score: -- Patient Global: --; Provider Global: -- Swollen: --; Tender: -- Joint Exam 05/08/2024   No joint exam has been documented for this visit   There is currently no information documented on the homunculus. Go to the Rheumatology activity and complete the homunculus joint exam.  Investigation: No additional findings.  Imaging: No results found.  Recent Labs: Lab Results  Component Value Date   WBC 9.2 03/13/2024   HGB 15.5 03/13/2024   PLT 220 03/13/2024   NA 142 03/13/2024   K 4.1 03/13/2024   CL 101 03/13/2024   CO2 30 03/13/2024   GLUCOSE 119 (H) 03/13/2024   BUN 17 03/13/2024   CREATININE 0.93 03/13/2024   BILITOT 0.8 03/13/2024   ALKPHOS 64 12/21/2023   AST 17 03/13/2024   ALT 15 03/13/2024   PROT 7.0 03/13/2024   ALBUMIN 4.4 12/21/2023   CALCIUM  10.7 (H) 03/13/2024   GFRAA 73 07/23/2020   QFTBGOLDPLUS Negative 08/20/2023    Speciality Comments: PLQ Eye Exam: 12/03/2019 WNL @ [facility] Follow up in 1 year PLQ 02/22 MTX -dcd 09/19 Enbrel  start date 03/10/20-08/24  Procedures:  No procedures performed Allergies: Codeine, Cyclosporine, and Propylene glycol     Assessment / Plan:     Visit Diagnoses: Seropositive rheumatoid arthritis of multiple sites (HCC) - positive RF, positive anti-CCP. Previous patient of Dr. Everlean with multiple contractures, ultrasound of both hands on 10/30/18 revealed synovitis: Patient is taking Plaquenil  200 mg 1 tablet by mouth twice daily.  She is tolerating Plaquenil  without any side effects.  She has been able to reduce her Tylenol  intake since initiating Plaquenil  but has yet to notice the full benefit of Plaquenil  use.  She continues to have chronic pain  in both feet.  Warmth and tenderness of the left ankle noted.  Tenderness of all MTP joints noted.  She was willing to give  Plaquenil  more time and for us  to reassess for the full efficacy.  She will follow-up in the office in 3 months or sooner if needed.  High risk medication use - Plaquenil  200 mg 1 tablet by mouth twice daily CBC and CMP updated on 03/13/24. Orders for CBC and CMP released today.  Lipid panel updated on 12/21/23.  No updated plaquenil  eye examination on file.  . - Plan: CBC with Differential/Platelet, Comprehensive metabolic panel with GFR  History of total knee replacement, bilateral: Doing well.  No warmth or effusion noted.  Patient uses a cane or walking stick to assist with ambulation if needed.  Trochanteric bursitis of right hip: Not currently symptomatic.  Degeneration of intervertebral disc of lumbar region without discogenic back pain or lower extremity pain: Limited mobility.  No symptoms of radiculopathy.  Osteopenia of multiple sites - May 23, 2022 DEXA scan T-score -2.5, BMD 0.534 left forearm radius 33%, -3.8%. She is taking Fosamax  70 mg 1 tablet by mouth once weekly and vitamin D  5000 units daily. Due to update DEXA in October 2025.   Other medical conditions are listed as follows:  Essential hypertension: Blood pressure was 125/77 today in the office.  History of MRSA infection  History of asthma  History of depression  History of hyperlipidemia  History of adenomatous polyp of colon  History of anxiety  Pedal edema  Orders: Orders Placed This Encounter  Procedures   CBC with Differential/Platelet   Comprehensive metabolic panel with GFR   No orders of the defined types were placed in this encounter.   Follow-Up Instructions: Return in about 3 months (around 08/07/2024) for Rheumatoid arthritis.   Waddell Catherine Craze, PA-C  Note - This record has been created using Dragon software.  Chart creation errors have been sought, but may not always  have been located. Such creation errors do not reflect on  the standard of medical care.

## 2024-04-28 ENCOUNTER — Ambulatory Visit (INDEPENDENT_AMBULATORY_CARE_PROVIDER_SITE_OTHER): Admitting: Family Medicine

## 2024-04-28 ENCOUNTER — Encounter (INDEPENDENT_AMBULATORY_CARE_PROVIDER_SITE_OTHER): Payer: Self-pay | Admitting: Family Medicine

## 2024-04-28 VITALS — BP 124/81 | HR 61 | Temp 97.8°F | Ht 63.0 in | Wt 237.0 lb

## 2024-04-28 DIAGNOSIS — R609 Edema, unspecified: Secondary | ICD-10-CM | POA: Diagnosis not present

## 2024-04-28 DIAGNOSIS — Z6841 Body Mass Index (BMI) 40.0 and over, adult: Secondary | ICD-10-CM

## 2024-04-28 NOTE — Progress Notes (Signed)
   SUBJECTIVE:  Chief Complaint: Obesity  Interim History: Patient returns from recent 2 week long trip up to [place] .  Mentions she felt this went better than she expected overall.  She enjoyed her time on the beach of [facility] [place] .  Patient has been trying to get back into a routine since returning home.  She is back going to her class and voices that her son came back for about a week. Finally in the last week she feels that she has gotten into a norm.  She is off prednisone  now.  She is on plaquenil  now.  Her husband is normally figures out breakfast but she mentions she doesn't eat much during the day.  When she does eat during the day it tends to be not necessarily nutritious food.  [name] is here to discuss her progress with her obesity treatment plan. She is on the keeping a food journal and adhering to recommended goals of 1200 calories and 75 grams of protein and states she is following her eating plan approximately 50 % of the time. She states she is exercising 45-60 minutes 3 times per week.   OBJECTIVE: Visit Diagnoses: Problem List Items Addressed This Visit       Musculoskeletal and Integument   Lipedema - Primary     Other   Morbid obesity (HCC)   Other Visit Diagnoses       BMI 40.0-44.9, adult (HCC)           Vitals Temp: 97.8 F (36.6 C) BP: 124/81 Pulse Rate: 61 SpO2: 98 %   Anthropometric Measurements Height: 5' 3 (1.6 m) Weight: 237 lb (107.5 kg) BMI (Calculated): 41.99 Weight at Last Visit: 233 lb Weight Lost Since Last Visit: 0 Weight Gained Since Last Visit: 4 Starting Weight: 267 lb Total Weight Loss (lbs): 30 lb (13.6 kg)   Body Composition  Body Fat %: 55.7 % Fat Mass (lbs): 132.4 lbs Muscle Mass (lbs): 99.8 lbs Visceral Fat Rating : 20   Other Clinical Data Today's Visit #: 61 Starting Date: 10/31/18 Comments: 1200/75     ASSESSMENT AND PLAN:  Diet: Sahian is currently in the action stage of change. As such, her goal  is to continue with weight loss efforts and has agreed to keeping a food journal and adhering to recommended goals of 1200 calories and 75 or more grams protein daily.   Exercise:  Older adults should determine their level of effort for physical activity relative to their level of fitness.  Behavior Modification:  We discussed the following Behavioral Modification Strategies today: increasing lean protein intake, decreasing simple carbohydrates, increasing vegetables, meal planning and cooking strategies, and keeping healthy foods in the home.   Return in about 12 weeks (around 07/21/2024).   She was informed of the importance of frequent follow up visits to maximize her success with intensive lifestyle modifications for her multiple health conditions.  Attestation Statements:   Reviewed by clinician on day of visit: allergies, medications, problem list, medical history, surgical history, family history, social history, and previous encounter notes.     Catherine Cho, MD

## 2024-05-08 ENCOUNTER — Encounter: Payer: Self-pay | Admitting: Physician Assistant

## 2024-05-08 ENCOUNTER — Ambulatory Visit: Attending: Physician Assistant | Admitting: Physician Assistant

## 2024-05-08 VITALS — BP 125/77 | HR 69 | Temp 97.7°F | Resp 13 | Ht 63.0 in | Wt 242.2 lb

## 2024-05-08 DIAGNOSIS — M7061 Trochanteric bursitis, right hip: Secondary | ICD-10-CM | POA: Diagnosis not present

## 2024-05-08 DIAGNOSIS — M51369 Other intervertebral disc degeneration, lumbar region without mention of lumbar back pain or lower extremity pain: Secondary | ICD-10-CM | POA: Diagnosis not present

## 2024-05-08 DIAGNOSIS — Z8659 Personal history of other mental and behavioral disorders: Secondary | ICD-10-CM

## 2024-05-08 DIAGNOSIS — Z8639 Personal history of other endocrine, nutritional and metabolic disease: Secondary | ICD-10-CM

## 2024-05-08 DIAGNOSIS — Z8709 Personal history of other diseases of the respiratory system: Secondary | ICD-10-CM | POA: Diagnosis not present

## 2024-05-08 DIAGNOSIS — Z96653 Presence of artificial knee joint, bilateral: Secondary | ICD-10-CM | POA: Diagnosis not present

## 2024-05-08 DIAGNOSIS — M25512 Pain in left shoulder: Secondary | ICD-10-CM

## 2024-05-08 DIAGNOSIS — M25511 Pain in right shoulder: Secondary | ICD-10-CM | POA: Diagnosis not present

## 2024-05-08 DIAGNOSIS — R600 Localized edema: Secondary | ICD-10-CM

## 2024-05-08 DIAGNOSIS — M8589 Other specified disorders of bone density and structure, multiple sites: Secondary | ICD-10-CM | POA: Diagnosis not present

## 2024-05-08 DIAGNOSIS — Z860101 Personal history of adenomatous and serrated colon polyps: Secondary | ICD-10-CM

## 2024-05-08 DIAGNOSIS — I10 Essential (primary) hypertension: Secondary | ICD-10-CM

## 2024-05-08 DIAGNOSIS — Z8614 Personal history of Methicillin resistant Staphylococcus aureus infection: Secondary | ICD-10-CM | POA: Diagnosis not present

## 2024-05-08 DIAGNOSIS — Z79899 Other long term (current) drug therapy: Secondary | ICD-10-CM | POA: Diagnosis not present

## 2024-05-08 DIAGNOSIS — M0579 Rheumatoid arthritis with rheumatoid factor of multiple sites without organ or systems involvement: Secondary | ICD-10-CM | POA: Diagnosis not present

## 2024-05-08 DIAGNOSIS — G8929 Other chronic pain: Secondary | ICD-10-CM

## 2024-05-08 LAB — CBC WITH DIFFERENTIAL/PLATELET
Absolute Lymphocytes: 1859 {cells}/uL (ref 850–3900)
Absolute Monocytes: 502 {cells}/uL (ref 200–950)
Basophils Absolute: 41 {cells}/uL (ref 0–200)
Basophils Relative: 0.7 %
Eosinophils Absolute: 171 {cells}/uL (ref 15–500)
Eosinophils Relative: 2.9 %
HCT: 44.3 % (ref 35.0–45.0)
Hemoglobin: 14.4 g/dL (ref 11.7–15.5)
MCH: 31.9 pg (ref 27.0–33.0)
MCHC: 32.5 g/dL (ref 32.0–36.0)
MCV: 98 fL (ref 80.0–100.0)
MPV: 9.7 fL (ref 7.5–12.5)
Monocytes Relative: 8.5 %
Neutro Abs: 3328 {cells}/uL (ref 1500–7800)
Neutrophils Relative %: 56.4 %
Platelets: 171 Thousand/uL (ref 140–400)
RBC: 4.52 Million/uL (ref 3.80–5.10)
RDW: 12.8 % (ref 11.0–15.0)
Total Lymphocyte: 31.5 %
WBC: 5.9 Thousand/uL (ref 3.8–10.8)

## 2024-05-08 LAB — COMPREHENSIVE METABOLIC PANEL WITH GFR
AG Ratio: 2.1 (calc) (ref 1.0–2.5)
ALT: 14 U/L (ref 6–29)
AST: 18 U/L (ref 10–35)
Albumin: 4.4 g/dL (ref 3.6–5.1)
Alkaline phosphatase (APISO): 50 U/L (ref 37–153)
BUN: 13 mg/dL (ref 7–25)
CO2: 29 mmol/L (ref 20–32)
Calcium: 10.3 mg/dL (ref 8.6–10.4)
Chloride: 105 mmol/L (ref 98–110)
Creat: 0.84 mg/dL (ref 0.60–1.00)
Globulin: 2.1 g/dL (ref 1.9–3.7)
Glucose, Bld: 88 mg/dL (ref 65–99)
Potassium: 4.7 mmol/L (ref 3.5–5.3)
Sodium: 142 mmol/L (ref 135–146)
Total Bilirubin: 0.6 mg/dL (ref 0.2–1.2)
Total Protein: 6.5 g/dL (ref 6.1–8.1)
eGFR: 74 mL/min/1.73m2 (ref >=60)

## 2024-05-08 NOTE — Patient Instructions (Signed)
 Standing Labs We placed an order today for your standing lab work.   Please have your standing labs drawn in December and then every 5 months   Please have your labs drawn 2 weeks prior to your appointment so that the provider can discuss your lab results at your appointment, if possible.  Please note that you may see your imaging and lab results in MyChart before we have reviewed them. We will contact you once all results are reviewed. Please allow our office up to 72 hours to thoroughly review all of the results before contacting the office for clarification of your results.  WALK-IN LAB HOURS  Monday through Thursday from 8:00 am -12:30 pm and 1:00 pm-4:30 pm and Friday from 8:00 am-12:00 pm.  Patients with office visits requiring labs will be seen before walk-in labs.  You may encounter longer than normal wait times. Please allow additional time. Wait times may be shorter on  Monday and Thursday afternoons.  We do not book appointments for walk-in labs. We appreciate your patience and understanding with our staff.   Labs are drawn by [facility]. Please bring your co-pay at the time of your lab draw.  You may receive a bill from [facility] for your lab work.  Please note if you are on Hydroxychloroquine  and and an order has been placed for a Hydroxychloroquine  level,  you will need to have it drawn 4 hours or more after your last dose.  If you wish to have your labs drawn at another location, please call the office 24 hours in advance so we can fax the orders.  The office is located at [address], [place], [place] [address]   If you have any questions regarding directions or hours of operation,  please call [phone].   As a reminder, please drink plenty of water  prior to coming for your lab work. Thanks!

## 2024-05-09 ENCOUNTER — Ambulatory Visit: Payer: Self-pay | Admitting: Physician Assistant

## 2024-05-09 NOTE — Progress Notes (Signed)
 CBC and CMP WNL

## 2024-07-01 ENCOUNTER — Other Ambulatory Visit: Payer: Self-pay | Admitting: Rheumatology

## 2024-07-01 NOTE — Telephone Encounter (Signed)
 Last Fill: 03/14/2024  Eye exam: 12/03/2019- there is no updated PLQ eye exam on file. Patient restarted PLQ in July 2025.   Labs: 05/08/2024  CBC and CMP WNL   Next Visit: 08/06/2024  Last Visit: 05/08/2024  IK:Dzmnendpupcz rheumatoid arthritis of multiple sites   Current Dose per office note on 05/08/2024: Plaquenil  200 mg 1 tablet by mouth twice daily   Attempted to contact patient and left message to see if patient has updated PLQ eye exam.   Okay to refill Plaquenil ?

## 2024-07-03 ENCOUNTER — Ambulatory Visit: Admitting: Rheumatology

## 2024-07-07 ENCOUNTER — Telehealth: Payer: Self-pay | Admitting: Pharmacist

## 2024-07-07 DIAGNOSIS — E7849 Other hyperlipidemia: Secondary | ICD-10-CM

## 2024-07-07 MED ORDER — ATORVASTATIN CALCIUM 80 MG PO TABS: 80.0000 mg | ORAL_TABLET | Freq: Every day | ORAL | 3 refills | Status: AC

## 2024-07-07 NOTE — Telephone Encounter (Signed)
   This patient is appearing on a report for being at risk of failing the adherence measure for cholesterol (statin) medications this calendar year.     Contacted pharmacy to facilitate refills. and Will collaborate with provider to facilitate refill needs. Refills sent to [facility] Will follow up with patient to ensure filled/tolerating   Mliss Tarry Griffin, PharmD, BCACP, CPP Clinical Pharmacist, [facility]

## 2024-07-08 ENCOUNTER — Other Ambulatory Visit: Payer: Self-pay

## 2024-07-08 DIAGNOSIS — I872 Venous insufficiency (chronic) (peripheral): Secondary | ICD-10-CM

## 2024-07-10 ENCOUNTER — Ambulatory Visit (HOSPITAL_COMMUNITY)
Admission: RE | Admit: 2024-07-10 | Discharge: 2024-07-10 | Disposition: A | Discharge status: Home / Self Care | Source: Ambulatory Visit | Attending: Surgery | Admitting: Surgery

## 2024-07-10 DIAGNOSIS — I872 Venous insufficiency (chronic) (peripheral): Secondary | ICD-10-CM | POA: Insufficient documentation

## 2024-07-21 ENCOUNTER — Encounter (INDEPENDENT_AMBULATORY_CARE_PROVIDER_SITE_OTHER): Payer: Self-pay | Admitting: Family Medicine

## 2024-07-21 ENCOUNTER — Ambulatory Visit (INDEPENDENT_AMBULATORY_CARE_PROVIDER_SITE_OTHER): Payer: Self-pay | Admitting: Family Medicine

## 2024-07-21 VITALS — BP 143/80 | HR 66 | Temp 97.9°F | Ht 63.0 in | Wt 235.0 lb

## 2024-07-21 DIAGNOSIS — Z6841 Body Mass Index (BMI) 40.0 and over, adult: Secondary | ICD-10-CM

## 2024-07-21 DIAGNOSIS — E669 Obesity, unspecified: Secondary | ICD-10-CM

## 2024-07-21 DIAGNOSIS — R609 Edema, unspecified: Secondary | ICD-10-CM

## 2024-07-21 NOTE — Progress Notes (Unsigned)
   SUBJECTIVE:  Chief Complaint: Obesity  Interim History: Patient has been getting out and walking, visiting people, doing things.  She goes to her classes and is still maintaining social activities.  Food wise she has not been as strict as her husband over Thanksgiving.  She mentions she knowingly ate what she wanted to eat.  She is food logging and not necessarily logging everyday.  She is keeping track of her total intake.  [name] is here to discuss her progress with her obesity treatment plan. She is on the keeping a food journal and adhering to recommended goals of 1200 calories and 75 protein and states she is following her eating plan approximately 70 % of the time. She states she is exercising 6-8 hours per week.  OBJECTIVE: Visit Diagnoses: Problem List Items Addressed This Visit       Other   Morbid obesity (HCC)   Other Visit Diagnoses       BMI 40.0-44.9, adult (HCC)    -  Primary       Vitals Temp: 97.9 F (36.6 C) BP: (!) 143/80 Pulse Rate: 66 SpO2: 99 %   Anthropometric Measurements Height: 5' 3 (1.6 m) Weight: 235 lb (106.6 kg) BMI (Calculated): 41.64 Weight at Last Visit: 237 lb Weight Lost Since Last Visit: 2 Weight Gained Since Last Visit: 0 Starting Weight: 267 lb Total Weight Loss (lbs): 32 lb (14.5 kg)   Body Composition  Body Fat %: 54.5 % Fat Mass (lbs): 128.4 lbs Muscle Mass (lbs): 101.8 lbs Visceral Fat Rating : 19   Other Clinical Data Today's Visit #: 29 Starting Date: 10/31/18 Comments: 1200/75     ASSESSMENT AND PLAN: Assessment & Plan Lipedema Patient reports she has upcoming evaluation for her lipedema.  We discussed importance of asking questions and various options for treatment at that appointment.  Discussed possibility of compression for more comfort.  Upon reviewing patient's lower extremity vascular ultrasound there is reflux seen in the great saphenous vein down to the proximal calf but no reflux seen distal to  that.  Patient reports that she does have some compression stockings that go up to right below the knee but does experience some discomfort on the back of the knee due to the compression.  Will defer treatment to ordering provider and follow-up on plan of care at next appointment. BMI 40.0-44.9, adult (HCC)  Obesity with starting BMI of 47.3    Diet: [name] is currently in the action stage of change. As such, her goal is to continue with weight loss efforts and has agreed to keeping a food journal and adhering to recommended goals of 1200 calories and 75 or more grams of protein.   Exercise:  Older adults should determine their level of effort for physical activity relative to their level of fitness.  Behavior Modification:  We discussed the following Behavioral Modification Strategies today: decreasing simple carbohydrates, increasing vegetables, meal planning and cooking strategies, holiday eating strategies, planning for success, and keep a strict food journal.   Return in about 12 weeks (around 10/13/2024).   She was informed of the importance of frequent follow up visits to maximize her success with intensive lifestyle modifications for her multiple health conditions.  Attestation Statements:   Reviewed by clinician on day of visit: allergies, medications, problem list, medical history, surgical history, family history, social history, and previous encounter notes.     Catherine Cho, MD

## 2024-07-22 NOTE — Assessment & Plan Note (Signed)
 Patient reports she has upcoming evaluation for her lipedema.  We discussed importance of asking questions and various options for treatment at that appointment.  Discussed possibility of compression for more comfort.  Upon reviewing patient's lower extremity vascular ultrasound there is reflux seen in the great saphenous vein down to the proximal calf but no reflux seen distal to that.  Patient reports that she does have some compression stockings that go up to right below the knee but does experience some discomfort on the back of the knee due to the compression.  Will defer treatment to ordering provider and follow-up on plan of care at next appointment.

## 2024-07-23 NOTE — Progress Notes (Unsigned)
 Office Visit Note  Patient: [name]             Date of Birth: July 01, 1951           MRN: [id number]             PCP: Cook, Jayce G, DO Referring: Cook, Jayce G, DO Visit Date: 08/06/2024 Occupation: Data Unavailable  Subjective:  Medication monitoring   History of Present Illness: [name] is a 73 y.o. female with history of seropositive rheumatoid arthritis.  Patient remains on Plaquenil  200 mg 1 tablet by mouth twice daily.  She is tolerating Plaquenil  without any side effects and has not had any gaps in therapy.  Patient states that she feels that Plaquenil  has started to kick in and has been managing her symptoms.  Patient states that she has noticed over 50% improvement in her symptoms.  She has been performing hand exercises on a daily basis.  She has noticed less swelling in the right wrist. Patient states that she is scheduled for baseline Plaquenil  examination on 09/02/2024.  Activities of Daily Living:  Patient reports morning stiffness for 30 minutes.   Patient Denies nocturnal pain.  Difficulty dressing/grooming: Denies Difficulty climbing stairs: Denies Difficulty getting out of chair: Denies Difficulty using hands for taps, buttons, cutlery, and/or writing: Reports  Review of Systems  Constitutional:  Positive for fatigue.  HENT:  Positive for mouth dryness. Negative for mouth sores.   Eyes:  Positive for dryness.  Respiratory:  Negative for shortness of breath.   Cardiovascular:  Negative for chest pain and palpitations.  Gastrointestinal:  Negative for blood in stool, constipation and diarrhea.  Endocrine: Negative for increased urination.  Genitourinary:  Negative for involuntary urination.  Musculoskeletal:  Positive for joint pain, gait problem, joint pain, myalgias, morning stiffness, muscle tenderness and myalgias. Negative for joint swelling and muscle weakness.  Skin:  Positive for sensitivity to sunlight. Negative for color change, rash and  hair loss.  Allergic/Immunologic: Positive for susceptible to infections.  Neurological:  Negative for dizziness and headaches.  Hematological:  Negative for swollen glands.  Psychiatric/Behavioral:  Positive for depressed mood and sleep disturbance. The patient is nervous/anxious.     PMFS History:  Patient Active Problem List   Diagnosis Date Noted   Rheumatoid arthritis flare (HCC) 03/04/2024   Morbid obesity (HCC) 01/15/2024   HTN (hypertension) 09/26/2023   Lipedema 01/19/2023   Coronary atherosclerosis 10/25/2022   Aortic atherosclerosis 10/25/2022   Moderate aortic valve stenosis 06/29/2022   Osteoporosis 06/28/2022   Anxiety 06/28/2021   GERD (gastroesophageal reflux disease) 06/28/2021   History of total knee replacement, bilateral 10/30/2018   Seropositive rheumatoid arthritis of multiple sites (HCC) 07/20/2014   Obesity (BMI 30-39.9) 12/19/2007   DDD (degenerative disc disease), lumbar 06/27/2007   Hyperlipidemia 02/21/2007    Past Medical History:  Diagnosis Date   Allergic rhinitis    Allergy    Seasonal. Fall   Anxiety    Arthritis    Asthma    Constipation    Depression    Essential hypertension 04/04/2007   Gallbladder problem    Hematuria 08/25/2015   Hyperlipidemia    Hypertension    Impetigo    in nose per patient, dx by derm.   Obesity    Osteoarthritis    Osteoporosis 06/28/2022   Pelvic pain in female 08/25/2015   Rheumatoid arthritis (HCC)    Swallowing difficulty    Swelling     Family History  Problem Relation Age of Onset   Leukemia Mother    Hypertension Mother    Cancer Mother    Depression Mother    Early death Mother    Miscarriages / Stillbirths Mother    Heart attack Father    Alcoholism Father    Depression Father    Heart disease Father    Hyperlipidemia Father    Hypertension Father    Cancer Sister    Obesity Sister    Other Sister        breathing problems   Hepatitis C Sister    Arthritis Sister    Depression  Sister    Colon cancer Sister    Squamous cell carcinoma Sister    Obesity Sister    Cancer Sister    Arthritis Sister    Depression Sister    Squamous cell carcinoma Sister    Emphysema Brother    Other Brother        aneursym   Vision loss Maternal Aunt    Cancer Maternal Grandmother        breast   Obesity Maternal Grandmother    Obesity Daughter    Polycystic ovary syndrome Daughter    Depression Daughter    Obesity Son    Other Son        knee problems   Obesity Son    Asthma Son    Depression Son    Obesity Son    Depression Son    Obesity Son    Depression Son    Obesity Son    Diabetes Other        runs on dad's side of the family   Past Surgical History:  Procedure Laterality Date   BACK SURGERY     CHOLECYSTECTOMY     COLONOSCOPY  2007   sessile sigmoid polyp x 1; path: serrated adenoma   COLONOSCOPY N/A 10/12/2014   Procedure: COLONOSCOPY;  Surgeon: Margo LITTIE Haddock, MD;  Location: [facility];  Service: Endoscopy;  Laterality: N/A;   COLONOSCOPY N/A 03/05/2020   Procedure: COLONOSCOPY;  Surgeon: Shaaron Lamar HERO, MD;  Location: [facility];  Service: Endoscopy;  Laterality: N/A;  9:30   EYE SURGERY  2019   Cataract   JOINT REPLACEMENT     BIL knee    KNEE ARTHROPLASTY     KNEE SURGERY     bilateral knee replacement   LIPOMA EXCISION     x 2   SPINE SURGERY  2008   Rod L4-L5   TONSILLECTOMY AND ADENOIDECTOMY     Social History   Tobacco Use   Smoking status: Never    Passive exposure: Past   Smokeless tobacco: Never  Vaping Use   Vaping status: Never Used  Substance Use Topics   Alcohol use: Yes    Comment: 1 drink occasionally   Drug use: No   Social History   Social History Narrative   Not on file     Immunization History  Administered Date(s) Administered   Fluad Quad(high Dose 65+) 05/27/2020, 05/12/2022   INFLUENZA, HIGH DOSE SEASONAL PF 06/14/2017, 06/18/2018   Influenza Split 06/17/2013   Influenza Whole 06/27/2007,  05/08/2008   Influenza,inj,Quad PF,6+ Mos 07/12/2015   Influenza-Unspecified 06/14/2017, 06/18/2018, 05/11/2019, 05/14/2021   PFIZER(Purple Top)SARS-COV-2 Vaccination 10/12/2019, 11/04/2019, 06/03/2020, 05/23/2021   PNEUMOCOCCAL CONJUGATE-20 12/26/2021, 06/05/2022   Pfizer(Comirnaty)Fall Seasonal Vaccine 12 years and older 05/12/2022   Pneumococcal Polysaccharide-23 02/18/1993, 06/27/2007   Zoster Recombinant(Shingrix) 08/19/2018, 06/05/2022  Objective: Vital Signs: BP 118/81 (BP Location: Left Arm, Patient Position: Sitting, Cuff Size: Large)   Pulse 67   Temp (!) 97 F (36.1 C)   Resp 12   Ht 5' 4 (1.626 m)   Wt 242 lb 14.4 oz (110.2 kg)   BMI 41.69 kg/m    Physical Exam Vitals and nursing note reviewed.  Constitutional:      Appearance: She is well-developed.  HENT:     Head: Normocephalic and atraumatic.  Eyes:     Conjunctiva/sclera: Conjunctivae normal.  Cardiovascular:     Rate and Rhythm: Normal rate and regular rhythm.     Heart sounds: Murmur heard.  Pulmonary:     Effort: Pulmonary effort is normal.     Breath sounds: Normal breath sounds.  Abdominal:     General: Bowel sounds are normal.     Palpations: Abdomen is soft.  Musculoskeletal:     Cervical back: Normal range of motion.  Lymphadenopathy:     Cervical: No cervical adenopathy.  Skin:    General: Skin is warm and dry.     Capillary Refill: Capillary refill takes less than 2 seconds.  Neurological:     Mental Status: She is alert and oriented to person, place, and time.  Psychiatric:        Behavior: Behavior normal.      Musculoskeletal Exam: C-spine has limited ROM with lateral rotation.  Thoracic kyphosis.  Shoulder joints, elbow joints, wrist joints, MCPs, PIPs, DIPs have good range of motion with no synovitis.  Synovial thickening of all MCP joints with ulnar deviation.  PIP and DIP thickening noted.  Complete fist formation bilaterally.  Hip joints have good range of motion with no groin  pain.  Both knee replacements have good ROM.  Ankle joints have good range of motion no tenderness or joint swelling.     CDAI Exam: CDAI Score: -- Patient Global: --; Provider Global: -- Swollen: --; Tender: -- Joint Exam 08/06/2024   No joint exam has been documented for this visit   There is currently no information documented on the homunculus. Go to the Rheumatology activity and complete the homunculus joint exam.  Investigation: No additional findings.  Imaging: VAS US  LOWER EXTREMITY VENOUS REFLUX Result Date: 07/10/2024  Lower Venous Reflux Study Patient Name:  LERAE LANGHAM  Date of Exam:   07/10/2024 Medical Rec #: [id number]        Accession #:    [id number] Date of Birth: 1951/01/11        Patient Gender: F Patient Age:   35 years Exam Location:  [address] Procedure:      VAS US  LOWER EXTREMITY VENOUS REFLUX Referring Phys: DEBBY ROBERTSON --------------------------------------------------------------------------------  Indications: Edema.  Comparison Study: N/A Performing Technologist: Dena Pane  Examination Guidelines: A complete evaluation includes B-mode imaging, spectral Doppler, color Doppler, and power Doppler as needed of all accessible portions of each vessel. Bilateral testing is considered an integral part of a complete examination. Limited examinations for reoccurring indications may be performed as noted. The reflux portion of the exam is performed with the patient in reverse Trendelenburg. Significant venous reflux is defined as >500 ms in the superficial venous system, and >1 second in the deep venous system.  +--------------+---------+------+-----------+------------+--------+ LEFT          Reflux NoRefluxReflux TimeDiameter cmsComments                         Yes                                  +--------------+---------+------+-----------+------------+--------+  CFV           no                                              +--------------+---------+------+-----------+------------+--------+ FV prox       no                                             +--------------+---------+------+-----------+------------+--------+ FV mid        no                                             +--------------+---------+------+-----------+------------+--------+ FV dist       no                                             +--------------+---------+------+-----------+------------+--------+ Popliteal     no                                             +--------------+---------+------+-----------+------------+--------+ GSV at SFJ    no                            1.10             +--------------+---------+------+-----------+------------+--------+ GSV prox thigh          yes    >500 ms      0.77             +--------------+---------+------+-----------+------------+--------+ GSV mid thigh           yes    >500 ms      0.59             +--------------+---------+------+-----------+------------+--------+ GSV dist thigh          yes    >500 ms      0.44             +--------------+---------+------+-----------+------------+--------+ GSV at knee             yes    >500 ms      0.47             +--------------+---------+------+-----------+------------+--------+ GSV prox calf           yes    >500 ms      0.23             +--------------+---------+------+-----------+------------+--------+ GSV mid calf            yes    >500 ms      0.17             +--------------+---------+------+-----------+------------+--------+ GSV dist calf no                            0.21             +--------------+---------+------+-----------+------------+--------+ SSV prox calf no  0.15             +--------------+---------+------+-----------+------------+--------+ SSV mid calf  no                            0.21              +--------------+---------+------+-----------+------------+--------+   Summary: Left: - No evidence of deep vein thrombosis seen in the left lower extremity, from the common femoral through the popliteal veins. - No evidence of superficial venous thrombosis in the left lower extremity. - No evidence of superficial venous reflux seen in the left short saphenous vein. - Please see noted reflux above.  *See table(s) above for measurements and observations. Electronically signed by Debby Robertson on 07/10/2024 at 8:05:14 PM.    Final     Recent Labs: Lab Results  Component Value Date   WBC 5.9 05/08/2024   HGB 14.4 05/08/2024   PLT 171 05/08/2024   NA 142 05/08/2024   K 4.7 05/08/2024   CL 105 05/08/2024   CO2 29 05/08/2024   GLUCOSE 88 05/08/2024   BUN 13 05/08/2024   CREATININE 0.84 05/08/2024   BILITOT 0.6 05/08/2024   ALKPHOS 64 12/21/2023   AST 18 05/08/2024   ALT 14 05/08/2024   PROT 6.5 05/08/2024   ALBUMIN 4.4 12/21/2023   CALCIUM  10.3 05/08/2024   GFRAA 73 07/23/2020   QFTBGOLDPLUS Negative 08/20/2023    Speciality Comments: PLQ Eye Exam: 12/03/2019 WNL @ [facility] Follow up in 1 year PLQ 02/22 MTX -dcd 09/19 Enbrel  start date 03/10/20-08/24  Patient scheduled for Jan. 2026 for PLQ eye exam.   Procedures:  No procedures performed Allergies: Codeine, Cyclosporine, and Propylene glycol  Assessment / Plan:     Visit Diagnoses: Seropositive rheumatoid arthritis of multiple sites (HCC) - positive RF, positive anti-CCP. Previous patient of Dr. Everlean with multiple contractures, ultrasound of both hands on 10/30/18 revealed synovitis: She has noticed over a 50% improvement in her symptoms since reinitiating Plaquenil  200 mg 1 tablet by mouth twice daily.  She is tolerating Plaquenil  without any side effects and has not had any gaps in therapy.  She has had less severe joint pain and more ease performing hand exercises.  She will remain on Plaquenil  as monotherapy.  She was  advised to notify us  if she develops any new or worsening symptoms.  She will follow-up in the office in 5 months or sooner if needed.  High risk medication use - Plaquenil  200 mg 1 tablet by mouth twice daily.  CBC and CMP updated on 05/08/24.  She is scheduled for an updated Plaquenil  examination on 09/02/2024. - Plan: Comprehensive metabolic panel with GFR, CBC with Differential/Platelet  Chronic left shoulder pain: Good ROM with no discomfort.    Chronic right shoulder pain: Good ROM with no discomfort currently.   History of total knee replacement, bilateral: Doing well.  Good ROM with no warmth or effusion.   Trochanteric bursitis of right hip: Not currently symptomatic.    Degeneration of intervertebral disc of lumbar region without discogenic back pain or lower extremity pain: Intermittent discomfort.   Osteopenia of multiple sites - 05/23/22 DEXA scan T-score -2.5, BMD 0.534 left forearm radius 33%, -3.8%. She is taking Fosamax  70 mg 1 tablet by mouth once weekly and vitamin D  5000 units daily.   Overdue to update DEXA.   Other medical conditions are listed as follows:  Essential hypertension: Blood pressure was 118/81 today  in the office.  History of MRSA infection  History of asthma  History of depression  History of hyperlipidemia  History of adenomatous polyp of colon  History of anxiety  Pedal edema  Orders: Orders Placed This Encounter  Procedures   Comprehensive metabolic panel with GFR   CBC with Differential/Platelet   Meds ordered this encounter  Medications   hydroxychloroquine  (PLAQUENIL ) 200 MG tablet    Sig: Take 1 tablet (200 mg total) by mouth 2 (two) times daily.    Dispense:  60 tablet    Refill:  0    Follow-Up Instructions: Return in about 5 months (around 01/04/2025) for Rheumatoid arthritis.   Waddell CHRISTELLA Craze, PA-C  Note - This record has been created using Dragon software.  Chart creation errors have been sought, but may not always   have been located. Such creation errors do not reflect on  the standard of medical care.

## 2024-08-06 ENCOUNTER — Encounter: Payer: Self-pay | Admitting: Physician Assistant

## 2024-08-06 ENCOUNTER — Ambulatory Visit: Attending: Physician Assistant | Admitting: Physician Assistant

## 2024-08-06 VITALS — BP 118/81 | HR 67 | Temp 97.0°F | Resp 12 | Ht 64.0 in | Wt 242.9 lb

## 2024-08-06 DIAGNOSIS — Z79899 Other long term (current) drug therapy: Secondary | ICD-10-CM | POA: Diagnosis not present

## 2024-08-06 DIAGNOSIS — M25512 Pain in left shoulder: Secondary | ICD-10-CM

## 2024-08-06 DIAGNOSIS — Z8614 Personal history of Methicillin resistant Staphylococcus aureus infection: Secondary | ICD-10-CM | POA: Diagnosis not present

## 2024-08-06 DIAGNOSIS — I10 Essential (primary) hypertension: Secondary | ICD-10-CM | POA: Diagnosis not present

## 2024-08-06 DIAGNOSIS — M7061 Trochanteric bursitis, right hip: Secondary | ICD-10-CM | POA: Diagnosis not present

## 2024-08-06 DIAGNOSIS — M0579 Rheumatoid arthritis with rheumatoid factor of multiple sites without organ or systems involvement: Secondary | ICD-10-CM | POA: Diagnosis not present

## 2024-08-06 DIAGNOSIS — Z8709 Personal history of other diseases of the respiratory system: Secondary | ICD-10-CM

## 2024-08-06 DIAGNOSIS — G8929 Other chronic pain: Secondary | ICD-10-CM

## 2024-08-06 DIAGNOSIS — M51369 Other intervertebral disc degeneration, lumbar region without mention of lumbar back pain or lower extremity pain: Secondary | ICD-10-CM | POA: Diagnosis not present

## 2024-08-06 DIAGNOSIS — Z96653 Presence of artificial knee joint, bilateral: Secondary | ICD-10-CM | POA: Diagnosis not present

## 2024-08-06 DIAGNOSIS — R600 Localized edema: Secondary | ICD-10-CM

## 2024-08-06 DIAGNOSIS — Z8639 Personal history of other endocrine, nutritional and metabolic disease: Secondary | ICD-10-CM

## 2024-08-06 DIAGNOSIS — M25511 Pain in right shoulder: Secondary | ICD-10-CM | POA: Diagnosis not present

## 2024-08-06 DIAGNOSIS — M8589 Other specified disorders of bone density and structure, multiple sites: Secondary | ICD-10-CM

## 2024-08-06 DIAGNOSIS — Z8659 Personal history of other mental and behavioral disorders: Secondary | ICD-10-CM | POA: Diagnosis not present

## 2024-08-06 DIAGNOSIS — Z860101 Personal history of adenomatous and serrated colon polyps: Secondary | ICD-10-CM

## 2024-08-06 MED ORDER — HYDROXYCHLOROQUINE SULFATE 200 MG PO TABS: 200.0000 mg | ORAL_TABLET | Freq: Two times a day (BID) | ORAL | 0 refills | Status: AC

## 2024-08-07 ENCOUNTER — Ambulatory Visit: Payer: Self-pay | Admitting: Physician Assistant

## 2024-08-07 LAB — COMPREHENSIVE METABOLIC PANEL WITH GFR
AG Ratio: 1.8 (calc) (ref 1.0–2.5)
ALT: 13 U/L (ref 6–29)
AST: 23 U/L (ref 10–35)
Albumin: 4.4 g/dL (ref 3.6–5.1)
Alkaline phosphatase (APISO): 51 U/L (ref 37–153)
BUN: 13 mg/dL (ref 7–25)
CO2: 30 mmol/L (ref 20–32)
Calcium: 10.3 mg/dL (ref 8.6–10.4)
Chloride: 104 mmol/L (ref 98–110)
Creat: 0.91 mg/dL (ref 0.60–1.00)
Globulin: 2.5 g/dL (ref 1.9–3.7)
Glucose, Bld: 76 mg/dL (ref 65–99)
Potassium: 4.3 mmol/L (ref 3.5–5.3)
Sodium: 142 mmol/L (ref 135–146)
Total Bilirubin: 0.8 mg/dL (ref 0.2–1.2)
Total Protein: 6.9 g/dL (ref 6.1–8.1)
eGFR: 67 mL/min/1.73m2 (ref >=60)

## 2024-08-07 LAB — CBC WITH DIFFERENTIAL/PLATELET
Absolute Lymphocytes: 1990 {cells}/uL (ref 850–3900)
Absolute Monocytes: 499 {cells}/uL (ref 200–950)
Basophils Absolute: 19 {cells}/uL (ref 0–200)
Basophils Relative: 0.3 %
Eosinophils Absolute: 122 {cells}/uL (ref 15–500)
Eosinophils Relative: 1.9 %
HCT: 45.2 % (ref 35.9–46.0)
Hemoglobin: 14.9 g/dL (ref 11.7–15.5)
MCH: 32 pg (ref 27.0–33.0)
MCHC: 33 g/dL (ref 31.6–35.4)
MCV: 97 fL (ref 81.4–101.7)
MPV: 9.7 fL (ref 7.5–12.5)
Monocytes Relative: 7.8 %
Neutro Abs: 3770 {cells}/uL (ref 1500–7800)
Neutrophils Relative %: 58.9 %
Platelets: 174 Thousand/uL (ref 140–400)
RBC: 4.66 Million/uL (ref 3.80–5.10)
RDW: 12.6 % (ref 11.0–15.0)
Total Lymphocyte: 31.1 %
WBC: 6.4 Thousand/uL (ref 3.8–10.8)

## 2024-08-07 NOTE — Progress Notes (Signed)
 CBC and CMP WNL

## 2024-08-15 ENCOUNTER — Other Ambulatory Visit (HOSPITAL_COMMUNITY): Payer: Self-pay | Admitting: Family Medicine

## 2024-08-15 DIAGNOSIS — Z1231 Encounter for screening mammogram for malignant neoplasm of breast: Secondary | ICD-10-CM

## 2024-08-22 ENCOUNTER — Ambulatory Visit (HOSPITAL_COMMUNITY)
Admission: RE | Admit: 2024-08-22 | Discharge: 2024-08-22 | Disposition: A | Discharge status: Home / Self Care | Source: Ambulatory Visit | Attending: Family Medicine | Admitting: Family Medicine

## 2024-08-22 DIAGNOSIS — Z1231 Encounter for screening mammogram for malignant neoplasm of breast: Secondary | ICD-10-CM | POA: Insufficient documentation

## 2024-09-02 ENCOUNTER — Ambulatory Visit: Attending: Vascular Surgery | Admitting: Physician Assistant

## 2024-09-02 ENCOUNTER — Encounter (HOSPITAL_COMMUNITY)

## 2024-09-02 VITALS — BP 137/81 | HR 76 | Temp 97.7°F | Wt 239.2 lb

## 2024-09-02 DIAGNOSIS — I872 Venous insufficiency (chronic) (peripheral): Secondary | ICD-10-CM

## 2024-09-02 NOTE — Progress Notes (Signed)
 " Office Note     CC:  follow up Requesting Provider:  Blinda Katz, DPM  HPI: [name] is a 74 y.o. (1951-04-23) female who presents for evaluation of edema and skin changes of the left lower extremity more than right.  She will occasionally wear compression socks that are knee-high in length.  She elevates her legs at the end of the day to help manage symptoms.  She has osteoarthritis in her knees and hips which affects her mobility.  She tries to exercise on a regular basis.  She denies any history of DVT, venous ulcerations, trauma, or prior vascular intervention.  She does not take any blood thinners.  She denies tobacco use.  She takes a statin daily.   Past Medical History:  Diagnosis Date   Allergic rhinitis    Allergy    Seasonal. Fall   Anxiety    Arthritis    Asthma    Constipation    Depression    Essential hypertension 04/04/2007   Gallbladder problem    Hematuria 08/25/2015   Hyperlipidemia    Hypertension    Impetigo    in nose per patient, dx by derm.   Obesity    Osteoarthritis    Osteoporosis 06/28/2022   Pelvic pain in female 08/25/2015   Rheumatoid arthritis (HCC)    Swallowing difficulty    Swelling     Past Surgical History:  Procedure Laterality Date   BACK SURGERY     CHOLECYSTECTOMY     COLONOSCOPY  2007   sessile sigmoid polyp x 1; path: serrated adenoma   COLONOSCOPY N/A 10/12/2014   Procedure: COLONOSCOPY;  Surgeon: Margo LITTIE Haddock, MD;  Location: [facility];  Service: Endoscopy;  Laterality: N/A;   COLONOSCOPY N/A 03/05/2020   Procedure: COLONOSCOPY;  Surgeon: Shaaron Lamar HERO, MD;  Location: [facility];  Service: Endoscopy;  Laterality: N/A;  9:30   EYE SURGERY  2019   Cataract   JOINT REPLACEMENT     BIL knee    KNEE ARTHROPLASTY     KNEE SURGERY     bilateral knee replacement   LIPOMA EXCISION     x 2   SPINE SURGERY  2008   Rod L4-L5   TONSILLECTOMY AND ADENOIDECTOMY      Social History   Socioeconomic  History   Marital status: Married    Spouse name: [name]   Number of children: 4   Years of education: Not on file   Highest education level: Master's degree (e.g., MA, MS, MEng, MEd, MSW, MBA)  Occupational History   Occupation: Stay at home  Tobacco Use   Smoking status: Never    Passive exposure: Past   Smokeless tobacco: Never  Vaping Use   Vaping status: Never Used  Substance and Sexual Activity   Alcohol use: Yes    Comment: 1 drink occasionally   Drug use: No   Sexual activity: Yes    Birth control/protection: Post-menopausal  Other Topics Concern   Not on file  Social History Narrative   Not on file   Social Drivers of Health   Tobacco Use: Low Risk (09/02/2024)   Patient History    Smoking Tobacco Use: Never    Smokeless Tobacco Use: Never    Passive Exposure: Past  Financial Resource Strain: Low Risk (11/30/2023)   Overall Financial Resource Strain (CARDIA)    Difficulty of Paying Living Expenses: Not very hard  Food Insecurity: No Food Insecurity (11/30/2023)  Hunger Vital Sign    Worried About Running Out of Food in the Last Year: Never true    Ran Out of Food in the Last Year: Never true  Transportation Needs: No Transportation Needs (11/30/2023)   PRAPARE - Administrator, Civil Service (Medical): No    Lack of Transportation (Non-Medical): No  Physical Activity: Sufficiently Active (11/30/2023)   Exercise Vital Sign    Days of Exercise per Week: 3 days    Minutes of Exercise per Session: 50 min  Stress: Stress Concern Present (11/30/2023)   [employer] of Occupational Health - Occupational Stress Questionnaire    Feeling of Stress : Very much  Social Connections: Socially Integrated (11/30/2023)   Social Connection and Isolation Panel    Frequency of Communication with Friends and Family: More than three times a week    Frequency of Social Gatherings with Friends and Family: Three times a week    Attends Religious Services: More  than 4 times per year    Active Member of Clubs or Organizations: Yes    Attends Banker Meetings: More than 4 times per year    Marital Status: Married  Catering Manager Violence: Not At Risk (12/07/2023)   Humiliation, Afraid, Rape, and Kick questionnaire    Fear of Current or Ex-Partner: No    Emotionally Abused: No    Physically Abused: No    Sexually Abused: No  Depression (PHQ2-9): Low Risk (03/04/2024)   Depression (PHQ2-9)    PHQ-2 Score: 4  Recent Concern: Depression (PHQ2-9) - High Risk (12/07/2023)   Depression (PHQ2-9)    PHQ-2 Score: 11  Alcohol Screen: Low Risk (11/30/2023)   Alcohol Screen    Last Alcohol Screening Score (AUDIT): 1  Housing: Low Risk (11/30/2023)   Housing Stability Vital Sign    Unable to Pay for Housing in the Last Year: No    Number of Times Moved in the Last Year: 0    Homeless in the Last Year: No  Utilities: Not At Risk (12/07/2023)   A[employer] Utilities    Threatened with loss of utilities: No  Health Literacy: Adequate Health Literacy (12/07/2023)   B1300 Health Literacy    Frequency of need for help with medical instructions: Never    Family History  Problem Relation Age of Onset   Leukemia Mother    Hypertension Mother    Cancer Mother    Depression Mother    Early death Mother    Miscarriages / Stillbirths Mother    Heart attack Father    Alcoholism Father    Depression Father    Heart disease Father    Hyperlipidemia Father    Hypertension Father    Cancer Sister    Obesity Sister    Other Sister        breathing problems   Hepatitis C Sister    Arthritis Sister    Depression Sister    Colon cancer Sister    Squamous cell carcinoma Sister    Obesity Sister    Cancer Sister    Arthritis Sister    Depression Sister    Squamous cell carcinoma Sister    Emphysema Brother    Other Brother        aneursym   Vision loss Maternal Aunt    Cancer Maternal Grandmother        breast   Obesity Maternal Grandmother     Obesity Daughter    Polycystic ovary syndrome Daughter  Depression Daughter    Obesity Son    Other Son        knee problems   Obesity Son    Asthma Son    Depression Son    Obesity Son    Depression Son    Obesity Son    Depression Son    Obesity Son    Diabetes Other        runs on dad's side of the family    Current Outpatient Medications  Medication Sig Dispense Refill   acetaminophen  (TYLENOL ) 500 MG tablet Take 500-1,000 mg by mouth every 6 (six) hours as needed (pain).      alendronate  (FOSAMAX ) 70 MG tablet Take 1 tablet (70 mg total) by mouth every 7 (seven) days. Take with a full glass of water  on an empty stomach. 12 tablet 3   atorvastatin  (LIPITOR) 80 MG tablet Take 1 tablet (80 mg total) by mouth daily. 90 tablet 3   B Complex Vitamins (VITAMIN B COMPLEX) TABS Take 1 tablet by mouth at bedtime.      busPIRone  (BUSPAR ) 7.5 MG tablet Take 1 tablet (7.5 mg total) by mouth 2 (two) times daily. 180 tablet 3   Cholecalciferol (VITAMIN D ) 125 MCG (5000 UT) CAPS Take 5,000 Units by mouth daily after breakfast.     ezetimibe  (ZETIA ) 10 MG tablet TAKE 1 TABLET BY MOUTH EVERY DAY 90 tablet 3   famotidine  (PEPCID ) 40 MG tablet TAKE 1 TABLET BY MOUTH DAILY AS NEEDED (Patient taking differently: Take 20 mg by mouth daily as needed.) 90 tablet 3   hydroxychloroquine  (PLAQUENIL ) 200 MG tablet Take 1 tablet (200 mg total) by mouth 2 (two) times daily. 60 tablet 0   lisinopril  (ZESTRIL ) 2.5 MG tablet Take 1 tablet (2.5 mg total) by mouth daily. 90 tablet 3   Magnesium 500 MG CAPS Take 500 mg by mouth.     Probiotic Product (ALIGN PO) Take 1 capsule by mouth daily.      Propylene Glycol (SYSTANE BALANCE OP) Place 1 drop into both eyes 4 (four) times daily as needed (dry/irritated eyes.).      traMADol  (ULTRAM ) 50 MG tablet TAKE 1-2 TABLETS EVERY 8 (EIGHT) HOURS AS NEEDED FOR MODERATE PAIN OR SEVERE PAIN. 30 tablet 0   No current facility-administered medications for this visit.     Allergies[1]   REVIEW OF SYSTEMS:  Negative unless noted in HPI [X]  denotes positive finding, [ ]  denotes negative finding Cardiac  Comments:  Chest pain or chest pressure:    Shortness of breath upon exertion:    Short of breath when lying flat:    Irregular heart rhythm:        Vascular    Pain in calf, thigh, or hip brought on by ambulation:    Pain in feet at night that wakes you up from your sleep:     Blood clot in your veins:    Leg swelling:         Pulmonary    Oxygen at home:    Productive cough:     Wheezing:         Neurologic    Sudden weakness in arms or legs:     Sudden numbness in arms or legs:     Sudden onset of difficulty speaking or slurred speech:    Temporary loss of vision in one eye:     Problems with dizziness:         Gastrointestinal    Blood  in stool:     Vomited blood:         Genitourinary    Burning when urinating:     Blood in urine:        Psychiatric    Major depression:         Hematologic    Bleeding problems:    Problems with blood clotting too easily:        Skin    Rashes or ulcers:        Constitutional    Fever or chills:      PHYSICAL EXAMINATION:  Vitals:   09/02/24 1332  BP: 137/81  Pulse: 76  Temp: 97.7 F (36.5 C)  TempSrc: Temporal  Weight: 239 lb 3.2 oz (108.5 kg)    General:  WDWN in NAD; vital signs documented above Gait: Not observed HENT: WNL, normocephalic Pulmonary: normal non-labored breathing Cardiac: regular HR Abdomen: soft, NT, no masses Skin: without rashes Vascular Exam/Pulses: palpable DP pulses Extremities: Varicosity along the left medial thigh; stasis pigmentation changes especially left lower leg versus right; no open wounds; no significant edema of the lower legs Musculoskeletal: no muscle wasting or atrophy  Neurologic: A&O X 3 Psychiatric:  The pt has Normal affect.   Non-Invasive Vascular Imaging:    Left lower extremity venous reflux study negative for  DVT Negative for deep venous reflux Incompetent GSV in the left leg from the thigh to the knee however no reflux noted in the saphenofemoral junction    ASSESSMENT/PLAN:: 74 y.o. female here for evaluation of bilateral lower extremity edema left worse than right  Ms. Wanamaker is a 74 year old female with chronic venous insufficiency.  On exam there is no significant difference from the left compared to right as far as swelling.  She does have slightly more stasis pigmentation changes of her left lower leg compared to the right however does not have any open wounds or history of venous ulcerations.  Left lower extremity venous reflux study was negative for DVT.  She was also negative for deep venous reflux.  She does have an incompetent GSV however reflux does not involve the saphenofemoral junction.  I do not believe she would be a good candidate for laser ablation therapy given symptoms in both legs as well as lack of reflux in the left saphenofemoral junction.  Recommendations included regular use of knee-high compression socks, proper leg elevation, avoidance of prolonged sitting and standing, and weight loss.  She was provided a brochure to the elastic outlet in [place] to see if she can find a more comfortable compression stocking.  She can follow-up on an as-needed basis.   Catherine Sender, PA-C [facility] [phone]  Clinic MD:   [name]     [1]  Allergies Allergen Reactions   Codeine Nausea Only   Cyclosporine Swelling   Propylene Glycol Nausea And Vomiting   "

## 2024-09-04 ENCOUNTER — Ambulatory Visit (HOSPITAL_COMMUNITY)

## 2024-09-23 ENCOUNTER — Other Ambulatory Visit: Payer: Self-pay | Admitting: Family Medicine

## 2024-09-23 DIAGNOSIS — F419 Anxiety disorder, unspecified: Secondary | ICD-10-CM

## 2024-09-24 ENCOUNTER — Other Ambulatory Visit

## 2024-09-25 ENCOUNTER — Other Ambulatory Visit

## 2024-09-30 ENCOUNTER — Ambulatory Visit

## 2024-10-13 ENCOUNTER — Ambulatory Visit (INDEPENDENT_AMBULATORY_CARE_PROVIDER_SITE_OTHER): Admitting: Family Medicine

## 2024-11-06 ENCOUNTER — Ambulatory Visit: Admitting: Rheumatology

## 2024-12-01 ENCOUNTER — Ambulatory Visit: Admitting: Internal Medicine

## 2024-12-12 ENCOUNTER — Ambulatory Visit

## 2025-01-13 ENCOUNTER — Ambulatory Visit: Admitting: Rheumatology
